# Patient Record
Sex: Female | Born: 1960
Health system: Southern US, Community
[De-identification: ages and names within clinical notes are randomized; demographics above are authoritative.]

## PROBLEM LIST (undated history)

## (undated) ENCOUNTER — Emergency Department (HOSPITAL_COMMUNITY): Discharge status: Absent without leave | Payer: Medicare Other

## (undated) DIAGNOSIS — R112 Nausea with vomiting, unspecified: Secondary | ICD-10-CM

## (undated) DIAGNOSIS — J189 Pneumonia, unspecified organism: Secondary | ICD-10-CM

## (undated) DIAGNOSIS — F32A Depression, unspecified: Secondary | ICD-10-CM

## (undated) DIAGNOSIS — E7800 Pure hypercholesterolemia, unspecified: Secondary | ICD-10-CM

## (undated) DIAGNOSIS — M1990 Unspecified osteoarthritis, unspecified site: Secondary | ICD-10-CM

## (undated) DIAGNOSIS — F419 Anxiety disorder, unspecified: Secondary | ICD-10-CM

## (undated) DIAGNOSIS — D649 Anemia, unspecified: Secondary | ICD-10-CM

## (undated) DIAGNOSIS — I10 Essential (primary) hypertension: Secondary | ICD-10-CM

## (undated) DIAGNOSIS — G8929 Other chronic pain: Secondary | ICD-10-CM

## (undated) DIAGNOSIS — K219 Gastro-esophageal reflux disease without esophagitis: Secondary | ICD-10-CM

## (undated) DIAGNOSIS — F329 Major depressive disorder, single episode, unspecified: Secondary | ICD-10-CM

## (undated) DIAGNOSIS — D860 Sarcoidosis of lung: Secondary | ICD-10-CM

## (undated) HISTORY — PX: ABDOMINAL HYSTERECTOMY: SHX81

## (undated) HISTORY — DX: Anxiety disorder, unspecified: F41.9

## (undated) HISTORY — DX: Pure hypercholesterolemia, unspecified: E78.00

## (undated) HISTORY — DX: Gastro-esophageal reflux disease without esophagitis: K21.9

## (undated) HISTORY — PX: CHOLECYSTECTOMY: SHX55

## (undated) HISTORY — PX: HERNIA REPAIR: SHX51

## (undated) HISTORY — DX: Pneumonia, unspecified organism: J18.9

## (undated) HISTORY — PX: SMALL INTESTINE SURGERY: SHX150

## (undated) HISTORY — DX: Sarcoidosis of lung: D86.0

## (undated) HISTORY — PX: APPENDECTOMY: SHX54

---
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
## 2001-01-19 ENCOUNTER — Emergency Department (HOSPITAL_COMMUNITY): Admission: EM | Admit: 2001-01-19 | Discharge: 2001-01-19 | Payer: Self-pay | Admitting: *Deleted

## 2001-04-17 ENCOUNTER — Emergency Department (HOSPITAL_COMMUNITY): Admission: EM | Admit: 2001-04-17 | Discharge: 2001-04-17 | Payer: Self-pay | Admitting: *Deleted

## 2001-04-21 ENCOUNTER — Emergency Department (HOSPITAL_COMMUNITY): Admission: EM | Admit: 2001-04-21 | Discharge: 2001-04-21 | Payer: Self-pay | Admitting: Emergency Medicine

## 2001-04-27 ENCOUNTER — Emergency Department (HOSPITAL_COMMUNITY): Admission: EM | Admit: 2001-04-27 | Discharge: 2001-04-27 | Payer: Self-pay | Admitting: *Deleted

## 2001-05-18 ENCOUNTER — Emergency Department (HOSPITAL_COMMUNITY): Admission: EM | Admit: 2001-05-18 | Discharge: 2001-05-18 | Payer: Self-pay | Admitting: Emergency Medicine

## 2001-05-28 ENCOUNTER — Encounter: Payer: Self-pay | Admitting: Internal Medicine

## 2001-05-28 ENCOUNTER — Emergency Department (HOSPITAL_COMMUNITY): Admission: EM | Admit: 2001-05-28 | Discharge: 2001-05-28 | Payer: Self-pay | Admitting: Internal Medicine

## 2001-06-08 ENCOUNTER — Emergency Department (HOSPITAL_COMMUNITY): Admission: EM | Admit: 2001-06-08 | Discharge: 2001-06-08 | Payer: Self-pay | Admitting: Emergency Medicine

## 2001-07-15 ENCOUNTER — Emergency Department (HOSPITAL_COMMUNITY): Admission: EM | Admit: 2001-07-15 | Discharge: 2001-07-15 | Payer: Self-pay | Admitting: Emergency Medicine

## 2001-08-14 ENCOUNTER — Emergency Department (HOSPITAL_COMMUNITY): Admission: EM | Admit: 2001-08-14 | Discharge: 2001-08-14 | Payer: Self-pay | Admitting: Emergency Medicine

## 2001-09-21 ENCOUNTER — Encounter: Payer: Self-pay | Admitting: Emergency Medicine

## 2001-09-21 ENCOUNTER — Emergency Department (HOSPITAL_COMMUNITY): Admission: EM | Admit: 2001-09-21 | Discharge: 2001-09-21 | Payer: Self-pay | Admitting: Emergency Medicine

## 2002-06-15 ENCOUNTER — Emergency Department (HOSPITAL_COMMUNITY): Admission: EM | Admit: 2002-06-15 | Discharge: 2002-06-15 | Payer: Self-pay | Admitting: *Deleted

## 2002-06-15 ENCOUNTER — Encounter: Payer: Self-pay | Admitting: *Deleted

## 2002-06-23 ENCOUNTER — Emergency Department (HOSPITAL_COMMUNITY): Admission: EM | Admit: 2002-06-23 | Discharge: 2002-06-23 | Payer: Self-pay | Admitting: Emergency Medicine

## 2002-06-23 ENCOUNTER — Encounter: Payer: Self-pay | Admitting: Emergency Medicine

## 2002-07-01 ENCOUNTER — Emergency Department (HOSPITAL_COMMUNITY): Admission: EM | Admit: 2002-07-01 | Discharge: 2002-07-01 | Payer: Self-pay | Admitting: Emergency Medicine

## 2002-07-02 ENCOUNTER — Emergency Department (HOSPITAL_COMMUNITY): Admission: EM | Admit: 2002-07-02 | Discharge: 2002-07-02 | Payer: Self-pay | Admitting: Emergency Medicine

## 2002-07-02 ENCOUNTER — Encounter: Payer: Self-pay | Admitting: Emergency Medicine

## 2002-08-16 ENCOUNTER — Encounter: Payer: Self-pay | Admitting: Emergency Medicine

## 2002-08-16 ENCOUNTER — Emergency Department (HOSPITAL_COMMUNITY): Admission: EM | Admit: 2002-08-16 | Discharge: 2002-08-16 | Payer: Self-pay | Admitting: Emergency Medicine

## 2002-09-20 ENCOUNTER — Encounter (HOSPITAL_COMMUNITY): Admission: RE | Admit: 2002-09-20 | Discharge: 2002-10-20 | Payer: Self-pay | Admitting: Pulmonary Disease

## 2002-12-24 ENCOUNTER — Emergency Department (HOSPITAL_COMMUNITY): Admission: EM | Admit: 2002-12-24 | Discharge: 2002-12-24 | Payer: Self-pay | Admitting: Emergency Medicine

## 2002-12-24 ENCOUNTER — Encounter: Payer: Self-pay | Admitting: Emergency Medicine

## 2003-06-03 ENCOUNTER — Emergency Department (HOSPITAL_COMMUNITY): Admission: EM | Admit: 2003-06-03 | Discharge: 2003-06-03 | Payer: Self-pay | Admitting: Emergency Medicine

## 2003-06-23 ENCOUNTER — Ambulatory Visit (HOSPITAL_COMMUNITY): Admission: RE | Admit: 2003-06-23 | Discharge: 2003-06-23 | Payer: Self-pay | Admitting: Obstetrics and Gynecology

## 2003-07-08 ENCOUNTER — Ambulatory Visit (HOSPITAL_COMMUNITY): Admission: RE | Admit: 2003-07-08 | Discharge: 2003-07-08 | Payer: Self-pay | Admitting: Obstetrics and Gynecology

## 2003-08-22 ENCOUNTER — Emergency Department (HOSPITAL_COMMUNITY): Admission: EM | Admit: 2003-08-22 | Discharge: 2003-08-23 | Payer: Self-pay | Admitting: *Deleted

## 2003-09-07 ENCOUNTER — Emergency Department (HOSPITAL_COMMUNITY): Admission: EM | Admit: 2003-09-07 | Discharge: 2003-09-07 | Payer: Self-pay | Admitting: Emergency Medicine

## 2003-10-30 ENCOUNTER — Emergency Department (HOSPITAL_COMMUNITY): Admission: EM | Admit: 2003-10-30 | Discharge: 2003-10-30 | Payer: Self-pay | Admitting: Emergency Medicine

## 2003-12-15 ENCOUNTER — Ambulatory Visit (HOSPITAL_COMMUNITY): Admission: RE | Admit: 2003-12-15 | Discharge: 2003-12-15 | Payer: Self-pay | Admitting: Pulmonary Disease

## 2003-12-26 ENCOUNTER — Ambulatory Visit (HOSPITAL_COMMUNITY): Admission: RE | Admit: 2003-12-26 | Discharge: 2003-12-26 | Payer: Self-pay | Admitting: Pulmonary Disease

## 2004-03-31 ENCOUNTER — Emergency Department (HOSPITAL_COMMUNITY): Admission: EM | Admit: 2004-03-31 | Discharge: 2004-03-31 | Payer: Self-pay | Admitting: Emergency Medicine

## 2004-06-16 ENCOUNTER — Emergency Department (HOSPITAL_COMMUNITY): Admission: EM | Admit: 2004-06-16 | Discharge: 2004-06-16 | Payer: Self-pay | Admitting: Emergency Medicine

## 2004-06-17 ENCOUNTER — Emergency Department (HOSPITAL_COMMUNITY): Admission: EM | Admit: 2004-06-17 | Discharge: 2004-06-17 | Payer: Self-pay | Admitting: Emergency Medicine

## 2004-07-09 ENCOUNTER — Emergency Department (HOSPITAL_COMMUNITY): Admission: EM | Admit: 2004-07-09 | Discharge: 2004-07-09 | Payer: Self-pay | Admitting: Emergency Medicine

## 2004-07-20 ENCOUNTER — Inpatient Hospital Stay (HOSPITAL_COMMUNITY): Admission: EM | Admit: 2004-07-20 | Discharge: 2004-07-23 | Payer: Self-pay | Admitting: Emergency Medicine

## 2004-08-13 ENCOUNTER — Emergency Department (HOSPITAL_COMMUNITY): Admission: EM | Admit: 2004-08-13 | Discharge: 2004-08-13 | Payer: Self-pay | Admitting: Emergency Medicine

## 2004-11-09 ENCOUNTER — Emergency Department (HOSPITAL_COMMUNITY): Admission: EM | Admit: 2004-11-09 | Discharge: 2004-11-09 | Payer: Self-pay | Admitting: *Deleted

## 2005-01-08 ENCOUNTER — Emergency Department (HOSPITAL_COMMUNITY): Admission: EM | Admit: 2005-01-08 | Discharge: 2005-01-09 | Payer: Self-pay | Admitting: Emergency Medicine

## 2005-01-09 ENCOUNTER — Emergency Department (HOSPITAL_COMMUNITY): Admission: EM | Admit: 2005-01-09 | Discharge: 2005-01-09 | Payer: Self-pay | Admitting: *Deleted

## 2005-10-26 ENCOUNTER — Emergency Department (HOSPITAL_COMMUNITY): Admission: EM | Admit: 2005-10-26 | Discharge: 2005-10-26 | Payer: Self-pay | Admitting: Emergency Medicine

## 2006-03-03 ENCOUNTER — Emergency Department (HOSPITAL_COMMUNITY): Admission: EM | Admit: 2006-03-03 | Discharge: 2006-03-03 | Payer: Self-pay | Admitting: Emergency Medicine

## 2006-03-04 ENCOUNTER — Inpatient Hospital Stay (HOSPITAL_COMMUNITY): Admission: EM | Admit: 2006-03-04 | Discharge: 2006-03-09 | Payer: Self-pay | Admitting: Emergency Medicine

## 2006-12-12 ENCOUNTER — Ambulatory Visit (HOSPITAL_COMMUNITY): Admission: RE | Admit: 2006-12-12 | Discharge: 2006-12-12 | Payer: Self-pay | Admitting: Pulmonary Disease

## 2006-12-25 ENCOUNTER — Emergency Department (HOSPITAL_COMMUNITY): Admission: EM | Admit: 2006-12-25 | Discharge: 2006-12-25 | Payer: Self-pay | Admitting: Emergency Medicine

## 2007-03-29 ENCOUNTER — Emergency Department (HOSPITAL_COMMUNITY): Admission: EM | Admit: 2007-03-29 | Discharge: 2007-03-29 | Payer: Self-pay | Admitting: Emergency Medicine

## 2007-04-13 ENCOUNTER — Emergency Department (HOSPITAL_COMMUNITY): Admission: EM | Admit: 2007-04-13 | Discharge: 2007-04-13 | Payer: Self-pay | Admitting: Emergency Medicine

## 2007-04-18 ENCOUNTER — Emergency Department (HOSPITAL_COMMUNITY): Admission: EM | Admit: 2007-04-18 | Discharge: 2007-04-18 | Payer: Self-pay | Admitting: Emergency Medicine

## 2007-09-02 ENCOUNTER — Emergency Department (HOSPITAL_COMMUNITY): Admission: EM | Admit: 2007-09-02 | Discharge: 2007-09-02 | Payer: Self-pay | Admitting: Emergency Medicine

## 2007-09-21 ENCOUNTER — Emergency Department (HOSPITAL_COMMUNITY): Admission: EM | Admit: 2007-09-21 | Discharge: 2007-09-21 | Payer: Self-pay | Admitting: Emergency Medicine

## 2009-04-03 ENCOUNTER — Encounter (INDEPENDENT_AMBULATORY_CARE_PROVIDER_SITE_OTHER): Payer: Self-pay | Admitting: *Deleted

## 2009-04-03 LAB — CONVERTED CEMR LAB
ALT: 13 units/L
AST: 17 units/L
Bilirubin, Direct: 0.1 mg/dL
Cholesterol: 230 mg/dL
Creatinine, Ser: 0.59 mg/dL
HDL: 35 mg/dL
LDL Cholesterol: 155 mg/dL

## 2009-06-23 DIAGNOSIS — J189 Pneumonia, unspecified organism: Secondary | ICD-10-CM | POA: Insufficient documentation

## 2009-06-23 DIAGNOSIS — R079 Chest pain, unspecified: Secondary | ICD-10-CM | POA: Insufficient documentation

## 2009-06-23 DIAGNOSIS — D869 Sarcoidosis, unspecified: Secondary | ICD-10-CM | POA: Insufficient documentation

## 2009-06-26 ENCOUNTER — Ambulatory Visit: Payer: Self-pay | Admitting: Cardiology

## 2009-06-26 ENCOUNTER — Encounter (INDEPENDENT_AMBULATORY_CARE_PROVIDER_SITE_OTHER): Payer: Self-pay | Admitting: *Deleted

## 2009-06-26 DIAGNOSIS — K219 Gastro-esophageal reflux disease without esophagitis: Secondary | ICD-10-CM | POA: Insufficient documentation

## 2009-07-05 ENCOUNTER — Ambulatory Visit: Payer: Self-pay | Admitting: Cardiology

## 2009-07-07 ENCOUNTER — Encounter: Payer: Self-pay | Admitting: Cardiology

## 2009-07-07 ENCOUNTER — Ambulatory Visit: Payer: Self-pay | Admitting: Cardiology

## 2009-07-07 ENCOUNTER — Ambulatory Visit (HOSPITAL_COMMUNITY): Admission: RE | Admit: 2009-07-07 | Discharge: 2009-07-07 | Payer: Self-pay | Admitting: Cardiology

## 2009-07-11 ENCOUNTER — Encounter (INDEPENDENT_AMBULATORY_CARE_PROVIDER_SITE_OTHER): Payer: Self-pay | Admitting: *Deleted

## 2009-08-14 ENCOUNTER — Emergency Department (HOSPITAL_COMMUNITY): Admission: EM | Admit: 2009-08-14 | Discharge: 2009-08-14 | Payer: Self-pay | Admitting: Emergency Medicine

## 2009-11-09 ENCOUNTER — Encounter (HOSPITAL_COMMUNITY)
Admission: RE | Admit: 2009-11-09 | Discharge: 2009-12-09 | Payer: Self-pay | Source: Home / Self Care | Admitting: Pulmonary Disease

## 2010-01-16 ENCOUNTER — Emergency Department (HOSPITAL_COMMUNITY): Admission: EM | Admit: 2010-01-16 | Discharge: 2010-01-17 | Payer: Self-pay | Admitting: Emergency Medicine

## 2010-04-07 ENCOUNTER — Emergency Department (HOSPITAL_COMMUNITY)
Admission: EM | Admit: 2010-04-07 | Discharge: 2010-04-07 | Payer: Self-pay | Source: Home / Self Care | Admitting: Emergency Medicine

## 2010-05-13 ENCOUNTER — Observation Stay (HOSPITAL_COMMUNITY): Admission: EM | Admit: 2010-05-13 | Discharge: 2010-05-15 | Payer: Self-pay | Admitting: Emergency Medicine

## 2010-09-02 ENCOUNTER — Encounter: Payer: Self-pay | Admitting: Pulmonary Disease

## 2010-10-24 LAB — BASIC METABOLIC PANEL
BUN: 4 mg/dL — ABNORMAL LOW (ref 6–23)
Chloride: 102 mEq/L (ref 96–112)
Potassium: 3.5 mEq/L (ref 3.5–5.1)
Sodium: 134 mEq/L — ABNORMAL LOW (ref 135–145)

## 2010-10-24 LAB — URINE CULTURE
Colony Count: 75000
Culture  Setup Time: 201110030322
Special Requests: NEGATIVE

## 2010-10-24 LAB — URINALYSIS, ROUTINE W REFLEX MICROSCOPIC
Bilirubin Urine: NEGATIVE
Glucose, UA: NEGATIVE mg/dL
Ketones, ur: NEGATIVE mg/dL
pH: 6.5 (ref 5.0–8.0)

## 2010-10-24 LAB — DIFFERENTIAL
Basophils Absolute: 0.5 10*3/uL — ABNORMAL HIGH (ref 0.0–0.1)
Basophils Relative: 0 % (ref 0–1)
Eosinophils Absolute: 3.4 10*3/uL — ABNORMAL HIGH (ref 0.0–0.7)
Lymphocytes Relative: 22 % (ref 12–46)
Monocytes Absolute: 11.3 10*3/uL — ABNORMAL HIGH (ref 0.1–1.0)
Neutrophils Relative %: 63 % (ref 43–77)

## 2010-10-24 LAB — CULTURE, BLOOD (ROUTINE X 2)
Culture: NO GROWTH
Culture: NO GROWTH
Report Status: 10082011
Report Status: 10082011

## 2010-10-24 LAB — ANA: Anti Nuclear Antibody(ANA): NEGATIVE

## 2010-10-24 LAB — CBC
HCT: 34.7 % — ABNORMAL LOW (ref 36.0–46.0)
Hemoglobin: 11.8 g/dL — ABNORMAL LOW (ref 12.0–15.0)
MCV: 84.1 fL (ref 78.0–100.0)
RDW: 15.2 % (ref 11.5–15.5)
WBC: 3.9 10*3/uL — ABNORMAL LOW (ref 4.0–10.5)

## 2010-10-25 LAB — BASIC METABOLIC PANEL
BUN: 7 mg/dL (ref 6–23)
Creatinine, Ser: 0.68 mg/dL (ref 0.4–1.2)
GFR calc non Af Amer: >60 mL/min (ref >60)
Glucose, Bld: 109 mg/dL — ABNORMAL HIGH (ref 70–99)

## 2010-10-25 LAB — DIFFERENTIAL
Basophils Absolute: 0 10*3/uL (ref 0.0–0.1)
Basophils Relative: 0 % (ref 0–1)
Eosinophils Relative: 5 % (ref 0–5)
Lymphocytes Relative: 20 % (ref 12–46)
Monocytes Absolute: 0.4 10*3/uL (ref 0.1–1.0)
Neutro Abs: 3.2 10*3/uL (ref 1.7–7.7)

## 2010-10-25 LAB — CBC
HCT: 37.2 % (ref 36.0–46.0)
MCHC: 33 g/dL (ref 30.0–36.0)
MCV: 83.9 fL (ref 78.0–100.0)
Platelets: 238 10*3/uL (ref 150–400)
RDW: 14.4 % (ref 11.5–15.5)
WBC: 4.8 10*3/uL (ref 4.0–10.5)

## 2010-10-25 LAB — POCT CARDIAC MARKERS: Troponin i, poc: <0.05 ng/mL (ref 0.00–0.09)

## 2010-10-27 LAB — DIFFERENTIAL
Eosinophils Absolute: 0.1 10*3/uL (ref 0.0–0.7)
Eosinophils Relative: 1 % (ref 0–5)
Lymphs Abs: 0.4 10*3/uL — ABNORMAL LOW (ref 0.7–4.0)
Monocytes Relative: 4 % (ref 3–12)
Neutrophils Relative %: 90 % — ABNORMAL HIGH (ref 43–77)

## 2010-10-27 LAB — COMPREHENSIVE METABOLIC PANEL
ALT: 43 U/L — ABNORMAL HIGH (ref 0–35)
AST: 44 U/L — ABNORMAL HIGH (ref 0–37)
Albumin: 4.2 g/dL (ref 3.5–5.2)
Calcium: 10.2 mg/dL (ref 8.4–10.5)
GFR calc Af Amer: >60 mL/min (ref >60)
Sodium: 140 mEq/L (ref 135–145)
Total Protein: 9.8 g/dL — ABNORMAL HIGH (ref 6.0–8.3)

## 2010-10-27 LAB — PREGNANCY, URINE: Preg Test, Ur: NEGATIVE

## 2010-10-27 LAB — URINALYSIS, ROUTINE W REFLEX MICROSCOPIC
Bilirubin Urine: NEGATIVE
Nitrite: NEGATIVE
Specific Gravity, Urine: 1.025 (ref 1.005–1.030)
pH: 5 (ref 5.0–8.0)

## 2010-10-27 LAB — CBC
MCHC: 33.3 g/dL (ref 30.0–36.0)
Platelets: 286 10*3/uL (ref 150–400)
RDW: 15.5 % (ref 11.5–15.5)

## 2010-10-29 LAB — BASIC METABOLIC PANEL
CO2: 26 mEq/L (ref 19–32)
Calcium: 9.2 mg/dL (ref 8.4–10.5)
GFR calc Af Amer: >60 mL/min (ref >60)
GFR calc non Af Amer: >60 mL/min (ref >60)
Sodium: 140 mEq/L (ref 135–145)

## 2010-10-29 LAB — URINALYSIS, ROUTINE W REFLEX MICROSCOPIC
Hgb urine dipstick: NEGATIVE
Nitrite: NEGATIVE
Specific Gravity, Urine: 1.02 (ref 1.005–1.030)
Urobilinogen, UA: 1 mg/dL (ref 0.0–1.0)

## 2010-10-29 LAB — CBC
Hemoglobin: 11.9 g/dL — ABNORMAL LOW (ref 12.0–15.0)
RBC: 4.18 MIL/uL (ref 3.87–5.11)
WBC: 4.2 10*3/uL (ref 4.0–10.5)

## 2010-10-29 LAB — DIFFERENTIAL
Lymphocytes Relative: 23 % (ref 12–46)
Lymphs Abs: 1 10*3/uL (ref 0.7–4.0)
Monocytes Absolute: 0.4 10*3/uL (ref 0.1–1.0)
Monocytes Relative: 10 % (ref 3–12)
Neutro Abs: 2.6 10*3/uL (ref 1.7–7.7)

## 2010-12-28 NOTE — Group Therapy Note (Signed)
Allison, Chase             ACCOUNT NO.:  [id number]   MEDICAL RECORD NO.:  [id number]          PATIENT TYPE:  INP   LOCATION:  A206                          FACILITY:  [facility]   PHYSICIAN:  Edward L. Juanetta Gosling, M.D.DATE OF BIRTH:  10-11-1960   DATE OF PROCEDURE:  DATE OF DISCHARGE:                                   PROGRESS NOTE   PROBLEM:  Pneumonia, sarcoidosis.   SUBJECTIVE:  [name] says she feels a little better.  She is still  congested, still has significant chest pain but the chest pain has been in  part of her syndrome ever since she started.  This has been since her  diagnosis of sarcoidosis.   PHYSICAL EXAMINATION:  GENERAL:  Her physical exam today shows she looks  more comfortable.  VITAL SIGNS:  Temperature is 98.2, pulse 101, respirations 20, blood  pressure 158/99, O2 sat is 99% on 1 liter.  CHEST:  Much clearer but she has still got complaints of pain.  HEART:  Regular.  ABDOMEN:  Soft.   ASSESSMENT:  She is better.   PLAN:  I am going to discontinue her IV fluids, get her up and moving more.  She should be ready for discharge home tomorrow.      Edward L. Juanetta Gosling, M.D.  Electronically Signed     ELH/MEDQ  D:  03/08/2006  T:  03/08/2006  Job:  [id number]

## 2010-12-28 NOTE — Group Therapy Note (Signed)
[name], [name]             ACCOUNT NO.:  [id number]   MEDICAL RECORD NO.:  [id number]          PATIENT TYPE:  INP   LOCATION:  A206                          FACILITY:  [facility]   PHYSICIAN:  Edward L. Juanetta Gosling, M.D.DATE OF BIRTH:  08-Apr-1961   DATE OF PROCEDURE:  DATE OF DISCHARGE:                                   PROGRESS NOTE   PROBLEM:  Pneumonia, sarcoidosis.   SUBJECTIVE:  [name] was admitted last night with pneumonia which is  bilateral, and sarcoidosis.  She had had a previous ER visit about 24 hours  prior.   Her exam this morning shows that she is awake and alert.  She does not  appear to be in acute distress.  Temperature is 96.9, pulse 80, respirations  22, blood pressure 143/73, O2 saturation listed at 96% on room air but 97%  on 2 L; I am not sure what which one is accurate.  White blood count was  4700, hemoglobin 11.3.  D-dimer was 1.48.  Electrolytes:  Sodium was 133,  BUN 5, creatinine 0.8.  cardiac enzymes were negative.  Her chest is  relatively clear.  Her heart is regular without gallop.  Her abdomen soft.   ASSESSMENT:  She has sarcoidosis and pneumonia.   PLAN:  Continue with treatments.  I want to check and make sure that she did  have a CT chest done to check out the D-dimer elevation.   ADDENDUM:  She did have CT angiogram done on July 23 which showed no  pulmonary emboli, stable enlarged mediastinal lymph nodes, scattered  atelectasis.      Edward L. Juanetta Gosling, M.D.  Electronically Signed     ELH/MEDQ  D:  03/05/2006  T:  03/05/2006  Job:  [id number]

## 2010-12-28 NOTE — Op Note (Signed)
NAMELACHRISHA, [name]             ACCOUNT NO.:  [id number]   MEDICAL RECORD NO.:  [id number]          PATIENT TYPE:  INP   LOCATION:  A337                          FACILITY:  [facility]   PHYSICIAN:  Dirk Dress. Katrinka Blazing, M.D.   DATE OF BIRTH:  10-Jul-1961   DATE OF PROCEDURE:  07/20/2004  DATE OF DISCHARGE:                                 OPERATIVE REPORT   PREOPERATIVE DIAGNOSIS:  Cholelithiasis, cholecystitis.   POSTOPERATIVE DIAGNOSIS:  Cholelithiasis, cholecystitis.   PROCEDURE:  Laparoscopic cholecystectomy.   SURGEON:  Dr. Katrinka Blazing.   DESCRIPTION:  Under general endotracheal anesthesia, the patient's abdomen  was prepped and draped in sterile field. Supraumbilical incision was made.  Veress needle was inserted uneventfully. Abdomen was insufflated with 2.5  liters of CO2. Using a Visiport guide, a 10-mm port was placed. Laparoscope  was placed, and the gallbladder was visualized. The patient was placed in  reversed Trendelenburg position. Under videoscopic guidance, a 10-mm port  and two 5-mm ports were placed in the right subcostal region. The  gallbladder was grasped and positioned. Cystic duct was dissected along with  the cystic artery branches. There was an anterior and posterior cystic  artery branch. The anterior cystic artery branch was dissected back to the  gallbladder, clipped with 3 clips and divided. Cystic duct was then  dissected back to the gallbladder, clipped with 5 clips and divided. The  posterior cystic artery branch was dissected close to the gallbladder,  clipped with 3 clips and divided. The gallbladder was then placed on  _______________. Using electrocautery, gallbladder was separated from the  intrahepatic space without difficulty. The gallbladder was placed in an  EndoCatch device and retrieved intact. There were multiple gallstones.  Irrigation was carried out. There was essentially no bleeding. Hemostasis in  the bed was totally normal. There was no evidence  of bile leak. Half liter  of irrigation was carried, and the fluid was totally clear. CO2 was allowed  to escape from the abdomen, and the ports were removed. The incisions were  closed using 0 Vicryl on the supraumbilical incision and staples on the  skin. Dressings were placed. She was awakened from anesthesia uneventfully,  transferred to a bed, and taken to the post anesthetic care unit for further  monitoring.     Lero  LCS/MEDQ  D:  07/20/2004  T:  07/20/2004  Job:  [id number]   cc:   Ramon Dredge L. Juanetta Gosling, M.D.  [address]  [place]  [place] [address]  Fax: [phone]

## 2010-12-28 NOTE — Discharge Summary (Signed)
[name], [name]             ACCOUNT NO.:  [id number]   MEDICAL RECORD NO.:  [id number]          PATIENT TYPE:  INP   LOCATION:  A206                          FACILITY:  [facility]   PHYSICIAN:  Mila Homer. Sudie Bailey, M.D.DATE OF BIRTH:  12/02/1960   DATE OF ADMISSION:  03/04/2006  DATE OF DISCHARGE:  07/29/2007LH                                 DISCHARGE SUMMARY   HISTORY OF PRESENT ILLNESS:  This 50 year old was admitted to the hospital  with bilateral pneumonia and sarcoidosis.   HOSPITAL COURSE:  She had a fairly benign 6-day hospital course extending  from March 04, 2006 through March 09, 2006.  Vital signs remained stable.   Her admission white cell count was 6000.  There were 68% neutrophils, 19  lymphs, 9 monos.  Hemoglobin and hematocrit of 11.8 and 34.3.  Rechecked the  following day, the white count was 4700 with 59% neutrophils, 28 lymphs, 10  monos.  Hemoglobin and hematocrit was still low at 11.3 and 32.3.  Her  admission CMP showed a glucose of 108, total protein 8.4, and otherwise was  normal.  D-dimer was 1.48.  (Normal values 0-0.48).  Cardiac markers were  normal, and rechecked BMP showed a sodium of 133, BUN 5.   Her admission portable chest showed cardiomegaly without congestive heart  failure and bibasilar atelectasis.  She had a CT angiogram of the chest,  given her elevated D-dimer, and this showed no evidence of pulmonary  embolism but scattered atelectasis and scarring in both lower lobes, and  stable mildly enlarged mediastinal lymph nodes.  Recheck chest x-ray showed  worsened lower infiltrates consistent with pneumonia by the following day.   She was admitted to the hospital.  She was put on an IV at half normal  saline KVO.  Vital signs q.i.d.  Rocephin 1 gm IV daily, Zithromax 5 mg IV  daily, and Dilaudid 2 mg IV q.3-4h. for pain, Elavil 50 mg q.h.s. p.r.n.,  prednisone 60 mg daily.  She was given Benadryl 25 mg q.4h. p.r.n. itching.  Rocephin was  discontinued, and she was put on Levaquin 5 mg IV daily,  Humibid LA two b.i.d., Tylox for pain.  She received ibuprofen 800 mg q.8h.,  Lovenox 40 mg b.i.d. prophylactically, prednisone 40 mg daily by her third  day.  By her fifth day, her IV was discontinued and she was put on Heplock.  Ibuprofen had already been discontinued.   The patient did well with this regimen.  She was much improved by the time  of discharge.  She still had some pains in the anterior chest extending  around to the left upper quadrant, but these are the ones she has had ever  since she has had sarcoidosis.   FINAL DISCHARGE DIAGNOSES:  1. Pneumonia.  2. Sarcoidosis.  3. Chronic chest pain.   DISCHARGE MEDICATIONS:  1. She was discharged and to presume her prior home medicines.  2. She is to take Levaquin 5 mg daily for 7 days.   FOLLOWUP:  She is to followup with Dr. Juanetta Gosling.      Mila Homer. Sudie Bailey, M.D.  Electronically Signed     SDK/MEDQ  D:  03/09/2006  T:  03/09/2006  Job:  [id number]   cc:   Ramon Dredge L. Juanetta Gosling, M.D.  Fax: [phone]

## 2010-12-28 NOTE — Discharge Summary (Signed)
[name], [name]             ACCOUNT NO.:  [id number]   MEDICAL RECORD NO.:  [id number]          PATIENT TYPE:  INP   LOCATION:  A337                          FACILITY:  [facility]   PHYSICIAN:  Dirk Dress. Katrinka Blazing, M.D.   DATE OF BIRTH:  18-Nov-1960   DATE OF ADMISSION:  07/20/2004  DATE OF DISCHARGE:  12/12/2005LH                                 DISCHARGE SUMMARY   DISCHARGE DIAGNOSES:  1.  Cholelithiasis/cholecystitis.  2.  Sarcoidosis.  3.  Gastroesophageal reflux disease.  4.  Hypokalemia.   SPECIAL PROCEDURE:  Laparoscopic cholecystectomy 9 December.   DISPOSITION:  The patient discharged home in a stable, satisfactory  condition.  She will have follow up in the office in 2 weeks.   DISCHARGE MEDICATIONS:  1.  Vicodin 10/500 q.i.d. p.r.n.  2.  Phenergan 25 mg q.4h. p.r.n.   SUMMARY:  A 50 year old female with history of acute onset of severe  abdominal pain early on the morning of admission.  She had associated  nausea, vomiting.  She had constant pain while in the emergency room despite  IV Demerol and Phenergan.  Ultrasound was done, and this showed multiple  gallstones.  The patient was admitted to have cholecystectomy.  Past history  is positive for sarcoidosis, gastroesophageal reflux disease, and  hypokalemia.   PHYSICAL EXAMINATION:  VITAL SIGNS:  She was afebrile.  Blood pressure  126/78, pulse 74, respirations 24.  LUNGS/HEART:  Normal.  ABDOMEN:  Moderate tenderness in the epigastric and right upper quadrant.  No masses.   White count 5700, hemoglobin 11.1, hematocrit 32.7, potassium 2.6.  liver  function studies were normal including SGOT, SGPT, alkaline phosphatase,  bilirubin, and albumin.  The patient was started on IV potassium, and plans  were made for cholecystectomy later during the day.  Immediate preoperative  potassium was 4.1.  Potassium prior to discharge was 4.6.  The patient was  scheduled for laparoscopic cholecystectomy, and this was done on the  day of  admission in the early afternoon.  She tolerated this well.  In the  postoperative period, she had mild ileus which was treated with milk-of-  magnesia.  She had some vomiting on the 2nd postoperative day.  It was felt  that this was  related to Tylox.  Tylox was discontinued, and the nausea and vomiting  resolved.  By the 12th, she was stable.  She had only mild incisional  discomfort.  She was tolerating a diet.  She was afebrile, and her incisions  looked good.  She was therefore discharged home with plans for follow up in  the office 2 weeks postdischarge.     Lero   LCS/MEDQ  D:  08/27/2004  T:  08/27/2004  Job:  [id number]

## 2010-12-28 NOTE — Group Therapy Note (Signed)
NAMEYARITZY, HUSER             ACCOUNT NO.:  [id number]   MEDICAL RECORD NO.:  [id number]          PATIENT TYPE:  INP   LOCATION:  A206                          FACILITY:  [facility]   PHYSICIAN:  Edward L. Juanetta Gosling, M.D.DATE OF BIRTH:  Mar 22, 1961   DATE OF PROCEDURE:  DATE OF DISCHARGE:                                   PROGRESS NOTE   Ms. Boline is, I think, a little better.  She says she has a headache and  she has some chest pain still.  She says she does not feel any better.  She  looks a little better today.   PHYSICAL EXAMINATION:  VITAL SIGNS:  Her exam shows her temperature is 97.4,  pulse is 90, respirations 20, blood pressure 105/58, O2 sats 100% on 2  liters.  CHEST:  Her chest, clearer than it was.  She still has slight somewhat  decreased breath sounds.   ASSESSMENT:  She is still having significant symptoms, so I am going to go  ahead and put her on intravenous steroids times 24 hours.  I am going to  give her some ibuprofen for her, mostly, chest wall pain and go ahead and  give her Protonix for prophylaxis; and she is not on prophylaxis for deep  venous thrombosis either, I am going to start that.      Edward L. Juanetta Gosling, M.D.  Electronically Signed     ELH/MEDQ  D:  03/07/2006  T:  03/07/2006  Job:  [id number]

## 2010-12-28 NOTE — Group Therapy Note (Signed)
[name], [name]             ACCOUNT NO.:  [id number]   MEDICAL RECORD NO.:  [id number]          PATIENT TYPE:  INP   LOCATION:  A206                          FACILITY:  [facility]   PHYSICIAN:  Edward L. Juanetta Gosling, M.D.DATE OF BIRTH:  [birth year]   DATE OF PROCEDURE:  03/06/2006  DATE OF DISCHARGE:                                   PROGRESS NOTE   PROBLEM:  Pneumonia, sarcoidosis.   SUBJECTIVE:  Ms. Hunton says she is feeling a little better.  Yesterday,  her family told me that she was not taking her medication, but she says she  is.  The medicine particularly being discussed was her prednisone.  She is  otherwise doing well.  She has no other new complaints.  She is still  coughing but not bringing anything up.   OBJECTIVE:  VITAL SIGNS:  Her exam shows temperature is 99.4, pulse 90,  respirations 20, blood pressure 133/75, O2 saturation 98% on 2 liters.  CHEST:  Her chest is clearer.  HEART:  Her heart is regular.  She looks a little better.   ASSESSMENT:  She is slowly improving.   PLAN:  Try to get her up and moving around a little bit today.  Continue  with all her other medications and follow.      Edward L. Juanetta Gosling, M.D.  Electronically Signed     ELH/MEDQ  D:  03/06/2006  T:  03/06/2006  Job:  [id number]

## 2010-12-28 NOTE — H&P (Signed)
[name], [name]             ACCOUNT NO.:  [id number]   MEDICAL RECORD NO.:  [id number]          PATIENT TYPE:  INP   LOCATION:  A206                          FACILITY:  [facility]   PHYSICIAN:  Angus G. Renard Matter, MD   DATE OF BIRTH:  1960/12/21   DATE OF ADMISSION:  03/04/2006  DATE OF DISCHARGE:  LH                                HISTORY & PHYSICAL   HISTORY OF PRESENT ILLNESS:  This 50 year old African-American female  presented to the emergency room with left-sided chest pain which has been  present over a period of several hours.  The patient apparently had been  seen in the ED yesterday and had x-rays done at that time which apparently  failed to show any significant pneumonia. She was evaluated by the ED  physician on this occasion and x-ray today shows bilateral infiltrate  compatible with pneumonia. The patient does have a history of sarcoidosis as  well.  Her O2 saturation was 97%.   PERTINENT LABORATORY DATA:  CBC: WBC 4700 with hemoglobin 11.3, hematocrit  32.3%, neutrophils 59%, lymphocytes 28%.  Chemistries:  Sodium 133,  potassium 3.6, chloride 103, CO2 24, glucose 87, BUN 5, creatinine 0.8,  calcium 8.7.   An IV was started and patient subsequently admitted.   SOCIAL HISTORY:  The patient does not smoke or drink alcohol.   FAMILY HISTORY:  Positive for coronary artery disease and hypertension.   PAST MEDICAL HISTORY:  The patient has a history of sarcoidosis.   MEDICATION LIST:  1.  Elavil 50 mg h.s.  2.  Prednisone 60 mg daily.  3.  Vicodin p.r.n.  4.  Ultram p.r.n.  5.  OxyContin p.r.n.   ALLERGIES:  ASPIRIN.   REVIEW OF SYSTEMS:  HEENT - negative. CARDIOPULMONARY - patient has left-  sided chest pain with no cough, no hemoptysis. GI -  no nausea, vomiting,  diarrhea and no bleeding.  GU - no dysuria or hematuria.   PHYSICAL EXAMINATION:  GENERAL:  Alert African-American female.  VITAL SIGNS:  Blood pressure 155/93, respiration 20, pulse 90,  temperature  98.9.  HEENT:  Eyes - PERRLA.  Tympanic membranes negative. Oropharynx benign.  NECK:  Supple, no JVD or thyroid abnormalities.  HEART:  Regular rhythm, no murmurs.  LUNGS:  Clear to P&A.  CHEST:  Tenderness over the left side of the chest.  ABDOMEN:  No palpable organs or masses, no organomegaly.  EXTREMITIES:  Free of edema.  SKIN:  Warm and dry.   DIAGNOSES:  1.  Bilateral bronchopneumonia.  2.  Sarcoidosis.      Angus G. Renard Matter, MD  Electronically Signed     AGM/MEDQ  D:  03/04/2006  T:  03/04/2006  Job:  [id number]

## 2011-05-03 LAB — CBC
HCT: 41.3
Hemoglobin: 14.3
MCHC: 34.5
RDW: 15

## 2011-05-03 LAB — COMPREHENSIVE METABOLIC PANEL
Alkaline Phosphatase: 87
BUN: 10
CO2: 27
Calcium: 10
GFR calc non Af Amer: >60
Glucose, Bld: 117 — ABNORMAL HIGH
Potassium: 3.5
Total Protein: 9.7 — ABNORMAL HIGH

## 2011-05-03 LAB — URINALYSIS, ROUTINE W REFLEX MICROSCOPIC
Hgb urine dipstick: NEGATIVE
Nitrite: NEGATIVE
Protein, ur: NEGATIVE
Specific Gravity, Urine: 1.025
Urobilinogen, UA: 0.2

## 2011-05-03 LAB — DIFFERENTIAL
Basophils Relative: 0
Monocytes Relative: 8
Neutro Abs: 5.2
Neutrophils Relative %: 67

## 2011-05-24 LAB — BASIC METABOLIC PANEL
CO2: 26
Calcium: 8.9
Creatinine, Ser: 0.69
GFR calc Af Amer: >60
GFR calc non Af Amer: >60
Sodium: 139

## 2011-05-24 LAB — DIFFERENTIAL
Basophils Relative: 1
Lymphocytes Relative: 23
Lymphs Abs: 1.2
Monocytes Absolute: 0.4
Monocytes Relative: 9
Neutro Abs: 3.4
Neutrophils Relative %: 66

## 2011-05-24 LAB — CBC
Hemoglobin: 11.9 — ABNORMAL LOW
RBC: 3.77 — ABNORMAL LOW
WBC: 5.1

## 2011-07-31 ENCOUNTER — Encounter: Payer: Self-pay | Admitting: Cardiology

## 2011-09-25 DIAGNOSIS — D869 Sarcoidosis, unspecified: Secondary | ICD-10-CM | POA: Diagnosis not present

## 2011-09-25 DIAGNOSIS — M1990 Unspecified osteoarthritis, unspecified site: Secondary | ICD-10-CM | POA: Diagnosis not present

## 2011-09-25 DIAGNOSIS — E669 Obesity, unspecified: Secondary | ICD-10-CM | POA: Diagnosis not present

## 2012-04-16 ENCOUNTER — Other Ambulatory Visit (HOSPITAL_COMMUNITY): Payer: Self-pay | Admitting: Pulmonary Disease

## 2012-04-16 DIAGNOSIS — Z139 Encounter for screening, unspecified: Secondary | ICD-10-CM

## 2012-04-16 DIAGNOSIS — D869 Sarcoidosis, unspecified: Secondary | ICD-10-CM | POA: Diagnosis not present

## 2012-04-16 DIAGNOSIS — K589 Irritable bowel syndrome without diarrhea: Secondary | ICD-10-CM | POA: Diagnosis not present

## 2012-04-16 DIAGNOSIS — G8929 Other chronic pain: Secondary | ICD-10-CM | POA: Diagnosis not present

## 2012-04-16 DIAGNOSIS — IMO0001 Reserved for inherently not codable concepts without codable children: Secondary | ICD-10-CM | POA: Diagnosis not present

## 2012-04-20 ENCOUNTER — Ambulatory Visit (HOSPITAL_COMMUNITY)
Admission: RE | Admit: 2012-04-20 | Discharge: 2012-04-20 | Disposition: A | Discharge status: Home / Self Care | Payer: Medicare Other | Source: Ambulatory Visit | Attending: Pulmonary Disease | Admitting: Pulmonary Disease

## 2012-04-20 DIAGNOSIS — Z1231 Encounter for screening mammogram for malignant neoplasm of breast: Secondary | ICD-10-CM | POA: Diagnosis not present

## 2012-04-20 DIAGNOSIS — Z139 Encounter for screening, unspecified: Secondary | ICD-10-CM

## 2012-04-23 ENCOUNTER — Telehealth: Payer: Self-pay | Admitting: Gastroenterology

## 2012-04-23 NOTE — Telephone Encounter (Signed)
Pt called this afternoon to be triaged. I told her that I would have the nurse call her back at [phone]

## 2012-04-27 ENCOUNTER — Encounter: Payer: Self-pay | Admitting: Gastroenterology

## 2012-04-27 ENCOUNTER — Ambulatory Visit (INDEPENDENT_AMBULATORY_CARE_PROVIDER_SITE_OTHER): Payer: Medicare Other | Admitting: Gastroenterology

## 2012-04-27 ENCOUNTER — Encounter (HOSPITAL_COMMUNITY): Payer: Self-pay | Admitting: Pharmacy Technician

## 2012-04-27 VITALS — BP 113/74 | HR 73 | Temp 98.4°F | Ht 62.5 in | Wt 187.3 lb

## 2012-04-27 DIAGNOSIS — K5900 Constipation, unspecified: Secondary | ICD-10-CM

## 2012-04-27 MED ORDER — PEG 3350-KCL-NA BICARB-NACL 420 G PO SOLR: 4000.0000 mL | ORAL | Status: DC

## 2012-04-27 MED ORDER — LUBIPROSTONE 8 MCG PO CAPS: 8.0000 ug | ORAL_CAPSULE | Freq: Two times a day (BID) | ORAL | Status: DC

## 2012-04-27 NOTE — Patient Instructions (Addendum)
Start taking Amitiza 8 mcg daily WITH FOOD for 3 days. After three days, you may increase to twice a day with food.   Review the high fiber diet.   We have set you up for a colonoscopy with Dr. Jena Gauss in the near future.    High Fiber Diet A high fiber diet changes your normal diet to include more whole grains, legumes, fruits, and vegetables. Changes in the diet involve replacing refined carbohydrates with unrefined foods. The calorie level of the diet is essentially unchanged. The Dietary Reference Intake (recommended amount) for adult males is 38 g per day. For adult females, it is 25 g per day. Pregnant and lactating women should consume 28 g of fiber per day. Fiber is the intact part of a plant that is not broken down during digestion. Functional fiber is fiber that has been isolated from the plant to provide a beneficial effect in the body. PURPOSE  Increase stool bulk.   Ease and regulate bowel movements.   Lower cholesterol.  INDICATIONS THAT YOU NEED MORE FIBER  Constipation and hemorrhoids.   Uncomplicated diverticulosis (intestine condition) and irritable bowel syndrome.   Weight management.   As a protective measure against hardening of the arteries (atherosclerosis), diabetes, and cancer.  NOTE OF CAUTION If you have a digestive or bowel problem, ask your caregiver for advice before adding high fiber foods to your diet. Some of the following medical problems are such that a high fiber diet should not be used without consulting your caregiver:  Acute diverticulitis (intestine infection).   Partial small bowel obstructions.   Complicated diverticular disease involving bleeding, rupture (perforation), or abscess (boil, furuncle).   Presence of autonomic neuropathy (nerve damage) or gastric paresis (stomach cannot empty itself).  GUIDELINES FOR INCREASING FIBER  Start adding fiber to the diet slowly. A gradual increase of about 5 more grams (2 slices of whole-wheat  bread, 2 servings of most fruits or vegetables, or 1 bowl of high fiber cereal) per day is best. Too rapid an increase in fiber may result in constipation, flatulence, and bloating.   Drink enough water and fluids to keep your urine clear or pale yellow. Water, juice, or caffeine-free drinks are recommended. Not drinking enough fluid may cause constipation.   Eat a variety of high fiber foods rather than one type of fiber.   Try to increase your intake of fiber through using high fiber foods rather than fiber pills or supplements that contain small amounts of fiber.   The goal is to change the types of food eaten. Do not supplement your present diet with high fiber foods, but replace foods in your present diet.  INCLUDE A VARIETY OF FIBER SOURCES  Replace refined and processed grains with whole grains, canned fruits with fresh fruits, and incorporate other fiber sources. White rice, white breads, and most bakery goods contain little or no fiber.   Brown whole-grain rice, buckwheat oats, and many fruits and vegetables are all good sources of fiber. These include: broccoli, Brussels sprouts, cabbage, cauliflower, beets, sweet potatoes, white potatoes (skin on), carrots, tomatoes, eggplant, squash, berries, fresh fruits, and dried fruits.   Cereals appear to be the richest source of fiber. Cereal fiber is found in whole grains and bran. Bran is the fiber-rich outer coat of cereal grain, which is largely removed in refining. In whole-grain cereals, the bran remains. In breakfast cereals, the largest amount of fiber is found in those with "bran" in their names. The fiber content is  sometimes indicated on the label.   You may need to include additional fruits and vegetables each day.   In baking, for 1 cup white flour, you may use the following substitutions:   1 cup whole-wheat flour minus 2 tbs.    cup white flour plus  cup whole-wheat flour.  Document Released: 07/29/2005 Document Revised:  07/18/2011 Document Reviewed: 06/06/2009 [facility] Patient Information 2012 [place], [place].

## 2012-04-27 NOTE — Telephone Encounter (Signed)
Pt had OV on 04/27/2012.

## 2012-04-27 NOTE — Assessment & Plan Note (Signed)
51 year old with need for initial screening colonoscopy. No FH of colon cancer. Notes significant chronic constipation. No improvement with OTC agents. Will proceed with Amitiza 8 mcg po BID. High fiber diet. Screening colonoscopy in near future. Will utilize additional day of clear liquids and extra 2liters of prep in addition to standard dosing to hopefully ensure adequate visualization of the colon.   Proceed with TCS with Dr. Jena Gauss in near future: the risks, benefits, and alternatives have been discussed with the patient in detail. The patient states understanding and desires to proceed. Amitiza 8 mcg BID

## 2012-04-27 NOTE — Progress Notes (Signed)
Primary Care Physician:  Fredirick Maudlin, MD Primary Gastroenterologist:  Dr. Jena Gauss  Chief Complaint  Patient presents with  . Colonoscopy    HPI: 51 year old female who presents today for screening colonoscopy. Hx of chronic constipation. She has lost 100+ pounds intentionally with diet and exercise. BM once every 2 weeks. Drinks a V8 to help have a BM. Sometimes works, sometimes not. Miralax does not help. Denies N/V, rectal bleeding. No upper GI symptoms. No FH of colon cancer.     Past Medical History  Diagnosis Date  . Chest pain   . Pulmonary sarcoidosis     Treatment with Prednisone  . Pneumonia   . GERD (gastroesophageal reflux disease)   . Hypercholesterolemia     Past Surgical History  Procedure Date  . Cholecystectomy   . Appendectomy   . Abdominal hysterectomy     Current Outpatient Prescriptions  Medication Sig Dispense Refill  . ALPRAZolam (XANAX) 1 MG tablet Take 1 mg by mouth at bedtime as needed. For sleep      . HYDROcodone-acetaminophen (VICODIN) 5-500 MG per tablet Take 1 tablet by mouth at bedtime as needed. For pain      . ibuprofen (ADVIL,MOTRIN) 200 MG tablet Take 400 mg by mouth every 6 (six) hours as needed. For pain      . traMADol (ULTRAM) 50 MG tablet Take 50 mg by mouth daily as needed. For pain      . lubiprostone (AMITIZA) 8 MCG capsule Take 1 capsule (8 mcg total) by mouth 2 (two) times daily with a meal.  60 capsule  3    Allergies as of 04/27/2012 - Review Complete 04/27/2012  Allergen Reaction Noted  . Aspirin  06/26/2009    Family History  Problem Relation Age of Onset  . Hypertension Mother   . Lupus Father   . Colon cancer Neg Hx     History   Social History  . Marital Status: Divorced    Spouse Name: N/A    Number of Children: N/A  . Years of Education: N/A   Occupational History  . Not on file.   Social History Main Topics  . Smoking status: Current Some Day Smoker -- 0.2 packs/day for 30 years    Types:  Cigarettes  . Smokeless tobacco: Not on file  . Alcohol Use: No  . Drug Use: No  . Sexually Active: Not on file   Other Topics Concern  . Not on file   Social History Narrative   No regular exercise    Review of Systems: Gen: Denies any fever, chills, fatigue, weight loss, lack of appetite.  CV: Denies chest pain, heart palpitations, peripheral edema, syncope.  Resp: Denies shortness of breath at rest or with exertion. Denies wheezing or cough.  GI: Denies dysphagia or odynophagia. Denies jaundice, hematemesis, fecal incontinence. GU : Denies urinary burning, urinary frequency, urinary hesitancy MS: Denies joint pain, muscle weakness, cramps, or limitation of movement.  Derm: Denies rash, itching, dry skin Psych: Denies depression, anxiety, memory loss, and confusion Heme: Denies bruising, bleeding, and enlarged lymph nodes.  Physical Exam: BP 113/74  Pulse 73  Temp 98.4 F (36.9 C) (Temporal)  Ht 5' 2.5" (1.588 m)  Wt 187 lb 4.8 oz (84.959 kg)  BMI 33.71 kg/m2 General:   Alert and oriented. Pleasant and cooperative. Well-nourished and well-developed.  Head:  Normocephalic and atraumatic. Eyes:  Without icterus, sclera clear and conjunctiva pink.  Ears:  Normal auditory acuity. Nose:  No deformity, discharge,  or lesions. Mouth:  No deformity or lesions, oral mucosa pink.  Neck:  Supple, without mass or thyromegaly. Lungs:  Clear to auscultation bilaterally. No wheezes, rales, or rhonchi. No distress.  Heart:  S1, S2 present without murmurs appreciated.  Abdomen:  +BS, soft, non-tender and non-distended. No HSM noted. No guarding or rebound. No masses appreciated.  Rectal:  Deferred  Msk:  Symmetrical without gross deformities. Normal posture. Extremities:  Without clubbing or edema. Neurologic:  Alert and  oriented x4;  grossly normal neurologically. Skin:  Intact without significant lesions or rashes. Cervical Nodes:  No significant cervical adenopathy. Psych:  Alert  and cooperative. Normal mood and affect.

## 2012-04-28 NOTE — Progress Notes (Signed)
Faxed to PCP

## 2012-05-07 ENCOUNTER — Telehealth: Payer: Self-pay | Admitting: Internal Medicine

## 2012-05-07 NOTE — Telephone Encounter (Signed)
Patient called to [facility] her procedure that she has scheduled for 05/12/12. Please return her call at [phone]

## 2012-05-07 NOTE — Telephone Encounter (Signed)
RSC

## 2012-05-20 MED ORDER — SODIUM CHLORIDE 0.45 % IV SOLN: INTRAVENOUS | Status: DC

## 2012-05-21 ENCOUNTER — Ambulatory Visit (HOSPITAL_COMMUNITY): Admission: RE | Admit: 2012-05-21 | Payer: Medicare Other | Source: Ambulatory Visit | Admitting: Internal Medicine

## 2012-05-21 ENCOUNTER — Telehealth: Payer: Self-pay | Admitting: Internal Medicine

## 2012-05-21 ENCOUNTER — Encounter (HOSPITAL_COMMUNITY): Admission: RE | Payer: Self-pay | Source: Ambulatory Visit

## 2012-05-21 SURGERY — COLONOSCOPY
Anesthesia: Moderate Sedation

## 2012-05-21 NOTE — Telephone Encounter (Signed)
Melanie from Short Stay called to let us know that RMR's patient had called to cancel her procedure today due to sickness

## 2013-06-10 DIAGNOSIS — Z23 Encounter for immunization: Secondary | ICD-10-CM | POA: Diagnosis not present

## 2013-07-01 DIAGNOSIS — H31099 Other chorioretinal scars, unspecified eye: Secondary | ICD-10-CM | POA: Diagnosis not present

## 2013-07-12 ENCOUNTER — Ambulatory Visit (HOSPITAL_COMMUNITY)
Admission: RE | Admit: 2013-07-12 | Discharge: 2013-07-12 | Disposition: A | Discharge status: Home / Self Care | Payer: Medicare Other | Source: Ambulatory Visit | Attending: Interventional Cardiology | Admitting: Interventional Cardiology

## 2013-07-12 ENCOUNTER — Other Ambulatory Visit: Payer: Self-pay | Admitting: Interventional Cardiology

## 2013-07-12 DIAGNOSIS — R079 Chest pain, unspecified: Secondary | ICD-10-CM

## 2013-07-12 MED ORDER — IOHEXOL 350 MG/ML SOLN: 80.0000 mL | Freq: Once | INTRAVENOUS | Status: AC | PRN

## 2013-07-16 DIAGNOSIS — F17200 Nicotine dependence, unspecified, uncomplicated: Secondary | ICD-10-CM | POA: Diagnosis not present

## 2013-07-16 DIAGNOSIS — Z0000 Encounter for general adult medical examination without abnormal findings: Secondary | ICD-10-CM | POA: Diagnosis not present

## 2013-07-22 DIAGNOSIS — Z1212 Encounter for screening for malignant neoplasm of rectum: Secondary | ICD-10-CM | POA: Diagnosis not present

## 2013-07-22 LAB — FECAL OCCULT BLOOD, GUAIAC: Fecal Occult Blood: NEGATIVE

## 2013-12-15 DIAGNOSIS — D869 Sarcoidosis, unspecified: Secondary | ICD-10-CM | POA: Diagnosis not present

## 2013-12-15 DIAGNOSIS — Z5181 Encounter for therapeutic drug level monitoring: Secondary | ICD-10-CM | POA: Diagnosis not present

## 2013-12-15 DIAGNOSIS — Z79899 Other long term (current) drug therapy: Secondary | ICD-10-CM | POA: Diagnosis not present

## 2013-12-18 ENCOUNTER — Encounter: Payer: Self-pay | Admitting: Pulmonary Disease

## 2013-12-23 DIAGNOSIS — D869 Sarcoidosis, unspecified: Secondary | ICD-10-CM | POA: Diagnosis not present

## 2013-12-23 DIAGNOSIS — F411 Generalized anxiety disorder: Secondary | ICD-10-CM | POA: Diagnosis not present

## 2013-12-23 DIAGNOSIS — K5900 Constipation, unspecified: Secondary | ICD-10-CM | POA: Diagnosis not present

## 2013-12-23 DIAGNOSIS — M1990 Unspecified osteoarthritis, unspecified site: Secondary | ICD-10-CM | POA: Diagnosis not present

## 2013-12-24 ENCOUNTER — Other Ambulatory Visit (HOSPITAL_COMMUNITY): Payer: Self-pay

## 2013-12-24 DIAGNOSIS — D869 Sarcoidosis, unspecified: Secondary | ICD-10-CM

## 2013-12-29 ENCOUNTER — Other Ambulatory Visit (HOSPITAL_COMMUNITY): Payer: Self-pay | Admitting: Pulmonary Disease

## 2013-12-29 ENCOUNTER — Ambulatory Visit (HOSPITAL_COMMUNITY)
Admission: RE | Admit: 2013-12-29 | Discharge: 2013-12-29 | Disposition: A | Discharge status: Home / Self Care | Payer: Medicare Other | Source: Ambulatory Visit | Attending: Pulmonary Disease | Admitting: Pulmonary Disease

## 2013-12-29 DIAGNOSIS — Z1231 Encounter for screening mammogram for malignant neoplasm of breast: Secondary | ICD-10-CM

## 2013-12-29 DIAGNOSIS — D869 Sarcoidosis, unspecified: Secondary | ICD-10-CM | POA: Insufficient documentation

## 2013-12-29 DIAGNOSIS — I517 Cardiomegaly: Secondary | ICD-10-CM | POA: Insufficient documentation

## 2013-12-29 LAB — BLOOD GAS, ARTERIAL
ACID-BASE EXCESS: 1.3 mmol/L (ref 0.0–2.0)
Bicarbonate: 25.4 mEq/L — ABNORMAL HIGH (ref 20.0–24.0)
FIO2: 0.21 %
O2 SAT: 96 %
PO2 ART: 78.8 mmHg — AB (ref 80.0–100.0)
Patient temperature: 37
TCO2: 22.7 mmol/L (ref 0–100)
pCO2 arterial: 40.6 mmHg (ref 35.0–45.0)
pH, Arterial: 7.413 (ref 7.350–7.450)

## 2013-12-29 MED ORDER — ALBUTEROL SULFATE (2.5 MG/3ML) 0.083% IN NEBU
2.5000 mg | INHALATION_SOLUTION | Freq: Once | RESPIRATORY_TRACT | Status: AC
  Administered 2013-12-29: 2.5 mg via RESPIRATORY_TRACT

## 2014-01-05 LAB — PULMONARY FUNCTION TEST
DL/VA % PRED: 90 %
DL/VA: 4.15 ml/min/mmHg/L
DLCO COR: 12 ml/min/mmHg
DLCO UNC % PRED: 52 %
DLCO cor % pred: 53 %
DLCO unc: 11.78 ml/min/mmHg
FEF 25-75 POST: 2.04 L/s
FEF 25-75 PRE: 1.57 L/s
FEF2575-%Change-Post: 29 %
FEF2575-%PRED-POST: 90 %
FEF2575-%Pred-Pre: 69 %
FEV1-%Change-Post: 4 %
FEV1-%Pred-Post: 86 %
FEV1-%Pred-Pre: 82 %
FEV1-Post: 1.85 L
FEV1-Pre: 1.76 L
FEV1FVC-%Change-Post: 11 %
FEV1FVC-%Pred-Pre: 98 %
FEV6-%CHANGE-POST: -5 %
FEV6-%PRED-POST: 80 %
FEV6-%PRED-PRE: 84 %
FEV6-POST: 2.08 L
FEV6-Pre: 2.21 L
FEV6FVC-%PRED-POST: 103 %
FEV6FVC-%Pred-Pre: 103 %
FVC-%CHANGE-POST: -5 %
FVC-%PRED-POST: 77 %
FVC-%PRED-PRE: 82 %
FVC-PRE: 2.21 L
FVC-Post: 2.08 L
PRE FEV1/FVC RATIO: 80 %
Post FEV1/FVC ratio: 89 %
Post FEV6/FVC ratio: 100 %
Pre FEV6/FVC Ratio: 100 %
RV % pred: 78 %
RV: 1.38 L
TLC % pred: 70 %
TLC: 3.38 L

## 2014-01-06 ENCOUNTER — Ambulatory Visit (HOSPITAL_COMMUNITY): Payer: Medicare Other

## 2014-02-10 DIAGNOSIS — D869 Sarcoidosis, unspecified: Secondary | ICD-10-CM | POA: Diagnosis not present

## 2014-02-10 DIAGNOSIS — K5900 Constipation, unspecified: Secondary | ICD-10-CM | POA: Diagnosis not present

## 2014-02-15 DIAGNOSIS — H524 Presbyopia: Secondary | ICD-10-CM | POA: Diagnosis not present

## 2014-02-15 DIAGNOSIS — H31099 Other chorioretinal scars, unspecified eye: Secondary | ICD-10-CM | POA: Diagnosis not present

## 2014-05-11 DIAGNOSIS — H2000 Unspecified acute and subacute iridocyclitis: Secondary | ICD-10-CM | POA: Diagnosis not present

## 2014-05-12 DIAGNOSIS — S0501XD Injury of conjunctiva and corneal abrasion without foreign body, right eye, subsequent encounter: Secondary | ICD-10-CM | POA: Diagnosis not present

## 2014-05-12 DIAGNOSIS — H20011 Primary iridocyclitis, right eye: Secondary | ICD-10-CM | POA: Diagnosis not present

## 2014-05-13 DIAGNOSIS — S0501XD Injury of conjunctiva and corneal abrasion without foreign body, right eye, subsequent encounter: Secondary | ICD-10-CM | POA: Diagnosis not present

## 2014-05-13 DIAGNOSIS — H20011 Primary iridocyclitis, right eye: Secondary | ICD-10-CM | POA: Diagnosis not present

## 2014-05-16 DIAGNOSIS — D869 Sarcoidosis, unspecified: Secondary | ICD-10-CM | POA: Diagnosis not present

## 2014-05-16 DIAGNOSIS — K589 Irritable bowel syndrome without diarrhea: Secondary | ICD-10-CM | POA: Diagnosis not present

## 2014-05-16 DIAGNOSIS — G894 Chronic pain syndrome: Secondary | ICD-10-CM | POA: Diagnosis not present

## 2014-05-16 DIAGNOSIS — Z23 Encounter for immunization: Secondary | ICD-10-CM | POA: Diagnosis not present

## 2014-06-16 DIAGNOSIS — E669 Obesity, unspecified: Secondary | ICD-10-CM | POA: Diagnosis not present

## 2014-06-16 DIAGNOSIS — G894 Chronic pain syndrome: Secondary | ICD-10-CM | POA: Diagnosis not present

## 2014-06-16 DIAGNOSIS — J209 Acute bronchitis, unspecified: Secondary | ICD-10-CM | POA: Diagnosis not present

## 2014-06-16 DIAGNOSIS — D869 Sarcoidosis, unspecified: Secondary | ICD-10-CM | POA: Diagnosis not present

## 2014-07-12 ENCOUNTER — Other Ambulatory Visit (HOSPITAL_COMMUNITY): Payer: Self-pay | Admitting: Pulmonary Disease

## 2014-07-12 DIAGNOSIS — Z1231 Encounter for screening mammogram for malignant neoplasm of breast: Secondary | ICD-10-CM

## 2014-07-20 ENCOUNTER — Ambulatory Visit (HOSPITAL_COMMUNITY): Payer: Medicare Other

## 2014-07-21 ENCOUNTER — Ambulatory Visit: Payer: Medicare Other | Admitting: Nutrition

## 2014-07-28 ENCOUNTER — Ambulatory Visit (HOSPITAL_COMMUNITY)
Admission: RE | Admit: 2014-07-28 | Discharge: 2014-07-28 | Disposition: A | Discharge status: Home / Self Care | Payer: Medicare Other | Source: Ambulatory Visit | Attending: Pulmonary Disease | Admitting: Pulmonary Disease

## 2014-07-28 ENCOUNTER — Ambulatory Visit (HOSPITAL_COMMUNITY): Payer: Medicare Other

## 2014-07-28 DIAGNOSIS — Z1231 Encounter for screening mammogram for malignant neoplasm of breast: Secondary | ICD-10-CM | POA: Diagnosis not present

## 2014-09-09 ENCOUNTER — Encounter: Payer: Self-pay | Admitting: Nutrition

## 2014-09-09 ENCOUNTER — Encounter: Payer: Medicare Other | Attending: Pulmonary Disease | Admitting: Nutrition

## 2014-09-09 VITALS — Ht 62.0 in | Wt 221.0 lb

## 2014-09-09 DIAGNOSIS — E119 Type 2 diabetes mellitus without complications: Secondary | ICD-10-CM | POA: Diagnosis not present

## 2014-09-09 DIAGNOSIS — Z713 Dietary counseling and surveillance: Secondary | ICD-10-CM | POA: Insufficient documentation

## 2014-09-09 DIAGNOSIS — K5900 Constipation, unspecified: Secondary | ICD-10-CM | POA: Diagnosis not present

## 2014-09-09 DIAGNOSIS — G8929 Other chronic pain: Secondary | ICD-10-CM | POA: Insufficient documentation

## 2014-09-09 DIAGNOSIS — D869 Sarcoidosis, unspecified: Secondary | ICD-10-CM | POA: Diagnosis not present

## 2014-09-09 DIAGNOSIS — Z6841 Body Mass Index (BMI) 40.0 and over, adult: Secondary | ICD-10-CM | POA: Insufficient documentation

## 2014-09-09 NOTE — Progress Notes (Signed)
  Medical Nutrition Therapy:  Appt start time: 1000 end time:  1100.  Assessment:  Primary concerns today: Obesity. Currently had sarcodosis..  Use to weight 352 about 1 year ago. Lost about 100 lbs int eh last year. Has been working out at [employer]. She does her own shopping and cooking. Most foods are baked and broiled. She has 9 foster children for for the last 25 years. PMH: sarcoidosis and Chronic Pain and chronic constipation   Preferred Learning Style:     No preference indicated   Learning Readiness:   Ready  Change in progress   MEDICATIONS: See list   DIETARY INTAKE:   24-hr recall:  B ( AM): Fried egg with pam oil spray and 1 slice ww toast Snk ( AM):   L ( PM): Honey Nuts and oats 1-2 c, 2% milk Snk ( PM): Corn chips,  D ( PM): Chicken pot pie, water Snk ( PM): none Beverages: water, crystal light  Usual physical activity: working out at  [employer] three-4 days a week.  Estimated energy needs: 1500 calories 170 g carbohydrates 112 g protein 42 g fat  Progress Towards Goal(s):  In progress.   Nutritional Diagnosis:  NB-1.1 Food and nutrition-related knowledge deficit As related to Obesity.  As evidenced by BMI of 40.    Intervention:  Nutrition counseling on weight loss tips, meal planning, portion sizes, and a high fiber, low fat, low sodium diet.  Goals:  Follow  The Plate Method as discussed.  Measure foods out.  Avoid skipping meals.  Eat three balanced meals at about the same time every day.  Try eating prunes or prune juice for bowel movements every day or every other day.   Eat 2-3 carb choices per meal   Avoid snacks between meals.  Increase fiber from fresh fruits and vegetables.  Cut out junk food, cakes, cookies and sweets  Add lean protein foods to meals  Aim for 60 mins of physical activity daily  Bring food record to next nutrition visit  Goal: Lose 1 lb per week-4 lbs by next visit.. 2. Cut out sweets, cakes, candy and  sweets. 3. Increase fiber intake 4. Drink 64 oz or more of water.  Teaching Method Utilized:  Visual Auditory Hands on  Handouts given during visit include: The Plate Method Meal Plan Card  Barriers to learning/adherence to lifestyle change: none  Demonstrated degree of understanding via:  Teach Back   Monitoring/Evaluation:  Dietary intake, exercise, meal planning, and body weight in 1 month(s).

## 2014-09-09 NOTE — Patient Instructions (Signed)
Goals:  Follow  The Plate Method as discussed.  Measure foods out.  Avoid skipping meals.  Eat three balanced meals at about the same time every day.  Try eating prunes or prune juice for bowel movements every day or every other day.   Eat 2-3 carb choices per meal   Avoid snacks between meals.  Increase fiber from fresh fruits and vegetables.  Cut out junk food, cakes, cookies and sweets  Add lean protein foods to meals  Aim for 60 mins of physical activity daily  Bring food record to next nutrition visit  Goal: Lose 1 lb per week-4 lbs by next visit.. 2. Cut out sweets, cakes, candy and sweets. 3. Increase fiber intake 4. Drink 64 oz or more of water.

## 2014-09-19 DIAGNOSIS — D869 Sarcoidosis, unspecified: Secondary | ICD-10-CM | POA: Diagnosis not present

## 2014-09-19 DIAGNOSIS — G894 Chronic pain syndrome: Secondary | ICD-10-CM | POA: Diagnosis not present

## 2014-09-19 DIAGNOSIS — E669 Obesity, unspecified: Secondary | ICD-10-CM | POA: Diagnosis not present

## 2014-09-19 DIAGNOSIS — K589 Irritable bowel syndrome without diarrhea: Secondary | ICD-10-CM | POA: Diagnosis not present

## 2014-10-28 ENCOUNTER — Encounter: Payer: Medicare Other | Attending: Pulmonary Disease | Admitting: Nutrition

## 2014-10-28 DIAGNOSIS — Z6841 Body Mass Index (BMI) 40.0 and over, adult: Secondary | ICD-10-CM | POA: Insufficient documentation

## 2014-10-28 DIAGNOSIS — K5900 Constipation, unspecified: Secondary | ICD-10-CM | POA: Insufficient documentation

## 2014-10-28 DIAGNOSIS — G8929 Other chronic pain: Secondary | ICD-10-CM | POA: Insufficient documentation

## 2014-10-28 DIAGNOSIS — Z713 Dietary counseling and surveillance: Secondary | ICD-10-CM | POA: Insufficient documentation

## 2014-10-28 DIAGNOSIS — D869 Sarcoidosis, unspecified: Secondary | ICD-10-CM | POA: Insufficient documentation

## 2014-10-28 NOTE — Patient Instructions (Signed)
Goals: Keep up the good work!!  Increase fresh fruits and vegetables daily.  Avoid snacks   Avoid sugared cereals  Increase fiber in diet       Cut  down on fat in diet Increase  Physical activity to 60 minutes 4 days a week. Lose 5 lbs within the next month.

## 2014-10-28 NOTE — Progress Notes (Signed)
  Medical Nutrition Therapy:  Appt start time: 0800 end time:  0830  Assessment:  Primary concerns today: Obesity. Lost 5 lbs. Now in a 14 size. Quit eating a lot of sweets. Increased physical activity by walking some.   Diet is insuffient in fresh fruits and vegetables and whole grains. She has been using more fat and didn't realize how many calories it has been contributing to her diet.   Preferred Learning Style:     No preference indicated   Learning Readiness:   Ready  Change in progress   MEDICATIONS: See list   DIETARY INTAKE:   24-hr recall:  B ( AM): Fried egg with pam oil spray and 1 slice ww toast Snk ( AM):   L ( PM): ham with mayonnaise, water Snk ( PM):   D ( PM): Chicken pot pie, water Snk ( PM): none Beverages: water, crystal light  Usual physical activity: working out at  [employer] three-4 days a week.  Estimated energy needs: 1500 calories 170 g carbohydrates 112 g protein 42 g fat  Progress Towards Goal(s):  In progress.   Nutritional Diagnosis:  NB-1.1 Food and nutrition-related knowledge deficit As related to Obesity.  As evidenced by BMI of 40.    Intervention:  Nutrition counseling on weight loss tips, meal planning, portion sizes, and a high fiber, low fat, low sodium diet.  Goals: Keep up the good work!!  Increase fresh fruits and vegetables daily.  Avoid snacks   Avoid sugared cereals  Increase fiber in diet       Cut  down on fat in diet Increase  Physical activity to 60 minutes 4 days a week. Lose 5 lbs within the next month.  Teaching Method Utilized:  Visual Auditory Hands on  Handouts given during visit include: The Plate Method Meal Plan Card  Barriers to learning/adherence to lifestyle change: none  Demonstrated degree of understanding via:  Teach Back   Monitoring/Evaluation:  Dietary intake, exercise, meal planning, and body weight in 1 month(s).

## 2014-12-19 ENCOUNTER — Encounter: Payer: Medicare Other | Attending: Pulmonary Disease | Admitting: Nutrition

## 2014-12-19 DIAGNOSIS — D869 Sarcoidosis, unspecified: Secondary | ICD-10-CM | POA: Insufficient documentation

## 2014-12-19 DIAGNOSIS — K5900 Constipation, unspecified: Secondary | ICD-10-CM | POA: Insufficient documentation

## 2014-12-19 DIAGNOSIS — L509 Urticaria, unspecified: Secondary | ICD-10-CM | POA: Diagnosis not present

## 2014-12-19 DIAGNOSIS — Z6841 Body Mass Index (BMI) 40.0 and over, adult: Secondary | ICD-10-CM | POA: Insufficient documentation

## 2014-12-19 DIAGNOSIS — Z713 Dietary counseling and surveillance: Secondary | ICD-10-CM | POA: Insufficient documentation

## 2014-12-19 DIAGNOSIS — G8929 Other chronic pain: Secondary | ICD-10-CM | POA: Insufficient documentation

## 2015-01-18 DIAGNOSIS — D869 Sarcoidosis, unspecified: Secondary | ICD-10-CM | POA: Diagnosis not present

## 2015-01-18 DIAGNOSIS — K5901 Slow transit constipation: Secondary | ICD-10-CM | POA: Diagnosis not present

## 2015-01-18 DIAGNOSIS — E669 Obesity, unspecified: Secondary | ICD-10-CM | POA: Diagnosis not present

## 2015-01-18 DIAGNOSIS — G894 Chronic pain syndrome: Secondary | ICD-10-CM | POA: Diagnosis not present

## 2015-02-14 DIAGNOSIS — I10 Essential (primary) hypertension: Secondary | ICD-10-CM | POA: Diagnosis not present

## 2015-02-22 DIAGNOSIS — G894 Chronic pain syndrome: Secondary | ICD-10-CM | POA: Diagnosis not present

## 2015-02-22 DIAGNOSIS — D869 Sarcoidosis, unspecified: Secondary | ICD-10-CM | POA: Diagnosis not present

## 2015-02-22 DIAGNOSIS — K5901 Slow transit constipation: Secondary | ICD-10-CM | POA: Diagnosis not present

## 2015-02-24 ENCOUNTER — Other Ambulatory Visit (HOSPITAL_COMMUNITY): Payer: Self-pay | Admitting: Respiratory Therapy

## 2015-02-24 DIAGNOSIS — D869 Sarcoidosis, unspecified: Secondary | ICD-10-CM

## 2015-03-24 ENCOUNTER — Ambulatory Visit (HOSPITAL_COMMUNITY)
Admission: RE | Admit: 2015-03-24 | Discharge: 2015-03-24 | Disposition: A | Discharge status: Home / Self Care | Payer: Medicare Other | Source: Ambulatory Visit | Attending: Pulmonary Disease | Admitting: Pulmonary Disease

## 2015-03-24 DIAGNOSIS — D8689 Sarcoidosis of other sites: Secondary | ICD-10-CM | POA: Diagnosis not present

## 2015-03-24 DIAGNOSIS — Z720 Tobacco use: Secondary | ICD-10-CM | POA: Insufficient documentation

## 2015-03-24 MED ORDER — ALBUTEROL SULFATE (2.5 MG/3ML) 0.083% IN NEBU
2.5000 mg | INHALATION_SOLUTION | Freq: Once | RESPIRATORY_TRACT | Status: AC
  Administered 2015-03-24: 2.5 mg via RESPIRATORY_TRACT

## 2015-03-29 LAB — PULMONARY FUNCTION TEST
DL/VA % pred: 86 %
DL/VA: 3.96 ml/min/mmHg/L
DLCO UNC % PRED: 53 %
DLCO unc: 11.8 ml/min/mmHg
FEF 25-75 POST: 1.94 L/s
FEF 25-75 Pre: 1.58 L/sec
FEF2575-%CHANGE-POST: 22 %
FEF2575-%PRED-POST: 87 %
FEF2575-%Pred-Pre: 71 %
FEV1-%CHANGE-POST: 4 %
FEV1-%PRED-PRE: 78 %
FEV1-%Pred-Post: 82 %
FEV1-PRE: 1.65 L
FEV1-Post: 1.73 L
FEV1FVC-%CHANGE-POST: 5 %
FEV1FVC-%PRED-PRE: 100 %
FEV6-%CHANGE-POST: 0 %
FEV6-%Pred-Post: 78 %
FEV6-%Pred-Pre: 78 %
FEV6-POST: 2.01 L
FEV6-Pre: 2.02 L
FEV6FVC-%PRED-POST: 103 %
FEV6FVC-%Pred-Pre: 103 %
FVC-%CHANGE-POST: 0 %
FVC-%PRED-POST: 76 %
FVC-%Pred-Pre: 76 %
FVC-POST: 2.01 L
FVC-Pre: 2.02 L
POST FEV1/FVC RATIO: 86 %
PRE FEV1/FVC RATIO: 82 %
PRE FEV6/FVC RATIO: 100 %
Post FEV6/FVC ratio: 100 %
RV % PRED: 80 %
RV: 1.44 L
TLC % pred: 72 %
TLC: 3.49 L

## 2015-04-11 ENCOUNTER — Other Ambulatory Visit: Payer: Self-pay | Admitting: Obstetrics & Gynecology

## 2015-04-11 ENCOUNTER — Encounter: Payer: Self-pay | Admitting: Obstetrics & Gynecology

## 2015-04-11 ENCOUNTER — Ambulatory Visit (INDEPENDENT_AMBULATORY_CARE_PROVIDER_SITE_OTHER): Payer: Medicare Other | Admitting: Obstetrics & Gynecology

## 2015-04-11 VITALS — BP 130/80 | HR 80 | Wt 224.4 lb

## 2015-04-11 DIAGNOSIS — Z90710 Acquired absence of both cervix and uterus: Secondary | ICD-10-CM

## 2015-04-11 DIAGNOSIS — Z9079 Acquired absence of other genital organ(s): Secondary | ICD-10-CM

## 2015-04-11 DIAGNOSIS — R1031 Right lower quadrant pain: Secondary | ICD-10-CM | POA: Diagnosis not present

## 2015-04-11 DIAGNOSIS — R1032 Left lower quadrant pain: Secondary | ICD-10-CM

## 2015-04-11 DIAGNOSIS — Z90722 Acquired absence of ovaries, bilateral: Secondary | ICD-10-CM

## 2015-04-11 NOTE — Progress Notes (Signed)
Patient ID: [name], female   DOB: [birth year], 54 y.o.   MRN: [id number] Chief Complaint  Patient presents with  . gyn visit    lower abdominal pain.    Blood pressure 130/80, pulse 80, weight 224 lb 6.4 oz (101.787 kg).  54 y.o. No obstetric history on file. No LMP recorded. Patient has had a hysterectomy. The current method of family planning is status post hysterectomy.  Subjective Bilateral lower abdominal and pelvic pain for the last 2 months, worsening Not associated with eating constipation diarrhea Has had TAH BSO  Objective Abdomen soft tender bilaterally no rebound Vulva:  normal appearing vulva with no masses, tenderness or lesions Vagina:  normal mucosa, no discharge Cervix:  absent Uterus:  uterus absent Adnexa: ovaries:not present,  normal adnexa in size, nontender and no masses    Pertinent ROS No burning with urination, frequency or urgency No nausea, vomiting or diarrhea Nor fever chills or other constitutional symptoms   Labs or studies None done prior    Impression Diagnoses this Encounter::   ICD-9-CM ICD-10-CM   1. Bilateral lower abdominal pain 789.03 R10.31 CBC   789.04 R10.32 COMPLETE METABOLIC PANEL WITH GFR     CT Abdomen Pelvis W Contrast     CT Abdomen Pelvis W Contrast  2. S/P total hysterectomy and bilateral salpingo-oophorectomy V88.01 Z90.710    V45.77 Z90.79     Z90.722     Established relevant diagnosis(es): sarcoidosis  Plan/Recommendations: Follow up in 2 weeks  Labs or Scans Ordered: Orders Placed This Encounter  Procedures  . CT Abdomen Pelvis W Contrast  . CT Abdomen Pelvis W Contrast  . CBC  . COMPLETE METABOLIC PANEL WITH GFR    Evaluate for diverticular disease or other intra abdominal issues  Follow up Return in about 2 weeks (around 04/25/2015) for Follow up, with Dr Despina Hidden.      All questions were answered.

## 2015-04-12 LAB — COMPREHENSIVE METABOLIC PANEL
A/G RATIO: 1 — AB (ref 1.1–2.5)
ALBUMIN: 4.1 g/dL (ref 3.5–5.5)
ALK PHOS: 81 IU/L (ref 39–117)
ALT: 12 IU/L (ref 0–32)
AST: 22 IU/L (ref 0–40)
BUN / CREAT RATIO: 14 (ref 9–23)
BUN: 8 mg/dL (ref 6–24)
Bilirubin Total: 0.4 mg/dL (ref 0.0–1.2)
CO2: 22 mmol/L (ref 18–29)
CREATININE: 0.59 mg/dL (ref 0.57–1.00)
Calcium: 9.7 mg/dL (ref 8.7–10.2)
Chloride: 101 mmol/L (ref 97–108)
GFR calc Af Amer: 121 mL/min/{1.73_m2} (ref >59)
GFR calc non Af Amer: 105 mL/min/{1.73_m2} (ref >59)
GLOBULIN, TOTAL: 4.2 g/dL (ref 1.5–4.5)
Glucose: 93 mg/dL (ref 65–99)
POTASSIUM: 4.3 mmol/L (ref 3.5–5.2)
SODIUM: 139 mmol/L (ref 134–144)
Total Protein: 8.3 g/dL (ref 6.0–8.5)

## 2015-04-25 ENCOUNTER — Encounter: Payer: Self-pay | Admitting: *Deleted

## 2015-04-25 ENCOUNTER — Ambulatory Visit: Payer: Medicare Other | Admitting: Obstetrics & Gynecology

## 2015-05-05 IMAGING — MG MM DIGITAL SCREENING
4 series · 4 of 4 positions shown · non-contrast
Comparison: Previous exam(s).

CLINICAL DATA: Screening.

EXAM:
DIGITAL SCREENING BILATERAL MAMMOGRAM WITH CAD

[L CC]
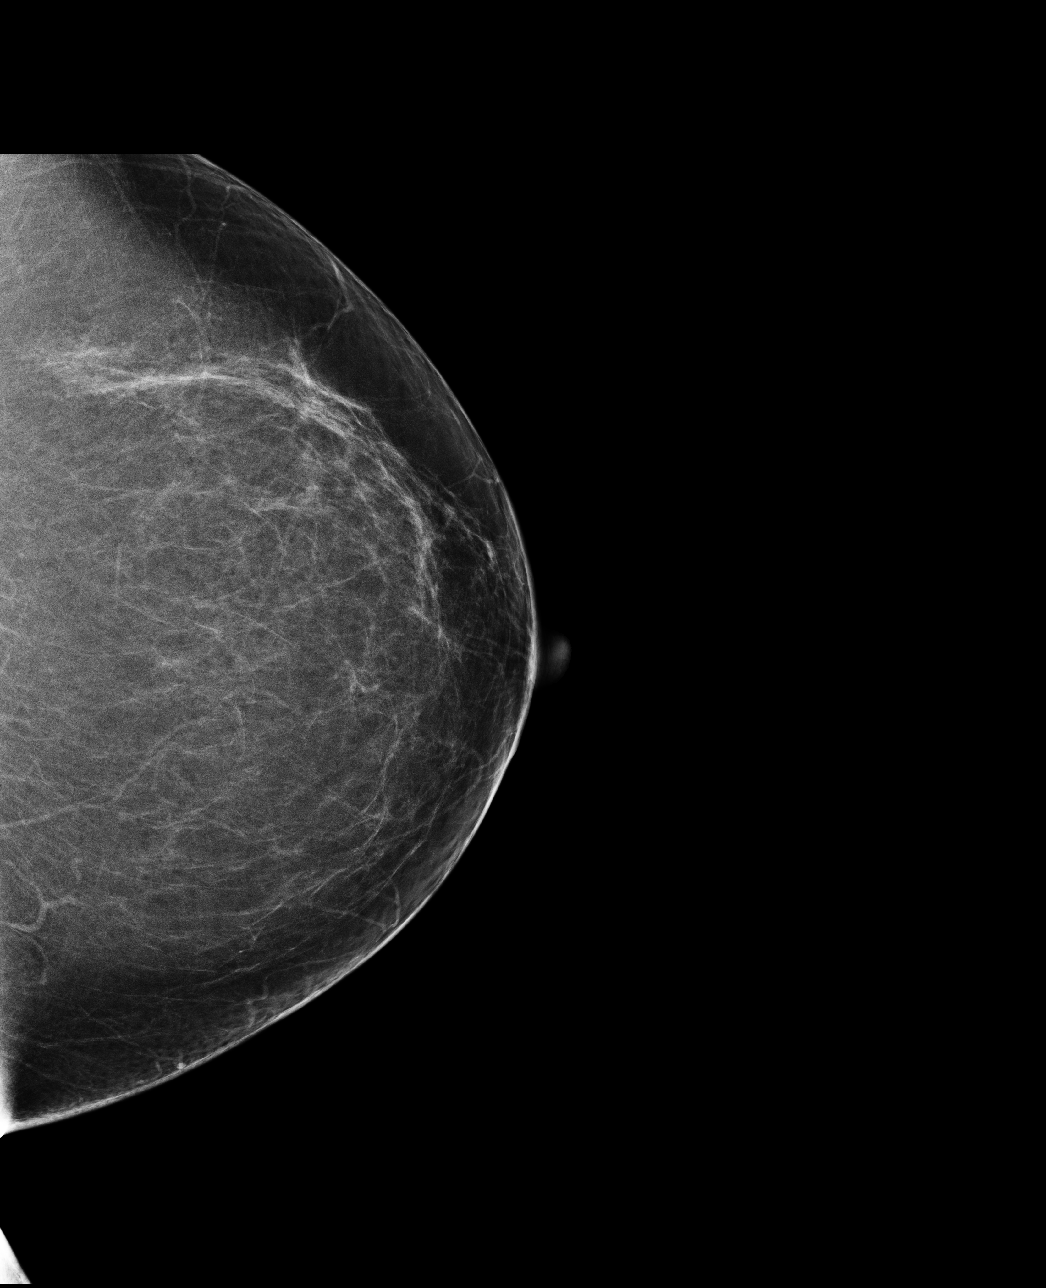

[L MLO]
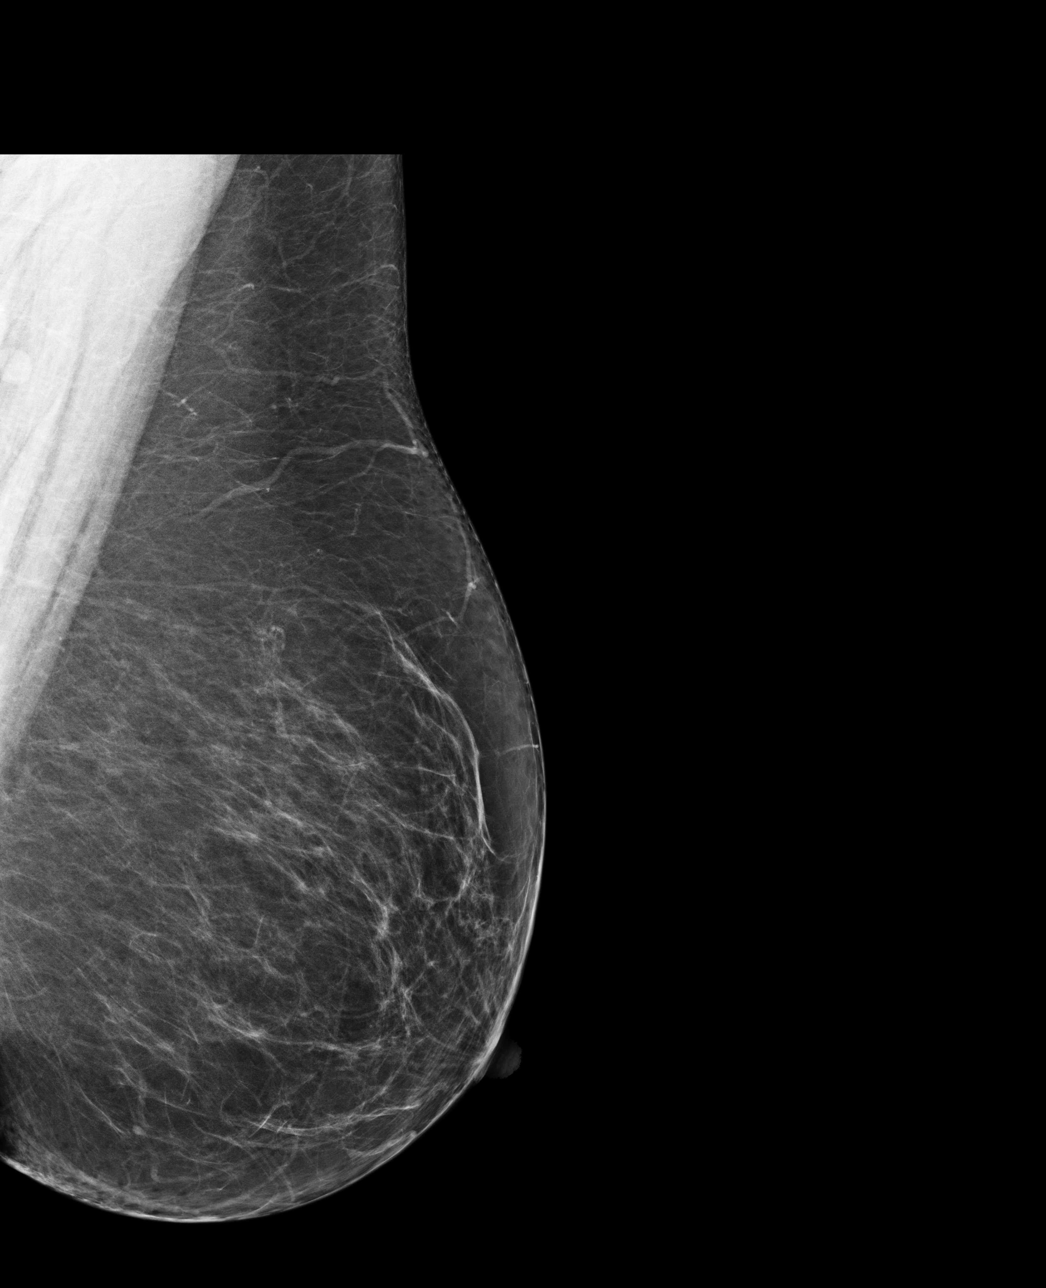

[R CC]
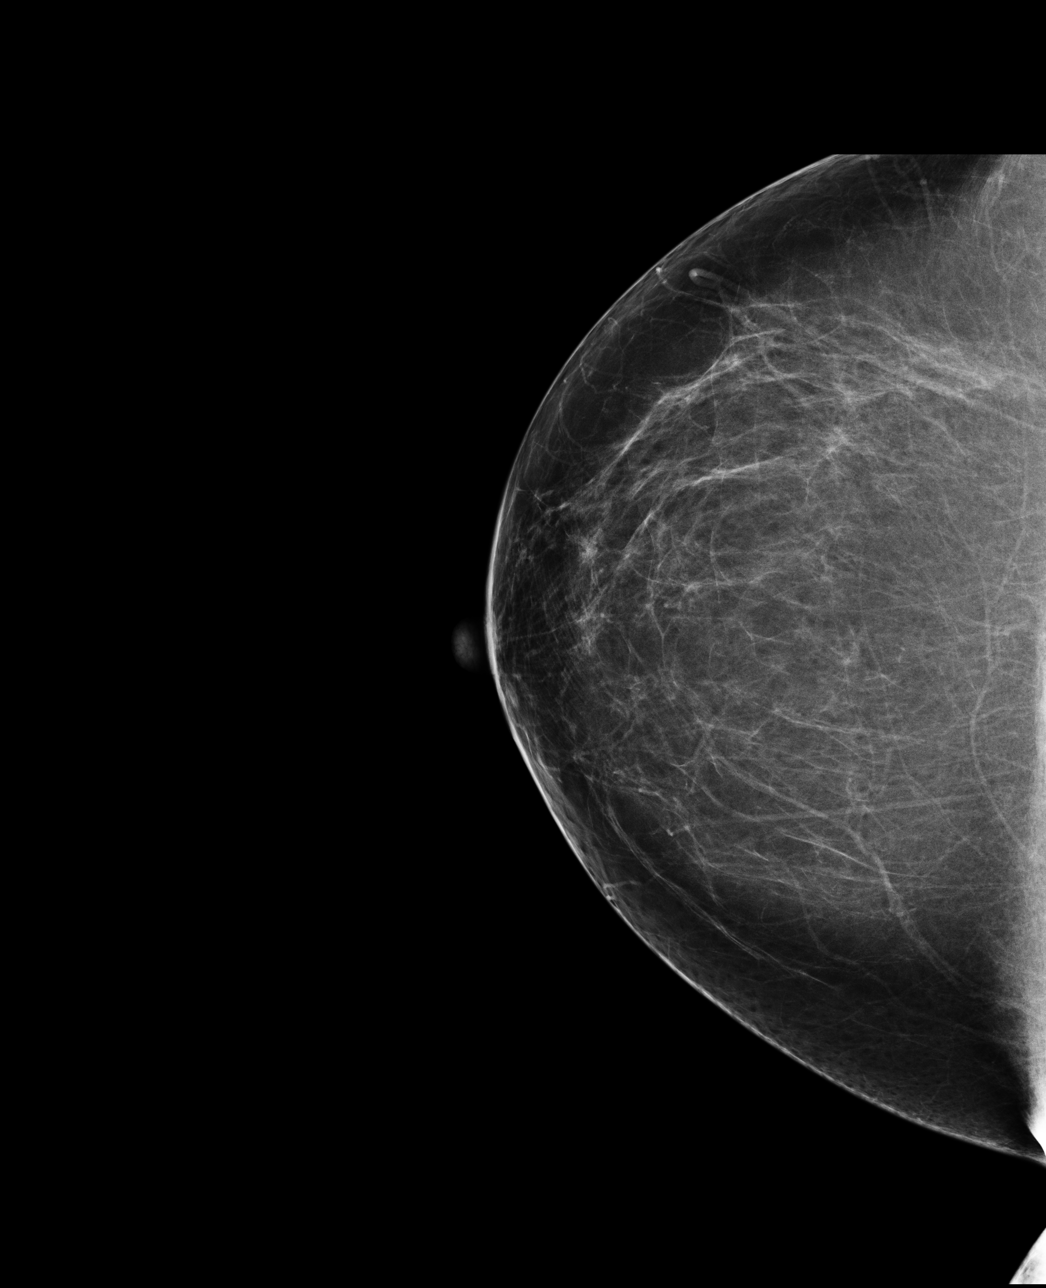

[R MLO]
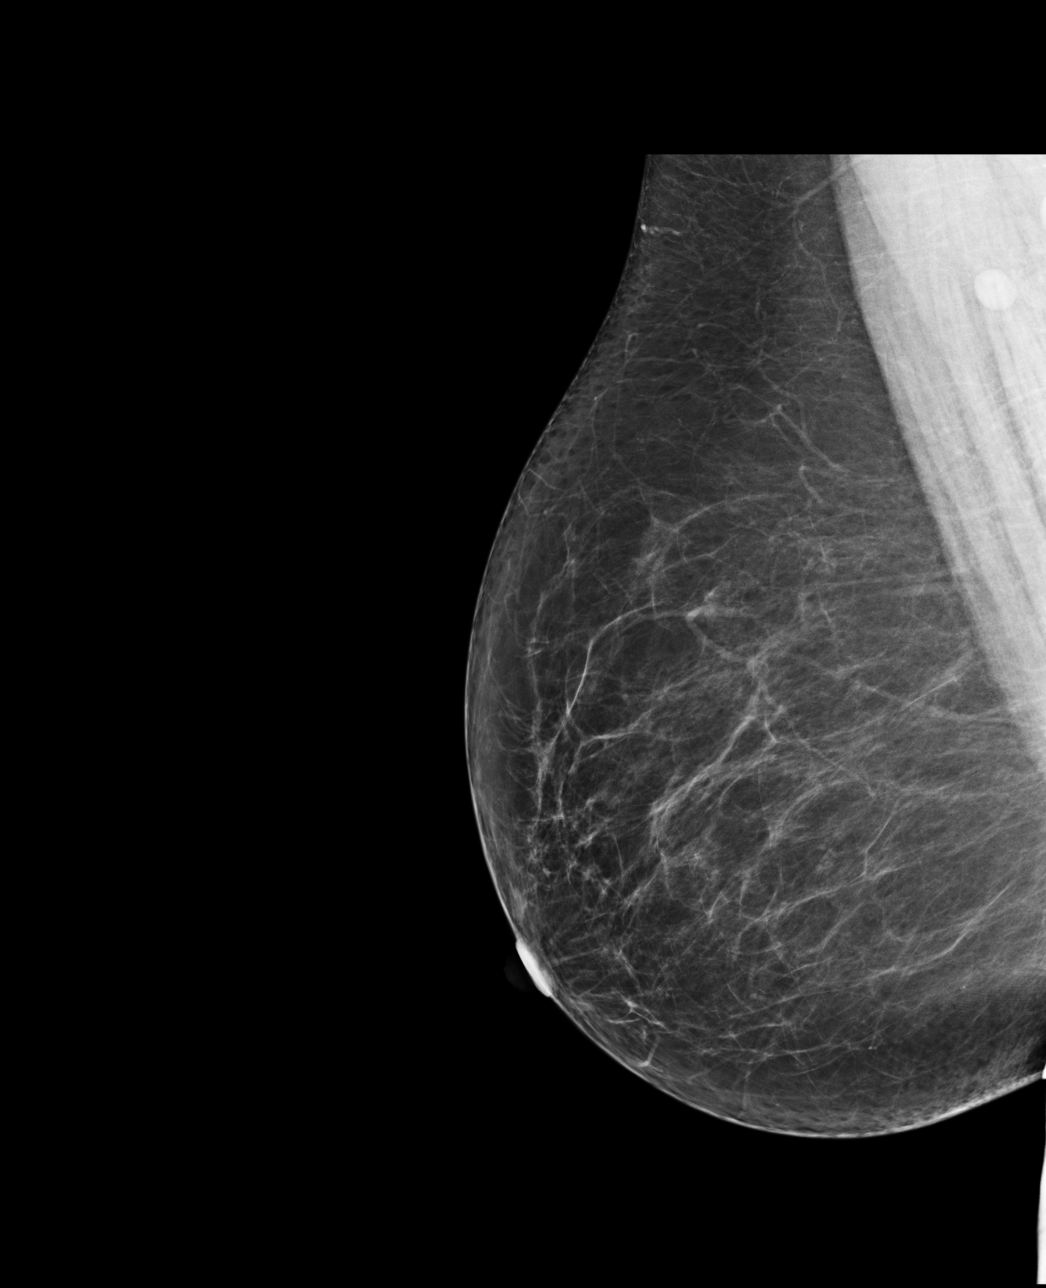

[4 of 4 positions shown; findings below may reference images not displayed]

ACR Breast Density Category b: There are scattered areas of
fibroglandular density.
FINDINGS: There are no findings suspicious for malignancy. Images were
processed with CAD.
IMPRESSION: No mammographic evidence of malignancy. A result letter of this
screening mammogram will be mailed directly to the patient.

RECOMMENDATION:
Screening mammogram in one year. (Code:AS-G-LCT)

BI-RADS CATEGORY  1: Negative.

## 2015-05-19 DIAGNOSIS — G894 Chronic pain syndrome: Secondary | ICD-10-CM | POA: Diagnosis not present

## 2015-05-19 DIAGNOSIS — D869 Sarcoidosis, unspecified: Secondary | ICD-10-CM | POA: Diagnosis not present

## 2015-05-19 DIAGNOSIS — M1990 Unspecified osteoarthritis, unspecified site: Secondary | ICD-10-CM | POA: Diagnosis not present

## 2015-05-19 DIAGNOSIS — E669 Obesity, unspecified: Secondary | ICD-10-CM | POA: Diagnosis not present

## 2015-05-19 DIAGNOSIS — Z23 Encounter for immunization: Secondary | ICD-10-CM | POA: Diagnosis not present

## 2015-07-19 DIAGNOSIS — D869 Sarcoidosis, unspecified: Secondary | ICD-10-CM | POA: Diagnosis not present

## 2015-07-19 DIAGNOSIS — E669 Obesity, unspecified: Secondary | ICD-10-CM | POA: Diagnosis not present

## 2015-07-19 DIAGNOSIS — M1990 Unspecified osteoarthritis, unspecified site: Secondary | ICD-10-CM | POA: Diagnosis not present

## 2015-07-19 DIAGNOSIS — G894 Chronic pain syndrome: Secondary | ICD-10-CM | POA: Diagnosis not present

## 2015-07-20 ENCOUNTER — Ambulatory Visit (HOSPITAL_COMMUNITY)
Admission: RE | Admit: 2015-07-20 | Discharge: 2015-07-20 | Disposition: A | Discharge status: Home / Self Care | Payer: Medicare Other | Source: Ambulatory Visit | Attending: Pulmonary Disease | Admitting: Pulmonary Disease

## 2015-07-20 ENCOUNTER — Other Ambulatory Visit (HOSPITAL_COMMUNITY): Payer: Self-pay | Admitting: Pulmonary Disease

## 2015-07-20 DIAGNOSIS — R918 Other nonspecific abnormal finding of lung field: Secondary | ICD-10-CM | POA: Diagnosis not present

## 2015-07-20 DIAGNOSIS — D869 Sarcoidosis, unspecified: Secondary | ICD-10-CM | POA: Diagnosis not present

## 2015-07-20 DIAGNOSIS — Z87891 Personal history of nicotine dependence: Secondary | ICD-10-CM | POA: Insufficient documentation

## 2015-07-20 DIAGNOSIS — R0602 Shortness of breath: Secondary | ICD-10-CM | POA: Diagnosis not present

## 2015-10-17 DIAGNOSIS — D869 Sarcoidosis, unspecified: Secondary | ICD-10-CM | POA: Diagnosis not present

## 2015-10-17 DIAGNOSIS — E669 Obesity, unspecified: Secondary | ICD-10-CM | POA: Diagnosis not present

## 2015-10-17 DIAGNOSIS — I10 Essential (primary) hypertension: Secondary | ICD-10-CM | POA: Diagnosis not present

## 2015-10-17 DIAGNOSIS — M1990 Unspecified osteoarthritis, unspecified site: Secondary | ICD-10-CM | POA: Diagnosis not present

## 2015-11-27 DIAGNOSIS — H5213 Myopia, bilateral: Secondary | ICD-10-CM | POA: Diagnosis not present

## 2015-11-27 DIAGNOSIS — H52221 Regular astigmatism, right eye: Secondary | ICD-10-CM | POA: Diagnosis not present

## 2015-11-27 DIAGNOSIS — H31002 Unspecified chorioretinal scars, left eye: Secondary | ICD-10-CM | POA: Diagnosis not present

## 2015-11-27 DIAGNOSIS — H524 Presbyopia: Secondary | ICD-10-CM | POA: Diagnosis not present

## 2016-01-18 DIAGNOSIS — D869 Sarcoidosis, unspecified: Secondary | ICD-10-CM | POA: Diagnosis not present

## 2016-01-18 DIAGNOSIS — K389 Disease of appendix, unspecified: Secondary | ICD-10-CM | POA: Diagnosis not present

## 2016-01-18 DIAGNOSIS — E669 Obesity, unspecified: Secondary | ICD-10-CM | POA: Diagnosis not present

## 2016-01-18 DIAGNOSIS — M1990 Unspecified osteoarthritis, unspecified site: Secondary | ICD-10-CM | POA: Diagnosis not present

## 2016-04-29 DIAGNOSIS — G894 Chronic pain syndrome: Secondary | ICD-10-CM | POA: Diagnosis not present

## 2016-04-29 DIAGNOSIS — E669 Obesity, unspecified: Secondary | ICD-10-CM | POA: Diagnosis not present

## 2016-04-29 DIAGNOSIS — D869 Sarcoidosis, unspecified: Secondary | ICD-10-CM | POA: Diagnosis not present

## 2016-04-29 DIAGNOSIS — I10 Essential (primary) hypertension: Secondary | ICD-10-CM | POA: Diagnosis not present

## 2016-04-29 DIAGNOSIS — Z23 Encounter for immunization: Secondary | ICD-10-CM | POA: Diagnosis not present

## 2016-05-06 DIAGNOSIS — D869 Sarcoidosis, unspecified: Secondary | ICD-10-CM | POA: Diagnosis not present

## 2016-05-06 DIAGNOSIS — I10 Essential (primary) hypertension: Secondary | ICD-10-CM | POA: Diagnosis not present

## 2016-05-06 DIAGNOSIS — E669 Obesity, unspecified: Secondary | ICD-10-CM | POA: Diagnosis not present

## 2016-05-06 DIAGNOSIS — G894 Chronic pain syndrome: Secondary | ICD-10-CM | POA: Diagnosis not present

## 2016-06-04 ENCOUNTER — Other Ambulatory Visit (HOSPITAL_COMMUNITY): Payer: Self-pay | Admitting: Pulmonary Disease

## 2016-06-04 DIAGNOSIS — E0590 Thyrotoxicosis, unspecified without thyrotoxic crisis or storm: Secondary | ICD-10-CM

## 2016-06-06 ENCOUNTER — Ambulatory Visit (HOSPITAL_COMMUNITY)
Admission: RE | Admit: 2016-06-06 | Discharge: 2016-06-06 | Disposition: A | Discharge status: Home / Self Care | Payer: Medicare Other | Source: Ambulatory Visit | Attending: Pulmonary Disease | Admitting: Pulmonary Disease

## 2016-06-06 DIAGNOSIS — E0500 Thyrotoxicosis with diffuse goiter without thyrotoxic crisis or storm: Secondary | ICD-10-CM | POA: Diagnosis not present

## 2016-06-06 DIAGNOSIS — E0590 Thyrotoxicosis, unspecified without thyrotoxic crisis or storm: Secondary | ICD-10-CM | POA: Diagnosis not present

## 2016-07-29 DIAGNOSIS — G894 Chronic pain syndrome: Secondary | ICD-10-CM | POA: Diagnosis not present

## 2016-07-29 DIAGNOSIS — F419 Anxiety disorder, unspecified: Secondary | ICD-10-CM | POA: Diagnosis not present

## 2016-07-29 DIAGNOSIS — I10 Essential (primary) hypertension: Secondary | ICD-10-CM | POA: Diagnosis not present

## 2016-07-29 DIAGNOSIS — D869 Sarcoidosis, unspecified: Secondary | ICD-10-CM | POA: Diagnosis not present

## 2016-11-19 ENCOUNTER — Encounter (HOSPITAL_COMMUNITY): Payer: Self-pay | Admitting: Emergency Medicine

## 2016-11-19 ENCOUNTER — Emergency Department (HOSPITAL_COMMUNITY): Payer: Medicare Other

## 2016-11-19 ENCOUNTER — Observation Stay (HOSPITAL_COMMUNITY): Payer: Medicare Other

## 2016-11-19 ENCOUNTER — Inpatient Hospital Stay (HOSPITAL_COMMUNITY)
Admission: EM | Admit: 2016-11-19 | Discharge: 2016-11-22 | DRG: 551 | Disposition: A | Discharge status: Home / Self Care | Payer: Medicare Other | Attending: Pulmonary Disease | Admitting: Pulmonary Disease

## 2016-11-19 DIAGNOSIS — M461 Sacroiliitis, not elsewhere classified: Secondary | ICD-10-CM | POA: Diagnosis not present

## 2016-11-19 DIAGNOSIS — Z79899 Other long term (current) drug therapy: Secondary | ICD-10-CM | POA: Diagnosis not present

## 2016-11-19 DIAGNOSIS — E7800 Pure hypercholesterolemia, unspecified: Secondary | ICD-10-CM | POA: Diagnosis present

## 2016-11-19 DIAGNOSIS — J189 Pneumonia, unspecified organism: Secondary | ICD-10-CM | POA: Diagnosis present

## 2016-11-19 DIAGNOSIS — N390 Urinary tract infection, site not specified: Secondary | ICD-10-CM | POA: Diagnosis not present

## 2016-11-19 DIAGNOSIS — M549 Dorsalgia, unspecified: Secondary | ICD-10-CM | POA: Diagnosis not present

## 2016-11-19 DIAGNOSIS — R109 Unspecified abdominal pain: Secondary | ICD-10-CM | POA: Diagnosis not present

## 2016-11-19 DIAGNOSIS — K219 Gastro-esophageal reflux disease without esophagitis: Secondary | ICD-10-CM | POA: Diagnosis present

## 2016-11-19 DIAGNOSIS — K5900 Constipation, unspecified: Secondary | ICD-10-CM | POA: Diagnosis not present

## 2016-11-19 DIAGNOSIS — Z87891 Personal history of nicotine dependence: Secondary | ICD-10-CM | POA: Diagnosis not present

## 2016-11-19 DIAGNOSIS — M5126 Other intervertebral disc displacement, lumbar region: Secondary | ICD-10-CM | POA: Diagnosis not present

## 2016-11-19 DIAGNOSIS — M5442 Lumbago with sciatica, left side: Secondary | ICD-10-CM

## 2016-11-19 DIAGNOSIS — R079 Chest pain, unspecified: Secondary | ICD-10-CM | POA: Diagnosis not present

## 2016-11-19 DIAGNOSIS — D869 Sarcoidosis, unspecified: Secondary | ICD-10-CM | POA: Diagnosis not present

## 2016-11-19 DIAGNOSIS — M25552 Pain in left hip: Secondary | ICD-10-CM | POA: Diagnosis not present

## 2016-11-19 LAB — COMPREHENSIVE METABOLIC PANEL
ALT: 16 U/L (ref 14–54)
AST: 20 U/L (ref 15–41)
Albumin: 4.2 g/dL (ref 3.5–5.0)
Alkaline Phosphatase: 68 U/L (ref 38–126)
Anion gap: 6 (ref 5–15)
BILIRUBIN TOTAL: 0.6 mg/dL (ref 0.3–1.2)
BUN: 11 mg/dL (ref 6–20)
CO2: 28 mmol/L (ref 22–32)
Calcium: 9.5 mg/dL (ref 8.9–10.3)
Chloride: 104 mmol/L (ref 101–111)
Creatinine, Ser: 0.74 mg/dL (ref 0.44–1.00)
GFR calc Af Amer: >60 mL/min (ref >60)
GFR calc non Af Amer: >60 mL/min (ref >60)
GLUCOSE: 95 mg/dL (ref 65–99)
POTASSIUM: 3.8 mmol/L (ref 3.5–5.1)
Sodium: 138 mmol/L (ref 135–145)
Total Protein: 9 g/dL — ABNORMAL HIGH (ref 6.5–8.1)

## 2016-11-19 LAB — CBC
HEMATOCRIT: 38 % (ref 36.0–46.0)
Hemoglobin: 12.6 g/dL (ref 12.0–15.0)
MCH: 27.6 pg (ref 26.0–34.0)
MCHC: 33.2 g/dL (ref 30.0–36.0)
MCV: 83.3 fL (ref 78.0–100.0)
Platelets: 239 10*3/uL (ref 150–400)
RBC: 4.56 MIL/uL (ref 3.87–5.11)
RDW: 15 % (ref 11.5–15.5)
WBC: 4.4 10*3/uL (ref 4.0–10.5)

## 2016-11-19 LAB — URINALYSIS, ROUTINE W REFLEX MICROSCOPIC
Bilirubin Urine: NEGATIVE
Glucose, UA: NEGATIVE mg/dL
Ketones, ur: NEGATIVE mg/dL
Nitrite: POSITIVE — AB
PH: 5 (ref 5.0–8.0)
Protein, ur: NEGATIVE mg/dL
Specific Gravity, Urine: 1.012 (ref 1.005–1.030)

## 2016-11-19 LAB — LIPASE, BLOOD: Lipase: 16 U/L (ref 11–51)

## 2016-11-19 MED ORDER — MAGNESIUM CITRATE PO SOLN
1.0000 | Freq: Once | ORAL | Status: AC
  Administered 2016-11-19: 1 via ORAL
  Filled 2016-11-19: qty 296

## 2016-11-19 MED ORDER — SODIUM CHLORIDE 0.9 % IV BOLUS (SEPSIS)
1000.0000 mL | Freq: Once | INTRAVENOUS | Status: AC
  Administered 2016-11-19: 1000 mL via INTRAVENOUS

## 2016-11-19 MED ORDER — ENOXAPARIN SODIUM 40 MG/0.4ML ~~LOC~~ SOLN
40.0000 mg | SUBCUTANEOUS | Status: DC
  Administered 2016-11-19 – 2016-11-21 (×3): 40 mg via SUBCUTANEOUS
  Filled 2016-11-19 (×3): qty 0.4

## 2016-11-19 MED ORDER — AMITRIPTYLINE HCL 10 MG PO TABS
10.0000 mg | ORAL_TABLET | Freq: Every day | ORAL | Status: DC
  Administered 2016-11-19 – 2016-11-21 (×3): 10 mg via ORAL
  Filled 2016-11-19 (×3): qty 1

## 2016-11-19 MED ORDER — LEVOFLOXACIN 750 MG PO TABS
750.0000 mg | ORAL_TABLET | Freq: Every day | ORAL | Status: DC
  Administered 2016-11-20 – 2016-11-22 (×3): 750 mg via ORAL
  Filled 2016-11-19 (×3): qty 1

## 2016-11-19 MED ORDER — ACETAMINOPHEN 325 MG PO TABS: 650.0000 mg | ORAL_TABLET | Freq: Four times a day (QID) | ORAL | Status: DC | PRN

## 2016-11-19 MED ORDER — DEXTROSE 5 % IV SOLN
500.0000 mg | Freq: Once | INTRAVENOUS | Status: AC
  Administered 2016-11-19: 500 mg via INTRAVENOUS
  Filled 2016-11-19: qty 500

## 2016-11-19 MED ORDER — HYDROMORPHONE HCL 1 MG/ML IJ SOLN
1.0000 mg | INTRAMUSCULAR | Status: DC | PRN
  Administered 2016-11-19 – 2016-11-22 (×13): 1 mg via INTRAVENOUS
  Filled 2016-11-19 (×14): qty 1

## 2016-11-19 MED ORDER — HYDROCODONE-ACETAMINOPHEN 5-325 MG PO TABS
1.0000 | ORAL_TABLET | ORAL | Status: DC | PRN
  Administered 2016-11-19 (×3): 1 via ORAL
  Administered 2016-11-20 – 2016-11-22 (×11): 2 via ORAL
  Filled 2016-11-19 (×2): qty 2
  Filled 2016-11-19: qty 1
  Filled 2016-11-19 (×4): qty 2
  Filled 2016-11-19: qty 1
  Filled 2016-11-19 (×4): qty 2
  Filled 2016-11-19: qty 1
  Filled 2016-11-19: qty 2

## 2016-11-19 MED ORDER — ACETAMINOPHEN 500 MG PO TABS
1000.0000 mg | ORAL_TABLET | Freq: Once | ORAL | Status: AC
  Administered 2016-11-19: 1000 mg via ORAL
  Filled 2016-11-19: qty 2

## 2016-11-19 MED ORDER — SODIUM CHLORIDE 0.9 % IV SOLN
INTRAVENOUS | Status: DC
  Administered 2016-11-19 – 2016-11-22 (×5): via INTRAVENOUS

## 2016-11-19 MED ORDER — ONDANSETRON HCL 4 MG PO TABS: 4.0000 mg | ORAL_TABLET | Freq: Four times a day (QID) | ORAL | Status: DC | PRN

## 2016-11-19 MED ORDER — HYDROMORPHONE HCL 1 MG/ML IJ SOLN
1.0000 mg | Freq: Once | INTRAMUSCULAR | Status: AC
  Administered 2016-11-19: 1 mg via INTRAVENOUS
  Filled 2016-11-19: qty 1

## 2016-11-19 MED ORDER — ACETAMINOPHEN 650 MG RE SUPP: 650.0000 mg | Freq: Four times a day (QID) | RECTAL | Status: DC | PRN

## 2016-11-19 MED ORDER — ALPRAZOLAM 1 MG PO TABS: 1.0000 mg | ORAL_TABLET | Freq: Every evening | ORAL | Status: DC | PRN

## 2016-11-19 MED ORDER — ATORVASTATIN CALCIUM 40 MG PO TABS
40.0000 mg | ORAL_TABLET | Freq: Every day | ORAL | Status: DC
  Administered 2016-11-19 – 2016-11-21 (×3): 40 mg via ORAL
  Filled 2016-11-19 (×3): qty 1

## 2016-11-19 MED ORDER — ONDANSETRON HCL 4 MG/2ML IJ SOLN: 4.0000 mg | Freq: Four times a day (QID) | INTRAMUSCULAR | Status: DC | PRN

## 2016-11-19 MED ORDER — CYCLOBENZAPRINE HCL 10 MG PO TABS
10.0000 mg | ORAL_TABLET | Freq: Two times a day (BID) | ORAL | Status: DC
  Administered 2016-11-19 – 2016-11-22 (×7): 10 mg via ORAL
  Filled 2016-11-19 (×7): qty 1

## 2016-11-19 MED ORDER — DEXTROSE 5 % IV SOLN
1.0000 g | Freq: Once | INTRAVENOUS | Status: AC
  Administered 2016-11-19: 1 g via INTRAVENOUS
  Filled 2016-11-19: qty 10

## 2016-11-19 NOTE — ED Triage Notes (Signed)
Pt c/o "ulcers in my stomach" x 3 days. Has been taking hydrocodone with no relief. Pt then states she has sarcoidosis and has sharp pains everywhere. nad. Pt moving slow due to pain

## 2016-11-19 NOTE — ED Notes (Signed)
Waiting for blood cultures to be drawn before giving antibiotics

## 2016-11-19 NOTE — ED Notes (Signed)
Pt taken to CT.

## 2016-11-19 NOTE — ED Provider Notes (Signed)
[facility]-EM[facility]Y DEPT Provider Note   CSN: [id number] Arrival date & time: 11/19/16  0707   By signing my name below, I, Clarisse Gouge, attest that this documentation has been prepared under the direction and in the presence of Pricilla Loveless, MD. Electronically signed, Clarisse Gouge, ED Scribe. 11/19/16. 8:29 AM.  History   Chief Complaint Chief Complaint  Patient presents with  . Abdominal Pain   The history is provided by the patient and medical records. No language interpreter was used.    [name] is a 56 y.o. female with h/o GERD, Cholecystectomy, Appendectomy, pulmonary sarcoidosis and hysterectomy, who presents to the Emergency Department with concern for gradually worsening, severe abdominal pain x 3 days. Pt describes 10/10, constant, sharp on L, dull on the R, L > R abdominal pain radiating around to the mid-lower back and down into the legs. She states this is improved with rest and certain positions, worsened with movement. She notes this is consistent with h/o sarcoidosis, her symptoms are more severe than normal, and she states she is followed by Dr. Juanetta Gosling for this issue. She states she has taken prescribed hydrocodone and muscle relaxants for the issue without relief to her pain. She adds she was prescribed prednisone in the past with varying levels of relief though she has since been weaned from it for over one year. Pt denies joint swelling, fever, N/V/D, cough, weakness, chest pain, numbness, recent illness, dysuria or Hx of heart attack. Pt reports upcoming appointment with Dr. Juanetta Gosling on 11/23/2016.   Past Medical History:  Diagnosis Date  . Chest pain   . GERD (gastroesophageal reflux disease)   . Hypercholesterolemia   . Pneumonia   . Pulmonary sarcoidosis (HCC)    Treatment with Prednisone    Patient Active Problem List   Diagnosis Date Noted  . Pneumonia 11/19/2016  . Back pain 11/19/2016  . Constipation 04/27/2012  . GASTROESOPHAGEAL REFLUX DISEASE  06/26/2009  . SARCOIDOSIS, PULMONARY 06/23/2009  . PNEUMONIA 06/23/2009  . CHEST PAIN 06/23/2009    Past Surgical History:  Procedure Laterality Date  . ABDOMINAL HYSTERECTOMY    . APPENDECTOMY    . CHOLECYSTECTOMY      OB History    No data available       Home Medications    Prior to Admission medications   Medication Sig Start Date End Date Taking? Authorizing Provider  ALPRAZolam Prudy Feeler) 1 MG tablet Take 1 mg by mouth at bedtime as needed. For sleep   Yes Historical Provider, MD  amitriptyline (ELAVIL) 10 MG tablet Take 10 mg by mouth.   Yes Historical Provider, MD  atorvastatin (LIPITOR) 40 MG tablet Take 40 mg by mouth daily.   Yes Historical Provider, MD  cyclobenzaprine (FLEXERIL) 10 MG tablet Take 1 tablet by mouth 2 (two) times daily. 10/24/16  Yes Historical Provider, MD  HYDROcodone-acetaminophen (VICODIN) 5-500 MG per tablet Take 1 tablet by mouth at bedtime as needed. For pain   Yes Historical Provider, MD  ibuprofen (ADVIL,MOTRIN) 200 MG tablet Take 400 mg by mouth every 6 (six) hours as needed. For pain    Historical Provider, MD  traMADol (ULTRAM) 50 MG tablet Take 50 mg by mouth daily as needed. For pain    Historical Provider, MD    Family History Family History  Problem Relation Age of Onset  . Hypertension Mother   . Lupus Father   . Colon cancer Neg Hx     Social History Social History  Substance Use Topics  .  Smoking status: Former Smoker    Packs/day: 0.20    Years: 30.00    Types: Cigarettes    Quit date: 11/19/2012  . Smokeless tobacco: Never Used  . Alcohol use No     Allergies   Aspirin   Review of Systems Review of Systems  Constitutional: Negative for fever.  Respiratory: Negative for cough.   Gastrointestinal: Positive for abdominal pain. Negative for diarrhea, nausea and vomiting.  Genitourinary: Negative for dysuria.  Musculoskeletal: Positive for arthralgias, back pain, gait problem and myalgias. Negative for joint  swelling.  Neurological: Negative for weakness and numbness.  All other systems reviewed and are negative.     Physical Exam Updated Vital Signs BP (!) 153/83 (BP Location: Right Arm)   Pulse 78   Temp 98.4 F (36.9 C) (Oral)   Resp 18   Ht 5' 2.5" (1.588 m)   Wt 200 lb (90.7 kg)   SpO2 99%   BMI 36.00 kg/m   Physical Exam  Constitutional: She is oriented to person, place, and time. She appears well-developed and well-nourished.  HENT:  Head: Normocephalic and atraumatic.  Right Ear: External ear normal.  Left Ear: External ear normal.  Nose: Nose normal.  Eyes: Right eye exhibits no discharge. Left eye exhibits no discharge.  Cardiovascular: Normal rate, regular rhythm and normal heart sounds.   Pulses:      Radial pulses are 2+ on the right side, and 2+ on the left side.       Dorsalis pedis pulses are 2+ on the right side, and 2+ on the left side.  Pulmonary/Chest: Effort normal and breath sounds normal.  Abdominal: Soft. There is tenderness in the left upper quadrant and left lower quadrant. There is CVA tenderness (L sided).  Musculoskeletal:  No joint swelling noted in all 4 extremities; diffuse pain to minimal palpation of her back and lower extremities; no skin changes  Neurological: She is alert and oriented to person, place, and time.  5/5 strength in all 4 extremities; NL gross sensation  Skin: Skin is warm and dry.  Nursing note and vitals reviewed.    ED Treatments / Results  DIAGNOSTIC STUDIES: Oxygen Saturation is 99% on RA, normal by my interpretation.    COORDINATION OF CARE: 8:24 AM Discussed treatment plan with pt at bedside and pt agreed to plan. Will order labs and reassess.  Labs (all labs ordered are listed, but only abnormal results are displayed) Labs Reviewed  COMPREHENSIVE METABOLIC PANEL - Abnormal; Notable for the following:       Result Value   Total Protein 9.0 (*)    All other components within normal limits  URINALYSIS, ROUTINE W  REFLEX MICROSCOPIC - Abnormal; Notable for the following:    APPearance HAZY (*)    Hgb urine dipstick SMALL (*)    Nitrite POSITIVE (*)    Leukocytes, UA MODERATE (*)    Bacteria, UA MANY (*)    Squamous Epithelial / LPF 0-5 (*)    All other components within normal limits  CULTURE, BLOOD (ROUTINE X 2)  CULTURE, BLOOD (ROUTINE X 2)  URINE CULTURE  LIPASE, BLOOD  CBC  HIV ANTIBODY (ROUTINE TESTING)    EKG  EKG Interpretation None       Radiology Dg Chest 2 View  Result Date: 11/19/2016 CLINICAL DATA:  Pulmonary sarcoidosis.  Chest pain. EXAM: CHEST  2 VIEW COMPARISON:  07/20/2015 chest radiograph. FINDINGS: Stable cardiomediastinal silhouette with top-normal heart size. No pneumothorax. No pleural effusion. Irregular  patchy opacities in the mid to lower lungs bilaterally, not convincingly changed in the interval since 07/20/2015. No acute consolidative airspace disease. Cholecystectomy clips are seen in the right upper quadrant of the abdomen. IMPRESSION: No acute cardiopulmonary disease. Chronic irregular patchy opacities in the mid to lower lungs bilaterally, not convincingly changed since 07/20/2015, presumably representing chronic sequela of pulmonary sarcoidosis or postinfectious scarring. Electronically Signed   By: Delbert Phenix M.D.   On: 11/19/2016 10:01   Ct Renal Stone Study  Result Date: 11/19/2016 CLINICAL DATA:  Left flank pain and hip pain for 3 days EXAM: CT ABDOMEN AND PELVIS WITHOUT CONTRAST TECHNIQUE: Multidetector CT imaging of the abdomen and pelvis was performed following the standard protocol without IV contrast. COMPARISON:  12/25/2006 FINDINGS: Lower chest: Lung bases shows bilateral streaky infiltrate lower lobe posteriorly left greater than right highly suspicious for bilateral pneumonia. Additional streaky infiltrate is noted in right middle lobe. Small patchy nodular infiltrate is noted in left base anterolaterally. Followup examination after appropriate  treatment is recommended to assure resolution. Hepatobiliary: Unenhanced liver shows no biliary ductal dilatation. The patient is status post cholecystectomy. Pancreas: Unenhanced pancreas without focal abnormality. Spleen: Unenhanced spleen with normal appearance. Adrenals/Urinary Tract: Unenhanced kidneys are symmetrical in size. No hydronephrosis or hydroureter. A tiny vascular calcification is noted in right renal hilum. No calcified ureteral calculi. No calcified calculi are noted within empty urinary bladder. Stomach/Bowel: There is no small bowel obstruction. No gastric outlet obstruction. Moderate stool noted in cecum and right colon. Moderate stool noted within transverse colon. The terminal ileum is unremarkable. The patient is status post appendectomy. Some stridor moderate stool and some colonic gas noted within descending colon. Few diverticula are noted proximal sigmoid colon. Distal sigmoid colon and rectum is empty collapsed. No distal colitis or diverticulitis. Vascular/Lymphatic: No aortic aneurysm. Atherosclerotic calcifications of abdominal aorta and iliac arteries. No retroperitoneal or mesenteric adenopathy. Reproductive: The patient is status post hysterectomy Other: No ascites or free abdominal air.  No inguinal adenopathy. Musculoskeletal: No destructive bony lesions are noted. Mild degenerative changes bilateral SI joints. Coronal image 56 there are subtle sclerotic changes noted superolateral aspect of the left femoral head. Although may be degenerative in nature avascular necrosis cannot be excluded. Further correlation with MRI could be performed as clinically warranted. IMPRESSION: 1. Bilateral lung lower lobe streaky consolidation with air bronchogram left greater than right. There is additional consolidation with air bronchogram in right middle lobe. Findings highly suspicious for multifocal pneumonia. Follow-up CT scan of the chest in 1 month after appropriate treatment is  recommended to assure resolution and exclude malignancy. 2. No nephrolithiasis. No hydronephrosis or hydroureter. No calcified ureteral calculi. 3. Status post cholecystectomy. 4. Moderate stool noted in right colon and transverse colon. No pericecal inflammation. Status post appendectomy. Few diverticula are noted proximal sigmoid colon. No distal colitis or diverticulitis. No distal colonic obstruction. 5. No small bowel obstruction. 6. Status post hysterectomy. 7. Degenerative changes bilateral SI joints. Coronal image 56 there are subtle sclerotic changes noted superolateral aspect of the left femoral head. Although may be degenerative in nature avascular necrosis cannot be excluded. Further correlation with MRI could be performed as clinically warranted. Electronically Signed   By: Natasha Mead M.D.   On: 11/19/2016 09:21    Procedures Procedures (including critical care time)  Medications Ordered in ED Medications  HYDROmorphone (DILAUDID) injection 1 mg (not administered)  atorvastatin (LIPITOR) tablet 40 mg (not administered)  cyclobenzaprine (FLEXERIL) tablet 10 mg (10 mg Oral  Given 11/19/16 1606)  amitriptyline (ELAVIL) tablet 10 mg (not administered)  ALPRAZolam (XANAX) tablet 1 mg (not administered)  enoxaparin (LOVENOX) injection 40 mg (40 mg Subcutaneous Given 11/19/16 1606)  0.9 %  sodium chloride infusion ( Intravenous New Bag/Given 11/19/16 1606)  acetaminophen (TYLENOL) tablet 650 mg (not administered)    Or  acetaminophen (TYLENOL) suppository 650 mg (not administered)  ondansetron (ZOFRAN) tablet 4 mg (not administered)    Or  ondansetron (ZOFRAN) injection 4 mg (not administered)  HYDROcodone-acetaminophen (NORCO/VICODIN) 5-325 MG per tablet 1-2 tablet (1 tablet Oral Given 11/19/16 1606)  levofloxacin (LEVAQUIN) tablet 750 mg (not administered)  HYDROmorphone (DILAUDID) injection 1 mg (1 mg Intravenous Given 11/19/16 0848)  sodium chloride 0.9 % bolus 1,000 mL (0 mLs Intravenous  Stopped 11/19/16 1000)  HYDROmorphone (DILAUDID) injection 1 mg (1 mg Intravenous Given 11/19/16 1011)  acetaminophen (TYLENOL) tablet 1,000 mg (1,000 mg Oral Given 11/19/16 1010)  cefTRIAXone (ROCEPHIN) 1 g in dextrose 5 % 50 mL IVPB (0 g Intravenous Stopped 11/19/16 1159)  azithromycin (ZITHROMAX) 500 mg in dextrose 5 % 250 mL IVPB (0 mg Intravenous Stopped 11/19/16 1305)  magnesium citrate solution 1 Bottle (1 Bottle Oral Given 11/19/16 1606)     Initial Impression / Assessment and Plan / ED Course  I have reviewed the triage vital signs and the nursing notes.  Pertinent labs & imaging results that were available during my care of the patient were reviewed by me and considered in my medical decision making (see chart for details).  Clinical Course as of Nov 20 1651  Tue Nov 19, 2016  1610 Given most of the pain is left abd/flank, will get CT to r/o stone. IV dilaudid. She is hesitant to do steroids again, will consult her PCP after workup is concluded.  [SG]  1053 Patient's pain is still poorly controlled. UA concerning for UTI. No symptoms. CT shows possible multifocal PNA, although sarcoid changes could cause this as well (no cough). Given degree of pain, minimal movement sets off and unable to get out of bed at this time due to it, will consult hospitalist for admission  [SG]  1115 Memon to admit  [SG]    Clinical Course User Index [SG] Pricilla Loveless, MD    Patient's pain remains poorly controlled. Admit to hospitalist for pain control, IV antibiotics.   Final Clinical Impressions(s) / ED Diagnoses   Final diagnoses:  Acute UTI    New Prescriptions Current Discharge Medication List     I personally performed the services described in this documentation, which was scribed in my presence. The recorded information has been reviewed and is accurate.    Pricilla Loveless, MD 11/19/16 [phone]

## 2016-11-19 NOTE — H&P (Signed)
History and Physical    [name] ZOX:[id number] DOB: 1961-04-08 DOA: 11/19/2016  PCP: Fredirick Maudlin, MD  Patient coming from: home  I have personally briefly reviewed patient's old medical records in [facility]  Chief Complaint: back pain  HPI: [name] is a 56 y.o. female with medical history significant of sarcoidosis who has been chronically treated with prednisone, but has been off steroids for approximately 1 year now. She reports that for about 3 days now, she has worsening lower back pain which is mostly on the left side, radiates around her abdomen as well as down her left leg. Describes the pain as dull and achy with periods of sharp pain with radiation that occur mostly with movement. She denies any fever. She's not had any change in cough. No shortness of breath. No dysuria. No nausea or vomiting. She reports her bowel movements have been normal. She did not describe any falls, trauma. She has not done any heavy lifting. She reports that the pain occurred while she had gotten up to use a bathroom during the night and has progressively gotten worse over the last 3 days.  ED Course: Vitals were noted to be stable. Basic labs including CBC and chemistries were unremarkable. CT scan of the abdomen indicated possible pneumonia at the bases of the lungs. It also revealed constipation. There was question of possible avascular necrosis of the left hip. Patient was unable to stand and her pain was not managed. She's been referred for observation.  Review of Systems: As per HPI otherwise 10 point review of systems negative.    Past Medical History:  Diagnosis Date  . Chest pain   . GERD (gastroesophageal reflux disease)   . Hypercholesterolemia   . Pneumonia   . Pulmonary sarcoidosis (HCC)    Treatment with Prednisone    Past Surgical History:  Procedure Laterality Date  . ABDOMINAL HYSTERECTOMY    . APPENDECTOMY    . CHOLECYSTECTOMY       reports that she  quit smoking about 4 years ago. Her smoking use included Cigarettes. She has a 6.00 pack-year smoking history. She has never used smokeless tobacco. She reports that she does not drink alcohol or use drugs.  Allergies  Allergen Reactions  . Aspirin     Family History  Problem Relation Age of Onset  . Hypertension Mother   . Lupus Father   . Colon cancer Neg Hx      Prior to Admission medications   Medication Sig Start Date End Date Taking? Authorizing Provider  ALPRAZolam Prudy Feeler) 1 MG tablet Take 1 mg by mouth at bedtime as needed. For sleep   Yes Historical Provider, MD  amitriptyline (ELAVIL) 10 MG tablet Take 10 mg by mouth.   Yes Historical Provider, MD  atorvastatin (LIPITOR) 40 MG tablet Take 40 mg by mouth daily.   Yes Historical Provider, MD  cyclobenzaprine (FLEXERIL) 10 MG tablet Take 1 tablet by mouth 2 (two) times daily. 10/24/16  Yes Historical Provider, MD  HYDROcodone-acetaminophen (VICODIN) 5-500 MG per tablet Take 1 tablet by mouth at bedtime as needed. For pain   Yes Historical Provider, MD  ibuprofen (ADVIL,MOTRIN) 200 MG tablet Take 400 mg by mouth every 6 (six) hours as needed. For pain    Historical Provider, MD  traMADol (ULTRAM) 50 MG tablet Take 50 mg by mouth daily as needed. For pain    Historical Provider, MD    Physical Exam: Vitals:   11/19/16 0728 11/19/16 0930 11/19/16  1100 11/19/16 1251  BP: (!) 153/83 (!) 139/98 (!) 149/93 (!) 111/46  Pulse: 78 71 70 74  Resp: Temp: 98.4 F (36.9 C)   97.7 F (36.5 C)  TempSrc: Oral   Oral  SpO2: 99% 92% 90% 99%  Weight:    90.7 kg (200 lb)  Height:    5' 2.5" (1.588 m)    Constitutional: NAD, calm, comfortable Vitals:   11/19/16 0728 11/19/16 0930 11/19/16 1100 11/19/16 1251  BP: (!) 153/83 (!) 139/98 (!) 149/93 (!) 111/46  Pulse: 78 71 70 74  Resp: Temp: 98.4 F (36.9 C)   97.7 F (36.5 C)  TempSrc: Oral   Oral  SpO2: 99% 92% 90% 99%  Weight:    90.7 kg (200 lb)  Height:     5' 2.5" (1.588 m)   Eyes: PERRL, lids and conjunctivae normal ENMT: Mucous membranes are moist. Posterior pharynx clear of any exudate or lesions.Normal dentition.  Neck: normal, supple, no masses, no thyromegaly Respiratory: clear to auscultation bilaterally, no wheezing, no crackles. Normal respiratory effort. No accessory muscle use.  Cardiovascular: Regular rate and rhythm, no murmurs / rubs / gallops. No extremity edema. 2+ pedal pulses. No carotid bruits.  Abdomen: no tenderness, no masses palpated. No hepatosplenomegaly. Bowel sounds positive.  Musculoskeletal: no clubbing / cyanosis. No joint deformity upper and lower extremities. Good ROM, no contractures. Normal muscle tone.  Skin: no rashes, lesions, ulcers. No induration Neurologic: CN 2-12 grossly intact. Sensation intact, DTR normal. Strength 4/5 in LLE, limited due to pain, otherwise 5/5 in remaining extremities  Psychiatric: Normal judgment and insight. Alert and oriented x 3. Normal mood.     Labs on Admission: I have personally reviewed following labs and imaging studies  CBC:  Recent Labs Lab 11/19/16 0747  WBC 4.4  HGB 12.6  HCT 38.0  MCV 83.3  PLT 239   Basic Metabolic Panel:  Recent Labs Lab 11/19/16 0747  NA 138  K 3.8  CL 104  CO2 28  GLUCOSE 95  BUN 11  CREATININE 0.74  CALCIUM 9.5   GFR: Estimated Creatinine Clearance: 84.2 mL/min (by C-G formula based on SCr of 0.74 mg/dL). Liver Function Tests:  Recent Labs Lab 11/19/16 0747  AST 20  ALT 16  ALKPHOS 68  BILITOT 0.6  PROT 9.0*  ALBUMIN 4.2    Recent Labs Lab 11/19/16 0747  LIPASE 16   No results for input(s): AMMONIA in the last 168 hours. Coagulation Profile: No results for input(s): INR, PROTIME in the last 168 hours. Cardiac Enzymes: No results for input(s): CKTOTAL, CKMB, CKMBINDEX, TROPONINI in the last 168 hours. BNP (last 3 results) No results for input(s): PROBNP in the last 8760 hours. HbA1C: No results for  input(s): HGBA1C in the last 72 hours. CBG: No results for input(s): GLUCAP in the last 168 hours. Lipid Profile: No results for input(s): CHOL, HDL, LDLCALC, TRIG, CHOLHDL, LDLDIRECT in the last 72 hours. Thyroid Function Tests: No results for input(s): TSH, T4TOTAL, FREET4, T3FREE, THYROIDAB in the last 72 hours. Anemia Panel: No results for input(s): VITAMINB12, FOLATE, FERRITIN, TIBC, IRON, RETICCTPCT in the last 72 hours. Urine analysis:    Component Value Date/Time   COLORURINE YELLOW 11/19/2016 0820   APPEARANCEUR HAZY (A) 11/19/2016 0820   LABSPEC 1.012 11/19/2016 0820   PHURINE 5.0 11/19/2016 0820   GLUCOSEU NEGATIVE 11/19/2016 0820   HGBUR SMALL (A) 11/19/2016 0820   BILIRUBINUR NEGATIVE 11/19/2016  0820   KETONESUR NEGATIVE 11/19/2016 0820   PROTEINUR NEGATIVE 11/19/2016 0820   UROBILINOGEN 0.2 05/13/2010 1636   NITRITE POSITIVE (A) 11/19/2016 0820   LEUKOCYTESUR MODERATE (A) 11/19/2016 0820    Radiological Exams on Admission: Dg Chest 2 View  Result Date: 11/19/2016 CLINICAL DATA:  Pulmonary sarcoidosis.  Chest pain. EXAM: CHEST  2 VIEW COMPARISON:  07/20/2015 chest radiograph. FINDINGS: Stable cardiomediastinal silhouette with top-normal heart size. No pneumothorax. No pleural effusion. Irregular patchy opacities in the mid to lower lungs bilaterally, not convincingly changed in the interval since 07/20/2015. No acute consolidative airspace disease. Cholecystectomy clips are seen in the right upper quadrant of the abdomen. IMPRESSION: No acute cardiopulmonary disease. Chronic irregular patchy opacities in the mid to lower lungs bilaterally, not convincingly changed since 07/20/2015, presumably representing chronic sequela of pulmonary sarcoidosis or postinfectious scarring. Electronically Signed   By: Delbert Phenix M.D.   On: 11/19/2016 10:01   Ct Renal Stone Study  Result Date: 11/19/2016 CLINICAL DATA:  Left flank pain and hip pain for 3 days EXAM: CT ABDOMEN AND PELVIS  WITHOUT CONTRAST TECHNIQUE: Multidetector CT imaging of the abdomen and pelvis was performed following the standard protocol without IV contrast. COMPARISON:  12/25/2006 FINDINGS: Lower chest: Lung bases shows bilateral streaky infiltrate lower lobe posteriorly left greater than right highly suspicious for bilateral pneumonia. Additional streaky infiltrate is noted in right middle lobe. Small patchy nodular infiltrate is noted in left base anterolaterally. Followup examination after appropriate treatment is recommended to assure resolution. Hepatobiliary: Unenhanced liver shows no biliary ductal dilatation. The patient is status post cholecystectomy. Pancreas: Unenhanced pancreas without focal abnormality. Spleen: Unenhanced spleen with normal appearance. Adrenals/Urinary Tract: Unenhanced kidneys are symmetrical in size. No hydronephrosis or hydroureter. A tiny vascular calcification is noted in right renal hilum. No calcified ureteral calculi. No calcified calculi are noted within empty urinary bladder. Stomach/Bowel: There is no small bowel obstruction. No gastric outlet obstruction. Moderate stool noted in cecum and right colon. Moderate stool noted within transverse colon. The terminal ileum is unremarkable. The patient is status post appendectomy. Some stridor moderate stool and some colonic gas noted within descending colon. Few diverticula are noted proximal sigmoid colon. Distal sigmoid colon and rectum is empty collapsed. No distal colitis or diverticulitis. Vascular/Lymphatic: No aortic aneurysm. Atherosclerotic calcifications of abdominal aorta and iliac arteries. No retroperitoneal or mesenteric adenopathy. Reproductive: The patient is status post hysterectomy Other: No ascites or free abdominal air.  No inguinal adenopathy. Musculoskeletal: No destructive bony lesions are noted. Mild degenerative changes bilateral SI joints. Coronal image 56 there are subtle sclerotic changes noted superolateral aspect  of the left femoral head. Although may be degenerative in nature avascular necrosis cannot be excluded. Further correlation with MRI could be performed as clinically warranted. IMPRESSION: 1. Bilateral lung lower lobe streaky consolidation with air bronchogram left greater than right. There is additional consolidation with air bronchogram in right middle lobe. Findings highly suspicious for multifocal pneumonia. Follow-up CT scan of the chest in 1 month after appropriate treatment is recommended to assure resolution and exclude malignancy. 2. No nephrolithiasis. No hydronephrosis or hydroureter. No calcified ureteral calculi. 3. Status post cholecystectomy. 4. Moderate stool noted in right colon and transverse colon. No pericecal inflammation. Status post appendectomy. Few diverticula are noted proximal sigmoid colon. No distal colitis or diverticulitis. No distal colonic obstruction. 5. No small bowel obstruction. 6. Status post hysterectomy. 7. Degenerative changes bilateral SI joints. Coronal image 56 there are subtle sclerotic changes noted superolateral aspect  of the left femoral head. Although may be degenerative in nature avascular necrosis cannot be excluded. Further correlation with MRI could be performed as clinically warranted. Electronically Signed   By: Natasha Mead M.D.   On: 11/19/2016 09:21    Assessment/Plan Active Problems:   SARCOIDOSIS, PULMONARY   GASTROESOPHAGEAL REFLUX DISEASE   Constipation   Pneumonia   Back pain     1. Back pain. Etiology is unclear. She will likely need MRI imaging of her lumbar spine and left hip. Continue with pain management. Physical therapy consultation  2. Possible community acquired pneumonia. Imaging indicates possible pneumonia, although I'm not sure if this is related to her sarcoid. She's been started on antibiotics. Will continue on oral Levaquin.  3. Constipation. Noted on CT scan. We'll give the patient back surgery.  4. Sarcoidosis. She's  not been on steroids in about a year. Will defer resumption of steroids if needed to Dr. Juanetta Gosling  DVT prophylaxis: lovenox Code Status: full code Family Communication: no family present Disposition Plan: discharge home once improved Consults called:  Admission status: observation, Link Snuffer MD [facility] Pager [phone]  If 7PM-7AM, please contact night-coverage [email or web address] Password [email or web address]  11/19/2016, 3:21 PM

## 2016-11-19 NOTE — Care Management Obs Status (Signed)
MEDICARE OBSERVATION STATUS NOTIFICATION   Patient Details  Name: [name] MRN: [id number] Date of Birth: 09-29-1960   Medicare Observation Status Notification Given:  Yes    Malcolm Metro, RN 11/19/2016, 3:27 PM

## 2016-11-19 NOTE — ED Notes (Signed)
Lab at bedside

## 2016-11-20 DIAGNOSIS — M461 Sacroiliitis, not elsewhere classified: Secondary | ICD-10-CM | POA: Diagnosis present

## 2016-11-20 DIAGNOSIS — Z79899 Other long term (current) drug therapy: Secondary | ICD-10-CM | POA: Diagnosis not present

## 2016-11-20 DIAGNOSIS — J189 Pneumonia, unspecified organism: Secondary | ICD-10-CM | POA: Diagnosis present

## 2016-11-20 DIAGNOSIS — N390 Urinary tract infection, site not specified: Secondary | ICD-10-CM | POA: Diagnosis present

## 2016-11-20 DIAGNOSIS — D869 Sarcoidosis, unspecified: Secondary | ICD-10-CM | POA: Diagnosis not present

## 2016-11-20 DIAGNOSIS — K5900 Constipation, unspecified: Secondary | ICD-10-CM | POA: Diagnosis present

## 2016-11-20 DIAGNOSIS — Z87891 Personal history of nicotine dependence: Secondary | ICD-10-CM | POA: Diagnosis not present

## 2016-11-20 DIAGNOSIS — E7800 Pure hypercholesterolemia, unspecified: Secondary | ICD-10-CM | POA: Diagnosis present

## 2016-11-20 DIAGNOSIS — G894 Chronic pain syndrome: Secondary | ICD-10-CM | POA: Diagnosis not present

## 2016-11-20 DIAGNOSIS — M549 Dorsalgia, unspecified: Secondary | ICD-10-CM | POA: Diagnosis not present

## 2016-11-20 LAB — CBC
HCT: 34.2 % — ABNORMAL LOW (ref 36.0–46.0)
HEMOGLOBIN: 11.2 g/dL — AB (ref 12.0–15.0)
MCH: 27.6 pg (ref 26.0–34.0)
MCHC: 32.7 g/dL (ref 30.0–36.0)
MCV: 84.2 fL (ref 78.0–100.0)
Platelets: 218 10*3/uL (ref 150–400)
RBC: 4.06 MIL/uL (ref 3.87–5.11)
RDW: 15.1 % (ref 11.5–15.5)
WBC: 4.2 10*3/uL (ref 4.0–10.5)

## 2016-11-20 LAB — BASIC METABOLIC PANEL
ANION GAP: 4 — AB (ref 5–15)
BUN: 10 mg/dL (ref 6–20)
CHLORIDE: 104 mmol/L (ref 101–111)
CO2: 30 mmol/L (ref 22–32)
CREATININE: 0.68 mg/dL (ref 0.44–1.00)
Calcium: 8.8 mg/dL — ABNORMAL LOW (ref 8.9–10.3)
GFR calc non Af Amer: >60 mL/min (ref >60)
Glucose, Bld: 97 mg/dL (ref 65–99)
Potassium: 4 mmol/L (ref 3.5–5.1)
Sodium: 138 mmol/L (ref 135–145)

## 2016-11-20 LAB — HIV ANTIBODY (ROUTINE TESTING W REFLEX): HIV Screen 4th Generation wRfx: NONREACTIVE

## 2016-11-20 MED ORDER — METHYLPREDNISOLONE SODIUM SUCC 40 MG IJ SOLR
40.0000 mg | Freq: Two times a day (BID) | INTRAMUSCULAR | Status: DC
  Administered 2016-11-20 – 2016-11-22 (×5): 40 mg via INTRAVENOUS
  Filled 2016-11-20 (×5): qty 1

## 2016-11-20 MED ORDER — GUAIFENESIN ER 600 MG PO TB12
1200.0000 mg | ORAL_TABLET | Freq: Two times a day (BID) | ORAL | Status: DC
  Administered 2016-11-20 – 2016-11-22 (×5): 1200 mg via ORAL
  Filled 2016-11-20 (×5): qty 2

## 2016-11-20 NOTE — Progress Notes (Signed)
Subjective: She was admitted with presumed pneumonia. I have reviewed her chest x-ray and although I agree there is no convincing new infiltrate she does clinically have pneumonia. She says she still coughing. Her cough is mostly nonproductive. She has no other new complaints. She has diffuse pain. This is been an ongoing problem for many years. Her CT of the cervical spine does show increased problems that could be causing radiculopathy.  Objective: Vital signs in last 24 hours: Temp:  [97.7 F (36.5 C)-98.1 F (36.7 C)] 97.8 F (36.6 C) (04/11 0428) Pulse Rate:  [70-74] 72 (04/11 0428) Resp:  [16-20] 18 (04/11 0428) BP: (91-149)/(42-98) 123/55 (04/11 0428) SpO2:  [90 %-99 %] 99 % (04/11 0428) Weight:  [90.7 kg (200 lb)] 90.7 kg (200 lb) (04/10 1251) Weight change:     Intake/Output from previous day: 04/10 0701 - 04/11 0700 In: 942.5 [I.V.:892.5; IV Piggyback:50] Out: 400 [Urine:400]  PHYSICAL EXAM General appearance: alert, cooperative and mild distress Resp: Bilateral crackles Cardio: regular rate and rhythm, S1, S2 normal, no murmur, click, rub or gallop GI: soft, non-tender; bowel sounds normal; no masses,  no organomegaly Extremities: extremities normal, atraumatic, no cyanosis or edema Skin warm and dry. Mucous membranes are moist  Lab Results:  Results for orders placed or performed during the hospital encounter of 11/19/16 (from the past 48 hour(s))  Lipase, blood     Status: None   Collection Time: 11/19/16  7:47 AM  Result Value Ref Range   Lipase 16 11 - 51 U/L  Comprehensive metabolic panel     Status: Abnormal   Collection Time: 11/19/16  7:47 AM  Result Value Ref Range   Sodium 138 135 - 145 mmol/L   Potassium 3.8 3.5 - 5.1 mmol/L   Chloride 104 101 - 111 mmol/L   CO2 28 22 - 32 mmol/L   Glucose, Bld 95 65 - 99 mg/dL   BUN 11 6 - 20 mg/dL   Creatinine, Ser 0.74 0.44 - 1.00 mg/dL   Calcium 9.5 8.9 - 10.3 mg/dL   Total Protein 9.0 (H) 6.5 - 8.1 g/dL    Albumin 4.2 3.5 - 5.0 g/dL   AST 20 15 - 41 U/L   ALT 16 14 - 54 U/L   Alkaline Phosphatase 68 38 - 126 U/L   Total Bilirubin 0.6 0.3 - 1.2 mg/dL   GFR calc non Af Amer >60 >60 mL/min   GFR calc Af Amer >60 >60 mL/min    Comment: (NOTE) The eGFR has been calculated using the CKD EPI equation. This calculation has not been validated in all clinical situations. eGFR's persistently <60 mL/min signify possible Chronic Kidney Disease.    Anion gap 6 5 - 15  CBC     Status: None   Collection Time: 11/19/16  7:47 AM  Result Value Ref Range   WBC 4.4 4.0 - 10.5 K/uL   RBC 4.56 3.87 - 5.11 MIL/uL   Hemoglobin 12.6 12.0 - 15.0 g/dL   HCT 38.0 36.0 - 46.0 %   MCV 83.3 78.0 - 100.0 fL   MCH 27.6 26.0 - 34.0 pg   MCHC 33.2 30.0 - 36.0 g/dL   RDW 15.0 11.5 - 15.5 %   Platelets 239 150 - 400 K/uL  HIV antibody (Routine Testing)     Status: None   Collection Time: 11/19/16  7:47 AM  Result Value Ref Range   HIV Screen 4th Generation wRfx Non Reactive Non Reactive    Comment: (NOTE) Performed  At: [facility] [place], [place] [id number] Lindon Romp MD UX:[id number]   Urinalysis, Routine w reflex microscopic     Status: Abnormal   Collection Time: 11/19/16  8:20 AM  Result Value Ref Range   Color, Urine YELLOW YELLOW   APPearance HAZY (A) CLEAR   Specific Gravity, Urine 1.012 1.005 - 1.030   pH 5.0 5.0 - 8.0   Glucose, UA NEGATIVE NEGATIVE mg/dL   Hgb urine dipstick SMALL (A) NEGATIVE   Bilirubin Urine NEGATIVE NEGATIVE   Ketones, ur NEGATIVE NEGATIVE mg/dL   Protein, ur NEGATIVE NEGATIVE mg/dL   Nitrite POSITIVE (A) NEGATIVE   Leukocytes, UA MODERATE (A) NEGATIVE   RBC / HPF 0-5 0 - 5 RBC/hpf   WBC, UA 6-30 0 - 5 WBC/hpf   Bacteria, UA MANY (A) NONE SEEN   Squamous Epithelial / LPF 0-5 (A) NONE SEEN  Culture, blood (routine x 2)     Status: None (Preliminary result)   Collection Time: 11/19/16 11:04 AM  Result Value Ref Range   Specimen Description  LEFT ANTECUBITAL    Special Requests      BOTTLES DRAWN AEROBIC AND ANAEROBIC Blood Culture adequate volume   Culture PENDING    Report Status PENDING   Culture, blood (routine x 2)     Status: None (Preliminary result)   Collection Time: 11/19/16 11:13 AM  Result Value Ref Range   Specimen Description BLOOD RIGHT HAND    Special Requests      BOTTLES DRAWN AEROBIC ONLY Blood Culture adequate volume   Culture PENDING    Report Status PENDING   Basic metabolic panel     Status: Abnormal   Collection Time: 11/20/16  5:14 AM  Result Value Ref Range   Sodium 138 135 - 145 mmol/L   Potassium 4.0 3.5 - 5.1 mmol/L   Chloride 104 101 - 111 mmol/L   CO2 30 22 - 32 mmol/L   Glucose, Bld 97 65 - 99 mg/dL   BUN 10 6 - 20 mg/dL   Creatinine, Ser 0.68 0.44 - 1.00 mg/dL   Calcium 8.8 (L) 8.9 - 10.3 mg/dL   GFR calc non Af Amer >60 >60 mL/min   GFR calc Af Amer >60 >60 mL/min    Comment: (NOTE) The eGFR has been calculated using the CKD EPI equation. This calculation has not been validated in all clinical situations. eGFR's persistently <60 mL/min signify possible Chronic Kidney Disease.    Anion gap 4 (L) 5 - 15  CBC     Status: Abnormal   Collection Time: 11/20/16  5:14 AM  Result Value Ref Range   WBC 4.2 4.0 - 10.5 K/uL   RBC 4.06 3.87 - 5.11 MIL/uL   Hemoglobin 11.2 (L) 12.0 - 15.0 g/dL   HCT 34.2 (L) 36.0 - 46.0 %   MCV 84.2 78.0 - 100.0 fL   MCH 27.6 26.0 - 34.0 pg   MCHC 32.7 30.0 - 36.0 g/dL   RDW 15.1 11.5 - 15.5 %   Platelets 218 150 - 400 K/uL    ABGS No results for input(s): PHART, PO2ART, TCO2, HCO3 in the last 72 hours.  Invalid input(s): PCO2 CULTURES Recent Results (from the past 240 hour(s))  Culture, blood (routine x 2)     Status: None (Preliminary result)   Collection Time: 11/19/16 11:04 AM  Result Value Ref Range Status   Specimen Description LEFT ANTECUBITAL  Final   Special Requests   Final  BOTTLES DRAWN AEROBIC AND ANAEROBIC Blood Culture  adequate volume   Culture PENDING  Incomplete   Report Status PENDING  Incomplete  Culture, blood (routine x 2)     Status: None (Preliminary result)   Collection Time: 11/19/16 11:13 AM  Result Value Ref Range Status   Specimen Description BLOOD RIGHT HAND  Final   Special Requests   Final    BOTTLES DRAWN AEROBIC ONLY Blood Culture adequate volume   Culture PENDING  Incomplete   Report Status PENDING  Incomplete   Studies/Results: Dg Chest 2 View  Result Date: 11/19/2016 CLINICAL DATA:  Pulmonary sarcoidosis.  Chest pain. EXAM: CHEST  2 VIEW COMPARISON:  07/20/2015 chest radiograph. FINDINGS: Stable cardiomediastinal silhouette with top-normal heart size. No pneumothorax. No pleural effusion. Irregular patchy opacities in the mid to lower lungs bilaterally, not convincingly changed in the interval since 07/20/2015. No acute consolidative airspace disease. Cholecystectomy clips are seen in the right upper quadrant of the abdomen. IMPRESSION: No acute cardiopulmonary disease. Chronic irregular patchy opacities in the mid to lower lungs bilaterally, not convincingly changed since 07/20/2015, presumably representing chronic sequela of pulmonary sarcoidosis or postinfectious scarring. Electronically Signed   By: Ilona Sorrel M.D.   On: 11/19/2016 10:01   Mr Lumbar Spine Wo Contrast  Result Date: 11/19/2016 CLINICAL DATA:  Low back and left hip pain for 3 days. No known injury. EXAM: MRI LUMBAR SPINE WITHOUT CONTRAST TECHNIQUE: Multiplanar, multisequence MR imaging of the lumbar spine was performed. No intravenous contrast was administered. COMPARISON:  MRI lumbar spine 05/14/2010. CT abdomen and pelvis earlier today. FINDINGS: Segmentation:  Standard. Alignment:  Maintained. Vertebrae: No fracture or worrisome lesion. Tiny hemangioma in L2 is noted. Conus medullaris: Extends to the L1 level and appears normal. Paraspinal and other soft tissues: Negative. Disc levels: T11-12 and T12-L1 are imaged in  the sagittal plane only and negative. L1-2:  Negative. L2-3:  Negative. L3-4: Facet degenerative change has progressed with new bilateral facet joint effusions seen. The disc is now desiccated with a mild bulge. The central canal is open. Mild to moderate foraminal narrowing is seen and slightly increased compared the prior examination. L4-5: The patient has a disc bulge with a new superimposed left paracentral protrusion. The protrusion results in narrowing in the left subarticular recess which could irritate the descending left L5 root. Minimal narrowing in the right subarticular recess is also seen and unchanged. Mild bilateral foraminal narrowing is unchanged. L5-S1: Mild facet degenerative disease identified. No disc bulge or protrusion. The central canal and foramina are open. IMPRESSION: Progressive degenerative disease at L3-4 where there are new bilateral facet joint effusions and a shallow disc bulge. The central canal remains open but mild to moderate bilateral foraminal narrowing has slightly worsened since the prior MRI. Progressive spondylosis at L4-5 since the prior MRI where there is a new shallow left paracentral protrusion superimposed on a small disc bulge. Left greater than right subarticular recess narrowing is present which could irritate either descending L5 root. Electronically Signed   By: Inge Rise M.D.   On: 11/19/2016 17:35   Mr Hip Left Wo Contrast  Result Date: 11/19/2016 CLINICAL DATA:  Left flank and hip pain for 3 days. No known injury. Abnormal appearance of the left hip on CT scan today. EXAM: MR OF THE LEFT HIP WITHOUT CONTRAST TECHNIQUE: Multiplanar, multisequence MR imaging was performed. No intravenous contrast was administered. COMPARISON:  CT scan of the abdomen and pelvis 11/19/2016. Whole-body bone scan 11/09/2009. FINDINGS: Bones: There  is no fracture or other acute abnormality. No abnormal signal is seen on T2 weighted or STIR imaging. Marrow signal is mildly  heterogeneous on T1 weighted imaging as can be seen in obesity or smoking. A subtle focus of linear T1 hypointensity is seen in the periphery of the left femoral head correlating with area of most notable sclerosis seen on the prior CT. Linear T1 hypointensity also is identified in the left femoral neck. Articular cartilage and labrum Articular cartilage:  Appears preserved. Labrum:  Intact. Joint or bursal effusion Joint effusion:  None. Bursae:  Negative. Muscles and tendons Muscles and tendons:  Intact. Other findings Miscellaneous: Imaged intrapelvic contents demonstrate no acute abnormality. The patient is status post hysterectomy. IMPRESSION: Small focus of linear T1 hypointensity in the left hip is could represent an atypical appearance of a small focus of avascular necrosis or a small and remote insufficiency fracture. Additional sclerosis in the femoral neck is most compatible with buttressing from stress change. No evidence of acute abnormality or arthropathy is seen about the hip. The study is otherwise negative. Electronically Signed   By: Inge Rise M.D.   On: 11/19/2016 17:49   Ct Renal Stone Study  Result Date: 11/19/2016 CLINICAL DATA:  Left flank pain and hip pain for 3 days EXAM: CT ABDOMEN AND PELVIS WITHOUT CONTRAST TECHNIQUE: Multidetector CT imaging of the abdomen and pelvis was performed following the standard protocol without IV contrast. COMPARISON:  12/25/2006 FINDINGS: Lower chest: Lung bases shows bilateral streaky infiltrate lower lobe posteriorly left greater than right highly suspicious for bilateral pneumonia. Additional streaky infiltrate is noted in right middle lobe. Small patchy nodular infiltrate is noted in left base anterolaterally. Followup examination after appropriate treatment is recommended to assure resolution. Hepatobiliary: Unenhanced liver shows no biliary ductal dilatation. The patient is status post cholecystectomy. Pancreas: Unenhanced pancreas without  focal abnormality. Spleen: Unenhanced spleen with normal appearance. Adrenals/Urinary Tract: Unenhanced kidneys are symmetrical in size. No hydronephrosis or hydroureter. A tiny vascular calcification is noted in right renal hilum. No calcified ureteral calculi. No calcified calculi are noted within empty urinary bladder. Stomach/Bowel: There is no small bowel obstruction. No gastric outlet obstruction. Moderate stool noted in cecum and right colon. Moderate stool noted within transverse colon. The terminal ileum is unremarkable. The patient is status post appendectomy. Some stridor moderate stool and some colonic gas noted within descending colon. Few diverticula are noted proximal sigmoid colon. Distal sigmoid colon and rectum is empty collapsed. No distal colitis or diverticulitis. Vascular/Lymphatic: No aortic aneurysm. Atherosclerotic calcifications of abdominal aorta and iliac arteries. No retroperitoneal or mesenteric adenopathy. Reproductive: The patient is status post hysterectomy Other: No ascites or free abdominal air.  No inguinal adenopathy. Musculoskeletal: No destructive bony lesions are noted. Mild degenerative changes bilateral SI joints. Coronal image 56 there are subtle sclerotic changes noted superolateral aspect of the left femoral head. Although may be degenerative in nature avascular necrosis cannot be excluded. Further correlation with MRI could be performed as clinically warranted. IMPRESSION: 1. Bilateral lung lower lobe streaky consolidation with air bronchogram left greater than right. There is additional consolidation with air bronchogram in right middle lobe. Findings highly suspicious for multifocal pneumonia. Follow-up CT scan of the chest in 1 month after appropriate treatment is recommended to assure resolution and exclude malignancy. 2. No nephrolithiasis. No hydronephrosis or hydroureter. No calcified ureteral calculi. 3. Status post cholecystectomy. 4. Moderate stool noted in  right colon and transverse colon. No pericecal inflammation. Status post appendectomy. Few diverticula  are noted proximal sigmoid colon. No distal colitis or diverticulitis. No distal colonic obstruction. 5. No small bowel obstruction. 6. Status post hysterectomy. 7. Degenerative changes bilateral SI joints. Coronal image 56 there are subtle sclerotic changes noted superolateral aspect of the left femoral head. Although may be degenerative in nature avascular necrosis cannot be excluded. Further correlation with MRI could be performed as clinically warranted. Electronically Signed   By: Lahoma Crocker M.D.   On: 11/19/2016 09:21    Medications:  Prior to Admission:  Prescriptions Prior to Admission  Medication Sig Dispense Refill Last Dose  . ALPRAZolam (XANAX) 1 MG tablet Take 1 mg by mouth at bedtime as needed. For sleep   11/18/2016 at Unknown time  . amitriptyline (ELAVIL) 10 MG tablet Take 10 mg by mouth.   Past Week at Unknown time  . atorvastatin (LIPITOR) 40 MG tablet Take 40 mg by mouth daily.   11/18/2016 at Unknown time  . cyclobenzaprine (FLEXERIL) 10 MG tablet Take 1 tablet by mouth 2 (two) times daily.   11/18/2016 at Unknown time  . HYDROcodone-acetaminophen (VICODIN) 5-500 MG per tablet Take 1 tablet by mouth at bedtime as needed. For pain   11/18/2016 at Unknown time  . ibuprofen (ADVIL,MOTRIN) 200 MG tablet Take 400 mg by mouth every 6 (six) hours as needed. For pain   Taking  . traMADol (ULTRAM) 50 MG tablet Take 50 mg by mouth daily as needed. For pain   Not Taking at Unknown time   Scheduled: . amitriptyline  10 mg Oral QHS  . atorvastatin  40 mg Oral q1800  . cyclobenzaprine  10 mg Oral BID  . enoxaparin (LOVENOX) injection  40 mg Subcutaneous Q24H  . guaiFENesin  1,200 mg Oral BID  . levofloxacin  750 mg Oral Daily  . methylPREDNISolone (SOLU-MEDROL) injection  40 mg Intravenous Q12H   Continuous: . sodium chloride 75 mL/hr at 11/19/16 1606   GBM:[id number] **OR**  acetaminophen, ALPRAZolam, HYDROcodone-acetaminophen, HYDROmorphone (DILAUDID) injection, ondansetron **OR** ondansetron (ZOFRAN) IV  Assesment: She has community-acquired pneumonia. This is complicated by chronic pulmonary sarcoidosis. She also has chronic back pain. She has constipation likely related to pain medications. She feels a little better. Active Problems:   SARCOIDOSIS, PULMONARY   GASTROESOPHAGEAL REFLUX DISEASE   Constipation   Pneumonia   Back pain    Plan: Continue treatments. Add steroids Mucinex and flutter valve.    LOS: 0 days   Daquisha Clermont L 11/20/2016, 8:24 AM

## 2016-11-20 NOTE — Evaluation (Signed)
Physical Therapy Evaluation Patient Details Name: [name] MRN: [id number] DOB: 02-10-1961 Today's Date: 11/20/2016   History of Present Illness  56 y.o. female with medical history significant of sarcoidosis who has been chronically treated with prednisone, but has been off steroids for approximately 1 year now. She reports that for about 3 days now, she has worsening lower back pain which is mostly on the left side, radiates around her abdomen as well as down her left leg. Describes the pain as dull and achy with periods of sharp pain with radiation that occur mostly with movement. She denies any fever. She's not had any change in cough. No shortness of breath. No dysuria. No nausea or vomiting. She reports her bowel movements have been normal. She did not describe any falls, trauma. She has not done any heavy lifting. She reports that the pain occurred while she had gotten up to use a bathroom during the night and has progressively gotten worse over the last 3 days.  MRI of the lumbar spine: Progressive degenerative disease at L3-4 where there are new bilateral facet joint effusions and a shallow disc bulge. The central canal remains open but mild to moderate bilateral foraminal narrowing has slightly worsened since the prior MRI.  Progressive spondylosis at L4-5 since the prior MRI where there is a new shallow left paracentral protrusion superimposed on a small disc bulge. Left greater than right subarticular recess narrowing is present which could irritate either descending L5 root., and MRI of the L hip: Small focus of linear T1 hypointensity in the left hip is could represent an atypical appearance of a small focus of avascular necrosis or a small and remote insufficiency fracture. Additional sclerosis in the femoral neck is most compatible with buttressing  Clinical Impression  Pt received in bed, and was agreeable to PT evaluation.  Pt expressed that she is independent with all functional  mobility and ambulation at baseline.  She enjoys going to the gym and states that it improves her pain most of the time.  She initially rates her pain at an 11/10, however her physical mobility does not demonstrate pain at this scale.  She was able to ambulate independently x 273f.  Pt has increased pain radiating down L LE during ambulation.  Upon return to the room pt was able to get into prone on the bed, and held that position x 2 minutes with pt stating improvement in pain. She was then able to roll back into supine and perform double knee to chest with no change in pain.  She returned to prone and performed prone press ups x 10 reps and expressed that her pain was greatly reduced.  Pt was then able to ambulate from the bed<>chair to sit up for lunch.  She is recommended to follow up with OPPT upon d/c for further education on reduction in back pain.    Follow Up Recommendations Outpatient PT    Equipment Recommendations  None recommended by PT    Recommendations for Other Services       Precautions / Restrictions Precautions Precautions: None Precaution Comments: none Restrictions Weight Bearing Restrictions: No      Mobility  Bed Mobility Overal bed mobility: Independent             General bed mobility comments: educated on using log roll technique.   Transfers Overall transfer level: Independent Equipment used: None                Ambulation/Gait Ambulation/Gait assistance:  Independent Ambulation Distance (Feet): 200 Feet Assistive device: None       General Gait Details: Pt reports increased pain with weight bearing and ambulation.   Stairs            Wheelchair Mobility    Modified Rankin (Stroke Patients Only)       Balance Overall balance assessment: Independent                                           Pertinent Vitals/Pain Pain Assessment: 0-10 Pain Score: 10-Worst pain ever Pain Location: Lower stomach and  back.   pt states she is an 11/10  Pt states the pain radiates down the L LE.  At end of PT evaluation.  Pt reported that her pain was greatly improved, and she always feels better after going to the gym.   Pain Descriptors / Indicators: Aching Pain Intervention(s): Limited activity within patient's tolerance;Monitored during session;Repositioned    Home Living   Living Arrangements: Children   Type of Home: House Home Access: Stairs to enter   [employer] of Steps: 2 Home Layout: One level Home Equipment: None      Prior Function Level of Independence: Independent         Comments: on disability, drive, and is independent with unlimited community ambulation.  Pt enjoys going to the gym and doing power classes, and aerobics     Hand Dominance   Dominant Hand: Right    Extremity/Trunk Assessment   Upper Extremity Assessment Upper Extremity Assessment: Overall WFL for tasks assessed    Lower Extremity Assessment Lower Extremity Assessment: Overall WFL for tasks assessed       Communication   Communication: No difficulties  Cognition Arousal/Alertness: Awake/alert Behavior During Therapy: WFL for tasks assessed/performed Overall Cognitive Status: Within Functional Limits for tasks assessed                                        General Comments      Exercises Other Exercises Other Exercises: prone hold on forearms x 2 minutes with improvement of pain symptoms.   Other Exercises: B knees to chest with no change in pain. Other Exercises: Prone press ups x 10 reps with continued improvement in pain.  Pt encouraged to perform this at home.    Assessment/Plan    PT Assessment All further PT needs can be met in the next venue of care  PT Problem List Pain;Obesity       PT Treatment Interventions      PT Goals (Current goals can be found in the Care Plan section)  Acute Rehab PT Goals Patient Stated Goal: Reduce pain PT Goal  Formulation: All assessment and education complete, DC therapy    Frequency     Barriers to discharge        Co-evaluation               End of Session Equipment Utilized During Treatment: Gait belt Activity Tolerance: Patient tolerated treatment well Patient left: in chair;with family/visitor present Nurse Communication: Mobility status (mobility sheet left hanging in the room. ) PT Visit Diagnosis: Pain Pain - Right/Left: Left (right and left) Pain - part of body: Leg (back)    Time: 4496-7591 PT Time Calculation (min) (ACUTE ONLY): 27  min   Charges:   PT Evaluation $PT Eval Low Complexity: 1 Procedure PT Treatments $Therapeutic Exercise: 8-22 mins   PT G Codes:   PT G-Codes **NOT FOR INPATIENT CLASS** Functional Assessment Tool Used: AM-PAC 6 Clicks Basic Mobility;Clinical judgement Functional Limitation: Mobility: Walking and moving around;Other PT primary Mobility: Walking and Moving Around Current Status [phone]): At least 1 percent but less than 20 percent impaired, limited or restricted Mobility: Walking and Moving Around Goal Status [phone]): At least 1 percent but less than 20 percent impaired, limited or restricted Mobility: Walking and Moving Around Discharge Status [phone]): At least 1 percent but less than 20 percent impaired, limited or restricted    Beth Shareena Nusz, PT, DPT X: [phone]

## 2016-11-20 NOTE — Progress Notes (Signed)
**Note De-Identified [name] Obfuscation** Patient instructed on the flutter valve;tolerated well

## 2016-11-21 MED ORDER — DOCUSATE SODIUM 100 MG PO CAPS
100.0000 mg | ORAL_CAPSULE | Freq: Two times a day (BID) | ORAL | Status: DC
  Administered 2016-11-21 – 2016-11-22 (×3): 100 mg via ORAL
  Filled 2016-11-21 (×3): qty 1

## 2016-11-21 NOTE — Progress Notes (Signed)
Patient requesting "something gentle to help go to the bathroom."  Dr. Juanetta Gosling notified and gave order for Colace 100 mg twice a day.

## 2016-11-21 NOTE — Progress Notes (Signed)
Subjective: She feels substantially better. She has no new complaints. She still has pain in her legs and it has been recommended that she have outpatient physical therapy. Her breathing is doing okay. She is not having productive cough. No chest pain. No other new symptoms.  Objective: Vital signs in last 24 hours: Temp:  [98.4 F (36.9 C)-98.7 F (37.1 C)] 98.7 F (37.1 C) (04/12 0500) Pulse Rate:  [75-82] 75 (04/12 0500) Resp:  [15-18] 15 (04/12 0500) BP: (163-191)/(71-92) 168/74 (04/12 0500) SpO2:  [94 %-96 %] 96 % (04/12 0500) Weight change:  Last BM Date: 11/18/16  Intake/Output from previous day: 04/11 0701 - 04/12 0700 In: 1503.8 [P.O.:720; I.V.:783.8] Out: 1800 [Urine:1800]  PHYSICAL EXAM General appearance: alert, cooperative and mild distress Resp: rhonchi bilaterally Cardio: regular rate and rhythm, S1, S2 normal, no murmur, click, rub or gallop GI: soft, non-tender; bowel sounds normal; no masses,  no organomegaly Extremities: extremities normal, atraumatic, no cyanosis or edema Skin warm and dry.  Lab Results:  Results for orders placed or performed during the hospital encounter of 11/19/16 (from the past 48 hour(s))  Culture, blood (routine x 2)     Status: None (Preliminary result)   Collection Time: 11/19/16 11:04 AM  Result Value Ref Range   Specimen Description LEFT ANTECUBITAL    Special Requests      BOTTLES DRAWN AEROBIC AND ANAEROBIC Blood Culture adequate volume   Culture NO GROWTH 1 DAY    Report Status PENDING   Culture, blood (routine x 2)     Status: None (Preliminary result)   Collection Time: 11/19/16 11:13 AM  Result Value Ref Range   Specimen Description BLOOD RIGHT HAND    Special Requests      BOTTLES DRAWN AEROBIC ONLY Blood Culture adequate volume   Culture NO GROWTH 1 DAY    Report Status PENDING   Basic metabolic panel     Status: Abnormal   Collection Time: 11/20/16  5:14 AM  Result Value Ref Range   Sodium 138 135 - 145 mmol/L    Potassium 4.0 3.5 - 5.1 mmol/L   Chloride 104 101 - 111 mmol/L   CO2 30 22 - 32 mmol/L   Glucose, Bld 97 65 - 99 mg/dL   BUN 10 6 - 20 mg/dL   Creatinine, Ser 0.68 0.44 - 1.00 mg/dL   Calcium 8.8 (L) 8.9 - 10.3 mg/dL   GFR calc non Af Amer >60 >60 mL/min   GFR calc Af Amer >60 >60 mL/min    Comment: (NOTE) The eGFR has been calculated using the CKD EPI equation. This calculation has not been validated in all clinical situations. eGFR's persistently <60 mL/min signify possible Chronic Kidney Disease.    Anion gap 4 (L) 5 - 15  CBC     Status: Abnormal   Collection Time: 11/20/16  5:14 AM  Result Value Ref Range   WBC 4.2 4.0 - 10.5 K/uL   RBC 4.06 3.87 - 5.11 MIL/uL   Hemoglobin 11.2 (L) 12.0 - 15.0 g/dL   HCT 34.2 (L) 36.0 - 46.0 %   MCV 84.2 78.0 - 100.0 fL   MCH 27.6 26.0 - 34.0 pg   MCHC 32.7 30.0 - 36.0 g/dL   RDW 15.1 11.5 - 15.5 %   Platelets 218 150 - 400 K/uL    ABGS No results for input(s): PHART, PO2ART, TCO2, HCO3 in the last 72 hours.  Invalid input(s): PCO2 CULTURES Recent Results (from the past 240 hour(s))  Urine culture     Status: Abnormal (Preliminary result)   Collection Time: 11/19/16  8:48 AM  Result Value Ref Range Status   Specimen Description URINE, CLEAN CATCH  Final   Special Requests NONE  Final   Culture (A)  Final    >=100,000 COLONIES/mL ESCHERICHIA COLI repeating susceptibilities Performed at [facility], [address] [place], [place] [address]    Report Status PENDING  Incomplete  Culture, blood (routine x 2)     Status: None (Preliminary result)   Collection Time: 11/19/16 11:04 AM  Result Value Ref Range Status   Specimen Description LEFT ANTECUBITAL  Final   Special Requests   Final    BOTTLES DRAWN AEROBIC AND ANAEROBIC Blood Culture adequate volume   Culture NO GROWTH 1 DAY  Final   Report Status PENDING  Incomplete  Culture, blood (routine x 2)     Status: None (Preliminary result)   Collection Time: 11/19/16  11:13 AM  Result Value Ref Range Status   Specimen Description BLOOD RIGHT HAND  Final   Special Requests   Final    BOTTLES DRAWN AEROBIC ONLY Blood Culture adequate volume   Culture NO GROWTH 1 DAY  Final   Report Status PENDING  Incomplete   Studies/Results: Dg Chest 2 View  Result Date: 11/19/2016 CLINICAL DATA:  Pulmonary sarcoidosis.  Chest pain. EXAM: CHEST  2 VIEW COMPARISON:  07/20/2015 chest radiograph. FINDINGS: Stable cardiomediastinal silhouette with top-normal heart size. No pneumothorax. No pleural effusion. Irregular patchy opacities in the mid to lower lungs bilaterally, not convincingly changed in the interval since 07/20/2015. No acute consolidative airspace disease. Cholecystectomy clips are seen in the right upper quadrant of the abdomen. IMPRESSION: No acute cardiopulmonary disease. Chronic irregular patchy opacities in the mid to lower lungs bilaterally, not convincingly changed since 07/20/2015, presumably representing chronic sequela of pulmonary sarcoidosis or postinfectious scarring. Electronically Signed   By: Delbert Phenix M.D.   On: 11/19/2016 10:01   Mr Lumbar Spine Wo Contrast  Result Date: 11/19/2016 CLINICAL DATA:  Low back and left hip pain for 3 days. No known injury. EXAM: MRI LUMBAR SPINE WITHOUT CONTRAST TECHNIQUE: Multiplanar, multisequence MR imaging of the lumbar spine was performed. No intravenous contrast was administered. COMPARISON:  MRI lumbar spine 05/14/2010. CT abdomen and pelvis earlier today. FINDINGS: Segmentation:  Standard. Alignment:  Maintained. Vertebrae: No fracture or worrisome lesion. Tiny hemangioma in L2 is noted. Conus medullaris: Extends to the L1 level and appears normal. Paraspinal and other soft tissues: Negative. Disc levels: T11-12 and T12-L1 are imaged in the sagittal plane only and negative. L1-2:  Negative. L2-3:  Negative. L3-4: Facet degenerative change has progressed with new bilateral facet joint effusions seen. The disc is  now desiccated with a mild bulge. The central canal is open. Mild to moderate foraminal narrowing is seen and slightly increased compared the prior examination. L4-5: The patient has a disc bulge with a new superimposed left paracentral protrusion. The protrusion results in narrowing in the left subarticular recess which could irritate the descending left L5 root. Minimal narrowing in the right subarticular recess is also seen and unchanged. Mild bilateral foraminal narrowing is unchanged. L5-S1: Mild facet degenerative disease identified. No disc bulge or protrusion. The central canal and foramina are open. IMPRESSION: Progressive degenerative disease at L3-4 where there are new bilateral facet joint effusions and a shallow disc bulge. The central canal remains open but mild to moderate bilateral foraminal narrowing has slightly worsened since the  prior MRI. Progressive spondylosis at L4-5 since the prior MRI where there is a new shallow left paracentral protrusion superimposed on a small disc bulge. Left greater than right subarticular recess narrowing is present which could irritate either descending L5 root. Electronically Signed   By: Inge Rise M.D.   On: 11/19/2016 17:35   Mr Hip Left Wo Contrast  Result Date: 11/19/2016 CLINICAL DATA:  Left flank and hip pain for 3 days. No known injury. Abnormal appearance of the left hip on CT scan today. EXAM: MR OF THE LEFT HIP WITHOUT CONTRAST TECHNIQUE: Multiplanar, multisequence MR imaging was performed. No intravenous contrast was administered. COMPARISON:  CT scan of the abdomen and pelvis 11/19/2016. Whole-body bone scan 11/09/2009. FINDINGS: Bones: There is no fracture or other acute abnormality. No abnormal signal is seen on T2 weighted or STIR imaging. Marrow signal is mildly heterogeneous on T1 weighted imaging as can be seen in obesity or smoking. A subtle focus of linear T1 hypointensity is seen in the periphery of the left femoral head correlating  with area of most notable sclerosis seen on the prior CT. Linear T1 hypointensity also is identified in the left femoral neck. Articular cartilage and labrum Articular cartilage:  Appears preserved. Labrum:  Intact. Joint or bursal effusion Joint effusion:  None. Bursae:  Negative. Muscles and tendons Muscles and tendons:  Intact. Other findings Miscellaneous: Imaged intrapelvic contents demonstrate no acute abnormality. The patient is status post hysterectomy. IMPRESSION: Small focus of linear T1 hypointensity in the left hip is could represent an atypical appearance of a small focus of avascular necrosis or a small and remote insufficiency fracture. Additional sclerosis in the femoral neck is most compatible with buttressing from stress change. No evidence of acute abnormality or arthropathy is seen about the hip. The study is otherwise negative. Electronically Signed   By: Inge Rise M.D.   On: 11/19/2016 17:49   Ct Renal Stone Study  Result Date: 11/19/2016 CLINICAL DATA:  Left flank pain and hip pain for 3 days EXAM: CT ABDOMEN AND PELVIS WITHOUT CONTRAST TECHNIQUE: Multidetector CT imaging of the abdomen and pelvis was performed following the standard protocol without IV contrast. COMPARISON:  12/25/2006 FINDINGS: Lower chest: Lung bases shows bilateral streaky infiltrate lower lobe posteriorly left greater than right highly suspicious for bilateral pneumonia. Additional streaky infiltrate is noted in right middle lobe. Small patchy nodular infiltrate is noted in left base anterolaterally. Followup examination after appropriate treatment is recommended to assure resolution. Hepatobiliary: Unenhanced liver shows no biliary ductal dilatation. The patient is status post cholecystectomy. Pancreas: Unenhanced pancreas without focal abnormality. Spleen: Unenhanced spleen with normal appearance. Adrenals/Urinary Tract: Unenhanced kidneys are symmetrical in size. No hydronephrosis or hydroureter. A tiny  vascular calcification is noted in right renal hilum. No calcified ureteral calculi. No calcified calculi are noted within empty urinary bladder. Stomach/Bowel: There is no small bowel obstruction. No gastric outlet obstruction. Moderate stool noted in cecum and right colon. Moderate stool noted within transverse colon. The terminal ileum is unremarkable. The patient is status post appendectomy. Some stridor moderate stool and some colonic gas noted within descending colon. Few diverticula are noted proximal sigmoid colon. Distal sigmoid colon and rectum is empty collapsed. No distal colitis or diverticulitis. Vascular/Lymphatic: No aortic aneurysm. Atherosclerotic calcifications of abdominal aorta and iliac arteries. No retroperitoneal or mesenteric adenopathy. Reproductive: The patient is status post hysterectomy Other: No ascites or free abdominal air.  No inguinal adenopathy. Musculoskeletal: No destructive bony lesions are noted. Mild degenerative  changes bilateral SI joints. Coronal image 56 there are subtle sclerotic changes noted superolateral aspect of the left femoral head. Although may be degenerative in nature avascular necrosis cannot be excluded. Further correlation with MRI could be performed as clinically warranted. IMPRESSION: 1. Bilateral lung lower lobe streaky consolidation with air bronchogram left greater than right. There is additional consolidation with air bronchogram in right middle lobe. Findings highly suspicious for multifocal pneumonia. Follow-up CT scan of the chest in 1 month after appropriate treatment is recommended to assure resolution and exclude malignancy. 2. No nephrolithiasis. No hydronephrosis or hydroureter. No calcified ureteral calculi. 3. Status post cholecystectomy. 4. Moderate stool noted in right colon and transverse colon. No pericecal inflammation. Status post appendectomy. Few diverticula are noted proximal sigmoid colon. No distal colitis or diverticulitis. No  distal colonic obstruction. 5. No small bowel obstruction. 6. Status post hysterectomy. 7. Degenerative changes bilateral SI joints. Coronal image 56 there are subtle sclerotic changes noted superolateral aspect of the left femoral head. Although may be degenerative in nature avascular necrosis cannot be excluded. Further correlation with MRI could be performed as clinically warranted. Electronically Signed   By: Lahoma Crocker M.D.   On: 11/19/2016 09:21    Medications:  Prior to Admission:  Prescriptions Prior to Admission  Medication Sig Dispense Refill Last Dose  . ALPRAZolam (XANAX) 1 MG tablet Take 1 mg by mouth at bedtime as needed. For sleep   11/18/2016 at Unknown time  . amitriptyline (ELAVIL) 10 MG tablet Take 10 mg by mouth.   Past Week at Unknown time  . atorvastatin (LIPITOR) 40 MG tablet Take 40 mg by mouth daily.   11/18/2016 at Unknown time  . cyclobenzaprine (FLEXERIL) 10 MG tablet Take 1 tablet by mouth 2 (two) times daily.   11/18/2016 at Unknown time  . HYDROcodone-acetaminophen (VICODIN) 5-500 MG per tablet Take 1 tablet by mouth at bedtime as needed. For pain   11/18/2016 at Unknown time  . ibuprofen (ADVIL,MOTRIN) 200 MG tablet Take 400 mg by mouth every 6 (six) hours as needed. For pain   Taking  . traMADol (ULTRAM) 50 MG tablet Take 50 mg by mouth daily as needed. For pain   Not Taking at Unknown time   Scheduled: . amitriptyline  10 mg Oral QHS  . atorvastatin  40 mg Oral q1800  . cyclobenzaprine  10 mg Oral BID  . enoxaparin (LOVENOX) injection  40 mg Subcutaneous Q24H  . guaiFENesin  1,200 mg Oral BID  . levofloxacin  750 mg Oral Daily  . methylPREDNISolone (SOLU-MEDROL) injection  40 mg Intravenous Q12H   Continuous: . sodium chloride 75 mL/hr at 11/21/16 0023   [id number] **OR** acetaminophen, ALPRAZolam, HYDROcodone-acetaminophen, HYDROmorphone (DILAUDID) injection, ondansetron **OR** ondansetron (ZOFRAN) IV  Assesment: She was admitted with what appears to be  pneumonia. She is improving. She has chronic back pain. She has pulmonary sarcoidosis at baseline. She is improving. Active Problems:   SARCOIDOSIS, PULMONARY   GASTROESOPHAGEAL REFLUX DISEASE   Constipation   Pneumonia   Back pain    Plan: Probable discharge tomorrow    LOS: 1 day   Darryl Blumenstein L 11/21/2016, 8:55 AM

## 2016-11-21 NOTE — Care Management Note (Signed)
Case Management Note  Patient Details  Name: [name] MRN: [id number] Date of Birth: 05/21/1961  Subjective/Objective:                  Pt admitted with pneumonia. She is from home, ind with ADL's. She has PCP, transportation and insurance with drug coverage. PT has recommended OP PT and pt agreeable. Referral sent to [facility], per pt choice.   Action/Plan: Anticipate DC home tomorrow.   Expected Discharge Date:     11/22/2016             Expected Discharge Plan:  Home/Self Care  In-House Referral:  NA  Discharge planning Services  CM Consult  Post Acute Care Choice:  NA Choice offered to:  NA  Status of Service:  Completed, signed off  Malcolm Metro, RN 11/21/2016, 11:31 AM

## 2016-11-22 MED ORDER — BISACODYL 10 MG RE SUPP: 10.0000 mg | Freq: Every day | RECTAL | Status: DC | PRN

## 2016-11-22 MED ORDER — PREDNISONE 10 MG (21) PO TBPK: ORAL_TABLET | ORAL | 0 refills | Status: DC

## 2016-11-22 MED ORDER — LEVOFLOXACIN 750 MG PO TABS: 750.0000 mg | ORAL_TABLET | Freq: Every day | ORAL | 0 refills | Status: DC

## 2016-11-22 MED ORDER — BISACODYL 5 MG PO TBEC
5.0000 mg | DELAYED_RELEASE_TABLET | Freq: Once | ORAL | Status: AC
  Administered 2016-11-22: 5 mg via ORAL

## 2016-11-22 NOTE — Care Management (Signed)
Pt on ACO registry. Referral made for enrollment in [facility] calls.

## 2016-11-22 NOTE — Care Management Important Message (Signed)
Important Message  Patient Details  Name: Tavi Gaughran MRN: [id number] Date of Birth: April 09, 1961   Medicare Important Message Given:  Yes    Malcolm Metro, RN 11/22/2016, 1:28 PM

## 2016-11-22 NOTE — Progress Notes (Signed)
Subjective: She says she feels better. She's having some abdominal discomfort and that seems to be related to not having a bowel movement. Her breathing is much better.  Objective: Vital signs in last 24 hours: Temp:  [98.3 F (36.8 C)-98.5 F (36.9 C)] 98.4 F (36.9 C) (04/13 0703) Pulse Rate:  [74-76] 76 (04/13 0703) Resp:  [16] 16 (04/13 0703) BP: (134-187)/(68-83) 134/68 (04/13 0703) SpO2:  [92 %-98 %] 96 % (04/13 0703) Weight change:  Last BM Date: 11/18/16  Intake/Output from previous day: 04/12 0701 - 04/13 0700 In: 1980 [P.O.:1080; I.V.:900] Out: 2400 [Urine:2400]  PHYSICAL EXAM General appearance: alert, cooperative and no distress Resp: clear to auscultation bilaterally Cardio: regular rate and rhythm, S1, S2 normal, no murmur, click, rub or gallop GI: Abdomen soft but minimally tender Extremities: extremities normal, atraumatic, no cyanosis or edema Skin warm and dry  Lab Results:  No results found for this or any previous visit (from the past 48 hour(s)).  ABGS No results for input(s): PHART, PO2ART, TCO2, HCO3 in the last 72 hours.  Invalid input(s): PCO2 CULTURES Recent Results (from the past 240 hour(s))  Urine culture     Status: Abnormal (Preliminary result)   Collection Time: 11/19/16  8:48 AM  Result Value Ref Range Status   Specimen Description URINE, CLEAN CATCH  Final   Special Requests NONE  Final   Culture (A)  Final    >=100,000 COLONIES/mL ESCHERICHIA COLI repeating susceptibilities Performed at [facility], [address], [place], [place] [address]    Report Status PENDING  Incomplete  Culture, blood (routine x 2)     Status: None (Preliminary result)   Collection Time: 11/19/16 11:04 AM  Result Value Ref Range Status   Specimen Description LEFT ANTECUBITAL  Final   Special Requests   Final    BOTTLES DRAWN AEROBIC AND ANAEROBIC Blood Culture adequate volume   Culture NO GROWTH 2 DAYS  Final   Report Status PENDING   Incomplete  Culture, blood (routine x 2)     Status: None (Preliminary result)   Collection Time: 11/19/16 11:13 AM  Result Value Ref Range Status   Specimen Description BLOOD RIGHT HAND  Final   Special Requests   Final    BOTTLES DRAWN AEROBIC ONLY Blood Culture adequate volume   Culture NO GROWTH 2 DAYS  Final   Report Status PENDING  Incomplete   Studies/Results: No results found.  Medications:  Prior to Admission:  Prescriptions Prior to Admission  Medication Sig Dispense Refill Last Dose  . ALPRAZolam (XANAX) 1 MG tablet Take 1 mg by mouth at bedtime as needed. For sleep   11/18/2016 at Unknown time  . amitriptyline (ELAVIL) 10 MG tablet Take 10 mg by mouth.   Past Week at Unknown time  . atorvastatin (LIPITOR) 40 MG tablet Take 40 mg by mouth daily.   11/18/2016 at Unknown time  . cyclobenzaprine (FLEXERIL) 10 MG tablet Take 1 tablet by mouth 2 (two) times daily.   11/18/2016 at Unknown time  . HYDROcodone-acetaminophen (VICODIN) 5-500 MG per tablet Take 1 tablet by mouth at bedtime as needed. For pain   11/18/2016 at Unknown time  . ibuprofen (ADVIL,MOTRIN) 200 MG tablet Take 400 mg by mouth every 6 (six) hours as needed. For pain   Taking  . traMADol (ULTRAM) 50 MG tablet Take 50 mg by mouth daily as needed. For pain   Not Taking at Unknown time   Scheduled: . amitriptyline  10 mg  Oral QHS  . atorvastatin  40 mg Oral q1800  . bisacodyl  5 mg Oral Once  . cyclobenzaprine  10 mg Oral BID  . docusate sodium  100 mg Oral BID  . enoxaparin (LOVENOX) injection  40 mg Subcutaneous Q24H  . guaiFENesin  1,200 mg Oral BID  . levofloxacin  750 mg Oral Daily  . methylPREDNISolone (SOLU-MEDROL) injection  40 mg Intravenous Q12H   Continuous: . sodium chloride 75 mL/hr at 11/22/16 0210   [id number] **OR** acetaminophen, ALPRAZolam, bisacodyl, HYDROcodone-acetaminophen, HYDROmorphone (DILAUDID) injection, ondansetron **OR** ondansetron (ZOFRAN) IV  Assesment: She was admitted with  pneumonia. She is much improved. At baseline she has sarcoidosis and chronic pain. She has constipation that I think is likely related to chronic pain and pain medications. I have ordered laxatives. Active Problems:   SARCOIDOSIS, PULMONARY   GASTROESOPHAGEAL REFLUX DISEASE   Constipation   Pneumonia   Back pain    Plan: As above. If after a bowel movement she feels better she could be discharged home    LOS: 2 days   Alix Stowers L 11/22/2016, 8:50 AM

## 2016-11-23 LAB — URINE CULTURE: Culture: >=100000 — AB

## 2016-11-24 LAB — CULTURE, BLOOD (ROUTINE X 2)
CULTURE: NO GROWTH
Culture: NO GROWTH
SPECIAL REQUESTS: ADEQUATE
SPECIAL REQUESTS: ADEQUATE

## 2016-11-24 NOTE — Discharge Summary (Signed)
Physician Discharge Summary  Patient ID: [name] MRN: [id number] DOB/AGE: 02/28/1961 56 y.o. Primary Care Physician:Daren Yeagle L, MD Admit date: 11/19/2016 Discharge date: 11/24/2016    Discharge Diagnoses:   Active Problems:   SARCOIDOSIS, PULMONARY   GASTROESOPHAGEAL REFLUX DISEASE   Constipation   Pneumonia   Back pain   Allergies as of 11/22/2016      Reactions   Aspirin       Medication List    TAKE these medications   ALPRAZolam 1 MG tablet Commonly known as:  XANAX Take 1 mg by mouth at bedtime as needed. For sleep   amitriptyline 10 MG tablet Commonly known as:  ELAVIL Take 10 mg by mouth.   atorvastatin 40 MG tablet Commonly known as:  LIPITOR Take 40 mg by mouth daily.   cyclobenzaprine 10 MG tablet Commonly known as:  FLEXERIL Take 1 tablet by mouth 2 (two) times daily.   HYDROcodone-acetaminophen 5-500 MG tablet Commonly known as:  VICODIN Take 1 tablet by mouth at bedtime as needed. For pain   ibuprofen 200 MG tablet Commonly known as:  ADVIL,MOTRIN Take 400 mg by mouth every 6 (six) hours as needed. For pain   levofloxacin 750 MG tablet Commonly known as:  LEVAQUIN Take 1 tablet (750 mg total) by mouth daily.   predniSONE 10 MG (21) Tbpk tablet Commonly known as:  STERAPRED UNI-PAK 21 TAB Take by package instructions   traMADol 50 MG tablet Commonly known as:  ULTRAM Take 50 mg by mouth daily as needed. For pain       Discharged Condition:Improved    Consults: None  Significant Diagnostic Studies: Dg Chest 2 View  Result Date: 11/19/2016 CLINICAL DATA:  Pulmonary sarcoidosis.  Chest pain. EXAM: CHEST  2 VIEW COMPARISON:  07/20/2015 chest radiograph. FINDINGS: Stable cardiomediastinal silhouette with top-normal heart size. No pneumothorax. No pleural effusion. Irregular patchy opacities in the mid to lower lungs bilaterally, not convincingly changed in the interval since 07/20/2015. No acute consolidative airspace  disease. Cholecystectomy clips are seen in the right upper quadrant of the abdomen. IMPRESSION: No acute cardiopulmonary disease. Chronic irregular patchy opacities in the mid to lower lungs bilaterally, not convincingly changed since 07/20/2015, presumably representing chronic sequela of pulmonary sarcoidosis or postinfectious scarring. Electronically Signed   By: Delbert Phenix M.D.   On: 11/19/2016 10:01   Mr Lumbar Spine Wo Contrast  Result Date: 11/19/2016 CLINICAL DATA:  Low back and left hip pain for 3 days. No known injury. EXAM: MRI LUMBAR SPINE WITHOUT CONTRAST TECHNIQUE: Multiplanar, multisequence MR imaging of the lumbar spine was performed. No intravenous contrast was administered. COMPARISON:  MRI lumbar spine 05/14/2010. CT abdomen and pelvis earlier today. FINDINGS: Segmentation:  Standard. Alignment:  Maintained. Vertebrae: No fracture or worrisome lesion. Tiny hemangioma in L2 is noted. Conus medullaris: Extends to the L1 level and appears normal. Paraspinal and other soft tissues: Negative. Disc levels: T11-12 and T12-L1 are imaged in the sagittal plane only and negative. L1-2:  Negative. L2-3:  Negative. L3-4: Facet degenerative change has progressed with new bilateral facet joint effusions seen. The disc is now desiccated with a mild bulge. The central canal is open. Mild to moderate foraminal narrowing is seen and slightly increased compared the prior examination. L4-5: The patient has a disc bulge with a new superimposed left paracentral protrusion. The protrusion results in narrowing in the left subarticular recess which could irritate the descending left L5 root. Minimal narrowing in the right subarticular recess is also seen and  unchanged. Mild bilateral foraminal narrowing is unchanged. L5-S1: Mild facet degenerative disease identified. No disc bulge or protrusion. The central canal and foramina are open. IMPRESSION: Progressive degenerative disease at L3-4 where there are new bilateral  facet joint effusions and a shallow disc bulge. The central canal remains open but mild to moderate bilateral foraminal narrowing has slightly worsened since the prior MRI. Progressive spondylosis at L4-5 since the prior MRI where there is a new shallow left paracentral protrusion superimposed on a small disc bulge. Left greater than right subarticular recess narrowing is present which could irritate either descending L5 root. Electronically Signed   By: Drusilla Kanner M.D.   On: 11/19/2016 17:35   Mr Hip Left Wo Contrast  Result Date: 11/19/2016 CLINICAL DATA:  Left flank and hip pain for 3 days. No known injury. Abnormal appearance of the left hip on CT scan today. EXAM: MR OF THE LEFT HIP WITHOUT CONTRAST TECHNIQUE: Multiplanar, multisequence MR imaging was performed. No intravenous contrast was administered. COMPARISON:  CT scan of the abdomen and pelvis 11/19/2016. Whole-body bone scan 11/09/2009. FINDINGS: Bones: There is no fracture or other acute abnormality. No abnormal signal is seen on T2 weighted or STIR imaging. Marrow signal is mildly heterogeneous on T1 weighted imaging as can be seen in obesity or smoking. A subtle focus of linear T1 hypointensity is seen in the periphery of the left femoral head correlating with area of most notable sclerosis seen on the prior CT. Linear T1 hypointensity also is identified in the left femoral neck. Articular cartilage and labrum Articular cartilage:  Appears preserved. Labrum:  Intact. Joint or bursal effusion Joint effusion:  None. Bursae:  Negative. Muscles and tendons Muscles and tendons:  Intact. Other findings Miscellaneous: Imaged intrapelvic contents demonstrate no acute abnormality. The patient is status post hysterectomy. IMPRESSION: Small focus of linear T1 hypointensity in the left hip is could represent an atypical appearance of a small focus of avascular necrosis or a small and remote insufficiency fracture. Additional sclerosis in the femoral  neck is most compatible with buttressing from stress change. No evidence of acute abnormality or arthropathy is seen about the hip. The study is otherwise negative. Electronically Signed   By: Drusilla Kanner M.D.   On: 11/19/2016 17:49   Ct Renal Stone Study  Result Date: 11/19/2016 CLINICAL DATA:  Left flank pain and hip pain for 3 days EXAM: CT ABDOMEN AND PELVIS WITHOUT CONTRAST TECHNIQUE: Multidetector CT imaging of the abdomen and pelvis was performed following the standard protocol without IV contrast. COMPARISON:  12/25/2006 FINDINGS: Lower chest: Lung bases shows bilateral streaky infiltrate lower lobe posteriorly left greater than right highly suspicious for bilateral pneumonia. Additional streaky infiltrate is noted in right middle lobe. Small patchy nodular infiltrate is noted in left base anterolaterally. Followup examination after appropriate treatment is recommended to assure resolution. Hepatobiliary: Unenhanced liver shows no biliary ductal dilatation. The patient is status post cholecystectomy. Pancreas: Unenhanced pancreas without focal abnormality. Spleen: Unenhanced spleen with normal appearance. Adrenals/Urinary Tract: Unenhanced kidneys are symmetrical in size. No hydronephrosis or hydroureter. A tiny vascular calcification is noted in right renal hilum. No calcified ureteral calculi. No calcified calculi are noted within empty urinary bladder. Stomach/Bowel: There is no small bowel obstruction. No gastric outlet obstruction. Moderate stool noted in cecum and right colon. Moderate stool noted within transverse colon. The terminal ileum is unremarkable. The patient is status post appendectomy. Some stridor moderate stool and some colonic gas noted within descending colon. Few diverticula are  noted proximal sigmoid colon. Distal sigmoid colon and rectum is empty collapsed. No distal colitis or diverticulitis. Vascular/Lymphatic: No aortic aneurysm. Atherosclerotic calcifications of  abdominal aorta and iliac arteries. No retroperitoneal or mesenteric adenopathy. Reproductive: The patient is status post hysterectomy Other: No ascites or free abdominal air.  No inguinal adenopathy. Musculoskeletal: No destructive bony lesions are noted. Mild degenerative changes bilateral SI joints. Coronal image 56 there are subtle sclerotic changes noted superolateral aspect of the left femoral head. Although may be degenerative in nature avascular necrosis cannot be excluded. Further correlation with MRI could be performed as clinically warranted. IMPRESSION: 1. Bilateral lung lower lobe streaky consolidation with air bronchogram left greater than right. There is additional consolidation with air bronchogram in right middle lobe. Findings highly suspicious for multifocal pneumonia. Follow-up CT scan of the chest in 1 month after appropriate treatment is recommended to assure resolution and exclude malignancy. 2. No nephrolithiasis. No hydronephrosis or hydroureter. No calcified ureteral calculi. 3. Status post cholecystectomy. 4. Moderate stool noted in right colon and transverse colon. No pericecal inflammation. Status post appendectomy. Few diverticula are noted proximal sigmoid colon. No distal colitis or diverticulitis. No distal colonic obstruction. 5. No small bowel obstruction. 6. Status post hysterectomy. 7. Degenerative changes bilateral SI joints. Coronal image 56 there are subtle sclerotic changes noted superolateral aspect of the left femoral head. Although may be degenerative in nature avascular necrosis cannot be excluded. Further correlation with MRI could be performed as clinically warranted. Electronically Signed   By: Natasha Mead M.D.   On: 11/19/2016 09:21    Lab Results: Basic Metabolic Panel: No results for input(s): NA, K, CL, CO2, GLUCOSE, BUN, CREATININE, CALCIUM, MG, PHOS in the last 72 hours. Liver Function Tests: No results for input(s): AST, ALT, ALKPHOS, BILITOT, PROT,  ALBUMIN in the last 72 hours.   CBC: No results for input(s): WBC, NEUTROABS, HGB, HCT, MCV, PLT in the last 72 hours.  Recent Results (from the past 240 hour(s))  Urine culture     Status: Abnormal   Collection Time: 11/19/16  8:48 AM  Result Value Ref Range Status   Specimen Description URINE, CLEAN CATCH  Final   Special Requests NONE  Final   Culture >=100,000 COLONIES/mL ESCHERICHIA COLI (A)  Final   Report Status 11/23/2016 FINAL  Final   Organism ID, Bacteria ESCHERICHIA COLI (A)  Final      Susceptibility   Escherichia coli - MIC*    AMPICILLIN 8 SENSITIVE Sensitive     CEFAZOLIN <=4 SENSITIVE Sensitive     CEFTRIAXONE <=1 SENSITIVE Sensitive     CIPROFLOXACIN <=0.25 SENSITIVE Sensitive     GENTAMICIN <=1 SENSITIVE Sensitive     IMIPENEM <=0.25 SENSITIVE Sensitive     NITROFURANTOIN <=16 SENSITIVE Sensitive     TRIMETH/SULFA <=20 SENSITIVE Sensitive     AMPICILLIN/SULBACTAM 4 SENSITIVE Sensitive     PIP/TAZO <=4 SENSITIVE Sensitive     Extended ESBL NEGATIVE Sensitive     * >=100,000 COLONIES/mL ESCHERICHIA COLI  Culture, blood (routine x 2)     Status: None (Preliminary result)   Collection Time: 11/19/16 11:04 AM  Result Value Ref Range Status   Specimen Description LEFT ANTECUBITAL  Final   Special Requests   Final    BOTTLES DRAWN AEROBIC AND ANAEROBIC Blood Culture adequate volume   Culture NO GROWTH 4 DAYS  Final   Report Status PENDING  Incomplete  Culture, blood (routine x 2)     Status: None (Preliminary result)  Collection Time: 11/19/16 11:13 AM  Result Value Ref Range Status   Specimen Description BLOOD RIGHT HAND  Final   Special Requests   Final    BOTTLES DRAWN AEROBIC ONLY Blood Culture adequate volume   Culture NO GROWTH 4 DAYS  Final   Report Status PENDING  Incomplete     Hospital Course: She came to the hospital because of shortness of breath. She was coughing and congested. She was coughing up sputum. She had x-rays which showed what is  probably chronic changes from sarcoidosis but clinically she was felt to have pneumonia. She also had CT of the spine that showed that she has significant abnormalities in her spine and probably produced problems with back pain. She was treated with IV antibiotics and IV steroids and was given pain medications. She improved over the next several days. She had constipation which was treated with laxatives. She had some abdominal pain after a bowel movement this resolved. She was much improved at the time of discharge and ready to go home.  Discharge Exam: Blood pressure 134/68, pulse 76, temperature 98.4 F (36.9 C), temperature source Oral, resp. rate 16, height 5' 2.5" (1.588 m), weight 90.7 kg (200 lb), SpO2 96 %. She is awake and alert. Her chest is clear. Abdomen is soft with no masses.  Disposition: Home she will start physical therapy as an outpatient  Discharge Instructions    Ambulatory referral to Physical Therapy    Complete by:  As directed       Follow-up Information    Kelten Enochs L, MD Follow up in 2 week(s).   Specialty:  Pulmonary Disease Contact information: [address] [place] [address] [phone]           Signed: Ryka Beighley L   11/24/2016, 10:49 AM

## 2016-11-27 DIAGNOSIS — I10 Essential (primary) hypertension: Secondary | ICD-10-CM | POA: Diagnosis not present

## 2016-11-27 DIAGNOSIS — J189 Pneumonia, unspecified organism: Secondary | ICD-10-CM | POA: Diagnosis not present

## 2016-11-27 DIAGNOSIS — M1990 Unspecified osteoarthritis, unspecified site: Secondary | ICD-10-CM | POA: Diagnosis not present

## 2016-11-27 DIAGNOSIS — D869 Sarcoidosis, unspecified: Secondary | ICD-10-CM | POA: Diagnosis not present

## 2016-11-29 NOTE — Discharge Summary (Signed)
NAME:  [name], [name]                  ACCOUNT NO.:  MEDICAL RECORD NO.:  [id number]  LOCATION:                                 FACILITY:  PHYSICIAN:  Sania Noy L. Juanetta Gosling, M.D.DATE OF BIRTH:  [birth year]  DATE OF ADMISSION: DATE OF DISCHARGE:  LH                              DISCHARGE SUMMARY   ADDENDUM: Discharged on the 13th.  DISCHARGE DIAGNOSES: 1. Sacroiliitis. 2. Back pain due to sacroiliitis. 3. Urinary tract infections.     Darrold Bezek L. Juanetta Gosling, M.D.     ELH/MEDQ  D:  11/28/2016  T:  11/29/2016  Job:  [id number]

## 2016-12-02 DIAGNOSIS — N390 Urinary tract infection, site not specified: Secondary | ICD-10-CM | POA: Diagnosis not present

## 2016-12-04 ENCOUNTER — Ambulatory Visit (HOSPITAL_COMMUNITY): Payer: Medicare Other | Attending: Pulmonary Disease

## 2016-12-04 ENCOUNTER — Encounter (HOSPITAL_COMMUNITY): Payer: Self-pay

## 2016-12-04 DIAGNOSIS — R262 Difficulty in walking, not elsewhere classified: Secondary | ICD-10-CM | POA: Diagnosis not present

## 2016-12-04 DIAGNOSIS — M5442 Lumbago with sciatica, left side: Secondary | ICD-10-CM | POA: Insufficient documentation

## 2016-12-04 DIAGNOSIS — M5441 Lumbago with sciatica, right side: Secondary | ICD-10-CM | POA: Insufficient documentation

## 2016-12-04 DIAGNOSIS — G8929 Other chronic pain: Secondary | ICD-10-CM | POA: Diagnosis not present

## 2016-12-04 DIAGNOSIS — M6281 Muscle weakness (generalized): Secondary | ICD-10-CM | POA: Insufficient documentation

## 2016-12-04 NOTE — Therapy (Signed)
[facility] [address] [place], [place], [address] Phone: [phone]   Fax:  [phone]  Physical Therapy Evaluation  Patient Details  Name: [name] MRN: [id number] Date of Birth: 10/09/1960 Referring Provider: Kari Baars, MD  Encounter Date: 12/04/2016      PT End of Session - 12/04/16 1031    Visit Number 1   Number of Visits 12   Date for PT Re-Evaluation 12/25/16   Authorization Type Medicare Part A and B   Authorization Time Period 12/04/16 to 01/15/17   Authorization - Visit Number 1   Authorization - Number of Visits 10   PT Start Time 0940   PT Stop Time 1030   PT Time Calculation (min) 50 min   Activity Tolerance Patient tolerated treatment well   Behavior During Therapy [facility] for tasks assessed/performed      Past Medical History:  Diagnosis Date  . Chest pain   . GERD (gastroesophageal reflux disease)   . Hypercholesterolemia   . Pneumonia   . Pulmonary sarcoidosis (HCC)    Treatment with Prednisone    Past Surgical History:  Procedure Laterality Date  . ABDOMINAL HYSTERECTOMY    . APPENDECTOMY    . CHOLECYSTECTOMY      There were no vitals filed for this visit.       Subjective Assessment - 12/04/16 0951    Subjective Pt states that she has been having some LBP with radicular symptoms. She states her pain is primarily located at her central low back, and her radicular symptoms will start down both legs with moving too much. This pain started about a year ago but it has gotten worse over the last 6 months. She denies n/t, b/b changes, and BLE weakness. She denies any falls or close calls over the last 6 months. She states that if she doesn't have on a back brace or something tight around her back, then she can't move well at all. She states that rest and her back brace are the only relieving factors. She states that cooking is the most difficult thing for her to do because of the pain. 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   Pertinent History pulmonary sarcoidosis, GERD   Limitations Standing;Walking;House hold activities   How long can you sit comfortably? 20-30 mins   How long can you stand comfortably? can do it for an hour but is in pain   How long can you walk comfortably? 1-2 hours but is in pain, can't do concrete or grass walking (probably about 10-20 mins on concrete or grass)   Patient Stated Goals stop pain, ease up the pain   Currently in Pain? Yes   Pain Score --  11/10   Pain Location Back   Pain Orientation Lower;Mid;Left   Pain Descriptors / Indicators Aching   Pain Type Chronic pain   Pain Radiating Towards can radiate into BLE, and radiates between R and L side of lower back   Pain Onset More than a month ago   Pain Frequency Constant   Aggravating Factors  moving around   Pain Relieving Factors back brace and rest   Effect of Pain on Daily Activities slows her down            [facility] PT Assessment - 12/04/16 0001      Assessment   Medical Diagnosis back pain, hip pain, leg pain, sarcoidosis   Referring  Provider Kari Baars, MD   Onset Date/Surgical Date 12/05/15   Next MD Visit 01/27/17   Prior Therapy not for present issue     Precautions   Precautions None     Balance Screen   Has the patient fallen in the past 6 months No   Has the patient had a decrease in activity level because of a fear of falling?  No   Is the patient reluctant to leave their home because of a fear of falling?  No     Prior Function   Level of Independence Independent   Vocation On disability  Foster parent   [employer] very active with her 10,12, and 56 YO foster children   Leisure teaching dance, volleyball, Counsellor   Overall Cognitive Status Within Functional Limits for tasks assessed     Sensation   Light Touch Appears Intact     Posture/Postural Control   Posture/Postural Control No  significant limitations     ROM / Strength   AROM / PROM / Strength AROM;Strength     AROM   Overall AROM Comments min limitations in R hip IR PROM, L hip PROM WNL   Lumbar Flexion fingertips to floor, pain coming back up   Lumbar Extension min limitations   Lumbar - Right Side Bend fingertips to knee joint line   Lumbar - Left Side Bend fingertips to knee joint line   Lumbar - Right Rotation WNL   Lumbar - Left Rotation WNL     Strength   Right Hip Flexion 5/5   Right Hip Extension 3+/5   Right Hip ABduction 4-/5   Left Hip Flexion 5/5   Left Hip Extension 2+/5   Left Hip ABduction 3+/5   Right Knee Flexion 4+/5   Right Knee Extension 5/5   Left Knee Flexion 4+/5   Left Knee Extension 4/5  recreated LBP   Right Ankle Dorsiflexion 5/5   Left Ankle Dorsiflexion 5/5     Flexibility   Soft Tissue Assessment /Muscle Length yes   Hamstrings L tighter than R   Quadriceps +ely's L, recreated back pain, R - ely's decreased LBP   Piriformis tight BLE     Palpation   Palpation comment increased soft tissue restrictions of bil lumbar paraspinals, gluteals, piriformis, quadratus lumborum, with increased tenderness to palpation of L side     Ambulation/Gait   Ambulation Distance (Feet) 560 Feet    Gait Pattern Step-through pattern   Gait Comments decreased hip ext bil, decreased pelvic control, bil trendelenberg (L>R) which worsened as time increased     Balance   Balance Assessed Yes     Static Standing Balance   Static Standing - Balance Support No upper extremity supported   Static Standing Balance -  Activities  Single Leg Stance - Right Leg;Single Leg Stance - Left Leg   Static Standing - Comment/# of Minutes R SLS: 25 sec L SLS: 22 sec     Standardized Balance Assessment   Standardized Balance Assessment Five Times Sit to Stand   Five times sit to stand comments  17.6 seconds from chair with no hands             PT Education - 12/04/16 1131    Education  provided Yes   Education Details exam findings, POC, HEP   Person(s) Educated Patient   Methods Explanation;Demonstration;Handout   Comprehension Verbalized understanding;Returned demonstration  PT Short Term Goals - 12/04/16 1142      PT SHORT TERM GOAL #1   Title Pt will be independent with HEP and perform consistently to promote return to PLOF and decrease pain.   Time 3   Period Weeks   Status New     PT SHORT TERM GOAL #2   Title Pt will have improved 5xSTS to <10 seconds to demonstrate improved overall functional strength to maximize function at home.    Time 3   Period Weeks   Status New     PT SHORT TERM GOAL #3   Title Pt will have improved SLS on BLE to 30 seconds or greater with no UE support to maximize gait and demo improved overall function.   Time 3   Period Weeks   Status New           PT Long Term Goals - 12/04/16 1144      PT LONG TERM GOAL #1   Title Pt will report being able to tolerate cooking for 1 hour or > with no more than 4/10 pain in order to maximize participation at home.   Time 6   Period Weeks   Status New     PT LONG TERM GOAL #2   Title Pt will have improved by 178ft or > with proper gait mechanics and without an increase in pain to demonstrate improved overall function and maximize participation in the community.   Time 6   Period Weeks   Status New     PT LONG TERM GOAL #3   Title Pt will have improved overall BLE strength to 5/5 to maximize gait and functional movements in order to decrease pain and promote return to PLOF.   Time 6   Period Weeks   Status New     PT LONG TERM GOAL #4   Title Pt will report that she has to wear her back brace for 2 hours or < throughout the day to demonstrate improved overall core and functional strength as well as decreased pain in order to maximize overall function at home and in the community.   Time 6   Period Weeks   Status New               Plan - 12/04/16 1132     Clinical Impression Statement Pt is pleasant 56 YO F who presents to therapy with c/o chronic LBP with radicular symptoms down BLE. Presently her radicular symptoms are down her LLE only and she has deficits in BLE (L>R), functional, and core strength strength, as well as deficits in gait, balance, and increased soft tissue restrictions. Pt had increasing pain with and as time increased, her pelvic stability and trendelenberg worsened indicating decreased core strength. Pt also verbalized that she has to wear a back brace to help her back pain, indicating decreased core strength as well. Per PT note from when pt was admitted in the hospital, pt had extension preference so following her , had pt perform prone press-ups and she reported a decrease in her symptoms. Assessed SKTC and pt reported an increase in her symptoms, further supporting her extension-preference. Pt needs skilled PT intervention to maximize BLE, core, and functional strength in order to decrease pain and improve overall QOL.   Rehab Potential Fair   PT Frequency 2x / week   PT Duration 6 weeks   PT Treatment/Interventions ADLs/Self Care Home Management;Cryotherapy;Traction;Gait training;Stair training;Functional mobility training;Therapeutic activities;Therapeutic exercise;Balance training;Neuromuscular re-education;Patient/family  education;Manual techniques;Passive range of motion;Dry needling;Taping   PT Next Visit Plan review goals, HEP, HS stretch, prone quad stretch, review ab set, supine bent knee raise, bridging, sidelying clams with RTB/GTB; core and functional strengthening, SLS    PT Home Exercise Plan eval: prone press ups, LTR, ab set   Consulted and Agree with Plan of Care Patient      Patient will benefit from skilled therapeutic intervention in order to improve the following deficits and impairments:  Abnormal gait, Decreased activity tolerance, Decreased balance, Decreased endurance, Decreased mobility,  Decreased range of motion, Decreased strength, Difficulty walking, Hypomobility, Increased muscle spasms, Impaired flexibility, Improper body mechanics, Pain  Visit Diagnosis: Chronic bilateral low back pain with bilateral sciatica - Plan: PT plan of care cert/re-cert  Difficulty in walking, not elsewhere classified - Plan: PT plan of care cert/re-cert  Muscle weakness (generalized) - Plan: PT plan of care cert/re-cert      G-Codes - Jan 02, 2017 1151    Functional Assessment Tool Used (Outpatient Only) clinical judgement, 5xSTS, , MMT   Functional Limitation Mobility: Walking and moving around   Mobility: Walking and Moving Around Current Status (Z6109) At least 40 percent but less than 60 percent impaired, limited or restricted   Mobility: Walking and Moving Around Goal Status (U0454) At least 1 percent but less than 20 percent impaired, limited or restricted       Problem List Patient Active Problem List   Diagnosis Date Noted  . Pneumonia 11/19/2016  . Back pain 11/19/2016  . Constipation 04/27/2012  . GASTROESOPHAGEAL REFLUX DISEASE 06/26/2009  . SARCOIDOSIS, PULMONARY 06/23/2009  . PNEUMONIA 06/23/2009  . CHEST PAIN 06/23/2009     Jac Canavan PT, DPT   [facility] [address] [place], [place], [address] Phone: [phone]   Fax:  [phone]  Name: Zilla Shartzer MRN: [id number] Date of Birth: Apr 01, 1961

## 2016-12-04 NOTE — Patient Instructions (Signed)
  Prone press ups   Start with your hands in push up position. Press your chest up as high as you can using only your arms. Try to keep your hips on the mat and relax your stomach.  Stop the exercise and return to prone on elbow position if the pain or numbness moves further away from the lower back.   Perform 1x/day, 2-3 sets of 10 -- can do this after walking a lot to help decrease pain.   LOWER TRUNK ROTATIONS - LTR  Lying on your back with your knees bent, gently move your knees side-to-side.  Perform 2-3 sets of 10 reps to each side. Can hold for 5-10 seconds on each side   Pelvic tilt / transverse Abdominal  In supine with knees bent and feet flat on floor you want to activate your TRANSVERSE AB muscle- visualize tilting your pelvis back just slightly and drawing in your belly button toward your back bone. (basically like trying to zip up a tight pair of pants) You should feel the transverse Ab muscle pull in and away from your fingers. HOLD and repeat  Perform 2-3 sets of 10 reps holding for 3-5 seconds each, making to sure to maintain your breathing.

## 2016-12-26 DIAGNOSIS — I10 Essential (primary) hypertension: Secondary | ICD-10-CM | POA: Diagnosis not present

## 2016-12-26 DIAGNOSIS — M1990 Unspecified osteoarthritis, unspecified site: Secondary | ICD-10-CM | POA: Diagnosis not present

## 2016-12-26 DIAGNOSIS — G894 Chronic pain syndrome: Secondary | ICD-10-CM | POA: Diagnosis not present

## 2016-12-26 DIAGNOSIS — E669 Obesity, unspecified: Secondary | ICD-10-CM | POA: Diagnosis not present

## 2016-12-31 ENCOUNTER — Ambulatory Visit (HOSPITAL_COMMUNITY): Payer: Medicare Other | Attending: Pulmonary Disease | Admitting: Physical Therapy

## 2016-12-31 DIAGNOSIS — M6281 Muscle weakness (generalized): Secondary | ICD-10-CM | POA: Diagnosis not present

## 2016-12-31 DIAGNOSIS — R262 Difficulty in walking, not elsewhere classified: Secondary | ICD-10-CM | POA: Diagnosis not present

## 2016-12-31 DIAGNOSIS — M5441 Lumbago with sciatica, right side: Secondary | ICD-10-CM | POA: Diagnosis not present

## 2016-12-31 DIAGNOSIS — G8929 Other chronic pain: Secondary | ICD-10-CM | POA: Diagnosis not present

## 2016-12-31 DIAGNOSIS — M5442 Lumbago with sciatica, left side: Secondary | ICD-10-CM | POA: Insufficient documentation

## 2016-12-31 NOTE — Patient Instructions (Signed)
   Posture Correction with Lumbar Roll  Sit in firm chair with hips as far back as possible and feet flat on floor. Place roll or rolled towel behind lower back and sit back so back is supported by roll.  Use throughout your day to support your posture.    LUMBAR EXTENSIONS OVER TOWEL ROLL  With a towel roll supporting your low back and while sitting in a standard chair, extend back over the towel.  Repeat 10-15 times, twice a day.

## 2016-12-31 NOTE — Therapy (Signed)
[place], [place], [address] Phone: [phone]   Fax:  [phone]  Physical Therapy Treatment  Patient Details  Name: [name] MRN: [id number] Date of Birth: [birth year] Referring Provider: Sinda Du, MD  Encounter Date: 12/31/2016      PT End of Session - 12/31/16 0926    Visit Number 2   Number of Visits 12   Date for PT Re-Evaluation 12/25/16   Authorization Type Medicare Part A and B   Authorization Time Period 12/04/16 to 01/15/17   Authorization - Visit Number 2   Authorization - Number of Visits 10   PT Start Time 0840   PT Stop Time 0920   PT Time Calculation (min) 40 min   Activity Tolerance Patient tolerated treatment well   Behavior During Therapy [facility] for tasks assessed/performed      Past Medical History:  Diagnosis Date  . Chest pain   . GERD (gastroesophageal reflux disease)   . Hypercholesterolemia   . Pneumonia   . Pulmonary sarcoidosis ([place])    Treatment with Prednisone    Past Surgical History:  Procedure Laterality Date  . ABDOMINAL HYSTERECTOMY    . APPENDECTOMY    . CHOLECYSTECTOMY      There were no vitals filed for this visit.      Subjective Assessment - 12/31/16 0842    Subjective Patient reports she has not been here for a few weeks due to mis-understanding with scheduling appointments. Her pain has not changed with HEP, which has included a lot of lumbar mobilty exercise. Sometimes her pain is so bad that her knees will give. She has not noticed anything that makes her pain worse as she has dealt with chronic pain for quite some time.    Pertinent History pulmonary sarcoidosis, GERD   Patient Stated Goals stop pain, ease up the pain   Currently in Pain? Yes   Pain Score 10-Worst pain ever  15/10- "this is normal". No signs of severe distress noted    Pain Location Other (Comment)  lumbar spine and can shoot down either leg    Pain Orientation  Lower;Right;Left   Pain Descriptors / Indicators Shooting;Stabbing;Aching;Throbbing   Pain Type Chronic pain   Pain Radiating Towards can radiate into either    Pain Onset More than a month ago   Pain Frequency Constant   Aggravating Factors  moving around    Pain Relieving Factors rest    Effect of Pain on Daily Activities slows her down             [facility] PT Assessment - 12/31/16 0001      Observation/Other Assessments   Observations LLD with R LE being longer than L, able to correct with MET                      [facility] Adult PT Treatment/Exercise - 12/31/16 0001      Exercises   Exercises Lumbar     Lumbar Exercises: Stretches   Active Hamstring Stretch Limitations attempted but increaed lumbar pain    Piriformis Stretch 2 reps;30 seconds     Lumbar Exercises: Seated   Other Seated Lumbar Exercises lumbar extension over chair with towel roll 3x15      Lumbar Exercises: Supine   Ab Set 15 reps;3 seconds   Bent Knee Raise 10 reps;1 second   Bent Knee Raise Limitations with core set    Bridge 10 reps  Other Supine Lumbar Exercises oppostie/alternating UE/LE raises  (2 sets)                 PT Education - 12/31/16 0923    Education provided Yes   Education Details review of initial eval/goals; education regarding chronic pain; HEP updates. Discussed general timeline for recovery with PT for LBP    Person(s) Educated Patient   Methods Explanation;Demonstration;Handout   Comprehension Verbalized understanding;Returned demonstration;Need further instruction          PT Short Term Goals - 12/04/16 1142      PT SHORT TERM GOAL #1   Title Pt will be independent with HEP and perform consistently to promote return to PLOF and decrease pain.   Time 3   Period Weeks   Status New     PT SHORT TERM GOAL #2   Title Pt will have improved 5xSTS to <10 seconds to demonstrate improved overall functional strength to maximize function at home.    Time 3    Period Weeks   Status New     PT SHORT TERM GOAL #3   Title Pt will have improved SLS on BLE to 30 seconds or greater with no UE support to maximize gait and demo improved overall function.   Time 3   Period Weeks   Status New           PT Long Term Goals - 12/04/16 1144      PT LONG TERM GOAL #1   Title Pt will report being able to tolerate cooking for 1 hour or > with no more than 4/10 pain in order to maximize participation at home.   Time 6   Period Weeks   Status New     PT LONG TERM GOAL #2   Title Pt will have improved 3MWT by 121f or > with proper gait mechanics and without an increase in pain to demonstrate improved overall function and maximize participation in the community.   Time 6   Period Weeks   Status New     PT LONG TERM GOAL #3   Title Pt will have improved overall BLE strength to 5/5 to maximize gait and functional movements in order to decrease pain and promote return to PLOF.   Time 6   Period Weeks   Status New     PT LONG TERM GOAL #4   Title Pt will report that she has to wear her back brace for 2 hours or < throughout the day to demonstrate improved overall core and functional strength as well as decreased pain in order to maximize overall function at home and in the community.   Time 6   Period Weeks   Status New               Plan - 12/31/16 00160   Clinical Impression Statement Patient arrives approximately weeks after evaluation, she did not schedule sessions due to confusion on when she was to schedule appointments per patient. Educated regarding initial eval/goals and chronic pain as well as typical course of symptom improvement with LBP. Noted LLD and was able to correct with MET with no apparent significant effect on pain today, otherwise performed core and proximal strengthening per PT POC. Also performed lumbar extensions over towel roll and provided HEP updates as well.    Rehab Potential Fair   PT Frequency 2x / week   PT  Duration 6 weeks   PT Treatment/Interventions ADLs/Self Care Home Management;Cryotherapy;Traction;Gait training;Stair  training;Functional mobility training;Therapeutic activities;Therapeutic exercise;Balance training;Neuromuscular re-education;Patient/family education;Manual techniques;Passive range of motion;Dry needling;Taping   PT Next Visit Plan continue with functional stretching and core activation, intorduce quad stretch prone and sidelying clams. Continue extension program due to symptom improvement.    PT Home Exercise Plan eval: prone press ups, LTR, ab set; 5/22: use of towel roll and lumbar extensions over towel roll    Consulted and Agree with Plan of Care Patient      Patient will benefit from skilled therapeutic intervention in order to improve the following deficits and impairments:  Abnormal gait, Decreased activity tolerance, Decreased balance, Decreased endurance, Decreased mobility, Decreased range of motion, Decreased strength, Difficulty walking, Hypomobility, Increased muscle spasms, Impaired flexibility, Improper body mechanics, Pain  Visit Diagnosis: Chronic bilateral low back pain with bilateral sciatica  Difficulty in walking, not elsewhere classified  Muscle weakness (generalized)     Problem List Patient Active Problem List   Diagnosis Date Noted  . Pneumonia 11/19/2016  . Back pain 11/19/2016  . Constipation 04/27/2012  . GASTROESOPHAGEAL REFLUX DISEASE 06/26/2009  . SARCOIDOSIS, PULMONARY 06/23/2009  . PNEUMONIA 06/23/2009  . CHEST PAIN 06/23/2009    Deniece Ree PT, DPT [place] [address] [place], [place], [address] Phone: [phone]   Fax:  [phone]  Name: Kaliyan Osbourn MRN: [id number] Date of Birth: 1960-11-10

## 2017-01-02 ENCOUNTER — Ambulatory Visit (HOSPITAL_COMMUNITY): Payer: Medicare Other | Admitting: Physical Therapy

## 2017-01-02 DIAGNOSIS — M6281 Muscle weakness (generalized): Secondary | ICD-10-CM | POA: Diagnosis not present

## 2017-01-02 DIAGNOSIS — M5442 Lumbago with sciatica, left side: Secondary | ICD-10-CM | POA: Diagnosis not present

## 2017-01-02 DIAGNOSIS — M5441 Lumbago with sciatica, right side: Principal | ICD-10-CM

## 2017-01-02 DIAGNOSIS — R262 Difficulty in walking, not elsewhere classified: Secondary | ICD-10-CM | POA: Diagnosis not present

## 2017-01-02 DIAGNOSIS — G8929 Other chronic pain: Secondary | ICD-10-CM

## 2017-01-02 NOTE — Therapy (Signed)
[facility] [address] [place], [place], [address] Phone: [phone]   Fax:  [phone]  Physical Therapy Treatment  Patient Details  Name: Allison RaringBarbara Chase MRN: [id number] Date of Birth: 18-Jul-1961 Referring Provider: Kari BaarsEdward Hawkins, MD  Encounter Date: 01/02/2017      PT End of Session - 01/02/17 0904    Visit Number 3   Number of Visits 12   Date for PT Re-Evaluation 12/25/16   Authorization Type Medicare Part A and B   Authorization Time Period 12/04/16 to 01/15/17   Authorization - Visit Number 2   Authorization - Number of Visits 10   PT Start Time 0825  Pt arrived late    PT Stop Time 0900   PT Time Calculation (min) 35 min   Activity Tolerance Patient tolerated treatment well   Behavior During Therapy [facility] for tasks assessed/performed      Past Medical History:  Diagnosis Date  . Chest pain   . GERD (gastroesophageal reflux disease)   . Hypercholesterolemia   . Pneumonia   . Pulmonary sarcoidosis (HCC)    Treatment with Prednisone    Past Surgical History:  Procedure Laterality Date  . ABDOMINAL HYSTERECTOMY    . APPENDECTOMY    . CHOLECYSTECTOMY      There were no vitals filed for this visit.      Subjective Assessment - 01/02/17 0828    Subjective Pt reports that things are going well. She says she is doing her HEP but continues to have low back pain.    Pertinent History pulmonary sarcoidosis, GERD   Patient Stated Goals stop pain, ease up the pain   Currently in Pain? Other (Comment)  no change from last session    Pain Onset More than a month ago                         [facility]/Exercise - 01/02/17 0001      Lumbar Exercises: Supine   Other Supine Lumbar Exercises low trunk rotation Lt and Rt x20 reps each side    Other Supine Lumbar Exercises UE pressdown with single knee to chest x10 reps each      Lumbar Exercises: Prone   Other Prone Lumbar Exercises  repeated extension x15 reps with centralization of symptoms, followed by another set of 15 reps, addition of over pressure x10 reps with pt unable to identify symptoms     Manual Therapy   Manual Therapy Soft tissue mobilization   Manual therapy comments separate rest of session   Soft tissue mobilization STM along Lt and Rt Ql/iliocostalis                 PT Education - 01/02/17 1029    Education provided Yes   Education Details importance of continuing with HEP to improve pain and strength; encouraged pt to pace herself with daily activity to improve her tolerance over time; imlpications for manual techniques and heat application at home 15-20 min    Person(s) Educated Patient   Methods Explanation;Verbal cues   Comprehension Returned demonstration;Verbalized understanding          PT Short Term Goals - 12/04/16 1142      PT SHORT TERM GOAL #1   Title Pt will be independent with HEP and perform consistently to promote return to PLOF and decrease pain.   Time 3   Period Weeks   Status New  PT SHORT TERM GOAL #2   Title Pt will have improved 5xSTS to <10 seconds to demonstrate improved overall functional strength to maximize function at home.    Time 3   Period Weeks   Status New     PT SHORT TERM GOAL #3   Title Pt will have improved SLS on BLE to 30 seconds or greater with no UE support to maximize gait and demo improved overall function.   Time 3   Period Weeks   Status New           PT Long Term Goals - 12/04/16 1144      PT LONG TERM GOAL #1   Title Pt will report being able to tolerate cooking for 1 hour or > with no more than 4/10 pain in order to maximize participation at home.   Time 6   Period Weeks   Status New     PT LONG TERM GOAL #2   Title Pt will have improved by 164ft or > with proper gait mechanics and without an increase in pain to demonstrate improved overall function and maximize participation in the community.   Time 6    Period Weeks   Status New     PT LONG TERM GOAL #3   Title Pt will have improved overall BLE strength to 5/5 to maximize gait and functional movements in order to decrease pain and promote return to PLOF.   Time 6   Period Weeks   Status New     PT LONG TERM GOAL #4   Title Pt will report that she has to wear her back brace for 2 hours or < throughout the day to demonstrate improved overall core and functional strength as well as decreased pain in order to maximize overall function at home and in the community.   Time 6   Period Weeks   Status New               Plan - 01/02/17 1610    Clinical Impression Statement Pt reports continued symptoms in her low back. Session began with trial of repeated extension with pt reporting centralization of her symptoms in prone. Therapist attempted to progress the treatment with gentle over pressure, however pt was unable to describe the nature/location of her symptoms stating "it just hurts", which greatly limited the remainder of this treatment. Followed with activity to improve lumbar mobility and core strength and ended with STM along the lumbar paraspinals. Pt reporting improvements in her pain following manual techniques, however when asked if her pain was better/worse/same, she reported no change from the beginning of her session. Will continue with current POC   Rehab Potential Fair   PT Frequency 2x / week   PT Duration 6 weeks   PT Treatment/Interventions ADLs/Self Care Home Management;Cryotherapy;Traction;Gait training;Stair training;Functional mobility training;Therapeutic activities;Therapeutic exercise;Balance training;Neuromuscular re-education;Patient/family education;Manual techniques;Passive range of motion;Dry needling;Taping   PT Next Visit Plan continue with functional stretching and core activation, intorduce quad stretch prone and sidelying clams. Continue extension program due to symptom improvement.    PT Home Exercise Plan  eval: prone press ups, LTR, ab set; 5/22: use of towel roll and lumbar extensions over towel roll    Consulted and Agree with Plan of Care Patient      Patient will benefit from skilled therapeutic intervention in order to improve the following deficits and impairments:  Abnormal gait, Decreased activity tolerance, Decreased balance, Decreased endurance, Decreased mobility, Decreased range of motion,  Decreased strength, Difficulty walking, Hypomobility, Increased muscle spasms, Impaired flexibility, Improper body mechanics, Pain  Visit Diagnosis: Chronic bilateral low back pain with bilateral sciatica  Difficulty in walking, not elsewhere classified  Muscle weakness (generalized)     Problem List Patient Active Problem List   Diagnosis Date Noted  . Pneumonia 11/19/2016  . Back pain 11/19/2016  . Constipation 04/27/2012  . GASTROESOPHAGEAL REFLUX DISEASE 06/26/2009  . SARCOIDOSIS, PULMONARY 06/23/2009  . PNEUMONIA 06/23/2009  . CHEST PAIN 06/23/2009   10:32 AM,01/02/17 Marylyn Ishihara PT, DPT [facility] [phone]  [facility] [address] [place], [place], [address] Phone: [phone]   Fax:  [phone]  Name: Beatris Belen MRN: [id number] Date of Birth: 1960-12-28

## 2017-01-08 ENCOUNTER — Telehealth (HOSPITAL_COMMUNITY): Payer: Self-pay

## 2017-01-08 ENCOUNTER — Ambulatory Visit (HOSPITAL_COMMUNITY): Payer: Medicare Other

## 2017-01-08 NOTE — Telephone Encounter (Signed)
No show, called and spoke to pt. who thought apt was scheduled for Thursday.  Pt added to wait list and reminded next scheduled apt date and time, pt  plans to make it.  Reminded contact info as well.    [address]Casey [place]ockerham, LPTA; CBIS [phone]

## 2017-01-09 ENCOUNTER — Ambulatory Visit (HOSPITAL_COMMUNITY): Payer: Medicare Other | Admitting: Physical Therapy

## 2017-01-09 DIAGNOSIS — M6281 Muscle weakness (generalized): Secondary | ICD-10-CM

## 2017-01-09 DIAGNOSIS — G8929 Other chronic pain: Secondary | ICD-10-CM | POA: Diagnosis not present

## 2017-01-09 DIAGNOSIS — R262 Difficulty in walking, not elsewhere classified: Secondary | ICD-10-CM

## 2017-01-09 DIAGNOSIS — M5442 Lumbago with sciatica, left side: Principal | ICD-10-CM

## 2017-01-09 DIAGNOSIS — M5441 Lumbago with sciatica, right side: Secondary | ICD-10-CM | POA: Diagnosis not present

## 2017-01-09 NOTE — Patient Instructions (Signed)
Backward Bend (Standing)    Arch backward to make hollow of back deeper. Hold ___2_ seconds. Repeat __5__ times per set. Do __1__ sets per session. Do 2 ____ sessions per day.  http://orth.exer.us/178   Copyright  VHI. All rights reserved.  Hamstring Stretch: Active    Support behind right knee. Starting with knee bent, attempt to straighten knee until a comfortable stretch is felt in back of thigh. Hold __30__ seconds. Repeat _3___ times per set. Do ___1_ sets per session. Do __2__ sessions per day.  http://orth.exer.us/158   Copyright  VHI. All rights reserved.  On Elbows (Prone)    Rise up on elbows as high as possible, keeping hips on floor. Hold __60__ seconds. Repeat __2__ times per set. Do ____ sets per session. Do _2___ sessions per day. 1 http://orth.exer.us/92   Copyright  VHI. All rights reserved.  Press-Up    Press upper body upward, keeping hips in contact with floor. Keep lower back and buttocks relaxed. Hold ___3_ seconds. Repeat _5-10___ times per set. Do 1____ sets per session. Do __2__ sessions per day.  http://orth.exer.us/94   Copyright  VHI. All rights reserved.  Isometric Abdominal    Lying on back with knees bent, tighten stomach by pressing elbows down. Hold __3__ seconds. Repeat _10___ times per set. Do _1___ sets per session. Do _3___ sessions per day.  http://orth.exer.us/1086   Copyright  VHI. All rights reserved.  Bridging    Slowly raise buttocks from floor, keeping stomach tight. Repeat ___10_ times per set. Do __1__ sets per session. Do _3___ sessions per day.  http://orth.exer.us/1096   CCopyright  VHI. All rights reserved.  Bent Leg Lift (Hook-Lying)    Tighten stomach and slowly raise right leg ____2 inches from floor. Keep trunk rigid. Hold _5___ seconds. Repeat __3__ times per set. Do _1___ sets per session. Do __2__ sessions per day.  http://orth.exer.us/1090   Copyright  VHI. All rights reserved.

## 2017-01-09 NOTE — Therapy (Signed)
[facility] [address] [place], [place], [address] Phone: [phone]   Fax:  [phone]  Physical Therapy Treatment  Patient Details  Name: Allison RaringBarbara Jovel MRN: [id number] Date of Birth: 10/21/1960 Referring Provider: Kari BaarsEdward Hawkins, MD  Encounter Date: 01/09/2017      PT End of Session - 01/09/17 1321    Visit Number 4   Number of Visits 12   Date for PT Re-Evaluation 12/25/16   Authorization Type Medicare Part A and B   Authorization Time Period 12/04/16 to 01/15/17   Authorization - Visit Number 4   Authorization - Number of Visits 10   PT Start Time 1305   PT Stop Time 1345   PT Time Calculation (min) 40 min   Activity Tolerance Patient limited by pain   Behavior During Therapy [facility] for tasks assessed/performed      Past Medical History:  Diagnosis Date  . Chest pain   . GERD (gastroesophageal reflux disease)   . Hypercholesterolemia   . Pneumonia   . Pulmonary sarcoidosis (HCC)    Treatment with Prednisone    Past Surgical History:  Procedure Laterality Date  . ABDOMINAL HYSTERECTOMY    . APPENDECTOMY    . CHOLECYSTECTOMY      There were no vitals filed for this visit.      Subjective Assessment - 01/09/17 1305    Subjective Pt states that her back is bothering her more than usual.  Today is one of her worst days    Pertinent History pulmonary sarcoidosis, GERD   Patient Stated Goals stop pain, ease up the pain   Currently in Pain? Yes   Pain Score --  20/10: today is equal pain right and left.    Pain Radiating Towards buttock    Pain Onset More than a month ago   Aggravating Factors  motion    Pain Relieving Factors nothing                          [facility] Adult PT Treatment/Exercise - 01/09/17 0001      Exercises   Exercises Lumbar     Lumbar Exercises: Stretches   Active Hamstring Stretch 2 reps;30 seconds   Standing Extension 5 reps   Prone on Elbows Stretch 5 reps;30 seconds    Press Ups 5 reps     Lumbar Exercises: Supine   Bent Knee Raise 10 reps; bridge, ab set all x 10     Lumbar Exercises: Prone   Straight Leg Raise 5 reps   Other Prone Lumbar Exercises glut set x 10    Other Prone Lumbar Exercises heel squeeze x 10                 PT Education - 01/09/17 1320    Education provided Yes   Education Details reviewed anatomy of the back; why extension is good for bulging discs while flexion is detrimental.    Person(s) Educated Patient   Methods Explanation   Comprehension Verbalized understanding          PT Short Term Goals - 12/04/16 1142      PT SHORT TERM GOAL #1   Title Pt will be independent with HEP and perform consistently to promote return to PLOF and decrease pain.   Time 3   Period Weeks   Status New     PT SHORT TERM GOAL #2   Title Pt will have improved 5xSTS to <  10 seconds to demonstrate improved overall functional strength to maximize function at home.    Time 3   Period Weeks   Status New     PT SHORT TERM GOAL #3   Title Pt will have improved SLS on BLE to 30 seconds or greater with no UE support to maximize gait and demo improved overall function.   Time 3   Period Weeks   Status New           PT Long Term Goals - 12/04/16 1144      PT LONG TERM GOAL #1   Title Pt will report being able to tolerate cooking for 1 hour or > with no more than 4/10 pain in order to maximize participation at home.   Time 6   Period Weeks   Status New     PT LONG TERM GOAL #2   Title Pt will have improved by 154ft or > with proper gait mechanics and without an increase in pain to demonstrate improved overall function and maximize participation in the community.   Time 6   Period Weeks   Status New     PT LONG TERM GOAL #3   Title Pt will have improved overall BLE strength to 5/5 to maximize gait and functional movements in order to decrease pain and promote return to PLOF.   Time 6   Period Weeks   Status New      PT LONG TERM GOAL #4   Title Pt will report that she has to wear her back brace for 2 hours or < throughout the day to demonstrate improved overall core and functional strength as well as decreased pain in order to maximize overall function at home and in the community.   Time 6   Period Weeks   Status New               Plan - 01/09/17 1322    Clinical Impression Statement Added prone exercises to patient HEP, reviewed the anatomy of the back and how body mechanics and extension can play an important role in healing.   Rehab Potential Fair   PT Frequency 2x / week   PT Duration 6 weeks   PT Treatment/Interventions ADLs/Self Care Home Management;Cryotherapy;Traction;Gait training;Stair training;Functional mobility training;Therapeutic activities;Therapeutic exercise;Balance training;Neuromuscular re-education;Patient/family education;Manual techniques;Passive range of motion;Dry needling;Taping   PT Next Visit Plan continue with  core activation, intorduce quad stretch prone and sidelying clams. Continue extension program due to symptom improvement.    PT Home Exercise Plan eval: prone press ups, LTR, ab set; 5/22: use of towel roll and lumbar extensions over towel roll    Consulted and Agree with Plan of Care Patient      Patient will benefit from skilled therapeutic intervention in order to improve the following deficits and impairments:  Abnormal gait, Decreased activity tolerance, Decreased balance, Decreased endurance, Decreased mobility, Decreased range of motion, Decreased strength, Difficulty walking, Hypomobility, Increased muscle spasms, Impaired flexibility, Improper body mechanics, Pain  Visit Diagnosis: Chronic bilateral low back pain with bilateral sciatica  Difficulty in walking, not elsewhere classified  Muscle weakness (generalized)     Problem List Patient Active Problem List   Diagnosis Date Noted  . Pneumonia 11/19/2016  . Back pain 11/19/2016  .  Constipation 04/27/2012  . GASTROESOPHAGEAL REFLUX DISEASE 06/26/2009  . SARCOIDOSIS, PULMONARY 06/23/2009  . PNEUMONIA 06/23/2009  . CHEST PAIN 06/23/2009   Virgina Organ, PT CLT [phone] 01/09/2017, 1:44 PM  [place] [facility]  [facility] [address] [place], [place], [address] Phone: [phone]   Fax:  [phone]  Name: Olamae Ferrara MRN: [id number] Date of Birth: Feb 08, 1961

## 2017-01-13 ENCOUNTER — Ambulatory Visit (HOSPITAL_COMMUNITY): Payer: Medicare Other | Admitting: Physical Therapy

## 2017-01-15 ENCOUNTER — Ambulatory Visit (HOSPITAL_COMMUNITY): Payer: Medicare Other | Attending: Pulmonary Disease

## 2017-01-15 ENCOUNTER — Encounter (HOSPITAL_COMMUNITY): Payer: Self-pay

## 2017-01-15 DIAGNOSIS — G8929 Other chronic pain: Secondary | ICD-10-CM | POA: Diagnosis not present

## 2017-01-15 DIAGNOSIS — M5441 Lumbago with sciatica, right side: Secondary | ICD-10-CM | POA: Insufficient documentation

## 2017-01-15 DIAGNOSIS — R262 Difficulty in walking, not elsewhere classified: Secondary | ICD-10-CM | POA: Insufficient documentation

## 2017-01-15 DIAGNOSIS — M5442 Lumbago with sciatica, left side: Secondary | ICD-10-CM | POA: Insufficient documentation

## 2017-01-15 DIAGNOSIS — M6281 Muscle weakness (generalized): Secondary | ICD-10-CM | POA: Diagnosis not present

## 2017-01-15 NOTE — Therapy (Signed)
[place], [place], [address] Phone: [phone]   Fax:  [phone]  Physical Therapy Treatment/Reassessment  Patient Details  Name: [name] MRN: [id number] Date of Birth: 1961/01/04 Referring Provider: Sinda Du, MD  Encounter Date: 01/15/2017      PT End of Session - 01/15/17 0813    Visit Number 5   Number of Visits 20   Date for PT Re-Evaluation 12/25/16   Authorization Type Medicare Part A and B   Authorization Time Period 01/15/17 to 02/12/17   Authorization - Visit Number 5   Authorization - Number of Visits 20   PT Start Time 0816   PT Stop Time 0857   PT Time Calculation (min) 41 min   Activity Tolerance Patient limited by pain   Behavior During Therapy [facility] for tasks assessed/performed      Past Medical History:  Diagnosis Date  . Chest pain   . GERD (gastroesophageal reflux disease)   . Hypercholesterolemia   . Pneumonia   . Pulmonary sarcoidosis ([facility])    Treatment with Prednisone    Past Surgical History:  Procedure Laterality Date  . ABDOMINAL HYSTERECTOMY    . APPENDECTOMY    . CHOLECYSTECTOMY      There were no vitals filed for this visit.      Subjective Assessment - 01/15/17 0818    Subjective Pt states that she feels like she could barf she's in so much pain today and has been for the last few days. She said she's not sure if it's the sarcoidosis acting up as well or what.    Pertinent History pulmonary sarcoidosis, GERD   Patient Stated Goals stop pain, ease up the pain   Currently in Pain? Yes   Pain Score --  15/10, "that means I'm ready to cry"   Pain Location Back   Pain Orientation Lower;Right;Left   Pain Descriptors / Indicators Aching;Throbbing;Stabbing;Shooting   Pain Type Chronic pain   Pain Onset More than a month ago   Pain Frequency Constant   Aggravating Factors  motion   Pain Relieving Factors nothing   Effect of Pain on Daily Activities slows  her down            [facility] PT Assessment - 01/15/17 0001      Strength   Right Hip Extension 4/5  was 3+   Right Hip ABduction 4+/5  was 4-   Left Hip Extension 4/5  was 2+   Left Hip ABduction 4+/5  was 3+   Right Knee Flexion 5/5  was 4+   Right Knee Extension 5/5   Left Knee Flexion 5/5  was 4+   Left Knee Extension 5/5     Ambulation/Gait   Ambulation Distance (Feet) 452 Feet  3MWT   Gait Pattern Step-through pattern   Gait Comments decr pelvic control, bil trendelenberg L>R; pt reported increasing pain and was reaching for the wall during test     Balance   Balance Assessed Yes     Static Standing Balance   Static Standing - Balance Support No upper extremity supported   Static Standing Balance -  Activities  Single Leg Stance - Right Leg;Single Leg Stance - Left Leg   Static Standing - Comment/# of Minutes R: 14.79 sec L: 3.32 sec     Standardized Balance Assessment   Five times sit to stand comments  13.87 sec from chair with no hands  [facility] Adult PT Treatment/Exercise - 01/15/17 0001      Lumbar Exercises: Stretches   Active Hamstring Stretch 2 reps;30 seconds   Active Hamstring Stretch Limitations BLE, supine with sheet   Single Knee to Chest Stretch 2 reps;30 seconds   Single Knee to Chest Stretch Limitations BLE with sheet   Quad Stretch 2 reps;30 seconds   Quad Stretch Limitations prone with sheet                   PT Education - 01/15/17 0856    Education provided Yes   Education Details reassessment findings, POC, HEP   Person(s) Educated Patient   Methods Explanation;Demonstration;Handout   Comprehension Verbalized understanding;Returned demonstration          PT Short Term Goals - 01/15/17 0822      PT SHORT TERM GOAL #1   Title Pt will be independent with HEP and perform consistently to promote return to PLOF and decrease pain.   Time 3   Period Weeks   Status Achieved     PT SHORT TERM  GOAL #2   Title Pt will have improved 5xSTS to <10 seconds to demonstrate improved overall functional strength to maximize function at home.    Baseline 6/6: 13.87 sec   Time 3   Period Weeks   Status On-going     PT SHORT TERM GOAL #3   Title Pt will have improved SLS on BLE to 30 seconds or greater with no UE support to maximize gait and demo improved overall function.   Baseline 6/6: R: 14 sec, L: 3 sec   Time 3   Period Weeks   Status On-going           PT Long Term Goals - 01/15/17 9381      PT LONG TERM GOAL #1   Title Pt will report being able to tolerate cooking for 1 hour or > with no more than 4/10 pain in order to maximize participation at home.   Baseline 6/6: pt still has to sit down during cooking and her pain gets up to 9/10 "on a good day"   Time 6   Period Weeks   Status On-going     PT LONG TERM GOAL #2   Title Pt will have improved 3MWT by 15f or > with proper gait mechanics and without an increase in pain to demonstrate improved overall function and maximize participation in the community.   Baseline 6/6: [id number] continued poor gait mechanics   Time 6   Period Weeks   Status On-going     PT LONG TERM GOAL #3   Title Pt will have improved overall BLE strength to 5/5 to maximize gait and functional movements in order to decrease pain and promote return to PLOF.   Baseline 6/6: pt improved in all MMT, but is not 5/5 throughout   Time 6   Period Weeks   Status On-going     PT LONG TERM GOAL #4   Title Pt will report that she has to wear her back brace for 2 hours or < throughout the day to demonstrate improved overall core and functional strength as well as decreased pain in order to maximize overall function at home and in the community.   Baseline 6/6: pt stopped wearing her brace after the initial evaluation   Time 6   Period Weeks   Status Achieved  Plan - 02-03-2017 0903    Clinical Impression Statement PT reassessed pt's  goals and outcome measures this date. Pt has met 1 STG and 1 LTG, while all others are still on-going. Pt has made improvements in her 5xSTS, MMT, and she states that her tolerance to standing has improved since beginning therapy. She reports feeling 10% better since starting therapy stating that her movement has improved and she can now get in her pool, but that the remaining 90% is that she is still in pain. Due to pt's progress with limited number of visits, recommend continuing PT services for another 4 weeks to continue to promote improved function and overall QOL. Pt's pain rating was 15/10 at beginning of session and was 11/10 at EOS following stretches.    Rehab Potential Fair   PT Frequency 2x / week   PT Duration 4 weeks   PT Treatment/Interventions ADLs/Self Care Home Management;Cryotherapy;Traction;Gait training;Stair training;Functional mobility training;Therapeutic activities;Therapeutic exercise;Balance training;Neuromuscular re-education;Patient/family education;Manual techniques;Passive range of motion;Dry needling;Taping   PT Next Visit Plan continue quad stretch in prone and HS stretch; continue with functional stretching and core activation; initiate sidelying clams. Continue extension program due to symptom improvement. trial standing bil pulldowns with front foot elevated on step to engage core; SLS, side stepping with band   PT Home Exercise Plan eval: prone press ups, LTR, ab set; 5/22: use of towel roll and lumbar extensions over towel roll; 6/6: prone quad stretch with sheet   Consulted and Agree with Plan of Care Patient      Patient will benefit from skilled therapeutic intervention in order to improve the following deficits and impairments:  Abnormal gait, Decreased activity tolerance, Decreased balance, Decreased endurance, Decreased mobility, Decreased range of motion, Decreased strength, Difficulty walking, Hypomobility, Increased muscle spasms, Impaired flexibility,  Improper body mechanics, Pain  Visit Diagnosis: Chronic bilateral low back pain with bilateral sciatica - Plan: PT plan of care cert/re-cert  Difficulty in walking, not elsewhere classified - Plan: PT plan of care cert/re-cert  Muscle weakness (generalized) - Plan: PT plan of care cert/re-cert       G-Codes - 02/03/17 0915    Functional Assessment Tool Used (Outpatient Only) clinical judgement, 5xSTS, 3MWT, MMT   Functional Limitation Mobility: Walking and moving around   Mobility: Walking and Moving Around Current Status (X8338) At least 40 percent but less than 60 percent impaired, limited or restricted   Mobility: Walking and Moving Around Goal Status (S5053) At least 1 percent but less than 20 percent impaired, limited or restricted      Problem List Patient Active Problem List   Diagnosis Date Noted  . Pneumonia 11/19/2016  . Back pain 11/19/2016  . Constipation 04/27/2012  . GASTROESOPHAGEAL REFLUX DISEASE 06/26/2009  . SARCOIDOSIS, PULMONARY 06/23/2009  . PNEUMONIA 06/23/2009  . CHEST PAIN 06/23/2009     Geraldine Solar PT, DPT   [facility] [address] [place], [place], [address] Phone: [phone]   Fax:  [phone]  Name: [name] MRN: [id number] Date of Birth: 18-Oct-1960

## 2017-01-15 NOTE — Patient Instructions (Signed)
  Prone Quad Stretch  Lie down flat on your stomach. Wrap a strap (belt, towel, dog leash) around the top of one of your feet and pull the strap across your opposite shoulder so that your knee starts to curl up to your body. Pull until a stretch is felt across the front of your thigh.   Perform 1x/day, 2-3 stretches holding for 30 seconds on each leg

## 2017-01-17 ENCOUNTER — Ambulatory Visit (HOSPITAL_COMMUNITY): Payer: Medicare Other | Admitting: Physical Therapy

## 2017-01-20 ENCOUNTER — Encounter (HOSPITAL_COMMUNITY): Payer: Medicare Other

## 2017-01-21 ENCOUNTER — Telehealth (HOSPITAL_COMMUNITY): Payer: Self-pay | Admitting: Physical Therapy

## 2017-01-21 ENCOUNTER — Ambulatory Visit (HOSPITAL_COMMUNITY): Payer: Medicare Other | Admitting: Physical Therapy

## 2017-01-21 NOTE — Telephone Encounter (Signed)
Patient a no-show for this session. Attempted to call both numbers listed in chart, however 1st number was busy and someone hung up the phone when I called the 2nd number. Unable to reach patient.  Nedra HaiKristen Aviyon Hocevar PT, DPT [phone]

## 2017-01-22 ENCOUNTER — Telehealth (HOSPITAL_COMMUNITY): Payer: Self-pay

## 2017-01-22 ENCOUNTER — Ambulatory Visit (HOSPITAL_COMMUNITY): Payer: Medicare Other

## 2017-01-22 NOTE — Telephone Encounter (Signed)
No show #2; pt stated that she was with her mother who had hip surgery. She apologized for not calling to cancel appointment. She stated she planned on being at her next appointment on 6/19.  Jac CanavanBrooke Powell PT, DPT

## 2017-01-27 ENCOUNTER — Encounter (HOSPITAL_COMMUNITY): Payer: Medicare Other

## 2017-01-28 ENCOUNTER — Ambulatory Visit (HOSPITAL_COMMUNITY): Payer: Medicare Other | Admitting: Physical Therapy

## 2017-01-28 DIAGNOSIS — M5441 Lumbago with sciatica, right side: Principal | ICD-10-CM

## 2017-01-28 DIAGNOSIS — G8929 Other chronic pain: Secondary | ICD-10-CM | POA: Diagnosis not present

## 2017-01-28 DIAGNOSIS — M5442 Lumbago with sciatica, left side: Principal | ICD-10-CM

## 2017-01-28 DIAGNOSIS — R262 Difficulty in walking, not elsewhere classified: Secondary | ICD-10-CM | POA: Diagnosis not present

## 2017-01-28 DIAGNOSIS — M6281 Muscle weakness (generalized): Secondary | ICD-10-CM

## 2017-01-28 NOTE — Therapy (Signed)
[facility] [address] [place], [place], [address] Phone: [phone]   Fax:  [phone]  Physical Therapy Treatment  Patient Details  Name: [name] MRN: [id number] Date of Birth: 1961/07/10 Referring Provider: Kari Baars, MD  Encounter Date: 01/28/2017      PT End of Session - 01/28/17 0933    Visit Number 6   Number of Visits 20   Date for PT Re-Evaluation 12/25/16   Authorization Type Medicare Part A and B   Authorization Time Period 01/15/17 to 02/12/17   Authorization - Visit Number 6   Authorization - Number of Visits 20   PT Start Time 0854   PT Stop Time 0935   PT Time Calculation (min) 41 min   Activity Tolerance Patient tolerated treatment well   Behavior During Therapy [facility] for tasks assessed/performed      Past Medical History:  Diagnosis Date  . Chest pain   . GERD (gastroesophageal reflux disease)   . Hypercholesterolemia   . Pneumonia   . Pulmonary sarcoidosis ([facility])    Treatment with Prednisone    Past Surgical History:  Procedure Laterality Date  . ABDOMINAL HYSTERECTOMY    . APPENDECTOMY    . CHOLECYSTECTOMY      There were no vitals filed for this visit.      Subjective Assessment - 01/28/17 0850    Subjective Pt states that her back still hurts; she is doing her exercises .     Pertinent History pulmonary sarcoidosis, GERD   How long can you sit comfortably? 5-10 minutes    How long can you stand comfortably? rocks as long as she is rocking she is ok   How long can you walk comfortably? walking is better.    Patient Stated Goals stop pain, ease up the pain   Currently in Pain? Yes   Pain Score 9    Pain Location Back   Pain Orientation Right   Pain Descriptors / Indicators Aching   Pain Type Chronic pain   Pain Radiating Towards below buttock    Pain Onset More than a month ago   Aggravating Factors  bend forward   Pain Relieving Factors cold water.    Effect of Pain on Daily  Activities increases                         [facility] Adult PT Treatment/Exercise - 01/28/17 0001      Exercises   Exercises Lumbar     Lumbar Exercises: Stretches   Active Hamstring Stretch 3 reps;30 seconds   Single Knee to Chest Stretch 2 reps;30 seconds   Standing Side Bend 5 reps   Standing Extension 5 reps     Lumbar Exercises: Standing   Wall Slides 10 reps   Wall Slides Limitations ball    Other Standing Lumbar Exercises B pull down with green t band foot on 12" step 10RT ; 10 LT      Lumbar Exercises: Seated   Long Arc Quad on Curtice 10 reps   Hip Flexion on [place] 10 reps     Lumbar Exercises: Sidelying   Clam 10 reps     Lumbar Exercises: Prone   Straight Leg Raise 5 reps                  PT Short Term Goals - 01/15/17 6962      PT SHORT TERM GOAL #1   Title Pt  will be independent with HEP and perform consistently to promote return to PLOF and decrease pain.   Time 3   Period Weeks   Status Achieved     PT SHORT TERM GOAL #2   Title Pt will have improved 5xSTS to <10 seconds to demonstrate improved overall functional strength to maximize function at home.    Baseline 6/6: 13.87 sec   Time 3   Period Weeks   Status On-going     PT SHORT TERM GOAL #3   Title Pt will have improved SLS on BLE to 30 seconds or greater with no UE support to maximize gait and demo improved overall function.   Baseline 6/6: R: 14 sec, L: 3 sec   Time 3   Period Weeks   Status On-going           PT Long Term Goals - 01/15/17 [id number]      PT LONG TERM GOAL #1   Title Pt will report being able to tolerate cooking for 1 hour or > with no more than 4/10 pain in order to maximize participation at home.   Baseline 6/6: pt still has to sit down during cooking and her pain gets up to 9/10 "on a good day"   Time 6   Period Weeks   Status On-going     PT LONG TERM GOAL #2   Title Pt will have improved 3MWT by 13500ft or > with proper gait mechanics and  without an increase in pain to demonstrate improved overall function and maximize participation in the community.   Baseline 6/6: 47052ft with continued poor gait mechanics   Time 6   Period Weeks   Status On-going     PT LONG TERM GOAL #3   Title Pt will have improved overall BLE strength to 5/5 to maximize gait and functional movements in order to decrease pain and promote return to PLOF.   Baseline 6/6: pt improved in all MMT, but is not 5/5 throughout   Time 6   Period Weeks   Status On-going     PT LONG TERM GOAL #4   Title Pt will report that she has to wear her back brace for 2 hours or < throughout the day to demonstrate improved overall core and functional strength as well as decreased pain in order to maximize overall function at home and in the community.   Baseline 6/6: pt stopped wearing her brace after the initial evaluation   Time 6   Period Weeks   Status Achieved               Plan - 01/28/17 0934    Clinical Impression Statement Pt did well with new exercises.  Reviewed HEP for form with verbal cues given for better form with good follow through.     Rehab Potential Fair   PT Frequency 2x / week   PT Duration 4 weeks   PT Treatment/Interventions ADLs/Self Care Home Management;Cryotherapy;Traction;Gait training;Stair training;Functional mobility training;Therapeutic activities;Therapeutic exercise;Balance training;Neuromuscular re-education;Patient/family education;Manual techniques;Passive range of motion;Dry needling;Taping   PT Next Visit Plan continue quad stretch in prone and HS stretch; continue with functional stretching and core activation; initiate sidelying clams. Continue extension program due to symptom improvement. trial standing bil pulldowns with front foot elevated on step to engage core; SLS, side stepping with band   PT Home Exercise Plan eval: prone press ups, LTR, ab set; 5/22: use of towel roll and lumbar extensions over towel roll; 6/6: prone  quad stretch with sheet   Consulted and Agree with Plan of Care Patient      Patient will benefit from skilled therapeutic intervention in order to improve the following deficits and impairments:  Abnormal gait, Decreased activity tolerance, Decreased balance, Decreased endurance, Decreased mobility, Decreased range of motion, Decreased strength, Difficulty walking, Hypomobility, Increased muscle spasms, Impaired flexibility, Improper body mechanics, Pain  Visit Diagnosis: Chronic bilateral low back pain with bilateral sciatica  Muscle weakness (generalized)  Difficulty in walking, not elsewhere classified     Problem List Patient Active Problem List   Diagnosis Date Noted  . Pneumonia 11/19/2016  . Back pain 11/19/2016  . Constipation 04/27/2012  . GASTROESOPHAGEAL REFLUX DISEASE 06/26/2009  . SARCOIDOSIS, PULMONARY 06/23/2009  . PNEUMONIA 06/23/2009  . CHEST PAIN 06/23/2009   Virgina Organ, PT CLT [phone] 01/28/2017, 9:38 AM  [facility] [address] [place], [place], [address] Phone: [phone]   Fax:  [phone]  Name: [name] MRN: [id number] Date of Birth: [birth year]

## 2017-01-28 NOTE — Patient Instructions (Signed)
Piriformis Stretch (All-Fours)    With right leg crossed in front, slide other leg back, lowering hips until stretch is felt. Repeat _3___ times per set. Do _1___ sets per session. Do __1__ sessions per day.  http://orth.exer.us/292   Copyright  VHI. All rights reserved.  Wall Slide    Keep head, shoulders, and back against wall, with feet out in front and slightly wider than shoulder width. Slowly lower buttocks by sliding down wall until thighs are parallel to floor. Keep back flat. Repeat __10__ times per set. Do _1___ sets per session. Do ____2 sessions per day.  http://orth.exer.us/152   Copyright  VHI. All rights reserved.  Upper / Lower Extremity Extension (All-Fours)    Tighten stomach and raise right leg and opposite arm. Keep trunk rigid. Repeat __10__ times per set. Do __1__ sets per session. Do _2___ sessions per day.  http://orth.exer.us/1118   Copyright  VHI. All rights reserved.

## 2017-01-29 ENCOUNTER — Ambulatory Visit (HOSPITAL_COMMUNITY): Payer: Medicare Other | Admitting: Physical Therapy

## 2017-01-29 ENCOUNTER — Telehealth (HOSPITAL_COMMUNITY): Payer: Self-pay | Admitting: Physical Therapy

## 2017-01-29 NOTE — Telephone Encounter (Signed)
PT did not show for appt.  Called contact number, however no answer and voice mail is not set up.  Lurena NidaAmy B Bhavya Eschete, PTA/CLT [phone]

## 2017-02-03 ENCOUNTER — Ambulatory Visit (HOSPITAL_COMMUNITY): Payer: Medicare Other

## 2017-02-03 ENCOUNTER — Telehealth (HOSPITAL_COMMUNITY): Payer: Self-pay

## 2017-02-03 NOTE — Telephone Encounter (Signed)
No show; spoke with pt regarding missed appointment. She stated that she had a bad morning. PT reminded pt of next appointment time and encouraged her to call and cancel or reschedule if she didn't think she could make it; she verbalized understanding.  Jac CanavanBrooke Grey Schlauch PT, DPT

## 2017-02-05 ENCOUNTER — Encounter (HOSPITAL_COMMUNITY): Payer: Self-pay | Admitting: Physical Therapy

## 2017-02-05 ENCOUNTER — Ambulatory Visit (HOSPITAL_COMMUNITY): Payer: Medicare Other | Admitting: Physical Therapy

## 2017-02-05 ENCOUNTER — Telehealth (HOSPITAL_COMMUNITY): Payer: Self-pay | Admitting: Physical Therapy

## 2017-02-05 NOTE — Therapy (Signed)
[place], [place], [address] Phone: [phone]   Fax:  [phone]  Patient Details  Name: [name] MRN: [id number] Date of Birth: [birth year] Referring Provider:  No ref. provider found  Encounter Date: 02/05/2017   PHYSICAL THERAPY DISCHARGE SUMMARY  Visits from Start of Care: 6 Current functional level related to goals / functional outcomes: Patient has had 3 consecutive no-shows. DC today per clinic policy.   Remaining deficits: Unable to assess    Education / Equipment: None- unable to reach patient via phone  Plan: Patient agrees to discharge.  Patient goals were not met. Patient is being discharged due to not returning since the last visit.  ?????        Deniece Ree PT, DPT [facility] [address] [place], [place], [address] Phone: [phone]   Fax:  [phone]

## 2017-02-05 NOTE — Telephone Encounter (Signed)
Today was patient's 3rd consecutive no-show. Attempted to call both numbers listed in EPIC however one was busy/no answer on the other and voicemail not set up so unable to reach patient.  DC today per policy.  Aleane Wesenberg Nedra HaiPT, DPT [phone]

## 2017-02-21 DIAGNOSIS — G894 Chronic pain syndrome: Secondary | ICD-10-CM | POA: Diagnosis not present

## 2017-02-21 DIAGNOSIS — M1990 Unspecified osteoarthritis, unspecified site: Secondary | ICD-10-CM | POA: Diagnosis not present

## 2017-02-21 DIAGNOSIS — E669 Obesity, unspecified: Secondary | ICD-10-CM | POA: Diagnosis not present

## 2017-02-21 DIAGNOSIS — I10 Essential (primary) hypertension: Secondary | ICD-10-CM | POA: Diagnosis not present

## 2017-02-22 LAB — LIPID PANEL
Cholesterol: 252 — AB (ref 0–200)
HDL: 31 — AB (ref 35–70)
Triglycerides: 407 — AB (ref 40–160)

## 2017-02-22 LAB — TSH: TSH: 5.27 (ref <=5.90)

## 2017-03-31 ENCOUNTER — Other Ambulatory Visit (HOSPITAL_COMMUNITY): Payer: Self-pay | Admitting: Respiratory Therapy

## 2017-03-31 DIAGNOSIS — E785 Hyperlipidemia, unspecified: Secondary | ICD-10-CM | POA: Diagnosis not present

## 2017-03-31 DIAGNOSIS — R0602 Shortness of breath: Secondary | ICD-10-CM

## 2017-03-31 DIAGNOSIS — E039 Hypothyroidism, unspecified: Secondary | ICD-10-CM | POA: Diagnosis not present

## 2017-03-31 DIAGNOSIS — D869 Sarcoidosis, unspecified: Secondary | ICD-10-CM | POA: Diagnosis not present

## 2017-03-31 DIAGNOSIS — I10 Essential (primary) hypertension: Secondary | ICD-10-CM | POA: Diagnosis not present

## 2017-04-04 ENCOUNTER — Ambulatory Visit (HOSPITAL_COMMUNITY): Payer: Medicare Other | Attending: Pulmonary Disease

## 2017-05-21 DIAGNOSIS — E785 Hyperlipidemia, unspecified: Secondary | ICD-10-CM | POA: Diagnosis not present

## 2017-05-21 DIAGNOSIS — I10 Essential (primary) hypertension: Secondary | ICD-10-CM | POA: Diagnosis not present

## 2017-05-21 DIAGNOSIS — F419 Anxiety disorder, unspecified: Secondary | ICD-10-CM | POA: Diagnosis not present

## 2017-05-21 DIAGNOSIS — Z23 Encounter for immunization: Secondary | ICD-10-CM | POA: Diagnosis not present

## 2017-05-21 DIAGNOSIS — E669 Obesity, unspecified: Secondary | ICD-10-CM | POA: Diagnosis not present

## 2017-06-05 DIAGNOSIS — H31002 Unspecified chorioretinal scars, left eye: Secondary | ICD-10-CM | POA: Diagnosis not present

## 2017-06-05 DIAGNOSIS — H21541 Posterior synechiae (iris), right eye: Secondary | ICD-10-CM | POA: Diagnosis not present

## 2017-07-01 DIAGNOSIS — I10 Essential (primary) hypertension: Secondary | ICD-10-CM | POA: Diagnosis not present

## 2017-07-01 DIAGNOSIS — E669 Obesity, unspecified: Secondary | ICD-10-CM | POA: Diagnosis not present

## 2017-07-01 DIAGNOSIS — G894 Chronic pain syndrome: Secondary | ICD-10-CM | POA: Diagnosis not present

## 2017-07-01 DIAGNOSIS — D869 Sarcoidosis, unspecified: Secondary | ICD-10-CM | POA: Diagnosis not present

## 2017-07-28 DIAGNOSIS — F419 Anxiety disorder, unspecified: Secondary | ICD-10-CM | POA: Diagnosis not present

## 2017-07-28 DIAGNOSIS — I10 Essential (primary) hypertension: Secondary | ICD-10-CM | POA: Diagnosis not present

## 2017-07-28 DIAGNOSIS — G894 Chronic pain syndrome: Secondary | ICD-10-CM | POA: Diagnosis not present

## 2017-07-28 DIAGNOSIS — E669 Obesity, unspecified: Secondary | ICD-10-CM | POA: Diagnosis not present

## 2017-08-26 DIAGNOSIS — Z23 Encounter for immunization: Secondary | ICD-10-CM | POA: Diagnosis not present

## 2017-09-08 ENCOUNTER — Other Ambulatory Visit (HOSPITAL_COMMUNITY)
Admission: RE | Admit: 2017-09-08 | Discharge: 2017-09-08 | Disposition: A | Discharge status: Home / Self Care | Payer: Medicare Other | Source: Ambulatory Visit | Attending: Pulmonary Disease | Admitting: Pulmonary Disease

## 2017-09-08 DIAGNOSIS — Z79891 Long term (current) use of opiate analgesic: Secondary | ICD-10-CM | POA: Insufficient documentation

## 2017-09-08 LAB — RAPID URINE DRUG SCREEN, HOSP PERFORMED
AMPHETAMINES: NOT DETECTED
BENZODIAZEPINES: POSITIVE — AB
Barbiturates: NOT DETECTED
COCAINE: NOT DETECTED
OPIATES: POSITIVE — AB
Tetrahydrocannabinol: NOT DETECTED

## 2017-10-02 DIAGNOSIS — F419 Anxiety disorder, unspecified: Secondary | ICD-10-CM | POA: Diagnosis not present

## 2017-10-02 DIAGNOSIS — G894 Chronic pain syndrome: Secondary | ICD-10-CM | POA: Diagnosis not present

## 2017-10-02 DIAGNOSIS — M5431 Sciatica, right side: Secondary | ICD-10-CM | POA: Diagnosis not present

## 2017-10-02 DIAGNOSIS — I10 Essential (primary) hypertension: Secondary | ICD-10-CM | POA: Diagnosis not present

## 2017-10-09 ENCOUNTER — Other Ambulatory Visit (HOSPITAL_COMMUNITY): Payer: Self-pay | Admitting: Pulmonary Disease

## 2017-10-09 ENCOUNTER — Ambulatory Visit (HOSPITAL_COMMUNITY)
Admission: RE | Admit: 2017-10-09 | Discharge: 2017-10-09 | Disposition: A | Discharge status: Home / Self Care | Payer: Medicare Other | Source: Ambulatory Visit | Attending: Pulmonary Disease | Admitting: Pulmonary Disease

## 2017-10-09 DIAGNOSIS — M5431 Sciatica, right side: Secondary | ICD-10-CM

## 2017-10-09 DIAGNOSIS — M5136 Other intervertebral disc degeneration, lumbar region: Secondary | ICD-10-CM | POA: Insufficient documentation

## 2017-10-09 DIAGNOSIS — M545 Low back pain: Secondary | ICD-10-CM | POA: Diagnosis not present

## 2017-10-30 DIAGNOSIS — E669 Obesity, unspecified: Secondary | ICD-10-CM | POA: Diagnosis not present

## 2017-10-30 DIAGNOSIS — I10 Essential (primary) hypertension: Secondary | ICD-10-CM | POA: Diagnosis not present

## 2017-10-30 DIAGNOSIS — M1990 Unspecified osteoarthritis, unspecified site: Secondary | ICD-10-CM | POA: Diagnosis not present

## 2017-10-30 DIAGNOSIS — G894 Chronic pain syndrome: Secondary | ICD-10-CM | POA: Diagnosis not present

## 2017-11-03 ENCOUNTER — Other Ambulatory Visit (HOSPITAL_COMMUNITY): Payer: Self-pay | Admitting: Pulmonary Disease

## 2017-11-03 DIAGNOSIS — Z1231 Encounter for screening mammogram for malignant neoplasm of breast: Secondary | ICD-10-CM

## 2017-11-06 ENCOUNTER — Ambulatory Visit (HOSPITAL_COMMUNITY): Payer: Medicare Other

## 2017-11-10 ENCOUNTER — Encounter (HOSPITAL_COMMUNITY): Payer: Self-pay

## 2017-11-10 ENCOUNTER — Ambulatory Visit (HOSPITAL_COMMUNITY)
Admission: RE | Admit: 2017-11-10 | Discharge: 2017-11-10 | Disposition: A | Discharge status: Home / Self Care | Payer: Medicare Other | Source: Ambulatory Visit | Attending: Pulmonary Disease | Admitting: Pulmonary Disease

## 2017-11-10 DIAGNOSIS — Z1231 Encounter for screening mammogram for malignant neoplasm of breast: Secondary | ICD-10-CM | POA: Diagnosis not present

## 2017-12-02 DIAGNOSIS — G894 Chronic pain syndrome: Secondary | ICD-10-CM | POA: Diagnosis not present

## 2017-12-02 DIAGNOSIS — E669 Obesity, unspecified: Secondary | ICD-10-CM | POA: Diagnosis not present

## 2017-12-02 DIAGNOSIS — I10 Essential (primary) hypertension: Secondary | ICD-10-CM | POA: Diagnosis not present

## 2017-12-02 DIAGNOSIS — D869 Sarcoidosis, unspecified: Secondary | ICD-10-CM | POA: Diagnosis not present

## 2018-01-12 ENCOUNTER — Other Ambulatory Visit (HOSPITAL_COMMUNITY): Payer: Self-pay | Admitting: General Surgery

## 2018-01-22 ENCOUNTER — Ambulatory Visit (HOSPITAL_COMMUNITY): Payer: Medicare Other

## 2018-01-26 ENCOUNTER — Ambulatory Visit (HOSPITAL_COMMUNITY)
Admission: RE | Admit: 2018-01-26 | Discharge: 2018-01-26 | Disposition: A | Discharge status: Home / Self Care | Payer: Medicare Other | Source: Ambulatory Visit | Attending: General Surgery | Admitting: General Surgery

## 2018-01-26 ENCOUNTER — Other Ambulatory Visit: Payer: Self-pay

## 2018-01-26 DIAGNOSIS — J984 Other disorders of lung: Secondary | ICD-10-CM | POA: Insufficient documentation

## 2018-01-26 DIAGNOSIS — K449 Diaphragmatic hernia without obstruction or gangrene: Secondary | ICD-10-CM | POA: Diagnosis not present

## 2018-01-26 DIAGNOSIS — Z01818 Encounter for other preprocedural examination: Secondary | ICD-10-CM | POA: Diagnosis not present

## 2018-01-26 DIAGNOSIS — Z01810 Encounter for preprocedural cardiovascular examination: Secondary | ICD-10-CM | POA: Diagnosis not present

## 2018-01-26 DIAGNOSIS — E669 Obesity, unspecified: Secondary | ICD-10-CM | POA: Diagnosis not present

## 2018-01-29 ENCOUNTER — Encounter: Payer: Self-pay | Admitting: Skilled Nursing Facility1

## 2018-01-29 ENCOUNTER — Encounter: Payer: Medicare Other | Attending: General Surgery | Admitting: Skilled Nursing Facility1

## 2018-01-29 ENCOUNTER — Ambulatory Visit: Payer: Medicare Other | Admitting: Registered"

## 2018-01-29 DIAGNOSIS — Z713 Dietary counseling and surveillance: Secondary | ICD-10-CM | POA: Diagnosis not present

## 2018-01-29 DIAGNOSIS — E669 Obesity, unspecified: Secondary | ICD-10-CM

## 2018-01-29 DIAGNOSIS — Z6839 Body mass index (BMI) 39.0-39.9, adult: Secondary | ICD-10-CM | POA: Insufficient documentation

## 2018-01-29 NOTE — Progress Notes (Signed)
Pre-Op Assessment Visit:  Pre-Operative Sleeve Gastrectomy Surgery  Medical Nutrition Therapy:  Appt start time: 9:12  End time:  10:15  Patient was seen on 01/29/2018 for Pre-Operative Nutrition Assessment. Assessment [facility] coordinator on 01/29/2018.   Pt reports she has used laxitives to lose weight for about 1 year. Pt states she uses her beer to put her to sleep. Pt does report waking up in the middle of the night to eat. Pt may be better served with some SWL visits prior to surgery to ascertain her status as it pertains to her physical and mental health.   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  Pt reports attending support groups (unspecified what type) and dietitian encouraged pt to speak with a mental health professional.  Pt expectation of surgery: to be healthy   Pt expectation of Dietitian: none  Start weight at [facility]: 227.2 BMI: 39.37  24 hr Dietary Recall: First Meal: coffee Snack:  Second Meal: 3-4pm chips and coffee Snack:  Third Meal: pinto bean with neck bones and rice and chicken and corn or green beans  Snack:  Beverages: beer, water  Encouraged to engage in 150 minutes of moderate physical activity including cardiovascular and weight baring weekly  Handouts given during visit include:  . Pre-Op Goals . Bariatric Surgery Protein Shakes During the appointment today the following Pre-Op Goals were reviewed with the patient: . Maintain or lose weight as instructed by your surgeon . Make healthy food choices . Begin to limit portion sizes . Limited concentrated sugars and fried foods . Keep fat/sugar in the single digits per serving on             food labels . Practice CHEWING your food  (aim for 30 chews per bite or until applesauce consistency) . Practice not drinking 15 minutes before, during, and 30 minutes after each meal/snack . Avoid all carbonated beverages   . Avoid/limit caffeinated beverages  . Avoid all sugar-sweetened beverages . Consume 3 meals per day; eat every 3-5 hours . Make a list of non-food related activities . Aim for 64-100 ounces of FLUID daily  . Aim for at least 60-80 grams of PROTEIN daily . Look for a liquid protein source that contain ?15 g protein and ?5 g carbohydrate  (ex: shakes, drinks, shots)  -Follow diet recommendations listed below   Energy and Macronutrient Recomendations: Calories: 1400 Carbohydrate: 158 Protein: 105 Fat: 39  Demonstrated degree of understanding via:  Teach Back  Teaching Method Utilized:  Visual Auditory Hands on  Barriers to learning/adherence to lifestyle change:   Patient to call the Nutrition and Diabetes Education Services to enroll in Pre-Op and Post-Op Nutrition Education when surgery date is scheduled.

## 2018-02-21 ENCOUNTER — Encounter: Payer: Self-pay | Admitting: Pulmonary Disease

## 2018-03-03 DIAGNOSIS — J40 Bronchitis, not specified as acute or chronic: Secondary | ICD-10-CM | POA: Diagnosis not present

## 2018-03-03 DIAGNOSIS — F419 Anxiety disorder, unspecified: Secondary | ICD-10-CM | POA: Diagnosis not present

## 2018-03-03 DIAGNOSIS — D869 Sarcoidosis, unspecified: Secondary | ICD-10-CM | POA: Diagnosis not present

## 2018-03-03 DIAGNOSIS — Z79891 Long term (current) use of opiate analgesic: Secondary | ICD-10-CM | POA: Diagnosis not present

## 2018-03-03 DIAGNOSIS — I10 Essential (primary) hypertension: Secondary | ICD-10-CM | POA: Diagnosis not present

## 2018-05-15 DIAGNOSIS — I10 Essential (primary) hypertension: Secondary | ICD-10-CM | POA: Diagnosis not present

## 2018-05-18 DIAGNOSIS — I10 Essential (primary) hypertension: Secondary | ICD-10-CM | POA: Diagnosis not present

## 2018-05-18 DIAGNOSIS — E669 Obesity, unspecified: Secondary | ICD-10-CM | POA: Diagnosis not present

## 2018-07-02 ENCOUNTER — Ambulatory Visit: Payer: Medicare Other | Admitting: Psychiatry

## 2018-07-07 ENCOUNTER — Ambulatory Visit: Payer: Medicare Other | Admitting: Psychiatry

## 2018-07-17 IMAGING — DX DG LUMBAR SPINE COMPLETE 4+V
5 series · 5 of 5 positions shown · non-contrast
Comparison: Lumbar spine MRI of November 19, 2016 and coronal and
sagittal reconstructed images through the lumbar spine from an
abdominal and pelvic CT scan November 19, 2016.

CLINICAL DATA: Right-sided low back pain radiating down the right
leg for the past 6 weeks. History of sickle cell disease and
sarcoidosis.

EXAM:
LUMBAR SPINE - COMPLETE 4+ VIEW

[l-spine ap]
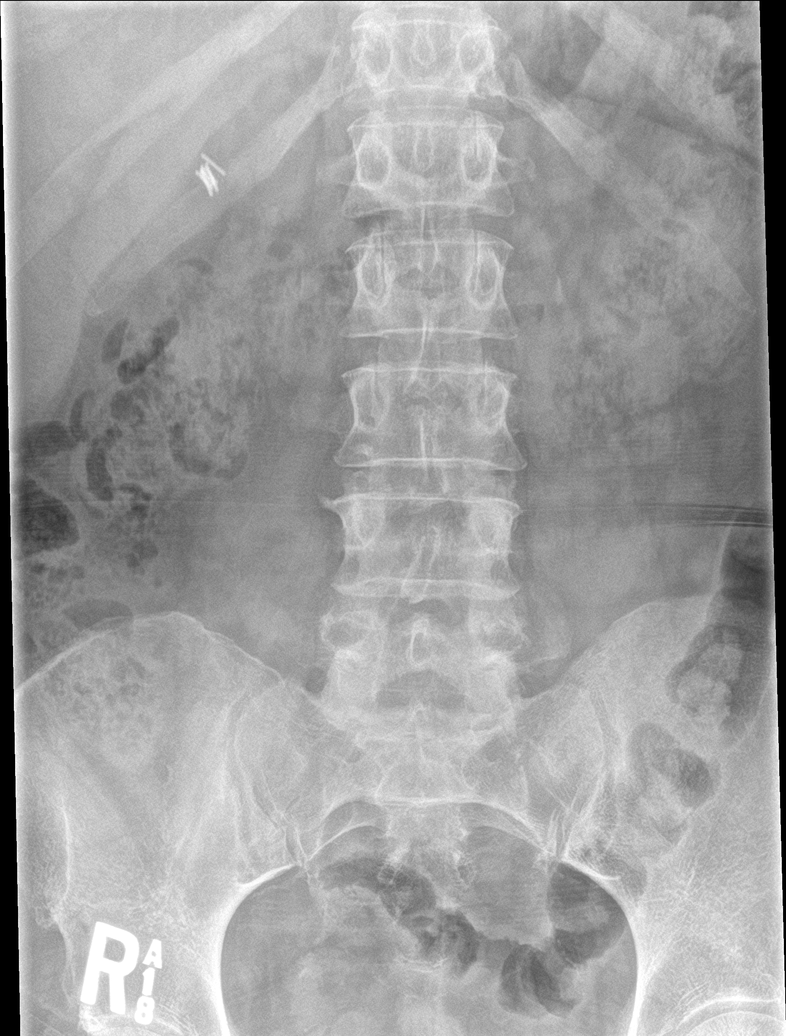

[l-spine obl (1 of 2)]
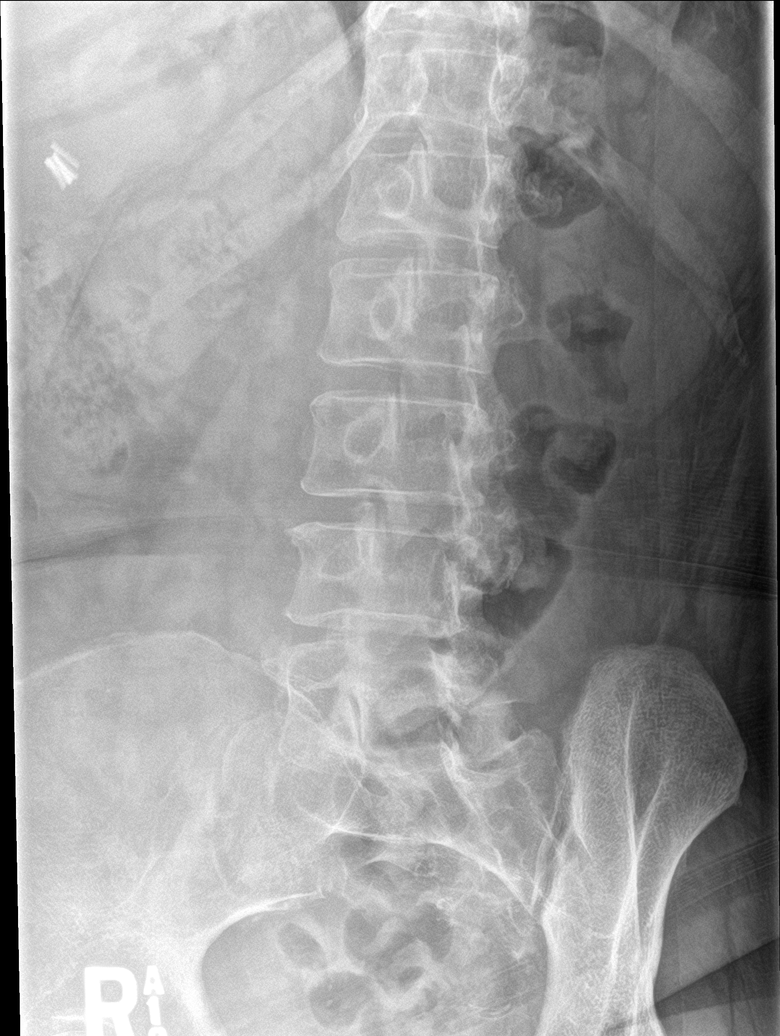

[l-spine obl (2 of 2)]
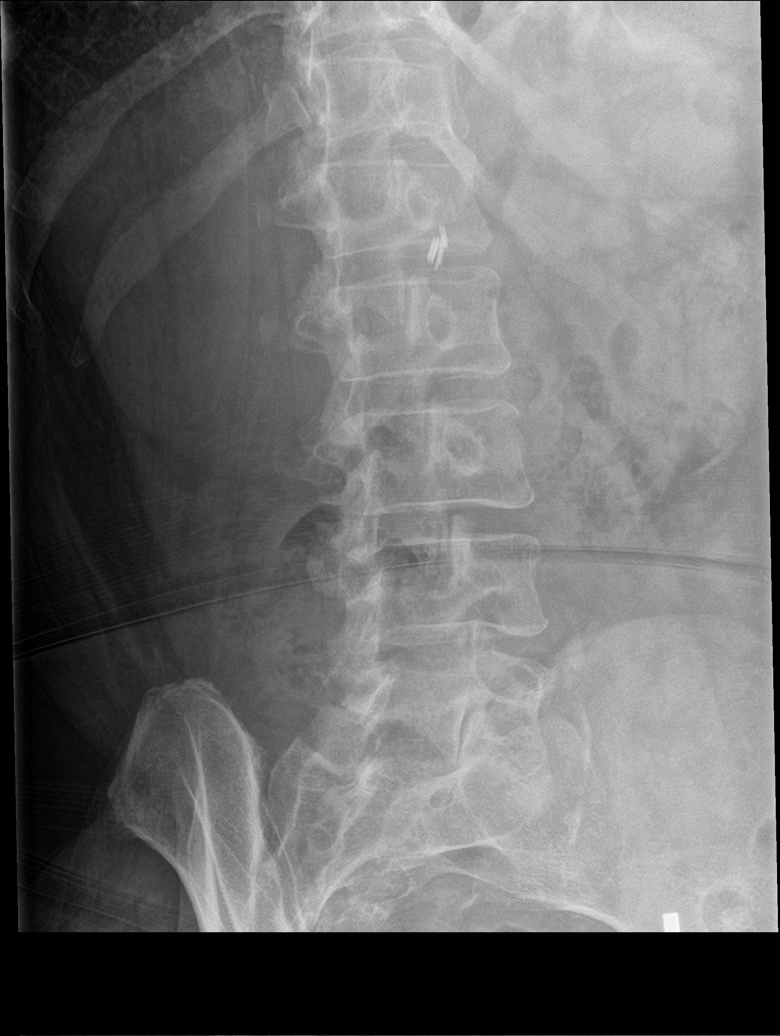

[l-spine lat]
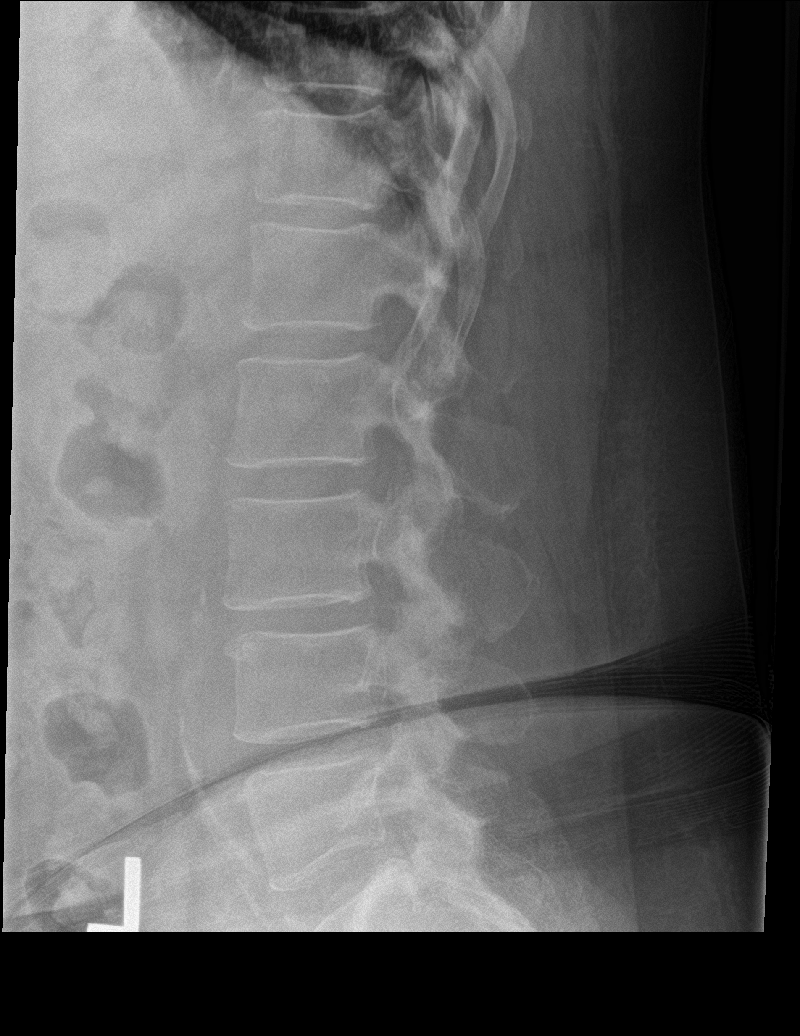

[l-spine spot]
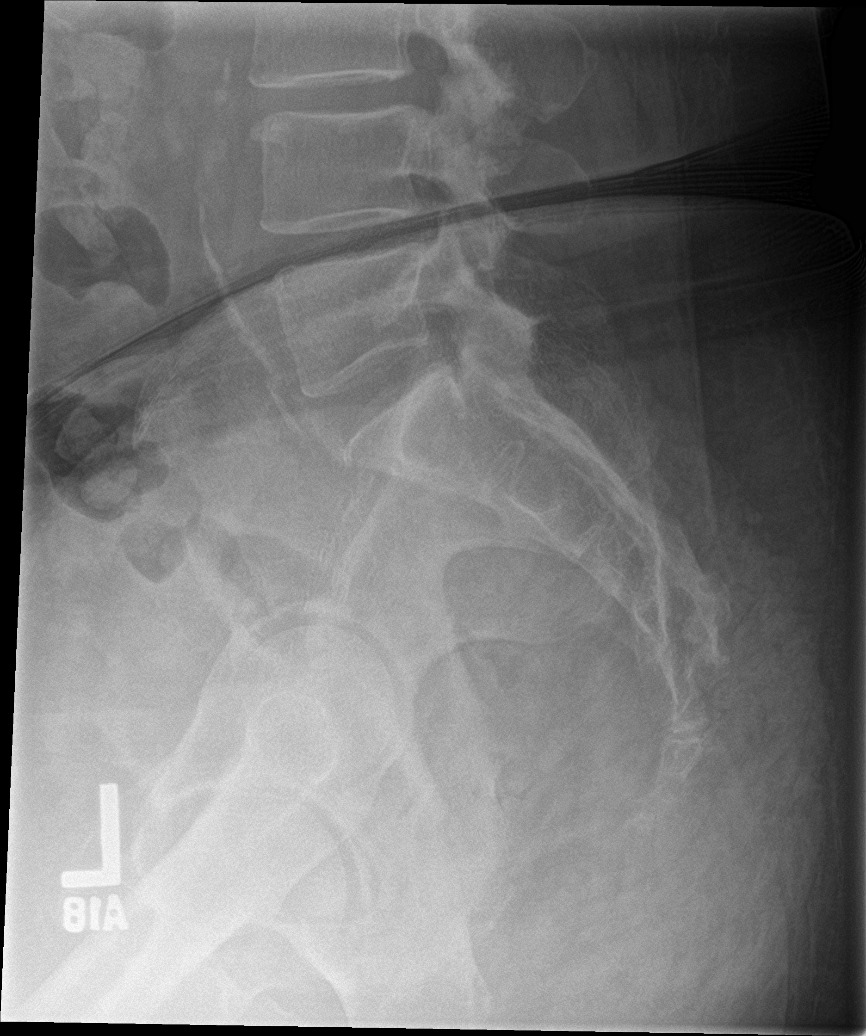

[5 of 5 positions shown; findings below may reference images not displayed]

FINDINGS: The lumbar vertebral bodies are preserved in height. The pedicles
and transverse processes are intact. The disc space heights are
reasonably well-maintained. There is no spondylolisthesis. There is
mild facet joint hypertrophy from L3-4 through L5-S1. There is
calcification in the wall of the abdominal aorta. The observed
portions of the sacrum are normal.
IMPRESSION: There is no acute bony abnormality of the lumbar spine. There is
mild degenerative facet joint hypertrophy from L3 inferiorly. There
is no high-grade disc space narrowing.

Please see the dictation of the November 2016 MRI of the lumbar spine
which revealed significant findings.

## 2018-07-23 DIAGNOSIS — R0602 Shortness of breath: Secondary | ICD-10-CM | POA: Diagnosis not present

## 2018-07-23 DIAGNOSIS — D869 Sarcoidosis, unspecified: Secondary | ICD-10-CM | POA: Diagnosis not present

## 2018-07-23 DIAGNOSIS — E669 Obesity, unspecified: Secondary | ICD-10-CM | POA: Diagnosis not present

## 2018-07-23 DIAGNOSIS — I10 Essential (primary) hypertension: Secondary | ICD-10-CM | POA: Diagnosis not present

## 2018-07-23 DIAGNOSIS — Z23 Encounter for immunization: Secondary | ICD-10-CM | POA: Diagnosis not present

## 2018-08-13 ENCOUNTER — Ambulatory Visit (INDEPENDENT_AMBULATORY_CARE_PROVIDER_SITE_OTHER): Payer: Medicare Other | Admitting: Psychiatry

## 2018-08-13 DIAGNOSIS — F509 Eating disorder, unspecified: Secondary | ICD-10-CM

## 2018-09-01 ENCOUNTER — Ambulatory Visit (INDEPENDENT_AMBULATORY_CARE_PROVIDER_SITE_OTHER): Payer: Medicare Other | Admitting: Psychiatry

## 2018-09-01 DIAGNOSIS — F509 Eating disorder, unspecified: Secondary | ICD-10-CM

## 2018-09-04 ENCOUNTER — Ambulatory Visit (HOSPITAL_COMMUNITY)
Admission: RE | Admit: 2018-09-04 | Discharge: 2018-09-04 | Disposition: A | Discharge status: Home / Self Care | Payer: Medicare Other | Source: Ambulatory Visit | Attending: Pulmonary Disease | Admitting: Pulmonary Disease

## 2018-09-04 ENCOUNTER — Other Ambulatory Visit (HOSPITAL_COMMUNITY): Payer: Self-pay | Admitting: Pulmonary Disease

## 2018-09-04 DIAGNOSIS — R0602 Shortness of breath: Secondary | ICD-10-CM | POA: Diagnosis not present

## 2018-09-07 DIAGNOSIS — E669 Obesity, unspecified: Secondary | ICD-10-CM | POA: Diagnosis not present

## 2018-09-07 DIAGNOSIS — I10 Essential (primary) hypertension: Secondary | ICD-10-CM | POA: Diagnosis not present

## 2018-09-07 DIAGNOSIS — M1990 Unspecified osteoarthritis, unspecified site: Secondary | ICD-10-CM | POA: Diagnosis not present

## 2018-09-07 DIAGNOSIS — D869 Sarcoidosis, unspecified: Secondary | ICD-10-CM | POA: Diagnosis not present

## 2018-12-07 DIAGNOSIS — I10 Essential (primary) hypertension: Secondary | ICD-10-CM | POA: Diagnosis not present

## 2018-12-07 DIAGNOSIS — G894 Chronic pain syndrome: Secondary | ICD-10-CM | POA: Diagnosis not present

## 2018-12-07 DIAGNOSIS — E669 Obesity, unspecified: Secondary | ICD-10-CM | POA: Diagnosis not present

## 2018-12-07 DIAGNOSIS — F419 Anxiety disorder, unspecified: Secondary | ICD-10-CM | POA: Diagnosis not present

## 2018-12-16 DIAGNOSIS — Z720 Tobacco use: Secondary | ICD-10-CM | POA: Diagnosis not present

## 2018-12-16 DIAGNOSIS — E669 Obesity, unspecified: Secondary | ICD-10-CM | POA: Diagnosis not present

## 2018-12-16 DIAGNOSIS — D869 Sarcoidosis, unspecified: Secondary | ICD-10-CM | POA: Diagnosis not present

## 2018-12-16 DIAGNOSIS — E785 Hyperlipidemia, unspecified: Secondary | ICD-10-CM | POA: Diagnosis not present

## 2018-12-25 ENCOUNTER — Ambulatory Visit: Payer: Self-pay | Admitting: General Surgery

## 2019-01-05 ENCOUNTER — Other Ambulatory Visit (HOSPITAL_COMMUNITY): Payer: Self-pay | Admitting: Pulmonary Disease

## 2019-01-05 DIAGNOSIS — Z1231 Encounter for screening mammogram for malignant neoplasm of breast: Secondary | ICD-10-CM

## 2019-01-20 DIAGNOSIS — E7800 Pure hypercholesterolemia, unspecified: Secondary | ICD-10-CM | POA: Diagnosis not present

## 2019-01-20 DIAGNOSIS — D869 Sarcoidosis, unspecified: Secondary | ICD-10-CM | POA: Diagnosis not present

## 2019-01-20 DIAGNOSIS — E669 Obesity, unspecified: Secondary | ICD-10-CM | POA: Diagnosis not present

## 2019-01-20 DIAGNOSIS — K219 Gastro-esophageal reflux disease without esophagitis: Secondary | ICD-10-CM | POA: Diagnosis not present

## 2019-01-20 DIAGNOSIS — Z720 Tobacco use: Secondary | ICD-10-CM | POA: Diagnosis not present

## 2019-01-29 DIAGNOSIS — F419 Anxiety disorder, unspecified: Secondary | ICD-10-CM | POA: Diagnosis not present

## 2019-01-29 DIAGNOSIS — E785 Hyperlipidemia, unspecified: Secondary | ICD-10-CM | POA: Diagnosis not present

## 2019-01-29 DIAGNOSIS — M1990 Unspecified osteoarthritis, unspecified site: Secondary | ICD-10-CM | POA: Diagnosis not present

## 2019-01-29 DIAGNOSIS — I10 Essential (primary) hypertension: Secondary | ICD-10-CM | POA: Diagnosis not present

## 2019-02-02 ENCOUNTER — Encounter: Payer: Medicare Other | Attending: General Surgery | Admitting: Skilled Nursing Facility1

## 2019-02-02 ENCOUNTER — Other Ambulatory Visit: Payer: Self-pay

## 2019-02-02 DIAGNOSIS — Z6841 Body Mass Index (BMI) 40.0 and over, adult: Secondary | ICD-10-CM | POA: Insufficient documentation

## 2019-02-02 DIAGNOSIS — E669 Obesity, unspecified: Secondary | ICD-10-CM

## 2019-02-03 NOTE — Progress Notes (Signed)
Pre-Operative Nutrition Class:  Appt start time: 8676   End time:  1830.  Patient was seen on 02/02/2019 for Pre-Operative Bariatric Surgery Education at [facility].   Surgery date: 02/15/2019 Surgery type: sleeve Start weight at [facility]: 227.2 Weight today: 240.6  Samples given per MNT protocol. Patient educated on appropriate usage: Bariatric Advantage Multivitamin Lot # [id number] Exp: 04/21  Bariatric Advantage Calcium  Lot # [id number] Exp: 11/21/19   Protein20 Lot # [id number] Exp: 12/29/19 The following the learning objectives were met by the patient during this course:  Identify Pre-Op Dietary Goals and will begin 2 weeks pre-operatively  Identify appropriate sources of fluids and proteins   State protein recommendations and appropriate sources pre and post-operatively  Identify Post-Operative Dietary Goals and will follow for 2 weeks post-operatively  Identify appropriate multivitamin and calcium sources  Describe the need for physical activity post-operatively and will follow MD recommendations  State when to call healthcare provider regarding medication questions or post-operative complications  Handouts given during class include:  Pre-Op Bariatric Surgery Diet Handout  Protein Shake Handout  Post-Op Bariatric Surgery Nutrition Handout  BELT Program Information Flyer  Support Group Information Flyer  [facility] Bariatric Supplements Price List  Follow-Up Plan: Patient will follow-up at [facility] 2 weeks post operatively for diet advancement per MD.

## 2019-02-08 NOTE — Patient Instructions (Addendum)
Allison RaringBarbara Chase    Your procedure is scheduled on: 02-15-2019  [facility]  Report to admitting at 1020 AM   YOU NEED TO HAVE A COVID 19 TEST ON 02-11-19 @ 10:15 AM, THIS TEST MUST BE DONE BEFORE SURGERY, COME TO [facility]TR[facility]. ONCE YOUR COVID TEST IS COMPLETED, PLEASE BEGIN THE QUARANTINE INSTRUCTIONS AS OUTLINED IN YOUR HANDOUT.   Call this number if you have problems the morning of surgery [phone]    Remember: BRUSH YOUR TEETH MORNING OF SURGERY AND RINSE YOUR MOUTH OUT, NO CHEWING GUM CANDY OR MINTS.   MORNING OF SURGERY DRINK:   DRINK 1 G2 drink BEFORE YOU LEAVE HOME, DRINK ALL OF THE  G2 DRINK AT ONE TIME.  NO SOLID FOOD AFTER 600 PM THE NIGHT BEFORE YOUR SURGERY. YOU MAY DRINK CLEAR FLUIDS UNTIL 920 AM.  THE G2 DRINK YOU DRINK BEFORE YOU LEAVE HOME WILL BE THE LAST FLUIDS AT 920 AM. .  PAIN IS EXPECTED AFTER SURGERY AND WILL NOT BE COMPLETELY ELIMINATED. AMBULATION AND TYLENOL WILL HELP REDUCE INCISIONAL AND GAS PAIN. MOVEMENT IS KEY!  YOU ARE EXPECTED TO BE OUT OF BED WITHIN 4 HOURS OF ADMISSION TO YOUR PATIENT ROOM.  SITTING IN THE RECLINER THROUGHOUT THE DAY IS IMPORTANT FOR DRINKING FLUIDS AND MOVING GAS THROUGHOUT THE GI TRACT.  COMPRESSION STOCKINGS SHOULD BE WORN [facility] YOUR [facility]OSPITAL STAY UNLESS YOU ARE WALKING.   INCENTIVE SPIROMETER SHOULD BE USED EVERY HOUR WHILE AWAKE TO DECREASE POST-OPERATIVE COMPLICATIONS SUCH AS PNEUMONIA.  WHEN DISCHARGED HOME, IT IS IMPORTANT TO CONTINUE TO WALK EVERY HOUR AND USE THE INCENTIVE SPIROMETER EVERY HOUR.          CLEAR LIQUID DIET   Foods Allowed                                                                     Foods Excluded  Coffee and tea, regular and decaf                             liquids that you cannot  Plain Jell-O in any flavor                                             see through such as: Fruit ices (not with fruit pulp)                                      milk, soups, orange juice  Iced Popsicles                                    All solid food Carbonated beverages, regular and diet  Cranberry, grape and apple juices Sports drinks like Gatorade Lightly seasoned clear broth or consume(fat free) Sugar, honey syrup  Sample Menu Breakfast                                Lunch                                     Supper Cranberry juice                    Beef broth                            Chicken broth Jell-O                                     Grape juice                           Apple juice Coffee or tea                        Jell-O                                      Popsicle                                                Coffee or tea                        Coffee or tea  _____________________________________________________________________   Take these medicines the morning of surgery with A SIP OF WATER: Amlodipine (Norvasc)                                You may not have any metal on your body including hair pins and              piercings    Do not wear jewelry, make-up, lotions, powders or perfumes, deodorant               Do not wear nail polish.  Donot shave 48 hours prior to surgery     Do not bring valuables to the hospital. [facility] IS NOT             RESPONSIBLE   FOR VALUABLES.  Contacts, dentures or bridgework may not be worn into surgery.       _____________________________________________________________________             [facility] - Preparing for Surgery Before surgery, you can play an important role.  Because skin is not sterile, your skin needs to be as free of germs as possible.  You can reduce the number of germs on your skin by washing with CHG (chlorahexidine gluconate) soap before surgery.  CHG is an antiseptic cleaner which kills germs and bonds with the skin to continue killing germs even  after washing. Please DO NOT use  if you have an allergy to CHG or antibacterial soaps.  If your skin becomes reddened/irritated stop using the CHG and inform your nurse when you arrive at Short Stay. Do not shave (including legs and underarms) for at least 48 hours prior to the first CHG shower.  You may shave your face/neck. Please follow these instructions carefully:  1.  Shower with CHG Soap the night before surgery and the  morning of Surgery.  2.  If you choose to wash your hair, wash your hair first as usual with your  normal  shampoo.  3.  After you shampoo, rinse your hair and body thoroughly to remove the  shampoo.                           4.  Use CHG as you would any other liquid soap.  You can apply chg directly  to the skin and wash                       Gently with a scrungie or clean washcloth.  5.  Apply the CHG Soap to your body ONLY FROM THE NECK DOWN.   Do not use on face/ open                           Wound or open sores. Avoid contact with eyes, ears mouth and genitals (private parts).                       Wash face,  Genitals (private parts) with your normal soap.             6.  Wash thoroughly, paying special attention to the area where your surgery  will be performed.  7.  Thoroughly rinse your body with warm water from the neck down.  8.  DO NOT shower/wash with your normal soap after using and rinsing off  the CHG Soap.                9.  Pat yourself dry with a clean towel.            10.  Wear clean pajamas.            11.  Place clean sheets on your bed the night of your first shower and do not  sleep with pets. Day of Surgery : Do not apply any lotions/deodorants the morning of surgery.  Please wear clean clothes to the hospital/surgery center.  FAILURE TO FOLLOW THESE INSTRUCTIONS MAY RESULT IN THE CANCELLATION OF YOUR SURGERY PATIENT SIGNATURE_________________________________  NURSE  SIGNATURE__________________________________  ________________________________________________________________________   [name]  An incentive spirometer is a tool that can help keep your lungs clear and active. This tool measures how well you are filling your lungs with each breath. Taking long deep breaths may help reverse or decrease the chance of developing breathing (pulmonary) problems (especially infection) following:  A long period of time when you are unable to move or be active. BEFORE THE PROCEDURE   If the spirometer includes an indicator to show your best effort, your nurse or respiratory therapist will set it to a desired goal.  If possible, sit up straight or lean slightly forward. Try not to slouch.  Hold the incentive spirometer in an upright position. INSTRUCTIONS FOR USE  1. Sit on the edge  of your bed if possible, or sit up as far as you can in bed or on a chair. 2. Hold the incentive spirometer in an upright position. 3. Breathe out normally. 4. Place the mouthpiece in your mouth and seal your lips tightly around it. 5. Breathe in slowly and as deeply as possible, raising the piston or the ball toward the top of the column. 6. Hold your breath for 3-5 seconds or for as long as possible. Allow the piston or ball to fall to the bottom of the column. 7. Remove the mouthpiece from your mouth and breathe out normally. 8. Rest for a few seconds and repeat Steps 1 through 7 at least 10 times every 1-2 hours when you are awake. Take your time and take a few normal breaths between deep breaths. 9. The spirometer may include an indicator to show your best effort. Use the indicator as a goal to work toward during each repetition. 10. After each set of 10 deep breaths, practice coughing to be sure your lungs are clear. If you have an incision (the cut made at the time of surgery), support your incision when coughing by placing a pillow or rolled up towels firmly  against it. Once you are able to get out of bed, walk around indoors and cough well. You may stop using the incentive spirometer when instructed by your caregiver.  RISKS AND COMPLICATIONS  Take your time so you do not get dizzy or light-headed.  If you are in pain, you may need to take or ask for pain medication before doing incentive spirometry. It is harder to take a deep breath if you are having pain. AFTER USE  Rest and breathe slowly and easily.  It can be helpful to keep track of a log of your progress. Your caregiver can provide you with a simple table to help with this. If you are using the spirometer at home, follow these instructions: SEEK MEDICAL CARE IF:   You are having difficultly using the spirometer.  You have trouble using the spirometer as often as instructed.  Your pain medication is not giving enough relief while using the spirometer.  You develop fever of 100.5 F (38.1 C) or higher. SEEK IMMEDIATE MEDICAL CARE IF:   You cough up bloody sputum that had not been present before.  You develop fever of 102 F (38.9 C) or greater.  You develop worsening pain at or near the incision site. MAKE SURE YOU:   Understand these instructions.  Will watch your condition.  Will get help right away if you are not doing well or get worse. Document Released: 12/09/2006 Document Revised: 10/21/2011 Document Reviewed: 02/09/2007 ExitCare Patient Information 2014 [employer], [place].   ________________________________________________________________________  WHAT IS A BLOOD TRANSFUSION? Blood Transfusion Information  A transfusion is the replacement of blood or some of its parts. Blood is made up of multiple cells which provide different functions.  Red blood cells carry oxygen and are used for blood loss replacement.  White blood cells fight against infection.  Platelets control bleeding.  Plasma helps clot blood.  Other blood products are available for  specialized needs, such as hemophilia or other clotting disorders. BEFORE THE TRANSFUSION  Who gives blood for transfusions?   Healthy volunteers who are fully evaluated to make sure their blood is safe. This is blood bank blood. Transfusion therapy is the safest it has ever been in the practice of medicine. Before blood is taken from a donor, a complete history is  taken to make sure that person has no history of diseases nor engages in risky social behavior (examples are intravenous drug use or sexual activity with multiple partners). The donor's travel history is screened to minimize risk of transmitting infections, such as malaria. The donated blood is tested for signs of infectious diseases, such as HIV and hepatitis. The blood is then tested to be sure it is compatible with you in order to minimize the chance of a transfusion reaction. If you or a relative donates blood, this is often done in anticipation of surgery and is not appropriate for emergency situations. It takes many days to process the donated blood. RISKS AND COMPLICATIONS Although transfusion therapy is very safe and saves many lives, the main dangers of transfusion include:   Getting an infectious disease.  Developing a transfusion reaction. This is an allergic reaction to something in the blood you were given. Every precaution is taken to prevent this. The decision to have a blood transfusion has been considered carefully by your caregiver before blood is given. Blood is not given unless the benefits outweigh the risks. AFTER THE TRANSFUSION  Right after receiving a blood transfusion, you will usually feel much better and more energetic. This is especially true if your red blood cells have gotten low (anemic). The transfusion raises the level of the red blood cells which carry oxygen, and this usually causes an energy increase.  The nurse administering the transfusion will monitor you carefully for complications. HOME CARE  INSTRUCTIONS  No special instructions are needed after a transfusion. You may find your energy is better. Speak with your caregiver about any limitations on activity for underlying diseases you may have. SEEK MEDICAL CARE IF:   Your condition is not improving after your transfusion.  You develop redness or irritation at the intravenous (IV) site. SEEK IMMEDIATE MEDICAL CARE IF:  Any of the following symptoms occur over the next 12 hours:  Shaking chills.  You have a temperature by mouth above 102 F (38.9 C), not controlled by medicine.  Chest, back, or muscle pain.  People around you feel you are not acting correctly or are confused.  Shortness of breath or difficulty breathing.  Dizziness and fainting.  You get a rash or develop hives.  You have a decrease in urine output.  Your urine turns a dark color or changes to pink, red, or brown. Any of the following symptoms occur over the next 10 days:  You have a temperature by mouth above 102 F (38.9 C), not controlled by medicine.  Shortness of breath.  Weakness after normal activity.  The white part of the eye turns yellow (jaundice).  You have a decrease in the amount of urine or are urinating less often.  Your urine turns a dark color or changes to pink, red, or brown. Document Released: 07/26/2000 Document Revised: 10/21/2011 Document Reviewed: 03/14/2008 [facility] Patient Information 2014 [place], [place].  _______________________________________________________________________

## 2019-02-09 ENCOUNTER — Encounter (HOSPITAL_COMMUNITY)
Admission: RE | Admit: 2019-02-09 | Discharge: 2019-02-09 | Disposition: A | Discharge status: Home / Self Care | Payer: Medicare Other | Source: Ambulatory Visit | Attending: General Surgery | Admitting: General Surgery

## 2019-02-09 ENCOUNTER — Other Ambulatory Visit: Payer: Self-pay

## 2019-02-09 ENCOUNTER — Encounter (HOSPITAL_COMMUNITY): Payer: Self-pay

## 2019-02-09 DIAGNOSIS — E669 Obesity, unspecified: Secondary | ICD-10-CM | POA: Diagnosis not present

## 2019-02-09 DIAGNOSIS — Z01818 Encounter for other preprocedural examination: Secondary | ICD-10-CM | POA: Insufficient documentation

## 2019-02-09 DIAGNOSIS — Z1159 Encounter for screening for other viral diseases: Secondary | ICD-10-CM | POA: Diagnosis not present

## 2019-02-09 HISTORY — DX: Essential (primary) hypertension: I10

## 2019-02-09 LAB — CBC WITH DIFFERENTIAL/PLATELET
Abs Immature Granulocytes: 0.03 10*3/uL (ref 0.00–0.07)
Basophils Absolute: 0 10*3/uL (ref 0.0–0.1)
Basophils Relative: 0 %
Eosinophils Absolute: 0.1 10*3/uL (ref 0.0–0.5)
Eosinophils Relative: 2 %
HCT: 37.6 % (ref 36.0–46.0)
Hemoglobin: 11.8 g/dL — ABNORMAL LOW (ref 12.0–15.0)
Immature Granulocytes: 1 %
Lymphocytes Relative: 16 %
Lymphs Abs: 0.8 10*3/uL (ref 0.7–4.0)
MCH: 28 pg (ref 26.0–34.0)
MCHC: 31.4 g/dL (ref 30.0–36.0)
MCV: 89.3 fL (ref 80.0–100.0)
Monocytes Absolute: 0.5 10*3/uL (ref 0.1–1.0)
Monocytes Relative: 9 %
Neutro Abs: 3.7 10*3/uL (ref 1.7–7.7)
Neutrophils Relative %: 72 %
Platelets: 315 10*3/uL (ref 150–400)
RBC: 4.21 MIL/uL (ref 3.87–5.11)
RDW: 13.7 % (ref 11.5–15.5)
WBC: 5.2 10*3/uL (ref 4.0–10.5)
nRBC: 0 % (ref 0.0–0.2)

## 2019-02-09 LAB — COMPREHENSIVE METABOLIC PANEL
ALT: 15 U/L (ref 0–44)
AST: 18 U/L (ref 15–41)
Albumin: 4.1 g/dL (ref 3.5–5.0)
Alkaline Phosphatase: 87 U/L (ref 38–126)
Anion gap: 11 (ref 5–15)
BUN: 8 mg/dL (ref 6–20)
CO2: 25 mmol/L (ref 22–32)
Calcium: 9.4 mg/dL (ref 8.9–10.3)
Chloride: 106 mmol/L (ref 98–111)
Creatinine, Ser: 0.68 mg/dL (ref 0.44–1.00)
GFR calc Af Amer: >60 mL/min (ref >60)
GFR calc non Af Amer: >60 mL/min (ref >60)
Glucose, Bld: 96 mg/dL (ref 70–99)
Potassium: 4.1 mmol/L (ref 3.5–5.1)
Sodium: 142 mmol/L (ref 135–145)
Total Bilirubin: 1 mg/dL (ref 0.3–1.2)
Total Protein: 8.9 g/dL — ABNORMAL HIGH (ref 6.5–8.1)

## 2019-02-09 LAB — ABO/RH: ABO/RH(D): O POS

## 2019-02-09 NOTE — Progress Notes (Signed)
01-29-19  LOV from Pulmonologist, Dr. Sinda Du on chart

## 2019-02-11 ENCOUNTER — Other Ambulatory Visit (HOSPITAL_COMMUNITY)
Admission: RE | Admit: 2019-02-11 | Discharge: 2019-02-11 | Disposition: A | Discharge status: Home / Self Care | Payer: Medicare Other | Source: Ambulatory Visit | Attending: General Surgery | Admitting: General Surgery

## 2019-02-11 DIAGNOSIS — E669 Obesity, unspecified: Secondary | ICD-10-CM | POA: Diagnosis not present

## 2019-02-11 DIAGNOSIS — Z01818 Encounter for other preprocedural examination: Secondary | ICD-10-CM | POA: Diagnosis not present

## 2019-02-11 DIAGNOSIS — Z1159 Encounter for screening for other viral diseases: Secondary | ICD-10-CM | POA: Diagnosis not present

## 2019-02-11 LAB — SARS CORONAVIRUS 2 (TAT 6-24 HRS): SARS Coronavirus 2: NEGATIVE

## 2019-02-14 MED ORDER — BUPIVACAINE LIPOSOME 1.3 % IJ SUSP
20.0000 mL | Freq: Once | INTRAMUSCULAR | Status: DC
  Filled 2019-02-14: qty 20

## 2019-02-15 ENCOUNTER — Inpatient Hospital Stay (HOSPITAL_COMMUNITY): Payer: Medicare Other | Admitting: Physician Assistant

## 2019-02-15 ENCOUNTER — Encounter (HOSPITAL_COMMUNITY)
Admission: RE | Disposition: A | Discharge status: Home / Self Care | Payer: Self-pay | Source: Ambulatory Visit | Attending: General Surgery

## 2019-02-15 ENCOUNTER — Other Ambulatory Visit: Payer: Self-pay

## 2019-02-15 ENCOUNTER — Inpatient Hospital Stay (HOSPITAL_COMMUNITY): Payer: Medicare Other | Admitting: Anesthesiology

## 2019-02-15 ENCOUNTER — Encounter (HOSPITAL_COMMUNITY): Payer: Self-pay | Admitting: *Deleted

## 2019-02-15 ENCOUNTER — Inpatient Hospital Stay (HOSPITAL_COMMUNITY)
Admission: RE | Admit: 2019-02-15 | Discharge: 2019-02-23 | DRG: 620 | Disposition: A | Discharge status: Home / Self Care | Payer: Medicare Other | Source: Ambulatory Visit | Attending: General Surgery | Admitting: General Surgery

## 2019-02-15 DIAGNOSIS — Z79899 Other long term (current) drug therapy: Secondary | ICD-10-CM | POA: Diagnosis not present

## 2019-02-15 DIAGNOSIS — K91870 Postprocedural hematoma of a digestive system organ or structure following a digestive system procedure: Secondary | ICD-10-CM | POA: Diagnosis not present

## 2019-02-15 DIAGNOSIS — Z48815 Encounter for surgical aftercare following surgery on the digestive system: Secondary | ICD-10-CM | POA: Diagnosis not present

## 2019-02-15 DIAGNOSIS — Z888 Allergy status to other drugs, medicaments and biological substances status: Secondary | ICD-10-CM

## 2019-02-15 DIAGNOSIS — Z5331 Laparoscopic surgical procedure converted to open procedure: Secondary | ICD-10-CM | POA: Diagnosis not present

## 2019-02-15 DIAGNOSIS — E669 Obesity, unspecified: Secondary | ICD-10-CM | POA: Diagnosis present

## 2019-02-15 DIAGNOSIS — Z8249 Family history of ischemic heart disease and other diseases of the circulatory system: Secondary | ICD-10-CM

## 2019-02-15 DIAGNOSIS — E7800 Pure hypercholesterolemia, unspecified: Secondary | ICD-10-CM | POA: Diagnosis not present

## 2019-02-15 DIAGNOSIS — K91840 Postprocedural hemorrhage and hematoma of a digestive system organ or structure following a digestive system procedure: Secondary | ICD-10-CM | POA: Diagnosis not present

## 2019-02-15 DIAGNOSIS — K219 Gastro-esophageal reflux disease without esophagitis: Secondary | ICD-10-CM | POA: Diagnosis not present

## 2019-02-15 DIAGNOSIS — Z90710 Acquired absence of both cervix and uterus: Secondary | ICD-10-CM | POA: Diagnosis not present

## 2019-02-15 DIAGNOSIS — K429 Umbilical hernia without obstruction or gangrene: Secondary | ICD-10-CM | POA: Diagnosis not present

## 2019-02-15 DIAGNOSIS — K449 Diaphragmatic hernia without obstruction or gangrene: Secondary | ICD-10-CM | POA: Diagnosis not present

## 2019-02-15 DIAGNOSIS — R0902 Hypoxemia: Secondary | ICD-10-CM | POA: Diagnosis not present

## 2019-02-15 DIAGNOSIS — Z6841 Body Mass Index (BMI) 40.0 and over, adult: Secondary | ICD-10-CM

## 2019-02-15 DIAGNOSIS — R0602 Shortness of breath: Secondary | ICD-10-CM | POA: Diagnosis not present

## 2019-02-15 DIAGNOSIS — Z8701 Personal history of pneumonia (recurrent): Secondary | ICD-10-CM | POA: Diagnosis not present

## 2019-02-15 DIAGNOSIS — Z903 Acquired absence of stomach [part of]: Secondary | ICD-10-CM | POA: Diagnosis not present

## 2019-02-15 DIAGNOSIS — L7632 Postprocedural hematoma of skin and subcutaneous tissue following other procedure: Secondary | ICD-10-CM | POA: Diagnosis not present

## 2019-02-15 DIAGNOSIS — Z87891 Personal history of nicotine dependence: Secondary | ICD-10-CM | POA: Diagnosis not present

## 2019-02-15 DIAGNOSIS — D62 Acute posthemorrhagic anemia: Secondary | ICD-10-CM | POA: Diagnosis not present

## 2019-02-15 DIAGNOSIS — Y838 Other surgical procedures as the cause of abnormal reaction of the patient, or of later complication, without mention of misadventure at the time of the procedure: Secondary | ICD-10-CM | POA: Diagnosis present

## 2019-02-15 DIAGNOSIS — I10 Essential (primary) hypertension: Secondary | ICD-10-CM | POA: Diagnosis not present

## 2019-02-15 DIAGNOSIS — Z9884 Bariatric surgery status: Secondary | ICD-10-CM | POA: Diagnosis not present

## 2019-02-15 DIAGNOSIS — D860 Sarcoidosis of lung: Secondary | ICD-10-CM | POA: Diagnosis present

## 2019-02-15 DIAGNOSIS — Z9049 Acquired absence of other specified parts of digestive tract: Secondary | ICD-10-CM | POA: Diagnosis not present

## 2019-02-15 DIAGNOSIS — R7981 Abnormal blood-gas level: Secondary | ICD-10-CM

## 2019-02-15 DIAGNOSIS — J9690 Respiratory failure, unspecified, unspecified whether with hypoxia or hypercapnia: Secondary | ICD-10-CM | POA: Diagnosis not present

## 2019-02-15 DIAGNOSIS — K922 Gastrointestinal hemorrhage, unspecified: Secondary | ICD-10-CM | POA: Diagnosis not present

## 2019-02-15 DIAGNOSIS — J189 Pneumonia, unspecified organism: Secondary | ICD-10-CM | POA: Diagnosis not present

## 2019-02-15 HISTORY — PX: LAPAROSCOPIC GASTRIC SLEEVE RESECTION: SHX5895

## 2019-02-15 LAB — HEMOGLOBIN AND HEMATOCRIT, BLOOD
HCT: 36.5 % (ref 36.0–46.0)
Hemoglobin: 11.9 g/dL — ABNORMAL LOW (ref 12.0–15.0)

## 2019-02-15 SURGERY — GASTRECTOMY, SLEEVE, LAPAROSCOPIC
Anesthesia: General

## 2019-02-15 MED ORDER — SCOPOLAMINE 1 MG/3DAYS TD PT72
1.0000 | MEDICATED_PATCH | TRANSDERMAL | Status: DC
  Administered 2019-02-15: 1.5 mg via TRANSDERMAL
  Filled 2019-02-15: qty 1

## 2019-02-15 MED ORDER — AMLODIPINE BESYLATE 5 MG PO TABS
5.0000 mg | ORAL_TABLET | Freq: Every day | ORAL | Status: DC
  Administered 2019-02-16: 10:00:00 5 mg via ORAL
  Filled 2019-02-15 (×2): qty 1

## 2019-02-15 MED ORDER — ALPRAZOLAM 0.5 MG PO TABS
0.5000 mg | ORAL_TABLET | Freq: Two times a day (BID) | ORAL | Status: DC | PRN
  Administered 2019-02-16 – 2019-02-21 (×3): 0.5 mg via ORAL
  Filled 2019-02-15 (×3): qty 1

## 2019-02-15 MED ORDER — LACTATED RINGERS IR SOLN
Status: DC | PRN
  Administered 2019-02-15: 1000 mL

## 2019-02-15 MED ORDER — SUGAMMADEX SODIUM 500 MG/5ML IV SOLN
INTRAVENOUS | Status: DC | PRN
  Administered 2019-02-15: 250 mg via INTRAVENOUS

## 2019-02-15 MED ORDER — OXYCODONE HCL 5 MG PO TABS: 5.0000 mg | ORAL_TABLET | Freq: Once | ORAL | Status: DC | PRN

## 2019-02-15 MED ORDER — FENTANYL CITRATE (PF) 100 MCG/2ML IJ SOLN
INTRAMUSCULAR | Status: DC | PRN
  Administered 2019-02-15: 100 ug via INTRAVENOUS

## 2019-02-15 MED ORDER — GABAPENTIN 300 MG PO CAPS
300.0000 mg | ORAL_CAPSULE | ORAL | Status: AC
  Administered 2019-02-15: 11:00:00 300 mg via ORAL
  Filled 2019-02-15: qty 1

## 2019-02-15 MED ORDER — DEXTROSE-NACL 5-0.45 % IV SOLN
INTRAVENOUS | Status: DC
  Administered 2019-02-15 – 2019-02-21 (×12): via INTRAVENOUS

## 2019-02-15 MED ORDER — GABAPENTIN 300 MG PO CAPS: 300.0000 mg | ORAL_CAPSULE | ORAL | Status: DC

## 2019-02-15 MED ORDER — BUPIVACAINE HCL (PF) 0.25 % IJ SOLN
INTRAMUSCULAR | Status: AC
  Filled 2019-02-15: qty 30

## 2019-02-15 MED ORDER — ENSURE MAX PROTEIN PO LIQD
2.0000 [oz_av] | ORAL | Status: DC
  Administered 2019-02-16 – 2019-02-22 (×17): 2 [oz_av] via ORAL

## 2019-02-15 MED ORDER — FAMOTIDINE IN NACL 20-0.9 MG/50ML-% IV SOLN
20.0000 mg | Freq: Two times a day (BID) | INTRAVENOUS | Status: DC
  Administered 2019-02-15 – 2019-02-23 (×15): 20 mg via INTRAVENOUS
  Filled 2019-02-15 (×19): qty 50

## 2019-02-15 MED ORDER — FENTANYL CITRATE (PF) 100 MCG/2ML IJ SOLN
INTRAMUSCULAR | Status: AC
  Filled 2019-02-15: qty 2

## 2019-02-15 MED ORDER — PROMETHAZINE HCL 25 MG/ML IJ SOLN
INTRAMUSCULAR | Status: AC
  Filled 2019-02-15: qty 1

## 2019-02-15 MED ORDER — GABAPENTIN 100 MG PO CAPS
200.0000 mg | ORAL_CAPSULE | Freq: Two times a day (BID) | ORAL | Status: DC
  Administered 2019-02-15 – 2019-02-23 (×12): 200 mg via ORAL
  Filled 2019-02-15 (×14): qty 2

## 2019-02-15 MED ORDER — DEXAMETHASONE SODIUM PHOSPHATE 10 MG/ML IJ SOLN
INTRAMUSCULAR | Status: DC | PRN
  Administered 2019-02-15: 10 mg via INTRAVENOUS

## 2019-02-15 MED ORDER — PROMETHAZINE HCL 25 MG/ML IJ SOLN
6.2500 mg | INTRAMUSCULAR | Status: DC | PRN
  Administered 2019-02-15: 12.5 mg via INTRAVENOUS

## 2019-02-15 MED ORDER — MIDAZOLAM HCL 2 MG/2ML IJ SOLN
INTRAMUSCULAR | Status: AC
  Filled 2019-02-15: qty 2

## 2019-02-15 MED ORDER — SIMETHICONE 80 MG PO CHEW
80.0000 mg | CHEWABLE_TABLET | Freq: Four times a day (QID) | ORAL | Status: DC | PRN
  Administered 2019-02-21: 22:00:00 80 mg via ORAL
  Filled 2019-02-15: qty 1

## 2019-02-15 MED ORDER — MORPHINE SULFATE (PF) 2 MG/ML IV SOLN
1.0000 mg | INTRAVENOUS | Status: DC | PRN
  Administered 2019-02-18: 07:00:00 2 mg via INTRAVENOUS
  Filled 2019-02-15 (×2): qty 1

## 2019-02-15 MED ORDER — KETAMINE HCL 10 MG/ML IJ SOLN
INTRAMUSCULAR | Status: DC | PRN
  Administered 2019-02-15: 30 mg via INTRAVENOUS

## 2019-02-15 MED ORDER — ENSURE PRE-SURGERY PO LIQD
296.0000 mL | Freq: Once | ORAL | Status: DC
  Filled 2019-02-15: qty 296

## 2019-02-15 MED ORDER — BUPIVACAINE HCL 0.25 % IJ SOLN
INTRAMUSCULAR | Status: DC | PRN
  Administered 2019-02-15: 30 mL

## 2019-02-15 MED ORDER — ONDANSETRON HCL 4 MG/2ML IJ SOLN
INTRAMUSCULAR | Status: DC | PRN
  Administered 2019-02-15: 4 mg via INTRAVENOUS

## 2019-02-15 MED ORDER — ENOXAPARIN SODIUM 30 MG/0.3ML ~~LOC~~ SOLN
30.0000 mg | Freq: Two times a day (BID) | SUBCUTANEOUS | Status: DC
  Administered 2019-02-15 – 2019-02-17 (×4): 30 mg via SUBCUTANEOUS
  Filled 2019-02-15 (×4): qty 0.3

## 2019-02-15 MED ORDER — ONDANSETRON HCL 4 MG/2ML IJ SOLN: 4.0000 mg | INTRAMUSCULAR | Status: DC | PRN

## 2019-02-15 MED ORDER — PROPOFOL 10 MG/ML IV BOLUS
INTRAVENOUS | Status: DC | PRN
  Administered 2019-02-15: 200 mg via INTRAVENOUS

## 2019-02-15 MED ORDER — FENTANYL CITRATE (PF) 250 MCG/5ML IJ SOLN
INTRAMUSCULAR | Status: AC
  Filled 2019-02-15: qty 5

## 2019-02-15 MED ORDER — OXYCODONE HCL 5 MG/5ML PO SOLN: 5.0000 mg | Freq: Once | ORAL | Status: DC | PRN

## 2019-02-15 MED ORDER — ACETAMINOPHEN 160 MG/5ML PO SOLN
1000.0000 mg | Freq: Three times a day (TID) | ORAL | Status: DC
  Administered 2019-02-15 – 2019-02-23 (×21): 1000 mg via ORAL
  Filled 2019-02-15 (×23): qty 40.6

## 2019-02-15 MED ORDER — SODIUM CHLORIDE 0.9 % IV SOLN
2.0000 g | INTRAVENOUS | Status: AC
  Administered 2019-02-15: 12:00:00 2 g via INTRAVENOUS
  Filled 2019-02-15: qty 2

## 2019-02-15 MED ORDER — ROCURONIUM BROMIDE 10 MG/ML (PF) SYRINGE
PREFILLED_SYRINGE | INTRAVENOUS | Status: AC
  Filled 2019-02-15: qty 10

## 2019-02-15 MED ORDER — ACETAMINOPHEN 500 MG PO TABS
1000.0000 mg | ORAL_TABLET | ORAL | Status: AC
  Administered 2019-02-15: 11:00:00 1000 mg via ORAL
  Filled 2019-02-15: qty 2

## 2019-02-15 MED ORDER — BUPIVACAINE LIPOSOME 1.3 % IJ SUSP
INTRAMUSCULAR | Status: DC | PRN
  Administered 2019-02-15: 20 mL

## 2019-02-15 MED ORDER — SUGAMMADEX SODIUM 500 MG/5ML IV SOLN
INTRAVENOUS | Status: AC
  Filled 2019-02-15: qty 5

## 2019-02-15 MED ORDER — LIDOCAINE HCL 2 % IJ SOLN
INTRAMUSCULAR | Status: AC
  Filled 2019-02-15: qty 20

## 2019-02-15 MED ORDER — OXYCODONE HCL 5 MG/5ML PO SOLN
5.0000 mg | ORAL | Status: DC | PRN
  Administered 2019-02-15: 10 mg via ORAL
  Administered 2019-02-16 (×2): 5 mg via ORAL
  Administered 2019-02-16 (×2): 10 mg via ORAL
  Administered 2019-02-18 – 2019-02-20 (×6): 5 mg via ORAL
  Administered 2019-02-21 (×2): 10 mg via ORAL
  Administered 2019-02-21: 5 mg via ORAL
  Administered 2019-02-22 – 2019-02-23 (×4): 10 mg via ORAL
  Filled 2019-02-15 (×3): qty 5
  Filled 2019-02-15 (×2): qty 10
  Filled 2019-02-15: qty 5
  Filled 2019-02-15 (×4): qty 10
  Filled 2019-02-15: qty 5
  Filled 2019-02-15 (×2): qty 10
  Filled 2019-02-15: qty 5
  Filled 2019-02-15 (×2): qty 10
  Filled 2019-02-15 (×4): qty 5

## 2019-02-15 MED ORDER — CHLORHEXIDINE GLUCONATE 4 % EX LIQD: 60.0000 mL | Freq: Once | CUTANEOUS | Status: DC

## 2019-02-15 MED ORDER — ROCURONIUM BROMIDE 10 MG/ML (PF) SYRINGE
PREFILLED_SYRINGE | INTRAVENOUS | Status: DC | PRN
  Administered 2019-02-15: 60 mg via INTRAVENOUS

## 2019-02-15 MED ORDER — PROPOFOL 10 MG/ML IV BOLUS
INTRAVENOUS | Status: AC
  Filled 2019-02-15: qty 20

## 2019-02-15 MED ORDER — ONDANSETRON HCL 4 MG/2ML IJ SOLN
INTRAMUSCULAR | Status: AC
  Filled 2019-02-15: qty 2

## 2019-02-15 MED ORDER — 0.9 % SODIUM CHLORIDE (POUR BTL) OPTIME
TOPICAL | Status: DC | PRN
  Administered 2019-02-15: 1000 mL

## 2019-02-15 MED ORDER — FENTANYL CITRATE (PF) 100 MCG/2ML IJ SOLN
25.0000 ug | INTRAMUSCULAR | Status: DC | PRN
  Administered 2019-02-15 (×3): 50 ug via INTRAVENOUS

## 2019-02-15 MED ORDER — HEPARIN SODIUM (PORCINE) 5000 UNIT/ML IJ SOLN
5000.0000 [IU] | INTRAMUSCULAR | Status: AC
  Administered 2019-02-15: 11:00:00 5000 [IU] via SUBCUTANEOUS
  Filled 2019-02-15: qty 1

## 2019-02-15 MED ORDER — ACETAMINOPHEN 500 MG PO TABS
1000.0000 mg | ORAL_TABLET | Freq: Three times a day (TID) | ORAL | Status: DC
  Filled 2019-02-15 (×4): qty 2

## 2019-02-15 MED ORDER — DEXAMETHASONE SODIUM PHOSPHATE 4 MG/ML IJ SOLN: 4.0000 mg | INTRAMUSCULAR | Status: DC

## 2019-02-15 MED ORDER — KETAMINE HCL 10 MG/ML IJ SOLN
INTRAMUSCULAR | Status: AC
  Filled 2019-02-15: qty 1

## 2019-02-15 MED ORDER — HYDRALAZINE HCL 20 MG/ML IJ SOLN: 10.0000 mg | INTRAMUSCULAR | Status: DC | PRN

## 2019-02-15 MED ORDER — APREPITANT 40 MG PO CAPS
40.0000 mg | ORAL_CAPSULE | ORAL | Status: AC
  Administered 2019-02-15: 40 mg via ORAL
  Filled 2019-02-15: qty 1

## 2019-02-15 MED ORDER — DEXAMETHASONE SODIUM PHOSPHATE 10 MG/ML IJ SOLN
INTRAMUSCULAR | Status: AC
  Filled 2019-02-15: qty 1

## 2019-02-15 MED ORDER — MIDAZOLAM HCL 5 MG/5ML IJ SOLN
INTRAMUSCULAR | Status: DC | PRN
  Administered 2019-02-15: 2 mg via INTRAVENOUS

## 2019-02-15 MED ORDER — LACTATED RINGERS IV SOLN
INTRAVENOUS | Status: DC
  Administered 2019-02-15 (×2): via INTRAVENOUS

## 2019-02-15 MED ORDER — ACETAMINOPHEN 500 MG PO TABS: 1000.0000 mg | ORAL_TABLET | ORAL | Status: DC

## 2019-02-15 MED ORDER — LIDOCAINE 20MG/ML (2%) 15 ML SYRINGE OPTIME
INTRAMUSCULAR | Status: DC | PRN
  Administered 2019-02-15: 1.5 mg/kg/h via INTRAVENOUS

## 2019-02-15 SURGICAL SUPPLY — 61 items
APL PRP STRL LF DISP 70% ISPRP (MISCELLANEOUS) ×1
APL SKNCLS STERI-STRIP NONHPOA (GAUZE/BANDAGES/DRESSINGS) ×1
APPLIER CLIP ROT 13.4 12 LRG (CLIP) ×3
APR CLP LRG 13.4X12 ROT 20 MLT (CLIP) ×1
BAG LAPAROSCOPIC 12 15 PORT 16 (BASKET) ×1 IMPLANT
BAG RETRIEVAL 12/15 (BASKET) ×2
BAG RETRIEVAL 12/15MM (BASKET) ×1
BANDAGE ADH SHEER 1  50/CT (GAUZE/BANDAGES/DRESSINGS) ×8 IMPLANT
BENZOIN TINCTURE PRP APPL 2/3 (GAUZE/BANDAGES/DRESSINGS) ×3 IMPLANT
BLADE SURG SZ11 CARB STEEL (BLADE) ×3 IMPLANT
CABLE HIGH FREQUENCY MONO STRZ (ELECTRODE) IMPLANT
CHLORAPREP W/TINT 26 (MISCELLANEOUS) ×3 IMPLANT
CLIP APPLIE ROT 13.4 12 LRG (CLIP) IMPLANT
CLOSURE WOUND 1/2 X4 (GAUZE/BANDAGES/DRESSINGS) ×1
COVER SURGICAL LIGHT HANDLE (MISCELLANEOUS) ×3 IMPLANT
COVER WAND RF STERILE (DRAPES) IMPLANT
DRAPE UTILITY XL STRL (DRAPES) ×6 IMPLANT
ELECT REM PT RETURN 15FT ADLT (MISCELLANEOUS) ×3 IMPLANT
GLOVE BIOGEL PI IND STRL 7.0 (GLOVE) ×1 IMPLANT
GLOVE BIOGEL PI INDICATOR 7.0 (GLOVE) ×2
GLOVE SURG SS PI 7.0 STRL IVOR (GLOVE) ×3 IMPLANT
GOWN STRL REUS W/TWL LRG LVL3 (GOWN DISPOSABLE) ×3 IMPLANT
GOWN STRL REUS W/TWL XL LVL3 (GOWN DISPOSABLE) ×9 IMPLANT
GRASPER SUT TROCAR 14GX15 (MISCELLANEOUS) ×3 IMPLANT
HOVERMATT SINGLE USE (MISCELLANEOUS) ×2 IMPLANT
KIT BASIN OR (CUSTOM PROCEDURE TRAY) ×3 IMPLANT
KIT TURNOVER KIT A (KITS) IMPLANT
MARKER SKIN DUAL TIP RULER LAB (MISCELLANEOUS) ×3 IMPLANT
NDL SPNL 22GX3.5 QUINCKE BK (NEEDLE) ×1 IMPLANT
NEEDLE SPNL 22GX3.5 QUINCKE BK (NEEDLE) ×3 IMPLANT
PACK UNIVERSAL I (CUSTOM PROCEDURE TRAY) ×3 IMPLANT
RELOAD STAPLE 60 3.6 BLU REG (STAPLE) IMPLANT
RELOAD STAPLE 60 3.8 GOLD REG (STAPLE) IMPLANT
RELOAD STAPLE 60 4.1 GRN THCK (STAPLE) IMPLANT
RELOAD STAPLER BLUE 60MM (STAPLE) ×3 IMPLANT
RELOAD STAPLER GOLD 60MM (STAPLE) ×1 IMPLANT
RELOAD STAPLER GREEN 60MM (STAPLE) ×1 IMPLANT
SCISSORS LAP 5X45 EPIX DISP (ENDOMECHANICALS) IMPLANT
SET IRRIG TUBING LAPAROSCOPIC (IRRIGATION / IRRIGATOR) ×3 IMPLANT
SET TUBE SMOKE EVAC HIGH FLOW (TUBING) ×3 IMPLANT
SHEARS HARMONIC ACE PLUS 45CM (MISCELLANEOUS) ×3 IMPLANT
SLEEVE GASTRECTOMY 40FR VISIGI (MISCELLANEOUS) ×3 IMPLANT
SLEEVE XCEL OPT CAN 5 100 (ENDOMECHANICALS) ×6 IMPLANT
SOLUTION ANTI FOG 6CC (MISCELLANEOUS) ×3 IMPLANT
SPONGE LAP 18X18 RF (DISPOSABLE) ×3 IMPLANT
STAPLER ECHELON LONG 60 440 (INSTRUMENTS) ×3 IMPLANT
STAPLER RELOAD BLUE 60MM (STAPLE) ×9
STAPLER RELOAD GOLD 60MM (STAPLE) ×3
STAPLER RELOAD GREEN 60MM (STAPLE) ×3
STRIP CLOSURE SKIN 1/2X4 (GAUZE/BANDAGES/DRESSINGS) ×2 IMPLANT
SUT ETHIBOND 0 36 GRN (SUTURE) IMPLANT
SUT MNCRL AB 4-0 PS2 18 (SUTURE) ×3 IMPLANT
SUT VICRYL 0 TIES 12 18 (SUTURE) ×3 IMPLANT
SYR 20CC LL (SYRINGE) ×3 IMPLANT
SYR 50ML LL SCALE MARK (SYRINGE) ×3 IMPLANT
TOWEL OR 17X26 10 PK STRL BLUE (TOWEL DISPOSABLE) ×3 IMPLANT
TOWEL OR NON WOVEN STRL DISP B (DISPOSABLE) ×3 IMPLANT
TROCAR BLADELESS 15MM (ENDOMECHANICALS) ×3 IMPLANT
TROCAR BLADELESS OPT 5 100 (ENDOMECHANICALS) ×3 IMPLANT
TUBING CONNECTING 10 (TUBING) ×2 IMPLANT
TUBING CONNECTING 10' (TUBING) ×1

## 2019-02-15 NOTE — Anesthesia Postprocedure Evaluation (Signed)
Anesthesia Post Note  Patient: [name]  Procedure(s) Performed: LAPAROSCOPIC GASTRIC SLEEVE RESECTION, UPPER ENDO, ERAS Pathway, Hiatal Hernia Repair (N/A )     Patient location during evaluation: PACU Anesthesia Type: General Level of consciousness: awake and alert Pain management: pain level controlled Vital Signs Assessment: post-procedure vital signs reviewed and stable Respiratory status: spontaneous breathing, nonlabored ventilation and respiratory function stable Cardiovascular status: blood pressure returned to baseline and stable Postop Assessment: no apparent nausea or vomiting Anesthetic complications: no    Last Vitals:  Vitals:   02/15/19 1515 02/15/19 1536  BP: 124/72 134/77  Pulse: 73 72  Resp: 12 14  Temp:  36.8 C  SpO2: 92% 94%    Last Pain:  Vitals:   02/15/19 1515  TempSrc:   PainSc: [name]

## 2019-02-15 NOTE — Transfer of Care (Signed)
Immediate Anesthesia Transfer of Care Note  Patient: [name]  Procedure(s) Performed: LAPAROSCOPIC GASTRIC SLEEVE RESECTION, UPPER ENDO, ERAS Pathway, Hiatal Hernia Repair (N/A )  Patient Location: PACU  Anesthesia Type:General  Level of Consciousness: drowsy, patient cooperative and responds to stimulation  Airway & Oxygen Therapy: Patient Spontanous Breathing and Patient connected to face mask oxygen  Post-op Assessment: Report given to RN and Post -op Vital signs reviewed and stable  Post vital signs: Reviewed and stable  Last Vitals:  Vitals Value Taken Time  BP    Temp    Pulse    Resp    SpO2      Last Pain:  Vitals:   02/15/19 1115  TempSrc: Oral         Complications: No apparent anesthesia complications

## 2019-02-15 NOTE — Op Note (Signed)
Preop Diagnosis: Obesity Class III  Postop Diagnosis: same  Procedure performed: laparoscopic Sleeve Gastrectomy  Assitant: Gaynelle AduEric Wilson  Indications:  The patient is a 58 y.o. year-old morbidly obese female who has been followed in the Bariatric Clinic as an outpatient. This patient was diagnosed with morbid obesity with a BMI of Body mass index is 42.27 kg/m. and significant co-morbidities including hypertension.  The patient was counseled extensively in the Bariatric Outpatient Clinic and after a thorough explanation of the risks and benefits of surgery (including death from complications, bowel leak, infection such as peritonitis and/or sepsis, internal hernia, bleeding, need for blood transfusion, bowel obstruction, organ failure, pulmonary embolus, deep venous thrombosis, wound infection, incisional hernia, skin breakdown, and others entailed on the consent form) and after a compliant diet and exercise program, the patient was scheduled for an elective laparoscopic sleeve gastrectomy.  Description of Operation:  Following informed consent, the patient was taken to the operating room and placed on the operating table in the supine position.  She had previously received prophylactic antibiotics and subcutaneous heparin for DVT prophylaxis in the pre-op holding area.  After induction of general endotracheal anesthesia by the anesthesiologist, the patient underwent placement of sequential compression devices and an oro-gastric tube.  A timeout was confirmed by the surgery and anesthesia teams.  The patient was adequately padded at all pressure points and placed on a footboard to prevent slippage from the OR table during extremes of position during surgery.  She underwent a routine sterile prep and drape of her entire abdomen.    Next, A transverse incision was made under the left subcostal area and a 5mm optical viewing trocar was introduced into the peritoneal cavity. Pneumoperitoneum was applied  with a high flow and low pressure. A laparoscope was inserted to confirm placement. A extraperitoneal block was then placed at the lateral abdominal wall using exparel diluted with marcaine. 5 additional incisions were placed: 1 5mm trocar to the left of the midline. 1 additional 5mm trocar in the left lateral area, 1 12mm trocar in the right mid abdomen, 1 5mm trocar in the right subcostal area, and a Nathanson retractor was placed through a subxiphoid incision.  The UGI showed a small hiatal hernia. Therefore, the pars flaccida was incised with harmonic scalpel. The stomach was reduced but on dissection of the posterior crus there was a visible hernia with small sac. The sac was dissected free and 1 0 ethibond sutures placed in interrupted fashion. A calibration tube was passed to ensure appropriate size of the hiatus.  The fat pad at the GE junction was incised and the gastrodiaphragmatic ligament was divided using the Harmonic scalpel. Next, a hole was created through the lesser omentum along the greater curve of the stomach to enter the lesser sac. The vessels along the greater omentum were  Then ligated and divided using the Harmonic scalpel moving towards the spleen and then short gastric vessels were ligated and divided in the same fashion to fully mobilize the fundus. The left crus was identified to ensure completion of the dissection. Next the antrum was measured and dissection continued inferiorly along the greater curve towards the pylorus and stopped 6cm from the pylorus.   A 40Fr ViSiGi dilator was placed into the esophgaus and along the lesser curve of the stomach and placed on suction. 1 non-reinforced 60mm Green load echelon stapler(s) followed by 1 60mm Gold load echelon stapler(s) followed by 3 60mm blue load echelon stapler(s) were used to make the resection  along the antrum being sure to stay well away from the angularis by angling the jaws of the stapler towards the greater curve and  later completing the resection staying along the [place] and ensuring the fundus was not retained by appropriately retracting it lateral. Air was inserted through the [place] to perform a leak test showing no bubbles and a neutral lie of the stomach.  The assistant then went and performed an upper endoscopy and leak test. No bubbles were seen and the sleeve and antrum distended appropriately. The specimen was then placed in an endocatch bag and removed by the 51mm port. There was bleeding over the staple line and 5 clips were placed for hemostasis. The fascia of the 35mm port was closed with a 0 vicryl by suture passer. Hemostasis was ensured. Pneumoperitoneum was evacuated, all ports were removed and all incisions closed with 4-0 monocryl suture in subcuticular fashion. Steristrips and bandaids were put in place for dressing. The patient awoke from anesthesia and was brought to pacu in stable condition. All counts were correct.  Estimated blood loss: <20ml  Specimens:  Sleeve gastrectomy  Local Anesthesia: 50 ml Exparel:0.5% Marcaine mix  Post-Op Plan:       Pain Management: PO, prn      Antibiotics: Prophylactic      Anticoagulation: Prophylactic, Starting now      Post Op Studies/Consults: Not applicable      Intended Discharge: within 48h      Intended Outpatient Follow-Up: Two Week      Intended Outpatient Studies: Not Applicable      Other: Not Applicable  Images:   Arta Bruce

## 2019-02-15 NOTE — Anesthesia Procedure Notes (Signed)
Procedure Name: Intubation Date/Time: 02/15/2019 12:26 PM Performed by: Glory Buff, CRNA Pre-anesthesia Checklist: Patient identified, Emergency Drugs available, Suction available and Patient being monitored Patient Re-evaluated:Patient Re-evaluated prior to induction Oxygen Delivery Method: Circle system utilized Preoxygenation: Pre-oxygenation with 100% oxygen Induction Type: IV induction Ventilation: Mask ventilation without difficulty Laryngoscope Size: Miller and 3 Grade View: Grade I Tube type: Oral Tube size: 7.0 mm Number of attempts: 1 Airway Equipment and Method: Stylet and Oral airway Placement Confirmation: ETT inserted through vocal cords under direct vision,  positive ETCO2 and breath sounds checked- equal and bilateral Secured at: 21 cm Tube secured with: Tape Dental Injury: Teeth and Oropharynx as per pre-operative assessment

## 2019-02-15 NOTE — Progress Notes (Signed)
Discussed post op day goals with patient including ambulation, IS, diet progression, pain, and nausea control.  BSTOP education provided including BSTOP flyer.  Questions answered. 

## 2019-02-15 NOTE — H&P (Signed)
[name] is an 58 y.o. female.   Chief Complaint: obesity HPI: 58 yo female with long history of obesity as well as multiple comorbidities. She is now ready to proceed with bariatric surgery.  Past Medical History:  Diagnosis Date  . Chest pain   . GERD (gastroesophageal reflux disease)   . Hypercholesterolemia   . Hypertension   . Pneumonia   . Pulmonary sarcoidosis ([place])    Treatment with Prednisone    Past Surgical History:  Procedure Laterality Date  . ABDOMINAL HYSTERECTOMY    . APPENDECTOMY    . CHOLECYSTECTOMY      Family History  Problem Relation Age of Onset  . Hypertension Mother   . Lupus Father   . Colon cancer Neg Hx    Social History:  reports that she quit smoking about 6 years ago. Her smoking use included cigarettes. She has a 6.00 pack-year smoking history. She has never used smokeless tobacco. She reports that she does not drink alcohol or use drugs.  Allergies:  Allergies  Allergen Reactions  . Aspirin Hives    Medications Prior to Admission  Medication Sig Dispense Refill  . ALPRAZolam (XANAX) 0.5 MG tablet Take 0.5 mg by mouth 2 (two) times daily as needed for anxiety or sleep.     Marland Kitchen amLODipine (NORVASC) 5 MG tablet Take 5 mg by mouth daily.    . calcium carbonate (TUMS - DOSED IN MG ELEMENTAL CALCIUM) 500 MG chewable tablet Chew 1 tablet by mouth daily as needed for indigestion or heartburn.    . cyclobenzaprine (FLEXERIL) 10 MG tablet Take 10 mg by mouth 2 (two) times daily as needed for muscle spasms.     . Multiple Vitamins-Minerals (WOMENS 50+ MULTI VITAMIN/MIN PO) Take 1 tablet by mouth daily.    Marland Kitchen HYDROcodone-acetaminophen (NORCO) 10-325 MG tablet Take 1 tablet by mouth 2 (two) times daily as needed for moderate pain.    Marland Kitchen ibuprofen (ADVIL) 800 MG tablet Take 800 mg by mouth 2 (two) times daily as needed for headache or moderate pain.       No results found for this or any previous visit (from the past 48 hour(s)). No results  found.  Review of Systems  Constitutional: Negative for chills and fever.  HENT: Negative for hearing loss.   Eyes: Negative for blurred vision and double vision.  Respiratory: Negative for cough and hemoptysis.   Cardiovascular: Negative for chest pain and palpitations.  Gastrointestinal: Negative for abdominal pain, nausea and vomiting.  Genitourinary: Negative for dysuria and urgency.  Musculoskeletal: Negative for myalgias and neck pain.  Skin: Negative for itching and rash.  Neurological: Negative for dizziness, tingling and headaches.  Endo/Heme/Allergies: Does not bruise/bleed easily.  Psychiatric/Behavioral: Negative for depression and suicidal ideas.    Blood pressure (!) 155/87, pulse 76, temperature 97.9 F (36.6 C), temperature source Oral, resp. rate 18, weight 108.2 kg, SpO2 100 %. Physical Exam  Vitals reviewed. Constitutional: She is oriented to person, place, and time. She appears well-developed and well-nourished.  HENT:  Head: Normocephalic and atraumatic.  Eyes: Pupils are equal, round, and reactive to light. Conjunctivae and EOM are normal.  Neck: Normal range of motion. Neck supple.  Cardiovascular: Normal rate and regular rhythm.  Respiratory: Effort normal and breath sounds normal.  GI: Soft. Bowel sounds are normal. She exhibits no distension. There is no abdominal tenderness.  Musculoskeletal: Normal range of motion.  Neurological: She is alert and oriented to person, place, and time.  Skin: Skin  is warm and dry.  Psychiatric: She has a normal mood and affect. Her behavior is normal.     Assessment/Plan 58 yo female with class III obesity -sleeve gastrectomy -bariatric protocol  Rodman PickleLuke Aaron Bristyn Kulesza, MD 02/15/2019, 11:40 AM

## 2019-02-15 NOTE — Discharge Instructions (Signed)
° ° ° °GASTRIC BYPASS/SLEEVE ° Home Care Instructions ° ° These instructions are to help you care for yourself when you go home. ° °Call: If you have any problems. °• Call [phone] and ask for the surgeon on call °• If you need immediate help, come to the ER at [facility].  °• Tell the ER staff that you are a new post-op gastric bypass or gastric sleeve patient °  °Signs and symptoms to report: • Severe vomiting or nausea °o If you cannot keep down clear liquids for longer than 1 day, call your surgeon  °• Abdominal pain that does not get better after taking your pain medication °• Fever over 100.4° F with chills °• Heart beating over 100 beats a minute °• Shortness of breath at rest °• Chest pain °•  Redness, swelling, drainage, or foul odor at incision (surgical) sites °•  If your incisions open or pull apart °• Swelling or pain in calf (lower leg) °• Diarrhea (Loose bowel movements that happen often), frequent watery, uncontrolled bowel movements °• Constipation, (no bowel movements for 3 days) if this happens: Pick one °o Milk of Magnesia, 2 tablespoons by mouth, 3 times a day for 2 days if needed °o Stop taking Milk of Magnesia once you have a bowel movement °o Call your doctor if constipation continues °Or °o Miralax  (instead of Milk of Magnesia) following the label instructions °o Stop taking Miralax once you have a bowel movement °o Call your doctor if constipation continues °• Anything you think is not normal °  °Normal side effects after surgery: • Unable to sleep at night or unable to focus °• Irritability or moody °• Being tearful (crying) or depressed °These are common complaints, possibly related to your anesthesia medications that put you to sleep, stress of surgery, and change in lifestyle.  This usually goes away a few weeks after surgery.  If these feelings continue, call your primary care doctor. °  °Wound Care: You may have surgical glue, steri-strips, or staples over your incisions after  surgery °• Surgical glue:  Looks like a clear film over your incisions and will wear off a little at a time °• Steri-strips: Strips of tape over your incisions. You may notice a yellowish color on the skin under the steri-strips. This is used to make the   steri-strips stick better. Do not pull the steri-strips off - let them fall off °• Staples: Staples may be removed before you leave the hospital °o If you go home with staples, call [facility], [phone] at for an appointment with your surgeon’s nurse to have staples removed 10 days after surgery. °• Showering: You may shower two (2) days after your surgery unless your surgeon tells you differently °o Wash gently around incisions with warm soapy water, rinse well, and gently pat dry  °o No tub baths until staples are removed, steri-strips fall off or glue is gone.  °  °Medications: • Medications should be liquid or crushed if larger than the size of a dime °• Extended release pills (medication that release a little bit at a time through the day) should NOT be crushed or cut. (examples include XL, ER, DR, SR) °• Depending on the size and number of medications you take, you may need to space (take a few throughout the day)/change the time you take your medications so that you do not over-fill your pouch (smaller stomach) °• Make sure you follow-up with your primary care doctor to   make medication changes needed during rapid weight loss and life-style changes °• If you have diabetes, follow up with the doctor that orders your diabetes medication(s) within one week after surgery and check your blood sugar regularly. °• Do not drive while taking prescription pain medication  °• It is ok to take Tylenol by the bottle instructions with your pain medicine or instead of your pain medicine as needed.  DO NOT TAKE NSAIDS (EXAMPLES OF NSAIDS:  IBUPROFREN/ NAPROXEN)  °Diet:                    First 2 Weeks ° You will see the dietician t about two (2) weeks  after your surgery. The dietician will increase the types of foods you can eat if you are handling liquids well: °• If you have severe vomiting or nausea and cannot keep down clear liquids lasting longer than 1 day, call your surgeon @ ([phone]) °Protein Shake °• Drink at least 2 ounces of shake 5-6 times per day °• Each serving of protein shakes (usually 8 - 12 ounces) should have: °o 15 grams of protein  °o And no more than 5 grams of carbohydrate  °• Goal for protein each day: °o Men = 80 grams per day °o Women = 60 grams per day °• Protein powder may be added to fluids such as non-fat milk or Lactaid milk or unsweetened Soy/Almond milk (limit to 35 grams added protein powder per serving) ° °Hydration °• Slowly increase the amount of water and other clear liquids as tolerated (See Acceptable Fluids) °• Slowly increase the amount of protein shake as tolerated  °•  Sip fluids slowly and throughout the day.  Do not use straws. °• May use sugar substitutes in small amounts (no more than 6 - 8 packets per day; i.e. Splenda) ° °Fluid Goal °• The first goal is to drink at least 8 ounces of protein shake/drink per day (or as directed by the nutritionist); some examples of protein shakes are Syntrax Nectar, Adkins Advantage, EAS Edge HP, and Unjury. See handout from pre-op Bariatric Education Class: °o Slowly increase the amount of protein shake you drink as tolerated °o You may find it easier to slowly sip shakes throughout the day °o It is important to get your proteins in first °• Your fluid goal is to drink 64 - 100 ounces of fluid daily °o It may take a few weeks to build up to this °• 32 oz (or more) should be clear liquids  °And  °• 32 oz (or more) should be full liquids (see below for examples) °• Liquids should not contain sugar, caffeine, or carbonation ° °Clear Liquids: °• Water or Sugar-free flavored water (i.e. Fruit H2O, Propel) °• Decaffeinated coffee or tea (sugar-free) °• Crystal Lite, Wyler’s Lite,  Minute Maid Lite °• Sugar-free Jell-O °• Bouillon or broth °• Sugar-free Popsicle:   *Less than 20 calories each; Limit 1 per day ° °Full Liquids: °Protein Shakes/Drinks + 2 choices per day of other full liquids °• Full liquids must be: °o No More Than 15 grams of Carbs per serving  °o No More Than 3 grams of Fat per serving °• Strained low-fat cream soup (except Cream of Potato or Tomato) °• Non-Fat milk °• Fat-free Lactaid Milk °• Unsweetened Soy Or Unsweetened Almond Milk °• Low Sugar yogurt (Dannon Lite & Fit, Greek yogurt; Oikos Triple Zero; Chobani Simply 100; Yoplait 100 calorie Greek - No Fruit on the Bottom) ° °  °Vitamins   and Minerals • Start 1 day after surgery unless otherwise directed by your surgeon °• 2 Chewable Bariatric Specific Multivitamin / Multimineral Supplement with iron (Example: Bariatric Advantage Multi EA) °• Chewable Calcium with Vitamin D-3 °(Example: 3 Chewable Calcium Plus 600 with Vitamin D-3) °o Take 500 mg three (3) times a day for a total of 1500 mg each day °o Do not take all 3 doses of calcium at one time as it may cause constipation, and you can only absorb 500 mg  at a time  °o Do not mix multivitamins containing iron with calcium supplements; take 2 hours apart °• Menstruating women and those with a history of anemia (a blood disease that causes weakness) may need extra iron °o Talk with your doctor to see if you need more iron °• Do not stop taking or change any vitamins or minerals until you talk to your dietitian or surgeon °• Your Dietitian and/or surgeon must approve all vitamin and mineral supplements °  °Activity and Exercise: Limit your physical activity as instructed by your doctor.  It is important to continue walking at home.  During this time, use these guidelines: °• Do not lift anything greater than ten (10) pounds for at least two (2) weeks °• Do not go back to work or drive until your surgeon says you can °• You may have sex when you feel comfortable  °o It is  VERY important for female patients to use a reliable birth control method; fertility often increases after surgery  °o All hormonal birth control will be ineffective for 30 days after surgery due to medications given during surgery a barrier method must be used. °o Do not get pregnant for at least 18 months °• Start exercising as soon as your doctor tells you that you can °o Make sure your doctor approves any physical activity °• Start with a simple walking program °• Walk 5-15 minutes each day, 7 days per week.  °• Slowly increase until you are walking 30-45 minutes per day °Consider joining our BELT program. [phone] or email belt@[employer] °  °Special Instructions Things to remember: °• Use your CPAP when sleeping if this applies to you ° °• [facility] has two free Bariatric Surgery Support Groups that meet monthly °o The 3rd Thursday of each month, 6 pm, [facility]  °o The 2nd Friday of each month, 11:45 am in the private dining room in the basement of [facility] °• It is very important to keep all follow up appointments with your surgeon, dietitian, primary care physician, and behavioral health practitioner °• Routine follow up schedule with your surgeon include appointments at 2-3 weeks, 6-8 weeks, 6 months, and 1 year at a minimum.  Your surgeon may request to see you more often.   °o After the first year, please follow up with your bariatric surgeon and dietitian at least once a year in order to maintain best weight loss results °[facility]: [phone] °[facility]: [phone] °Bariatric Nurse Coordinator: [phone] °  °   Reviewed and Endorsed  °by [facility] Patient Education Committee, June, 2016 °Edits Approved: Aug, 2018 ° ° ° °

## 2019-02-15 NOTE — Op Note (Signed)
Demaria Deeney [id number] 1961/03/29 02/15/2019  Preoperative diagnosis: severe obesity  Postoperative diagnosis: Same   Procedure: upper endoscopy   Surgeon: Leighton Ruff. Sharna Gabrys M.D., FACS   Anesthesia: Gen.   Indications for procedure: 58 y.o. year old female undergoing Laparoscopic Gastric Sleeve Resection and an EGD was requested to evaluate the new gastric sleeve.   Description of procedure: After we have completed the sleeve resection, I scrubbed out and obtained the Olympus endoscope. I gently placed endoscope in the patient's oropharynx and gently glided it down the esophagus without any difficulty under direct visualization. Once I was in the gastric sleeve, I insufflated the stomach with air. I was able to cannulate and advanced the scope through the gastric sleeve. I was able to cannulate the duodenum with ease. Dr. Kieth Brightly had placed saline in the upper abdomen. Upon further insufflation of the gastric sleeve there was no evidence of bubbles. GE junction located at 36 cm.  Upon further inspection of the gastric sleeve, the mucosa appeared normal. There is no evidence of any mucosal abnormality. The sleeve was widely patent at the angularis. There was no evidence of bleeding. The gastric sleeve was decompressed. The scope was withdrawn. The patient tolerated this portion of the procedure well. Please see Dr Amie Portland operative note for details regarding the laparoscopic gastric sleeve resection.   Leighton Ruff. Redmond Pulling, MD, FACS  General, Bariatric, & Minimally Invasive Surgery  [facility], [place]

## 2019-02-15 NOTE — Progress Notes (Signed)
PHARMACY CONSULT FOR:  Risk Assessment for Post-Discharge VTE Following Bariatric Surgery  Post-Discharge VTE Risk Assessment: This patient's probability of 30-day post-discharge VTE is increased due to the factors marked:   Female    Age >/=60 years    BMI >/=50 kg/m2    CHF    Dyspnea at Rest    Paraplegia    Non-gastric-band surgery    Operation Time >/=3 hr    Return to OR     Length of Stay >/= 3 d   Predicted probability of 30-day post-discharge VTE: 0.16%  Other patient-specific factors to consider:   Recommendation for Discharge: No pharmacologic prophylaxis post-discharge   [name] is a 58 y.o. female who underwent  Laparoscopic gastric sleeve resection.   Case start: 1251 Case end: 1349   Allergies  Allergen Reactions  . Aspirin Hives    Patient Measurements: Weight: 238 lb 9.6 oz (108.2 kg) Body mass index is 42.27 kg/m.  No results for input(s): WBC, HGB, HCT, PLT, APTT, CREATININE, LABCREA, CREATININE, CREAT24HRUR, MG, PHOS, ALBUMIN, PROT, ALBUMIN, AST, ALT, ALKPHOS, BILITOT, BILIDIR, IBILI in the last 72 hours. Estimated Creatinine Clearance: 91.5 mL/min (by C-G formula based on SCr of 0.68 mg/dL).  Past Medical History:  Diagnosis Date  . Chest pain   . GERD (gastroesophageal reflux disease)   . Hypercholesterolemia   . Hypertension   . Pneumonia   . Pulmonary sarcoidosis ([place])    Treatment with Prednisone   Medications Prior to Admission  Medication Sig Dispense Refill Last Dose  . ALPRAZolam (XANAX) 0.5 MG tablet Take 0.5 mg by mouth 2 (two) times daily as needed for anxiety or sleep.    Past Week at Unknown time  . amLODipine (NORVASC) 5 MG tablet Take 5 mg by mouth daily.   02/15/2019 at 0630  . calcium carbonate (TUMS - DOSED IN MG ELEMENTAL CALCIUM) 500 MG chewable tablet Chew 1 tablet by mouth daily as needed for indigestion or heartburn.   Past Week at Unknown time  . cyclobenzaprine (FLEXERIL) 10 MG tablet Take 10 mg by mouth 2  (two) times daily as needed for muscle spasms.    02/14/2019 at Unknown time  . Multiple Vitamins-Minerals (WOMENS 50+ MULTI VITAMIN/MIN PO) Take 1 tablet by mouth daily.   Past Week at Unknown time  . HYDROcodone-acetaminophen (NORCO) 10-325 MG tablet Take 1 tablet by mouth 2 (two) times daily as needed for moderate pain.   More than a month at Unknown time  . ibuprofen (ADVIL) 800 MG tablet Take 800 mg by mouth 2 (two) times daily as needed for headache or moderate pain.    More than a month at Unknown time    Minda Ditto PharmD Pager [phone] 02/15/2019, 2:33 PM

## 2019-02-15 NOTE — Anesthesia Preprocedure Evaluation (Addendum)
Anesthesia Evaluation  Patient identified by MRN, date of birth, ID band Patient awake    Reviewed: Allergy & Precautions, NPO status , Patient's Chart, lab work & pertinent test results  History of Anesthesia Complications Negative for: history of anesthetic complications  Airway Mallampati: II  TM Distance: >3 FB Neck ROM: Full    Dental  (+) Edentulous Upper, Edentulous Lower   Pulmonary former smoker,   Pulmonary sarcoidosis    breath sounds clear to auscultation       Cardiovascular hypertension, Pt. on medications  Rhythm:Regular Rate:Normal     Neuro/Psych negative neurological ROS  negative psych ROS   GI/Hepatic Neg liver ROS, GERD  Medicated and Controlled,  Endo/Other  Morbid obesity  Renal/GU negative Renal ROS     Musculoskeletal negative musculoskeletal ROS (+)   Abdominal   Peds  Hematology negative hematology ROS (+)   Anesthesia Other Findings   Reproductive/Obstetrics  S/p hysterectomy                             Anesthesia Physical Anesthesia Plan  ASA: III  Anesthesia Plan: General   Post-op Pain Management:    Induction: Intravenous  PONV Risk Score and Plan: 4 or greater and Treatment may vary due to age or medical condition, Ondansetron, Scopolamine patch - Pre-op, Midazolam and Dexamethasone  Airway Management Planned: Oral ETT  Additional Equipment: None  Intra-op Plan:   Post-operative Plan: Extubation in OR  Informed Consent: I have reviewed the patients History and Physical, chart, labs and discussed the procedure including the risks, benefits and alternatives for the proposed anesthesia with the patient or authorized representative who has indicated his/her understanding and acceptance.     Dental advisory given  Plan Discussed with: CRNA and Anesthesiologist  Anesthesia Plan Comments:        Anesthesia Quick Evaluation

## 2019-02-16 ENCOUNTER — Encounter (HOSPITAL_COMMUNITY): Payer: Self-pay | Admitting: General Surgery

## 2019-02-16 LAB — CBC WITH DIFFERENTIAL/PLATELET
Abs Immature Granulocytes: 0.04 10*3/uL (ref 0.00–0.07)
Basophils Absolute: 0 10*3/uL (ref 0.0–0.1)
Basophils Relative: 0 %
Eosinophils Absolute: 0 10*3/uL (ref 0.0–0.5)
Eosinophils Relative: 0 %
HCT: 33.8 % — ABNORMAL LOW (ref 36.0–46.0)
Hemoglobin: 10.9 g/dL — ABNORMAL LOW (ref 12.0–15.0)
Immature Granulocytes: 1 %
Lymphocytes Relative: 6 %
Lymphs Abs: 0.5 10*3/uL — ABNORMAL LOW (ref 0.7–4.0)
MCH: 28.5 pg (ref 26.0–34.0)
MCHC: 32.2 g/dL (ref 30.0–36.0)
MCV: 88.3 fL (ref 80.0–100.0)
Monocytes Absolute: 0.3 10*3/uL (ref 0.1–1.0)
Monocytes Relative: 3 %
Neutro Abs: 7.3 10*3/uL (ref 1.7–7.7)
Neutrophils Relative %: 90 %
Platelets: 246 10*3/uL (ref 150–400)
RBC: 3.83 MIL/uL — ABNORMAL LOW (ref 3.87–5.11)
RDW: 13.5 % (ref 11.5–15.5)
WBC: 8.1 10*3/uL (ref 4.0–10.5)
nRBC: 0 % (ref 0.0–0.2)

## 2019-02-16 LAB — COMPREHENSIVE METABOLIC PANEL
ALT: 23 U/L (ref 0–44)
AST: 25 U/L (ref 15–41)
Albumin: 3.8 g/dL (ref 3.5–5.0)
Alkaline Phosphatase: 84 U/L (ref 38–126)
Anion gap: 7 (ref 5–15)
BUN: 8 mg/dL (ref 6–20)
CO2: 24 mmol/L (ref 22–32)
Calcium: 9 mg/dL (ref 8.9–10.3)
Chloride: 105 mmol/L (ref 98–111)
Creatinine, Ser: 0.64 mg/dL (ref 0.44–1.00)
GFR calc Af Amer: >60 mL/min (ref >60)
GFR calc non Af Amer: >60 mL/min (ref >60)
Glucose, Bld: 137 mg/dL — ABNORMAL HIGH (ref 70–99)
Potassium: 4.4 mmol/L (ref 3.5–5.1)
Sodium: 136 mmol/L (ref 135–145)
Total Bilirubin: 0.8 mg/dL (ref 0.3–1.2)
Total Protein: 8.4 g/dL — ABNORMAL HIGH (ref 6.5–8.1)

## 2019-02-16 MED ORDER — SODIUM CHLORIDE 0.9 % IV SOLN
Freq: Once | INTRAVENOUS | Status: AC
  Administered 2019-02-16: 22:00:00 via INTRAVENOUS

## 2019-02-16 MED ORDER — KETOROLAC TROMETHAMINE 30 MG/ML IJ SOLN
30.0000 mg | Freq: Once | INTRAMUSCULAR | Status: AC
  Administered 2019-02-16: 13:00:00 30 mg via INTRAVENOUS
  Filled 2019-02-16: qty 1

## 2019-02-16 NOTE — Progress Notes (Signed)
Pt noted drowsy. Verbally responsive. Pt said "I am ok".  V/S: at 2155  B/P-86/50,HR-61,R-18,T-97.4. Notified to oncall Dr Ralene Ok. Received order to give bolus NS 597ml. And try to check B/p manually . IV bolus NS is infusing now. Will continue to monitor.

## 2019-02-16 NOTE — Progress Notes (Signed)
Pain better controlled by late afternoon, fluid intake has been slow as pain medication made patient drowsy.   Walked in hallway with assist of the staff.  Discussed plan of care with bedside RN. Will continue to monitor for improved oral intake and continued pain control.

## 2019-02-16 NOTE — Progress Notes (Signed)
Patient alert and oriented, Post op day 1.  Provided support and encouragement.  Encouraged pulmonary toilet, ambulation and small sips of liquids.  Patient completed 12 ounces of fluid and 4 ounces of protein.  All questions answered.  Will continue to monitor.

## 2019-02-16 NOTE — Progress Notes (Signed)
Completed IV  NS 544ml bolus  . Checked pt's B/P-120/66.

## 2019-02-16 NOTE — Progress Notes (Signed)
   Progress Note: Metabolic and Bariatric Surgery Service   Chief Complaint/Subjective: Abdominal pain in right side, tolerating liquids  Objective: Vital signs in last 24 hours: Temp:  [97.3 F (36.3 C)-98.4 F (36.9 C)] 98 F (36.7 C) (07/07 0447) Pulse Rate:  [71-93] 75 (07/07 0447) Resp:  [12-18] 18 (07/07 0447) BP: (110-155)/(71-91) 142/91 (07/07 0447) SpO2:  [91 %-100 %] 93 % (07/07 0447) Weight:  [108.2 kg] 108.2 kg (07/06 1115)    Intake/Output from previous day: 07/06 0701 - 07/07 0700 In: 2857.6 [P.O.:480; I.V.:2127.6; IV Piggyback:150] Out: 3125 [Urine:3100; Blood:25] Intake/Output this shift: No intake/output data recorded.  Lungs: nonlabored  Cardiovascular: RRR  Abd: soft, TTP right side, no ecchymosis or erythema  Extremities: no edema  Neuro: AOx4  Lab Results: CBC  Recent Labs    02/15/19 1433 02/16/19 0333  WBC  --  8.1  HGB 11.9* 10.9*  HCT 36.5 33.8*  PLT  --  246   BMET Recent Labs    02/16/19 0333  NA 136  K 4.4  CL 105  CO2 24  GLUCOSE 137*  BUN 8  CREATININE 0.64  CALCIUM 9.0   PT/INR No results for input(s): LABPROT, INR in the last 72 hours. ABG No results for input(s): PHART, HCO3 in the last 72 hours.  Invalid input(s): PCO2, PO2  Studies/Results:  Anti-infectives: Anti-infectives (From admission, onward)   Start     Dose/Rate Route Frequency Ordered Stop   02/15/19 1045  cefoTEtan (CEFOTAN) 2 g in sodium chloride 0.9 % 100 mL IVPB     2 g 200 mL/hr over 30 Minutes Intravenous On call to O.R. 02/15/19 1030 02/15/19 1257      Medications: Scheduled Meds: . acetaminophen  1,000 mg Oral Q8H   Or  . acetaminophen (TYLENOL) oral liquid 160 mg/5 mL  1,000 mg Oral Q8H  . amLODipine  5 mg Oral Daily  . enoxaparin (LOVENOX) injection  30 mg Subcutaneous Q12H  . gabapentin  200 mg Oral Q12H  . ketorolac  30 mg Intravenous Once  . Ensure Max Protein  2 oz Oral Q2H  . scopolamine  1 patch Transdermal On Call to OR    Continuous Infusions: . dextrose 5 % and 0.45% NaCl 100 mL/hr at 02/16/19 0109  . famotidine (PEPCID) IV 20 mg (02/15/19 2121)   PRN Meds:.ALPRAZolam, hydrALAZINE, morphine injection, ondansetron (ZOFRAN) IV, oxyCODONE, simethicone  Assessment/Plan: Patient Active Problem List   Diagnosis Date Noted  . Obesity 02/15/2019  . Pneumonia 11/19/2016  . Back pain 11/19/2016  . Constipation 04/27/2012  . GASTROESOPHAGEAL REFLUX DISEASE 06/26/2009  . SARCOIDOSIS, PULMONARY 06/23/2009  . PNEUMONIA 06/23/2009  . CHEST PAIN 06/23/2009   s/p Procedure(s): LAPAROSCOPIC GASTRIC SLEEVE RESECTION, UPPER ENDO, ERAS Pathway, Hiatal Hernia Repair 02/15/2019 -continue bariatric protocol -toradol 1 time for incisional pain  Disposition:  LOS: 1 day  The patient will be in the hospital for normal postop protocol  Mickeal Skinner, MD [phone] [facility]

## 2019-02-17 ENCOUNTER — Inpatient Hospital Stay (HOSPITAL_COMMUNITY): Payer: Medicare Other | Admitting: Certified Registered Nurse Anesthetist

## 2019-02-17 ENCOUNTER — Inpatient Hospital Stay (HOSPITAL_COMMUNITY): Payer: Medicare Other

## 2019-02-17 ENCOUNTER — Encounter (HOSPITAL_COMMUNITY)
Admission: RE | Disposition: A | Discharge status: Home / Self Care | Payer: Self-pay | Source: Ambulatory Visit | Attending: General Surgery

## 2019-02-17 ENCOUNTER — Encounter (HOSPITAL_COMMUNITY): Payer: Self-pay | Admitting: Radiology

## 2019-02-17 DIAGNOSIS — Z903 Acquired absence of stomach [part of]: Secondary | ICD-10-CM | POA: Diagnosis not present

## 2019-02-17 DIAGNOSIS — K219 Gastro-esophageal reflux disease without esophagitis: Secondary | ICD-10-CM | POA: Diagnosis not present

## 2019-02-17 DIAGNOSIS — L7632 Postprocedural hematoma of skin and subcutaneous tissue following other procedure: Secondary | ICD-10-CM | POA: Diagnosis not present

## 2019-02-17 DIAGNOSIS — J189 Pneumonia, unspecified organism: Secondary | ICD-10-CM | POA: Diagnosis not present

## 2019-02-17 HISTORY — PX: LAPAROSCOPY: SHX197

## 2019-02-17 LAB — CBC
HCT: 24.4 % — ABNORMAL LOW (ref 36.0–46.0)
Hemoglobin: 8 g/dL — ABNORMAL LOW (ref 12.0–15.0)
MCH: 29.9 pg (ref 26.0–34.0)
MCHC: 32.8 g/dL (ref 30.0–36.0)
MCV: 91 fL (ref 80.0–100.0)
Platelets: 206 10*3/uL (ref 150–400)
RBC: 2.68 MIL/uL — ABNORMAL LOW (ref 3.87–5.11)
RDW: 13.9 % (ref 11.5–15.5)
WBC: 20.5 10*3/uL — ABNORMAL HIGH (ref 4.0–10.5)
nRBC: 0.1 % (ref 0.0–0.2)

## 2019-02-17 LAB — CBC WITH DIFFERENTIAL/PLATELET
Abs Immature Granulocytes: 0.23 10*3/uL — ABNORMAL HIGH (ref 0.00–0.07)
Abs Immature Granulocytes: 0.29 10*3/uL — ABNORMAL HIGH (ref 0.00–0.07)
Basophils Absolute: 0 10*3/uL (ref 0.0–0.1)
Basophils Absolute: 0 10*3/uL (ref 0.0–0.1)
Basophils Relative: 0 %
Basophils Relative: 0 %
Eosinophils Absolute: 0 10*3/uL (ref 0.0–0.5)
Eosinophils Absolute: 0 10*3/uL (ref 0.0–0.5)
Eosinophils Relative: 0 %
Eosinophils Relative: 0 %
HCT: 24.6 % — ABNORMAL LOW (ref 36.0–46.0)
HCT: 20 % — ABNORMAL LOW (ref 36.0–46.0)
Hemoglobin: 7.9 g/dL — ABNORMAL LOW (ref 12.0–15.0)
Hemoglobin: 6.3 g/dL — CL (ref 12.0–15.0)
Immature Granulocytes: 2 %
Immature Granulocytes: 2 %
Lymphocytes Relative: 7 %
Lymphocytes Relative: 5 %
Lymphs Abs: 1.1 10*3/uL (ref 0.7–4.0)
Lymphs Abs: 0.8 10*3/uL (ref 0.7–4.0)
MCH: 29.2 pg (ref 26.0–34.0)
MCH: 29.2 pg (ref 26.0–34.0)
MCHC: 32.1 g/dL (ref 30.0–36.0)
MCHC: 31.5 g/dL (ref 30.0–36.0)
MCV: 90.8 fL (ref 80.0–100.0)
MCV: 92.6 fL (ref 80.0–100.0)
Monocytes Absolute: 1.2 10*3/uL — ABNORMAL HIGH (ref 0.1–1.0)
Monocytes Absolute: 1.2 10*3/uL — ABNORMAL HIGH (ref 0.1–1.0)
Monocytes Relative: 8 %
Monocytes Relative: 7 %
Neutro Abs: 12.5 10*3/uL — ABNORMAL HIGH (ref 1.7–7.7)
Neutro Abs: 14.3 10*3/uL — ABNORMAL HIGH (ref 1.7–7.7)
Neutrophils Relative %: 83 %
Neutrophils Relative %: 86 %
Platelets: 215 10*3/uL (ref 150–400)
Platelets: 172 10*3/uL (ref 150–400)
RBC: 2.71 MIL/uL — ABNORMAL LOW (ref 3.87–5.11)
RBC: 2.16 MIL/uL — ABNORMAL LOW (ref 3.87–5.11)
RDW: 14.2 % (ref 11.5–15.5)
RDW: 14.3 % (ref 11.5–15.5)
WBC: 15 10*3/uL — ABNORMAL HIGH (ref 4.0–10.5)
WBC: 16.7 10*3/uL — ABNORMAL HIGH (ref 4.0–10.5)
nRBC: 0.1 % (ref 0.0–0.2)
nRBC: 0.1 % (ref 0.0–0.2)

## 2019-02-17 LAB — MRSA PCR SCREENING: MRSA by PCR: NEGATIVE

## 2019-02-17 LAB — PREPARE RBC (CROSSMATCH)

## 2019-02-17 SURGERY — LAPAROSCOPY, DIAGNOSTIC
Anesthesia: General

## 2019-02-17 MED ORDER — PHENYLEPHRINE 40 MCG/ML (10ML) SYRINGE FOR IV PUSH (FOR BLOOD PRESSURE SUPPORT)
PREFILLED_SYRINGE | INTRAVENOUS | Status: DC | PRN
  Administered 2019-02-17 (×4): 120 ug via INTRAVENOUS
  Administered 2019-02-17 (×4): 80 ug via INTRAVENOUS
  Administered 2019-02-17: 120 ug via INTRAVENOUS

## 2019-02-17 MED ORDER — SODIUM CHLORIDE 0.9% IV SOLUTION
Freq: Once | INTRAVENOUS | Status: AC
  Administered 2019-02-17: 12:00:00 via INTRAVENOUS

## 2019-02-17 MED ORDER — SUCCINYLCHOLINE CHLORIDE 200 MG/10ML IV SOSY
PREFILLED_SYRINGE | INTRAVENOUS | Status: AC
  Filled 2019-02-17: qty 10

## 2019-02-17 MED ORDER — ROCURONIUM BROMIDE 10 MG/ML (PF) SYRINGE
PREFILLED_SYRINGE | INTRAVENOUS | Status: DC | PRN
  Administered 2019-02-17: 20 mg via INTRAVENOUS
  Administered 2019-02-17: 50 mg via INTRAVENOUS

## 2019-02-17 MED ORDER — ONDANSETRON HCL 4 MG/2ML IJ SOLN
INTRAMUSCULAR | Status: DC | PRN
  Administered 2019-02-17: 4 mg via INTRAVENOUS

## 2019-02-17 MED ORDER — ACETAMINOPHEN 10 MG/ML IV SOLN: 1000.0000 mg | Freq: Once | INTRAVENOUS | Status: DC | PRN

## 2019-02-17 MED ORDER — FENTANYL CITRATE (PF) 100 MCG/2ML IJ SOLN
INTRAMUSCULAR | Status: AC
  Filled 2019-02-17: qty 2

## 2019-02-17 MED ORDER — PROPOFOL 10 MG/ML IV BOLUS
INTRAVENOUS | Status: AC
  Filled 2019-02-17: qty 20

## 2019-02-17 MED ORDER — CEFAZOLIN SODIUM-DEXTROSE 2-4 GM/100ML-% IV SOLN
INTRAVENOUS | Status: AC
  Filled 2019-02-17: qty 100

## 2019-02-17 MED ORDER — IOHEXOL 300 MG/ML  SOLN
100.0000 mL | Freq: Once | INTRAMUSCULAR | Status: AC | PRN
  Administered 2019-02-17: 100 mL via INTRAVENOUS

## 2019-02-17 MED ORDER — LIDOCAINE 2% (20 MG/ML) 5 ML SYRINGE
INTRAMUSCULAR | Status: AC
  Filled 2019-02-17: qty 5

## 2019-02-17 MED ORDER — PROPOFOL 10 MG/ML IV BOLUS
INTRAVENOUS | Status: DC | PRN
  Administered 2019-02-17: 180 mg via INTRAVENOUS

## 2019-02-17 MED ORDER — LIDOCAINE 2% (20 MG/ML) 5 ML SYRINGE
INTRAMUSCULAR | Status: DC | PRN
  Administered 2019-02-17: 100 mg via INTRAVENOUS

## 2019-02-17 MED ORDER — LACTATED RINGERS IR SOLN
Status: DC | PRN
  Administered 2019-02-17: 1000 mL

## 2019-02-17 MED ORDER — DEXAMETHASONE SODIUM PHOSPHATE 10 MG/ML IJ SOLN
INTRAMUSCULAR | Status: DC | PRN
  Administered 2019-02-17: 8 mg via INTRAVENOUS

## 2019-02-17 MED ORDER — SUGAMMADEX SODIUM 500 MG/5ML IV SOLN
INTRAVENOUS | Status: DC | PRN
  Administered 2019-02-17: 400 mg via INTRAVENOUS

## 2019-02-17 MED ORDER — CEFAZOLIN SODIUM-DEXTROSE 2-3 GM-%(50ML) IV SOLR
INTRAVENOUS | Status: DC | PRN
  Administered 2019-02-17: 2 g via INTRAVENOUS

## 2019-02-17 MED ORDER — SODIUM CHLORIDE (PF) 0.9 % IJ SOLN
INTRAMUSCULAR | Status: AC
  Filled 2019-02-17: qty 50

## 2019-02-17 MED ORDER — 0.9 % SODIUM CHLORIDE (POUR BTL) OPTIME
TOPICAL | Status: DC | PRN
  Administered 2019-02-17: 3000 mL

## 2019-02-17 MED ORDER — DEXAMETHASONE SODIUM PHOSPHATE 10 MG/ML IJ SOLN
INTRAMUSCULAR | Status: AC
  Filled 2019-02-17: qty 1

## 2019-02-17 MED ORDER — PROMETHAZINE HCL 25 MG/ML IJ SOLN: 6.2500 mg | INTRAMUSCULAR | Status: DC | PRN

## 2019-02-17 MED ORDER — HYDROMORPHONE HCL 1 MG/ML IJ SOLN
INTRAMUSCULAR | Status: AC
  Filled 2019-02-17: qty 1

## 2019-02-17 MED ORDER — IOHEXOL 300 MG/ML  SOLN
15.0000 mL | Freq: Once | INTRAMUSCULAR | Status: AC | PRN
  Administered 2019-02-17: 15 mL via ORAL

## 2019-02-17 MED ORDER — BUPIVACAINE-EPINEPHRINE (PF) 0.5% -1:200000 IJ SOLN
INTRAMUSCULAR | Status: AC
  Filled 2019-02-17: qty 30

## 2019-02-17 MED ORDER — MIDAZOLAM HCL 2 MG/2ML IJ SOLN
INTRAMUSCULAR | Status: DC | PRN
  Administered 2019-02-17: 2 mg via INTRAVENOUS

## 2019-02-17 MED ORDER — ROCURONIUM BROMIDE 10 MG/ML (PF) SYRINGE
PREFILLED_SYRINGE | INTRAVENOUS | Status: AC
  Filled 2019-02-17: qty 10

## 2019-02-17 MED ORDER — BUPIVACAINE HCL (PF) 0.25 % IJ SOLN
INTRAMUSCULAR | Status: AC
  Filled 2019-02-17: qty 30

## 2019-02-17 MED ORDER — ONDANSETRON HCL 4 MG/2ML IJ SOLN
INTRAMUSCULAR | Status: AC
  Filled 2019-02-17: qty 2

## 2019-02-17 MED ORDER — HYDROMORPHONE HCL 1 MG/ML IJ SOLN
0.2500 mg | INTRAMUSCULAR | Status: DC | PRN
  Administered 2019-02-17: 0.25 mg via INTRAVENOUS

## 2019-02-17 MED ORDER — FENTANYL CITRATE (PF) 100 MCG/2ML IJ SOLN
INTRAMUSCULAR | Status: DC | PRN
  Administered 2019-02-17 (×2): 50 ug via INTRAVENOUS

## 2019-02-17 MED ORDER — SODIUM CHLORIDE 0.9 % IV SOLN
INTRAVENOUS | Status: DC | PRN
  Administered 2019-02-17: 17:00:00 via INTRAVENOUS

## 2019-02-17 MED ORDER — BUPIVACAINE LIPOSOME 1.3 % IJ SUSP
20.0000 mL | Freq: Once | INTRAMUSCULAR | Status: DC
  Filled 2019-02-17: qty 20

## 2019-02-17 MED ORDER — MIDAZOLAM HCL 2 MG/2ML IJ SOLN
INTRAMUSCULAR | Status: AC
  Filled 2019-02-17: qty 2

## 2019-02-17 MED ORDER — SUGAMMADEX SODIUM 500 MG/5ML IV SOLN
INTRAVENOUS | Status: AC
  Filled 2019-02-17: qty 5

## 2019-02-17 MED ORDER — LACTATED RINGERS IV SOLN
INTRAVENOUS | Status: DC
  Administered 2019-02-17: 17:00:00 via INTRAVENOUS

## 2019-02-17 MED ORDER — SUCCINYLCHOLINE CHLORIDE 200 MG/10ML IV SOSY
PREFILLED_SYRINGE | INTRAVENOUS | Status: DC | PRN
  Administered 2019-02-17: 180 mg via INTRAVENOUS

## 2019-02-17 MED ORDER — MEPERIDINE HCL 50 MG/ML IJ SOLN: 6.2500 mg | INTRAMUSCULAR | Status: DC | PRN

## 2019-02-17 SURGICAL SUPPLY — 69 items
APL PRP STRL LF DISP 70% ISPRP (MISCELLANEOUS) ×1
APL SKNCLS STERI-STRIP NONHPOA (GAUZE/BANDAGES/DRESSINGS) ×1
APPLIER CLIP ROT 13.4 12 LRG (CLIP)
APR CLP LRG 13.4X12 ROT 20 MLT (CLIP)
BAG LAPAROSCOPIC 12 15 PORT 16 (BASKET) ×1 IMPLANT
BAG RETRIEVAL 12/15 (BASKET) ×2
BAG RETRIEVAL 12/15MM (BASKET) ×1
BANDAGE ADH SHEER 1  50/CT (GAUZE/BANDAGES/DRESSINGS) ×18 IMPLANT
BENZOIN TINCTURE PRP APPL 2/3 (GAUZE/BANDAGES/DRESSINGS) ×3 IMPLANT
BLADE SURG SZ11 CARB STEEL (BLADE) ×3 IMPLANT
CABLE HIGH FREQUENCY MONO STRZ (ELECTRODE) IMPLANT
CHLORAPREP W/TINT 26 (MISCELLANEOUS) ×3 IMPLANT
CLIP APPLIE ROT 13.4 12 LRG (CLIP) IMPLANT
CLOSURE WOUND 1/2 X4 (GAUZE/BANDAGES/DRESSINGS) ×1
COVER SURGICAL LIGHT HANDLE (MISCELLANEOUS) ×3 IMPLANT
COVER WAND RF STERILE (DRAPES) IMPLANT
DRAPE UTILITY XL STRL (DRAPES) ×6 IMPLANT
DRSG OPSITE POSTOP 4X8 (GAUZE/BANDAGES/DRESSINGS) ×2 IMPLANT
DRSG TEGADERM 2-3/8X2-3/4 SM (GAUZE/BANDAGES/DRESSINGS) ×12 IMPLANT
ELECT REM PT RETURN 15FT ADLT (MISCELLANEOUS) ×3 IMPLANT
GAUZE SPONGE 2X2 8PLY STRL LF (GAUZE/BANDAGES/DRESSINGS) IMPLANT
GLOVE BIOGEL PI IND STRL 7.0 (GLOVE) ×1 IMPLANT
GLOVE BIOGEL PI INDICATOR 7.0 (GLOVE) ×2
GLOVE SURG SS PI 7.0 STRL IVOR (GLOVE) ×3 IMPLANT
GOWN STRL REUS W/TWL LRG LVL3 (GOWN DISPOSABLE) ×3 IMPLANT
GOWN STRL REUS W/TWL XL LVL3 (GOWN DISPOSABLE) ×9 IMPLANT
GRASPER SUT TROCAR 14GX15 (MISCELLANEOUS) ×3 IMPLANT
HOVERMATT SINGLE USE (MISCELLANEOUS) IMPLANT
KIT BASIN OR (CUSTOM PROCEDURE TRAY) ×3 IMPLANT
KIT TURNOVER KIT A (KITS) IMPLANT
MARKER SKIN DUAL TIP RULER LAB (MISCELLANEOUS) ×3 IMPLANT
NDL SPNL 22GX3.5 QUINCKE BK (NEEDLE) ×1 IMPLANT
NEEDLE SPNL 22GX3.5 QUINCKE BK (NEEDLE) ×3 IMPLANT
PACK UNIVERSAL I (CUSTOM PROCEDURE TRAY) ×3 IMPLANT
RELOAD STAPLE 60 3.6 BLU REG (STAPLE) IMPLANT
RELOAD STAPLE 60 3.8 GOLD REG (STAPLE) IMPLANT
RELOAD STAPLE 60 4.1 GRN THCK (STAPLE) IMPLANT
RELOAD STAPLER BLUE 60MM (STAPLE) IMPLANT
RELOAD STAPLER GOLD 60MM (STAPLE) IMPLANT
RELOAD STAPLER GREEN 60MM (STAPLE) IMPLANT
SCISSORS LAP 5X45 EPIX DISP (ENDOMECHANICALS) IMPLANT
SET IRRIG TUBING LAPAROSCOPIC (IRRIGATION / IRRIGATOR) ×3 IMPLANT
SET TUBE SMOKE EVAC HIGH FLOW (TUBING) ×3 IMPLANT
SHEARS HARMONIC ACE PLUS 45CM (MISCELLANEOUS) ×3 IMPLANT
SLEEVE GASTRECTOMY 40FR VISIGI (MISCELLANEOUS) ×3 IMPLANT
SLEEVE XCEL OPT CAN 5 100 (ENDOMECHANICALS) ×6 IMPLANT
SOLUTION ANTI FOG 6CC (MISCELLANEOUS) ×3 IMPLANT
SPONGE GAUZE 2X2 STER 10/PKG (GAUZE/BANDAGES/DRESSINGS) ×2
SPONGE LAP 18X18 RF (DISPOSABLE) ×3 IMPLANT
STAPLER ECHELON LONG 60 440 (INSTRUMENTS) ×3 IMPLANT
STAPLER RELOAD BLUE 60MM (STAPLE)
STAPLER RELOAD GOLD 60MM (STAPLE)
STAPLER RELOAD GREEN 60MM (STAPLE)
STAPLER VISISTAT 35W (STAPLE) ×2 IMPLANT
STRIP CLOSURE SKIN 1/2X4 (GAUZE/BANDAGES/DRESSINGS) ×2 IMPLANT
SUT ETHIBOND 0 36 GRN (SUTURE) IMPLANT
SUT MNCRL AB 4-0 PS2 18 (SUTURE) ×3 IMPLANT
SUT PDS AB 0 CTX 36 PDP370T (SUTURE) ×4 IMPLANT
SUT SILK 3 0 12 30 (SUTURE) ×2 IMPLANT
SUT SILK 3 0 SH 30 (SUTURE) ×4 IMPLANT
SUT VICRYL 0 TIES 12 18 (SUTURE) ×3 IMPLANT
SYR 20CC LL (SYRINGE) ×3 IMPLANT
SYR 50ML LL SCALE MARK (SYRINGE) ×3 IMPLANT
TOWEL OR 17X26 10 PK STRL BLUE (TOWEL DISPOSABLE) ×3 IMPLANT
TOWEL OR NON WOVEN STRL DISP B (DISPOSABLE) ×3 IMPLANT
TROCAR BLADELESS 15MM (ENDOMECHANICALS) ×3 IMPLANT
TROCAR BLADELESS OPT 5 100 (ENDOMECHANICALS) ×3 IMPLANT
TUBING CONNECTING 10 (TUBING) ×2 IMPLANT
TUBING CONNECTING 10' (TUBING) ×1

## 2019-02-17 NOTE — Anesthesia Preprocedure Evaluation (Addendum)
Anesthesia Evaluation  Patient identified by MRN, date of birth, ID band Patient awake    Reviewed: Allergy & Precautions, NPO status , Patient's Chart, lab work & pertinent test results  History of Anesthesia Complications Negative for: history of anesthetic complications  Airway Mallampati: II       Dental  (+) Edentulous Upper, Edentulous Lower   Pulmonary former smoker,   Pulmonary sarcoidosis    Pulmonary exam normal breath sounds clear to auscultation       Cardiovascular hypertension, Pt. on medications  Rhythm:Regular Rate:Tachycardia     Neuro/Psych negative neurological ROS  negative psych ROS   GI/Hepatic Neg liver ROS, GERD  Medicated and Controlled,  Endo/Other  Morbid obesity  Renal/GU negative Renal ROS     Musculoskeletal negative musculoskeletal ROS (+)   Abdominal (+) + obese,   Peds  Hematology negative hematology ROS (+)   Anesthesia Other Findings   Reproductive/Obstetrics  S/p hysterectomy                             Anesthesia Physical  Anesthesia Plan  ASA: III  Anesthesia Plan: General   Post-op Pain Management:    Induction: Intravenous  PONV Risk Score and Plan: 4 or greater and Treatment may vary due to age or medical condition, Ondansetron, Scopolamine patch - Pre-op, Midazolam and Dexamethasone  Airway Management Planned: Oral ETT  Additional Equipment: None  Intra-op Plan:   Post-operative Plan: Extubation in OR  Informed Consent: I have reviewed the patients History and Physical, chart, labs and discussed the procedure including the risks, benefits and alternatives for the proposed anesthesia with the patient or authorized representative who has indicated his/her understanding and acceptance.     Dental advisory given  Plan Discussed with: CRNA  Anesthesia Plan Comments:        Anesthesia Quick Evaluation

## 2019-02-17 NOTE — Anesthesia Procedure Notes (Signed)
Procedure Name: Intubation Date/Time: 02/17/2019 5:21 PM Performed by: Niel Hummer, CRNA Pre-anesthesia Checklist: Patient being monitored, Suction available, Emergency Drugs available and Patient identified Patient Re-evaluated:Patient Re-evaluated prior to induction Oxygen Delivery Method: Circle system utilized Preoxygenation: Pre-oxygenation with 100% oxygen Induction Type: IV induction and Rapid sequence Laryngoscope Size: Mac and 4 Grade View: Grade I Tube type: Oral Tube size: 7.0 mm Number of attempts: 1 Airway Equipment and Method: Stylet Placement Confirmation: ETT inserted through vocal cords under direct vision,  positive ETCO2 and breath sounds checked- equal and bilateral Secured at: 23 cm Tube secured with: Tape Dental Injury: Teeth and Oropharynx as per pre-operative assessment

## 2019-02-17 NOTE — Transfer of Care (Signed)
Immediate Anesthesia Transfer of Care Note  Patient: [name]  Procedure(s) Performed: LAPAROSCOPY DIAGNOSTIC CONVERTED TO OPEN, EVACUATION OF HEMATOMA AND LIGATION OF VESSEL (N/A )  Patient Location: PACU  Anesthesia Type:General  Level of Consciousness: awake, alert  and patient cooperative  Airway & Oxygen Therapy: Patient Spontanous Breathing and Patient connected to face mask oxygen  Post-op Assessment: Report given to RN and Post -op Vital signs reviewed and stable  Post vital signs: Reviewed and stable  Last Vitals:  Vitals Value Taken Time  BP 148/72 02/17/19 1922  Temp    Pulse 93 02/17/19 1924  Resp 20 02/17/19 1924  SpO2 97 % 02/17/19 1924  Vitals shown include unvalidated device data.  Last Pain:  Vitals:   02/17/19 1659  TempSrc:   PainSc: 8       Patients Stated Pain Goal: 2 (32/67/12 4580)  Complications: No apparent anesthesia complications

## 2019-02-17 NOTE — Progress Notes (Signed)
One liter of normal saline given over two hours per verbal order to [facility] from Dr Kieth Brightly. Donne Hazel, RN

## 2019-02-17 NOTE — Op Note (Signed)
Preoperative diagnosis: acute hemorrhage  Postoperative diagnosis: same   Procedure: diagnostic laparoscopy converted to exploratory laparotomy, evacuation of hematoma, ligation of vessels  Surgeon: Gurney Maxin, M.D.  Asst: Adolph Pollack  Anesthesia: general  Indications for procedure: [name] is a 58 y.o. year old female with symptoms of hypotension and anemia after sleeve gastrectomy. Work up revealed a large amount of fluid in the abdomen concerning for bleeding. The findings and plan to re-operate were discussed with the patient. She showed understanding and agreed to proceed.  Description of procedure: The patient was brought into the operative suite. Anesthesia was administered with General endotracheal anesthesia. WHO checklist was applied. The patient was then placed in supine position. The area was prepped and draped in the usual sterile fashion.  Next, the previous left subcostal trocar site was opened and 5 mm trocar used to gain access to the peritoneal cavity. Pneumoperitoneum was started and laparoscope was reinserted to confirm position. There was a large amount of blood in the upper abdomen and limited operating space. The periumbilical trocar site was reopened and a  5 mm trocar was inserted. Attempt was made to suction the blood in the upper abdomen but no domain was able to be kept. After trouble shooting the insufflation, it appears that there was no space to continue safely laparoscopically. Therefore, an upper midline incision was made. Cautery was used to dissect through the fascia. A large amount of clott and blood was seen and removed with suction. There was additional blood in the lower abdomen and pelvis that was removed leading to 2 liters of liquid in the suction canister. In looking at the sleeve staple line, previous clips were in place. In the upper 1/3 of the sleeve there was a pulsatile bleed that was ligated with a 3-0 silk in figure of 8  stitch. There was also a bleed from the short gastric vessels in the area that was ligated with harmonic scalpel. The remainder of the area was inspected and no further bleeding was seen. The abdomen was irrigated with a large amount of warm saline to help remove more blood and look for additional bleeding.   The colon was distended likely from the blood inflaming the peritoneum concerning for ileus. Therefore, a nasogastric tube was placed and I was able to palpate the positioning down the sleeve. The sleeve was inflated with air to perform a leak test which was negative. The NG tube was left in place and placed to suction. The midline fascia was closed with 0 PDS in running fashion. The skin was closed with staples. She was brought to pacu in stable condition.  Findings: large amount of hematoma in upper abdomen and 2 liters blood in abdomen  Specimen: none  Implant: none   Blood loss: 268ml, in addition to 2 liters of blood in the abdomen and large amount of hematoma  Local anesthesia: none  Complications: none  Gurney Maxin, M.D. General, Bariatric, & Minimally Invasive Surgery [facility]

## 2019-02-17 NOTE — Progress Notes (Signed)
Spoke with [name] in the [place] who relayed message to Dr Kieth Brightly regarding patient's hypotension. Dr Kieth Brightly will be here to see patient in a few minutes. Donne Hazel, RN

## 2019-02-17 NOTE — Progress Notes (Signed)
Updated patient daughter [name] at patient request regarding return to OR.  Questions answered.

## 2019-02-17 NOTE — Progress Notes (Signed)
Patient hypotensive; Dr Kieth Brightly notified via page by Kelby Fam, RN.  Donne Hazel, RN

## 2019-02-17 NOTE — Progress Notes (Signed)
Patient transported to CT via bed. Donne Hazel, RN

## 2019-02-17 NOTE — Progress Notes (Signed)
Patient alert and oriented, Post op day 2. Sitting in chair, reports dizziness when ambulating, patient instructed to call staff when mobilizing.  Call bell within reach.  Abdomen distended, tender left side.  Passing small amount flatus per patient, denies nausea. Provided support and encouragement.  Encouraged pulmonary toilet and small sips of liquids.  Discussed plan of care with bedside RN. All questions answered.  Will continue to monitor.

## 2019-02-17 NOTE — Progress Notes (Signed)
   Progress Note: Metabolic and Bariatric Surgery Service   Chief Complaint/Subjective: Continued pain, received 1L fluid overnight for hypotension  Objective: Vital signs in last 24 hours: Temp:  [97.4 F (36.3 C)-98 F (36.7 C)] 97.7 F (36.5 C) (07/08 0855) Pulse Rate:  [60-75] 75 (07/08 0855) Resp:  [16-20] 18 (07/08 0855) BP: (85-127)/(46-74) 87/60 (07/08 0855) SpO2:  [93 %-98 %] 97 % (07/08 0855)    Intake/Output from previous day: 07/07 0701 - 07/08 0700 In: 3484.8 [P.O.:720; I.V.:2664.8; IV Piggyback:100] Out: 2138 [Urine:2138] Intake/Output this shift: No intake/output data recorded.  Lungs: nonlabored  Cardiovascular: RRR  Abd: soft, distended, tender in periumbilical region  Extremities: no edema  Neuro: AOx4  Lab Results: CBC  Recent Labs    02/16/19 0333 02/17/19 0412  WBC 8.1 15.0*  HGB 10.9* 7.9*  HCT 33.8* 24.6*  PLT 246 215   BMET Recent Labs    02/16/19 0333  NA 136  K 4.4  CL 105  CO2 24  GLUCOSE 137*  BUN 8  CREATININE 0.64  CALCIUM 9.0   PT/INR No results for input(s): LABPROT, INR in the last 72 hours. ABG No results for input(s): PHART, HCO3 in the last 72 hours.  Invalid input(s): PCO2, PO2  Studies/Results:  Anti-infectives: Anti-infectives (From admission, onward)   Start     Dose/Rate Route Frequency Ordered Stop   02/15/19 1045  cefoTEtan (CEFOTAN) 2 g in sodium chloride 0.9 % 100 mL IVPB     2 g 200 mL/hr over 30 Minutes Intravenous On call to O.R. 02/15/19 1030 02/15/19 1257      Medications: Scheduled Meds: . acetaminophen  1,000 mg Oral Q8H   Or  . acetaminophen (TYLENOL) oral liquid 160 mg/5 mL  1,000 mg Oral Q8H  . amLODipine  5 mg Oral Daily  . enoxaparin (LOVENOX) injection  30 mg Subcutaneous Q12H  . gabapentin  200 mg Oral Q12H  . Ensure Max Protein  2 oz Oral Q2H  . scopolamine  1 patch Transdermal On Call to OR   Continuous Infusions: . dextrose 5 % and 0.45% NaCl 100 mL/hr at 02/17/19  0539  . famotidine (PEPCID) IV 20 mg (02/16/19 2052)   PRN Meds:.ALPRAZolam, hydrALAZINE, morphine injection, ondansetron (ZOFRAN) IV, oxyCODONE, simethicone  Assessment/Plan: Patient Active Problem List   Diagnosis Date Noted  . Obesity 02/15/2019  . Pneumonia 11/19/2016  . Back pain 11/19/2016  . Constipation 04/27/2012  . GASTROESOPHAGEAL REFLUX DISEASE 06/26/2009  . SARCOIDOSIS, PULMONARY 06/23/2009  . PNEUMONIA 06/23/2009  . CHEST PAIN 06/23/2009   s/p Procedure(s): LAPAROSCOPIC GASTRIC SLEEVE RESECTION, UPPER ENDO, ERAS Pathway, Hiatal Hernia Repair 02/15/2019 Hgb down to 7.9 this morning -additional fluid today -recheck CBC midday -CT ab for hypotension and anemia and increased pain  Disposition:  LOS: 2 days  The patient does not meet criteria for discharge because She has a hemoglobin drop and will be watched for signs of bleeding  Mickeal Skinner, MD [phone] [facility]

## 2019-02-17 NOTE — Progress Notes (Signed)
CT scan showing blood with concern for active bleeding. She has received 1 unit and getting a second unit. She still have hypotension. I discussed the findings with the patient and think she needs to go back for laparoscopic exploration for active bleeding.

## 2019-02-17 NOTE — Anesthesia Postprocedure Evaluation (Signed)
Anesthesia Post Note  Patient: [name]  Procedure(s) Performed: LAPAROSCOPY DIAGNOSTIC CONVERTED TO OPEN, EVACUATION OF HEMATOMA AND LIGATION OF VESSEL (N/A )     Patient location during evaluation: PACU Anesthesia Type: General Level of consciousness: awake and alert Pain management: pain level controlled Vital Signs Assessment: post-procedure vital signs reviewed and stable Respiratory status: spontaneous breathing, nonlabored ventilation, respiratory function stable and patient connected to nasal cannula oxygen Cardiovascular status: blood pressure returned to baseline and stable Postop Assessment: no apparent nausea or vomiting Anesthetic complications: no Comments: Slightly increased O2 requirement compared to preop. Dips when sleeping, improves with incentive spirometer. Stays >90% with [place].    Last Vitals:  Vitals:   02/17/19 2005 02/17/19 2015  BP:  111/76  Pulse: 86 87  Resp: 16 16  Temp:    SpO2: 92% 91%    Last Pain:  Vitals:   02/17/19 1945  TempSrc:   PainSc: Asleep                 Lidia Collum

## 2019-02-17 NOTE — Progress Notes (Signed)
Low urinary output for last 8 hours reported to physician, bladder scan 34 cc.  Receiving blood at this time.

## 2019-02-17 NOTE — Progress Notes (Addendum)
Call made to CT scan dept to let them know pt has finished her contrast. CT scan to be done at 1300.  Donne Hazel, RN

## 2019-02-17 NOTE — Progress Notes (Signed)
CRITICAL VALUE ALERT  Critical Value:  Hgb 6.3  Date & Time Notied:  02/17/19  1158  Provider Notified: Dr. Kieth Brightly  Orders Received/Actions taken: No orders given. Awaiting CT scan.

## 2019-02-18 ENCOUNTER — Encounter (HOSPITAL_COMMUNITY): Payer: Self-pay | Admitting: General Surgery

## 2019-02-18 LAB — CBC
HCT: 24.7 % — ABNORMAL LOW (ref 36.0–46.0)
Hemoglobin: 8 g/dL — ABNORMAL LOW (ref 12.0–15.0)
MCH: 30 pg (ref 26.0–34.0)
MCHC: 32.4 g/dL (ref 30.0–36.0)
MCV: 92.5 fL (ref 80.0–100.0)
Platelets: 166 10*3/uL (ref 150–400)
RBC: 2.67 MIL/uL — ABNORMAL LOW (ref 3.87–5.11)
RDW: 14 % (ref 11.5–15.5)
WBC: 19.4 10*3/uL — ABNORMAL HIGH (ref 4.0–10.5)
nRBC: 0 % (ref 0.0–0.2)

## 2019-02-18 LAB — BASIC METABOLIC PANEL
Anion gap: 11 (ref 5–15)
BUN: 23 mg/dL — ABNORMAL HIGH (ref 6–20)
CO2: 18 mmol/L — ABNORMAL LOW (ref 22–32)
Calcium: 7.6 mg/dL — ABNORMAL LOW (ref 8.9–10.3)
Chloride: 108 mmol/L (ref 98–111)
Creatinine, Ser: 1.47 mg/dL — ABNORMAL HIGH (ref 0.44–1.00)
GFR calc Af Amer: 45 mL/min — ABNORMAL LOW (ref >60)
GFR calc non Af Amer: 39 mL/min — ABNORMAL LOW (ref >60)
Glucose, Bld: 152 mg/dL — ABNORMAL HIGH (ref 70–99)
Potassium: 4.1 mmol/L (ref 3.5–5.1)
Sodium: 137 mmol/L (ref 135–145)

## 2019-02-18 NOTE — Progress Notes (Signed)
Patient alert and oriented. Provided support and encouragement.  Encouraged pulmonary toilet, ambulation and small sips of liquids. Discussed plan of care with bedside RN.  Incision clean dry and intact.   All questions answered.  Will continue to monitor.

## 2019-02-18 NOTE — Progress Notes (Addendum)
PHARMACY CONSULT FOR:  Risk Assessment for Post-Discharge VTE Following Bariatric Surgery  Post-Discharge VTE Risk Assessment: This patient's probability of 30-day post-discharge VTE is increased due to the factors marked:   Female    Age >/=60 years    BMI >/=50 kg/m2    CHF    Dyspnea at Rest    Paraplegia    Non-gastric-band surgery    Operation Time >/=3 hr    Return to OR     Length of Stay >/= 3 d   Original Predicted probability of 30-day post-discharge VTE: 0.16% on POD1 (7/7)  Recommendation for Discharge: No pharmacologic prophylaxis post-discharge  Other patient-specific factors to consider: Hgb decreased and hyptension POD2, required transfusion and return to OR 7/8, prophylactic Lovenox d/c 7/8  Recommendation for d/c with Risk re-calculated with > 3 day hospital stay, and return to OR = 1.27% Lovenox 40mg  SQ q12 x 4 weeks  Allison RaringBarbara Chase is a 58 y.o. female who underwent  Laparoscopic gastric sleeve resection 7/6.   Case start: 1251 Case end: 1349   Allergies  Allergen Reactions  . Aspirin Hives    Patient Measurements: Height: 5\' 3"  (160 cm) Weight: 238 lb 9.6 oz (108.2 kg) IBW/kg (Calculated) : 52.4 Body mass index is 42.27 kg/m.  Recent Labs    02/16/19 0333  02/17/19 1139 02/17/19 1905 02/18/19 0422  WBC 8.1   < > 16.7* 20.5* 19.4*  HGB 10.9*   < > 6.3* 8.0* 8.0*  HCT 33.8*   < > 20.0* 24.4* 24.7*  PLT 246   < > 172 206 166  CREATININE 0.64  --   --   --  1.47*  ALBUMIN 3.8  --   --   --   --   PROT 8.4*  --   --   --   --   AST 25  --   --   --   --   ALT 23  --   --   --   --   ALKPHOS 84  --   --   --   --   BILITOT 0.8  --   --   --   --    < > = values in this interval not displayed.   Estimated Creatinine Clearance: 49.8 mL/min (A) (by C-G formula based on SCr of 1.47 mg/dL (H)).  Past Medical History:  Diagnosis Date  . Chest pain   . GERD (gastroesophageal reflux disease)   . Hypercholesterolemia   . Hypertension   .  Pneumonia   . Pulmonary sarcoidosis (HCC)    Treatment with Prednisone   Medications Prior to Admission  Medication Sig Dispense Refill Last Dose  . ALPRAZolam (XANAX) 0.5 MG tablet Take 0.5 mg by mouth 2 (two) times daily as needed for anxiety or sleep.    Past Week at Unknown time  . amLODipine (NORVASC) 5 MG tablet Take 5 mg by mouth daily.   02/15/2019 at 0630  . calcium carbonate (TUMS - DOSED IN MG ELEMENTAL CALCIUM) 500 MG chewable tablet Chew 1 tablet by mouth daily as needed for indigestion or heartburn.   Past Week at Unknown time  . cyclobenzaprine (FLEXERIL) 10 MG tablet Take 10 mg by mouth 2 (two) times daily as needed for muscle spasms.    02/14/2019 at Unknown time  . Multiple Vitamins-Minerals (WOMENS 50+ MULTI VITAMIN/MIN PO) Take 1 tablet by mouth daily.   Past Week at Unknown time  . HYDROcodone-acetaminophen (NORCO) 10-325 MG tablet  Take 1 tablet by mouth 2 (two) times daily as needed for moderate pain.   More than a month at Unknown time  . ibuprofen (ADVIL) 800 MG tablet Take 800 mg by mouth 2 (two) times daily as needed for headache or moderate pain.    More than a month at Unknown time   Minda Ditto PharmD Pager [phone] 02/18/2019, 1:27 PM

## 2019-02-18 NOTE — Progress Notes (Signed)
   Progress Note: Metabolic and Bariatric Surgery Service   Chief Complaint/Subjective: Slept well, upper incision pain, no nausea  Objective: Vital signs in last 24 hours: Temp:  [97.5 F (36.4 C)-100.8 F (38.2 C)] 100.6 F (38.1 C) (07/09 0625) Pulse Rate:  [72-102] 100 (07/09 0625) Resp:  [14-22] 18 (07/09 0625) BP: (87-126)/(55-99) 102/68 (07/09 0625) SpO2:  [89 %-100 %] 90 % (07/09 0625) Weight:  [108.2 kg] 108.2 kg (07/08 1659)    Intake/Output from previous day: 07/08 0701 - 07/09 0700 In: 4092.4 [P.O.:120; I.V.:3472.4; Blood:400; IV Piggyback:100] Out: 1950 [Urine:1850; Blood:100] Intake/Output this shift: No intake/output data recorded.  Lungs: nonlabored  Cardiovascular: RRR  Abd: soft, ATTP, incisions c/d/i, slight distension  Extremities: no edema  Neuro: AOx4  Lab Results: CBC  Recent Labs    02/17/19 1905 02/18/19 0422  WBC 20.5* 19.4*  HGB 8.0* 8.0*  HCT 24.4* 24.7*  PLT 206 166   BMET Recent Labs    02/16/19 0333 02/18/19 0422  NA 136 137  K 4.4 4.1  CL 105 108  CO2 24 18*  GLUCOSE 137* 152*  BUN 8 23*  CREATININE 0.64 1.47*  CALCIUM 9.0 7.6*   PT/INR No results for input(s): LABPROT, INR in the last 72 hours. ABG No results for input(s): PHART, HCO3 in the last 72 hours.  Invalid input(s): PCO2, PO2  Studies/Results:  Anti-infectives: Anti-infectives (From admission, onward)   Start     Dose/Rate Route Frequency Ordered Stop   02/17/19 1702  ceFAZolin (ANCEF) 2-4 GM/100ML-% IVPB    Note to Pharmacy: Maudry Diego  : cabinet override      02/17/19 1702 02/18/19 0514   02/15/19 1045  cefoTEtan (CEFOTAN) 2 g in sodium chloride 0.9 % 100 mL IVPB     2 g 200 mL/hr over 30 Minutes Intravenous On call to O.R. 02/15/19 1030 02/15/19 1257      Medications: Scheduled Meds: . acetaminophen  1,000 mg Oral Q8H   Or  . acetaminophen (TYLENOL) oral liquid 160 mg/5 mL  1,000 mg Oral Q8H  . gabapentin  200 mg Oral Q12H  .  HYDROmorphone      . Ensure Max Protein  2 oz Oral Q2H   Continuous Infusions: . dextrose 5 % and 0.45% NaCl 100 mL/hr at 02/18/19 0232  . famotidine (PEPCID) IV Stopped (02/17/19 2254)   PRN Meds:.ALPRAZolam, hydrALAZINE, morphine injection, ondansetron (ZOFRAN) IV, oxyCODONE, simethicone  Assessment/Plan: Patient Active Problem List   Diagnosis Date Noted  . Obesity 02/15/2019  . Pneumonia 11/19/2016  . Back pain 11/19/2016  . Constipation 04/27/2012  . GASTROESOPHAGEAL REFLUX DISEASE 06/26/2009  . SARCOIDOSIS, PULMONARY 06/23/2009  . PNEUMONIA 06/23/2009  . CHEST PAIN 06/23/2009   s/p Procedure(s): LAPAROSCOPY DIAGNOSTIC CONVERTED TO OPEN, EVACUATION OF HEMATOMA AND LIGATION OF VESSEL 02/17/2019 Sleeve gastrectomy 7/6 Re-exploration for bleeding 7/8 -labs stable today -SCDs, hold chemical proph -ambulate -remove foley and NG tube -bari clears  Disposition:  LOS: 3 days  The patient does not meet criteria for discharge because She has a hemoglobin drop and will be watched for signs of bleeding  Mickeal Skinner, MD [phone] [facility]

## 2019-02-18 NOTE — Care Management Important Message (Signed)
Important Message  Patient Details IM Letter given to [name] SW to present to the Patient Name: [name] MRN: [id number] Date of Birth: 29-Jan-1961   Medicare Important Message Given:  Yes     [name] 02/18/2019, 11:11 AM

## 2019-02-18 NOTE — Progress Notes (Signed)
Mobilized to hallway x 2, in chair for hour.  IS to 750. 2 liters nasal cannula with oxygen saturation at 92%.   Encouraged patient to continue to cough, deep breath and use IS.  Discussed using splinting with pillow to decrease abdominal discomfort.  Discussed importance of mobilization.  Taking in small amounts of clear fluids.   without nausea. Questions answered.  Discussed plan of care with Terri, bedside RN.

## 2019-02-19 LAB — CBC
HCT: 18.6 % — ABNORMAL LOW (ref 36.0–46.0)
Hemoglobin: 6 g/dL — CL (ref 12.0–15.0)
MCH: 29.9 pg (ref 26.0–34.0)
MCHC: 32.3 g/dL (ref 30.0–36.0)
MCV: 92.5 fL (ref 80.0–100.0)
Platelets: 167 10*3/uL (ref 150–400)
RBC: 2.01 MIL/uL — ABNORMAL LOW (ref 3.87–5.11)
RDW: 14.4 % (ref 11.5–15.5)
WBC: 16.8 10*3/uL — ABNORMAL HIGH (ref 4.0–10.5)
nRBC: 0.2 % (ref 0.0–0.2)

## 2019-02-19 LAB — TYPE AND SCREEN
ABO/RH(D): O POS
Antibody Screen: NEGATIVE
Unit division: 0
Unit division: 0
Unit division: 0
Unit division: 0

## 2019-02-19 LAB — BPAM RBC
Blood Product Expiration Date: 202008052359
Blood Product Expiration Date: 202008092359
Blood Product Expiration Date: 202008102359
Blood Product Expiration Date: 202008102359
ISSUE DATE / TIME: 202007081219
ISSUE DATE / TIME: 202007081536
Unit Type and Rh: 5100
Unit Type and Rh: 5100
Unit Type and Rh: 5100
Unit Type and Rh: 5100

## 2019-02-19 LAB — BASIC METABOLIC PANEL
Anion gap: 7 (ref 5–15)
BUN: 14 mg/dL (ref 6–20)
CO2: 23 mmol/L (ref 22–32)
Calcium: 8 mg/dL — ABNORMAL LOW (ref 8.9–10.3)
Chloride: 106 mmol/L (ref 98–111)
Creatinine, Ser: 0.82 mg/dL (ref 0.44–1.00)
GFR calc Af Amer: >60 mL/min (ref >60)
GFR calc non Af Amer: >60 mL/min (ref >60)
Glucose, Bld: 118 mg/dL — ABNORMAL HIGH (ref 70–99)
Potassium: 3.8 mmol/L (ref 3.5–5.1)
Sodium: 136 mmol/L (ref 135–145)

## 2019-02-19 LAB — PREPARE RBC (CROSSMATCH)

## 2019-02-19 LAB — HEMOGLOBIN AND HEMATOCRIT, BLOOD
HCT: 19.9 % — ABNORMAL LOW (ref 36.0–46.0)
HCT: 26.7 % — ABNORMAL LOW (ref 36.0–46.0)
Hemoglobin: 6.6 g/dL — CL (ref 12.0–15.0)
Hemoglobin: 8.9 g/dL — ABNORMAL LOW (ref 12.0–15.0)

## 2019-02-19 MED ORDER — SODIUM CHLORIDE 0.9% IV SOLUTION
Freq: Once | INTRAVENOUS | Status: AC
  Administered 2019-02-19: 10 mL via INTRAVENOUS

## 2019-02-19 MED ORDER — SODIUM CHLORIDE 0.9% IV SOLUTION: Freq: Once | INTRAVENOUS | Status: DC

## 2019-02-19 MED ORDER — LIP MEDEX EX OINT
TOPICAL_OINTMENT | CUTANEOUS | Status: AC
  Filled 2019-02-19: qty 7

## 2019-02-19 NOTE — Progress Notes (Signed)
Patient alert and oriented, sitting in recliner. Provided support and encouragement.  Discussed events since surgery, questions answered.  Patient has been in contact with family via cellphone, did not feel a family update was needed from Bariatric Nurse Coordinator.   Encouraged pulmonary toilet (IS [phone]), ambulation and small sips of clear liquids. Will continue to monitor.

## 2019-02-19 NOTE — Progress Notes (Signed)
PHARMACY CONSULT FOR:  Risk Assessment for Post-Discharge VTE Following Bariatric Surgery  Post-Discharge VTE Risk Assessment: This patient's probability of 30-day post-discharge VTE is increased due to the factors marked:   Female    Age >/=60 years    BMI >/=50 kg/m2    CHF    Dyspnea at Rest    Paraplegia  x  Non-gastric-band surgery    Operation Time >/=3 hr   x Return to OR   x  Length of Stay >/= 3 d   Original Predicted probability of 30-day post-discharge VTE: 0.16% on POD1 (7/7)  Recommendation for Discharge: No pharmacologic prophylaxis post-discharge  Other patient-specific factors to consider: Hgb decreased and hyptension POD2, required transfusion and return to OR 7/8, prophylactic Lovenox d/c 7/8  Recommendation for d/c with Risk re-calculated with > 3 day hospital stay, and return to OR = 1.27% Lovenox 40mg  SQ q12 x 4 weeks  [name] is a 58 y.o. female who underwent  Laparoscopic gastric sleeve resection 7/6. Lovenox discontinued 7/8 with low Hgb, hypotension. Transfused 7/8, return to OR. 7/10 Hgb decreased again, plan transfuse.    Case start: 1251 Case end: 1349   Allergies  Allergen Reactions  . Aspirin Hives    Patient Measurements: Height: 5\' 3"  (160 cm) Weight: 238 lb 9.6 oz (108.2 kg) IBW/kg (Calculated) : 52.4 Body mass index is 42.27 kg/m.  Recent Labs    02/17/19 1905 02/18/19 0422 02/19/19 0319  WBC 20.5* 19.4* 16.8*  HGB 8.0* 8.0* 6.0*  HCT 24.4* 24.7* 18.6*  PLT 206 166 167  CREATININE  --  1.47* 0.82   Estimated Creatinine Clearance: 89.3 mL/min (by C-G formula based on SCr of 0.82 mg/dL).  Past Medical History:  Diagnosis Date  . Chest pain   . GERD (gastroesophageal reflux disease)   . Hypercholesterolemia   . Hypertension   . Pneumonia   . Pulmonary sarcoidosis ([place])    Treatment with Prednisone   Medications Prior to Admission  Medication Sig Dispense Refill Last Dose  . ALPRAZolam (XANAX) 0.5 MG tablet Take  0.5 mg by mouth 2 (two) times daily as needed for anxiety or sleep.    Past Week at Unknown time  . amLODipine (NORVASC) 5 MG tablet Take 5 mg by mouth daily.   02/15/2019 at 0630  . calcium carbonate (TUMS - DOSED IN MG ELEMENTAL CALCIUM) 500 MG chewable tablet Chew 1 tablet by mouth daily as needed for indigestion or heartburn.   Past Week at Unknown time  . cyclobenzaprine (FLEXERIL) 10 MG tablet Take 10 mg by mouth 2 (two) times daily as needed for muscle spasms.    02/14/2019 at Unknown time  . Multiple Vitamins-Minerals (WOMENS 50+ MULTI VITAMIN/MIN PO) Take 1 tablet by mouth daily.   Past Week at Unknown time  . HYDROcodone-acetaminophen (NORCO) 10-325 MG tablet Take 1 tablet by mouth 2 (two) times daily as needed for moderate pain.   More than a month at Unknown time  . ibuprofen (ADVIL) 800 MG tablet Take 800 mg by mouth 2 (two) times daily as needed for headache or moderate pain.    More than a month at Unknown time   Minda Ditto PharmD Pager [phone] 02/19/2019, 9:22 AM

## 2019-02-19 NOTE — Progress Notes (Signed)
At this time, the pt is refusing pain medication, even Tylenol. Will request a Kpad to help some of her discomfort. States she is afraid of becoming addicted.

## 2019-02-19 NOTE — Progress Notes (Signed)
CRITICAL VALUE ALERT  Critical Value:  Hgb 6.0  Date & Time Notied:   0710/2020 @ 0345 am  Provider Notified: Dr. Barry Dienes   Orders Received/Actions taken:   @ [phone] Verbal order was given to tranfuse 1 unit PRBC .

## 2019-02-19 NOTE — Progress Notes (Signed)
CRITICAL VALUE ALERT  Critical Value:  6.6  Date & Time Notied:  1136  Provider Notified: Kinsinger  Orders Received/Actions taken: awaiting orders

## 2019-02-19 NOTE — Progress Notes (Signed)
   Progress Note: Metabolic and Bariatric Surgery Service   Chief Complaint/Subjective: Continued pain, tolerating liquids, +flatus  Objective: Vital signs in last 24 hours: Temp:  [97.9 F (36.6 C)-101 F (38.3 C)] 99.3 F (37.4 C) (07/10 0819) Pulse Rate:  [88-100] 97 (07/10 0819) Resp:  [18-20] 20 (07/10 0621) BP: (118-147)/(57-80) 121/66 (07/10 0819) SpO2:  [91 %-96 %] 92 % (07/10 0819)    Intake/Output from previous day: 07/09 0701 - 07/10 0700 In: 2536.3 [P.O.:420; I.V.:2065.6; IV Piggyback:50.6] Out: 2450 [Urine:2450] Intake/Output this shift: Total I/O In: -  Out: 200 [Urine:200]  Lungs: nonlabored  Cardiovascular: RRR  Abd: soft, TTP, incision c/d/i  Extremities: no edema  Neuro: AOx4  Lab Results: CBC  Recent Labs    02/18/19 0422 02/19/19 0319  WBC 19.4* 16.8*  HGB 8.0* 6.0*  HCT 24.7* 18.6*  PLT 166 167   BMET Recent Labs    02/18/19 0422 02/19/19 0319  NA 137 136  K 4.1 3.8  CL 108 106  CO2 18* 23  GLUCOSE 152* 118*  BUN 23* 14  CREATININE 1.47* 0.82  CALCIUM 7.6* 8.0*   PT/INR No results for input(s): LABPROT, INR in the last 72 hours. ABG No results for input(s): PHART, HCO3 in the last 72 hours.  Invalid input(s): PCO2, PO2  Studies/Results:  Anti-infectives: Anti-infectives (From admission, onward)   Start     Dose/Rate Route Frequency Ordered Stop   02/17/19 1702  ceFAZolin (ANCEF) 2-4 GM/100ML-% IVPB    Note to Pharmacy: Maudry Diego  : cabinet override      02/17/19 1702 02/18/19 0514   02/15/19 1045  cefoTEtan (CEFOTAN) 2 g in sodium chloride 0.9 % 100 mL IVPB     2 g 200 mL/hr over 30 Minutes Intravenous On call to O.R. 02/15/19 1030 02/15/19 1257      Medications: Scheduled Meds: . acetaminophen  1,000 mg Oral Q8H   Or  . acetaminophen (TYLENOL) oral liquid 160 mg/5 mL  1,000 mg Oral Q8H  . gabapentin  200 mg Oral Q12H  . Ensure Max Protein  2 oz Oral Q2H   Continuous Infusions: . dextrose 5 % and  0.45% NaCl 100 mL/hr at 02/19/19 0627  . famotidine (PEPCID) IV 20 mg (02/18/19 2136)   PRN Meds:.ALPRAZolam, hydrALAZINE, morphine injection, ondansetron (ZOFRAN) IV, oxyCODONE, simethicone  Assessment/Plan: Patient Active Problem List   Diagnosis Date Noted  . Obesity 02/15/2019  . Pneumonia 11/19/2016  . Back pain 11/19/2016  . Constipation 04/27/2012  . GASTROESOPHAGEAL REFLUX DISEASE 06/26/2009  . SARCOIDOSIS, PULMONARY 06/23/2009  . PNEUMONIA 06/23/2009  . CHEST PAIN 06/23/2009   s/p Procedure(s): LAPAROSCOPY DIAGNOSTIC CONVERTED TO OPEN, EVACUATION OF HEMATOMA AND LIGATION OF VESSEL 02/17/2019 Cr improved, Hgb downtrending, received transfusion overnight -recheck hgb after transfusion -continue inpatient care for bleeding risk -clears now, if hgb after transfusion is good then may have shakes -continue ambulation  Disposition:  LOS: 4 days  The patient does not meet criteria for discharge because She has a hemoglobin drop and will be watched for signs of bleeding  Mickeal Skinner, MD [phone] [facility]

## 2019-02-20 LAB — CBC
HCT: 25.9 % — ABNORMAL LOW (ref 36.0–46.0)
Hemoglobin: 8.7 g/dL — ABNORMAL LOW (ref 12.0–15.0)
MCH: 29.5 pg (ref 26.0–34.0)
MCHC: 33.6 g/dL (ref 30.0–36.0)
MCV: 87.8 fL (ref 80.0–100.0)
Platelets: 210 10*3/uL (ref 150–400)
RBC: 2.95 MIL/uL — ABNORMAL LOW (ref 3.87–5.11)
RDW: 14.2 % (ref 11.5–15.5)
WBC: 12.8 10*3/uL — ABNORMAL HIGH (ref 4.0–10.5)
nRBC: 0.4 % — ABNORMAL HIGH (ref 0.0–0.2)

## 2019-02-20 NOTE — Progress Notes (Signed)
Pt having diarrhea after drinking some Ensure Max. Pt stated she is lactose intolerant.  Pt given injury, chicken soup, in place of Ensure Max.  Pt tolerating well at this time.

## 2019-02-20 NOTE — Progress Notes (Signed)
     Assessment & Plan: POD#3 - status post ex lap with control of hemorrhage  Hgb stable overnight 8.9 - 8.7 mg/dl  Tolerating liquid diet, loose BM's  Limited ambulation        [name], MD       [facility]       Office: [phone]   Chief Complaint: Post op bleeding following sleeve gastrectomy  Subjective: Patient in bed, some pain.  Loose BM's.  Tolerating liquid diet.  Objective: Vital signs in last 24 hours: Temp:  [99.3 F (37.4 C)-102.4 F (39.1 C)] 100.1 F (37.8 C) (07/11 0603) Pulse Rate:  [59-94] 86 (07/11 0603) Resp:  [18-20] 20 (07/11 0603) BP: (119-157)/(70-89) 127/70 (07/11 0603) SpO2:  [89 %-100 %] 96 % (07/11 0603) Last BM Date: 02/19/19  Intake/Output from previous day: 07/10 0701 - 07/11 0700 In: 3206.3 [P.O.:300; I.V.:2182.3; Blood:624; IV Piggyback:100] Out: 2100 [Urine:2100] Intake/Output this shift: No intake/output data recorded.  Physical Exam: HEENT - sclerae clear, mucous membranes moist Neck - soft Chest - clear bilaterally Cor - RRR Abdomen - protuberant, BS present; wounds dry and intact Ext - no edema, non-tender Neuro - alert & oriented, no focal deficits  Lab Results:  Recent Labs    02/19/19 0319  02/19/19 2034 02/20/19 0509  WBC 16.8*  --   --  12.8*  HGB 6.0*   < > 8.9* 8.7*  HCT 18.6*   < > 26.7* 25.9*  PLT 167  --   --  210   < > = values in this interval not displayed.   BMET Recent Labs    02/18/19 0422 02/19/19 0319  NA 137 136  K 4.1 3.8  CL 108 106  CO2 18* 23  GLUCOSE 152* 118*  BUN 23* 14  CREATININE 1.47* 0.82  CALCIUM 7.6* 8.0*   PT/INR No results for input(s): LABPROT, INR in the last 72 hours. Comprehensive Metabolic Panel:    Component Value Date/Time   NA 136 02/19/2019 0319   NA 137 02/18/2019 0422   NA 139 04/11/2015 1220   K 3.8 02/19/2019 0319   K 4.1 02/18/2019 0422   CL 106 02/19/2019 0319   CL 108 02/18/2019 0422   CO2 23 02/19/2019 0319   CO2 18  (L) 02/18/2019 0422   BUN 14 02/19/2019 0319   BUN 23 (H) 02/18/2019 0422   BUN 8 04/11/2015 1220   CREATININE 0.82 02/19/2019 0319   CREATININE 1.47 (H) 02/18/2019 0422   GLUCOSE 118 (H) 02/19/2019 0319   GLUCOSE 152 (H) 02/18/2019 0422   CALCIUM 8.0 (L) 02/19/2019 0319   CALCIUM 7.6 (L) 02/18/2019 0422   AST 25 02/16/2019 0333   AST 18 02/09/2019 1107   ALT 23 02/16/2019 0333   ALT 15 02/09/2019 1107   ALKPHOS 84 02/16/2019 0333   ALKPHOS 87 02/09/2019 1107   BILITOT 0.8 02/16/2019 0333   BILITOT 1.0 02/09/2019 1107   BILITOT 0.4 04/11/2015 1220   PROT 8.4 (H) 02/16/2019 0333   PROT 8.9 (H) 02/09/2019 1107   PROT 8.3 04/11/2015 1220   ALBUMIN 3.8 02/16/2019 0333   ALBUMIN 4.1 02/09/2019 1107   ALBUMIN 4.1 04/11/2015 1220    Studies/Results: No results found.    [name] 02/20/2019  Patient ID: [name], female   DOB: 1960-10-14, 58 y.o.   MRN: [id number]

## 2019-02-21 LAB — TYPE AND SCREEN
ABO/RH(D): O POS
Antibody Screen: NEGATIVE
Unit division: 0
Unit division: 0
Unit division: 0

## 2019-02-21 LAB — BPAM RBC
Blood Product Expiration Date: 202007312359
Blood Product Expiration Date: 202008102359
Blood Product Expiration Date: 202008112359
ISSUE DATE / TIME: 202007100556
ISSUE DATE / TIME: 202007101255
ISSUE DATE / TIME: 202007101554
Unit Type and Rh: 5100
Unit Type and Rh: 5100
Unit Type and Rh: 5100

## 2019-02-21 LAB — CBC WITH DIFFERENTIAL/PLATELET
Abs Immature Granulocytes: 0.2 10*3/uL — ABNORMAL HIGH (ref 0.00–0.07)
Basophils Absolute: 0 10*3/uL (ref 0.0–0.1)
Basophils Relative: 0 %
Eosinophils Absolute: 0.1 10*3/uL (ref 0.0–0.5)
Eosinophils Relative: 1 %
HCT: 26.2 % — ABNORMAL LOW (ref 36.0–46.0)
Hemoglobin: 8.6 g/dL — ABNORMAL LOW (ref 12.0–15.0)
Immature Granulocytes: 2 %
Lymphocytes Relative: 6 %
Lymphs Abs: 0.8 10*3/uL (ref 0.7–4.0)
MCH: 29.8 pg (ref 26.0–34.0)
MCHC: 32.8 g/dL (ref 30.0–36.0)
MCV: 90.7 fL (ref 80.0–100.0)
Monocytes Absolute: 1.1 10*3/uL — ABNORMAL HIGH (ref 0.1–1.0)
Monocytes Relative: 9 %
Neutro Abs: 11 10*3/uL — ABNORMAL HIGH (ref 1.7–7.7)
Neutrophils Relative %: 82 %
Platelets: 213 10*3/uL (ref 150–400)
RBC: 2.89 MIL/uL — ABNORMAL LOW (ref 3.87–5.11)
RDW: 14.6 % (ref 11.5–15.5)
WBC: 13.2 10*3/uL — ABNORMAL HIGH (ref 4.0–10.5)
nRBC: 0.5 % — ABNORMAL HIGH (ref 0.0–0.2)

## 2019-02-21 MED ORDER — ENOXAPARIN SODIUM 30 MG/0.3ML ~~LOC~~ SOLN
30.0000 mg | Freq: Two times a day (BID) | SUBCUTANEOUS | Status: DC
  Administered 2019-02-21: 30 mg via SUBCUTANEOUS
  Filled 2019-02-21: qty 0.3

## 2019-02-21 NOTE — Progress Notes (Signed)
Pt honeycomb dressing noted saturated and leaking with serosanguinous drainage. Dr. Harlow Asa paged and informed of dressing status.  Orders to remove honeycomb and replace with 4x4s and abd pads and monitor drainage carried out.

## 2019-02-21 NOTE — Progress Notes (Signed)
     Assessment & Plan: POD#4 - status post ex lap with control of hemorrhage             Hgb stable overnight again - 8.6 mg/dl  Monitor WBC - 13K this AM - repeat in AM             Tolerating liquid diet, loose BM's             Limited ambulation - encouraged OOB, ambulation, IS use  Tentatively home tomorrow per Dr. Scherrie Gerlach, MD       [facility]       Office: [phone]   Chief Complaint: Post op bleeding after sleeve gastrectomy  Subjective: Patient in bed, somewhat somnolent, arouses easily.  Taking po liquids, complains of diarrhea.  Has not ambulated much.  Objective: Vital signs in last 24 hours: Temp:  [99.3 F (37.4 C)-102.8 F (39.3 C)] 99.3 F (37.4 C) (07/12 7106) Pulse Rate:  [89-95] 91 (07/12 0147) Resp:  [16-24] 20 (07/12 2694) BP: (137-155)/(71-87) 154/87 (07/12 0147) SpO2:  [89 %-98 %] 98 % (07/12 0147) Last BM Date: 02/20/19  Intake/Output from previous day: 07/11 0701 - 07/12 0700 In: 3564.5 [P.O.:1320; I.V.:2144.5; IV Piggyback:100] Out: 1901 [Urine:1900; Stool:1] Intake/Output this shift: No intake/output data recorded.  Physical Exam: HEENT - sclerae clear, mucous membranes moist Neck - soft Chest - clear bilaterally Cor - RRR Abdomen - protuberant, softer; wounds dry and intact, dressing intact   Lab Results:  Recent Labs    02/20/19 0509 02/21/19 0348  WBC 12.8* 13.2*  HGB 8.7* 8.6*  HCT 25.9* 26.2*  PLT 210 213   BMET Recent Labs    02/19/19 0319  NA 136  K 3.8  CL 106  CO2 23  GLUCOSE 118*  BUN 14  CREATININE 0.82  CALCIUM 8.0*   PT/INR No results for input(s): LABPROT, INR in the last 72 hours. Comprehensive Metabolic Panel:    Component Value Date/Time   NA 136 02/19/2019 0319   NA 137 02/18/2019 0422   NA 139 04/11/2015 1220   K 3.8 02/19/2019 0319   K 4.1 02/18/2019 0422   CL 106 02/19/2019 0319   CL 108 02/18/2019 0422   CO2 23 02/19/2019 0319   CO2 18 (L)  02/18/2019 0422   BUN 14 02/19/2019 0319   BUN 23 (H) 02/18/2019 0422   BUN 8 04/11/2015 1220   CREATININE 0.82 02/19/2019 0319   CREATININE 1.47 (H) 02/18/2019 0422   GLUCOSE 118 (H) 02/19/2019 0319   GLUCOSE 152 (H) 02/18/2019 0422   CALCIUM 8.0 (L) 02/19/2019 0319   CALCIUM 7.6 (L) 02/18/2019 0422   AST 25 02/16/2019 0333   AST 18 02/09/2019 1107   ALT 23 02/16/2019 0333   ALT 15 02/09/2019 1107   ALKPHOS 84 02/16/2019 0333   ALKPHOS 87 02/09/2019 1107   BILITOT 0.8 02/16/2019 0333   BILITOT 1.0 02/09/2019 1107   BILITOT 0.4 04/11/2015 1220   PROT 8.4 (H) 02/16/2019 0333   PROT 8.9 (H) 02/09/2019 1107   PROT 8.3 04/11/2015 1220   ALBUMIN 3.8 02/16/2019 0333   ALBUMIN 4.1 02/09/2019 1107   ALBUMIN 4.1 04/11/2015 1220    Studies/Results: No results found.    [name] 02/21/2019  Patient ID: [name], female   DOB: 1960-12-22, 58 y.o.   MRN: [id number]

## 2019-02-22 ENCOUNTER — Inpatient Hospital Stay (HOSPITAL_COMMUNITY): Payer: Medicare Other

## 2019-02-22 LAB — CBC
HCT: 25.6 % — ABNORMAL LOW (ref 36.0–46.0)
Hemoglobin: 8.4 g/dL — ABNORMAL LOW (ref 12.0–15.0)
MCH: 29.8 pg (ref 26.0–34.0)
MCHC: 32.8 g/dL (ref 30.0–36.0)
MCV: 90.8 fL (ref 80.0–100.0)
Platelets: 247 10*3/uL (ref 150–400)
RBC: 2.82 MIL/uL — ABNORMAL LOW (ref 3.87–5.11)
RDW: 14.6 % (ref 11.5–15.5)
WBC: 12.9 10*3/uL — ABNORMAL HIGH (ref 4.0–10.5)
nRBC: 0.4 % — ABNORMAL HIGH (ref 0.0–0.2)

## 2019-02-22 MED ORDER — ACETAMINOPHEN 500 MG PO TABS: 1000.0000 mg | ORAL_TABLET | Freq: Three times a day (TID) | ORAL | 0 refills | Status: DC

## 2019-02-22 MED ORDER — FUROSEMIDE 10 MG/ML IJ SOLN
40.0000 mg | Freq: Once | INTRAMUSCULAR | Status: AC
  Administered 2019-02-22: 40 mg via INTRAVENOUS
  Filled 2019-02-22: qty 4

## 2019-02-22 MED ORDER — ONDANSETRON 4 MG PO TBDP: 4.0000 mg | ORAL_TABLET | Freq: Four times a day (QID) | ORAL | 0 refills | Status: DC | PRN

## 2019-02-22 MED ORDER — ENOXAPARIN (LOVENOX) PATIENT EDUCATION KIT
PACK | Freq: Once | Status: AC
  Administered 2019-02-22: 17:00:00
  Filled 2019-02-22: qty 1

## 2019-02-22 MED ORDER — ENOXAPARIN SODIUM 40 MG/0.4ML ~~LOC~~ SOLN: 40.0000 mg | SUBCUTANEOUS | 0 refills | Status: DC

## 2019-02-22 MED ORDER — PANTOPRAZOLE SODIUM 40 MG PO TBEC: 40.0000 mg | DELAYED_RELEASE_TABLET | Freq: Every day | ORAL | 0 refills | Status: DC

## 2019-02-22 MED ORDER — SODIUM CHLORIDE 0.9 % IV SOLN
500.0000 mg | INTRAVENOUS | Status: DC
  Administered 2019-02-22: 500 mg via INTRAVENOUS
  Filled 2019-02-22 (×2): qty 500

## 2019-02-22 MED ORDER — SODIUM CHLORIDE 0.9 % IV SOLN
2.0000 g | INTRAVENOUS | Status: DC
  Administered 2019-02-22: 2 g via INTRAVENOUS
  Filled 2019-02-22: qty 2
  Filled 2019-02-22: qty 20

## 2019-02-22 MED ORDER — GABAPENTIN 100 MG PO CAPS: 200.0000 mg | ORAL_CAPSULE | Freq: Two times a day (BID) | ORAL | 0 refills | Status: DC

## 2019-02-22 MED ORDER — OXYCODONE HCL 5 MG PO TABS: 5.0000 mg | ORAL_TABLET | Freq: Four times a day (QID) | ORAL | 0 refills | Status: DC | PRN

## 2019-02-22 MED ORDER — PRO-STAT SUGAR FREE PO LIQD
30.0000 mL | Freq: Four times a day (QID) | ORAL | Status: DC
  Administered 2019-02-22 – 2019-02-23 (×4): 30 mL via ORAL
  Filled 2019-02-22 (×3): qty 30

## 2019-02-22 NOTE — Discharge Summary (Signed)
Physician Discharge Summary  Patient ID: [name] MRN: [id number] DOB/AGE: August 12, 1961 58 y.o.  Admit date: 02/15/2019 Discharge date: 02/23/2019  Admission Diagnoses:  Discharge Diagnoses:  Active Problems:   Obesity   Discharged Condition: good  Hospital Course: 58 yo female with obesity presented for sleeve gastrectomy. She underwent procedure and did well. She had increased abdominal pain POD 1. POD 2 she had a hemoglobin drop. She underwent CT showing bleed and was transfused blood and taken to the operating room for exploration. She did well afterward. She required 3 additional units of blood. VTE chemical proph was restarted and she was discharged home with stable Hgb with 2 weeks VTE proph due to increased risk. She had low O2 sats and had lasix to diurese. She had a CT scan performed to r/o PE which was negative but did show multifocal infiltrates. She was treated with antibiotics and improved and was discharged home.  Consults: None  Significant Diagnostic Studies:  CBC    Component Value Date/Time   WBC 11.8 (H) 02/23/2019 0428   RBC 2.83 (L) 02/23/2019 0428   HGB 8.3 (L) 02/23/2019 0428   HCT 25.4 (L) 02/23/2019 0428   PLT 287 02/23/2019 0428   MCV 89.8 02/23/2019 0428   MCH 29.3 02/23/2019 0428   MCHC 32.7 02/23/2019 0428   RDW 14.5 02/23/2019 0428   LYMPHSABS 0.8 02/21/2019 0348   MONOABS 1.1 (H) 02/21/2019 0348   EOSABS 0.1 02/21/2019 0348   BASOSABS 0.0 02/21/2019 0348     Treatments: blood transfusion x 5, sleeve gastrectomy, reoperation for bleeding  Discharge Exam: Blood pressure 140/74, pulse 79, temperature 98.6 F (37 C), temperature source Oral, resp. rate 18, height 5\' 3"  (1.6 m), weight 108.2 kg, SpO2 90 %. General appearance: alert and cooperative Head: Normocephalic, without obvious abnormality, atraumatic Resp: clear to auscultation bilaterally Cardio: regular rate and rhythm, S1, S2 normal, no murmur, click, rub or gallop GI: soft,  non-tender; bowel sounds normal; no masses,  no organomegaly  Disposition: Discharge disposition: 01-Home or Self Care       Discharge Instructions    Ambulate hourly while awake   Complete by: As directed    Call MD for:  difficulty breathing, headache or visual disturbances   Complete by: As directed    Call MD for:  persistant dizziness or light-headedness   Complete by: As directed    Call MD for:  persistant nausea and vomiting   Complete by: As directed    Call MD for:  redness, tenderness, or signs of infection (pain, swelling, redness, odor or green/yellow discharge around incision site)   Complete by: As directed    Call MD for:  severe uncontrolled pain   Complete by: As directed    Call MD for:  temperature >101 F   Complete by: As directed    Diet bariatric full liquid   Complete by: As directed    Discharge wound care:   Complete by: As directed    Remove Bandaids tomorrow, ok to shower tomorrow. Steristrips may fall off in 1-3 weeks.   Incentive spirometry   Complete by: As directed    Perform hourly while awake     Allergies as of 02/23/2019      Reactions   Aspirin Hives      Medication List    STOP taking these medications   HYDROcodone-acetaminophen 10-325 MG tablet Commonly known as: NORCO   ibuprofen 800 MG tablet Commonly known as: ADVIL  TAKE these medications   acetaminophen 500 MG tablet Commonly known as: TYLENOL Take 2 tablets (1,000 mg total) by mouth every 8 (eight) hours for 5 days.   ALPRAZolam 0.5 MG tablet Commonly known as: XANAX Take 0.5 mg by mouth 2 (two) times daily as needed for anxiety or sleep.   amLODipine 5 MG tablet Commonly known as: NORVASC Take 5 mg by mouth daily. Notes to patient: Monitor Blood Pressure Daily and keep a log for primary care physician.  You may need to make changes to your medications with rapid weight loss.     azithromycin 500 MG tablet Commonly known as: Zithromax Take 1 tablet (500  mg total) by mouth daily.   calcium carbonate 500 MG chewable tablet Commonly known as: TUMS - dosed in mg elemental calcium Chew 1 tablet by mouth daily as needed for indigestion or heartburn.   cyclobenzaprine 10 MG tablet Commonly known as: FLEXERIL Take 10 mg by mouth 2 (two) times daily as needed for muscle spasms.   enoxaparin 40 MG/0.4ML injection Commonly known as: LOVENOX Inject 0.4 mLs (40 mg total) into the skin daily for 14 doses.   gabapentin 100 MG capsule Commonly known as: NEURONTIN Take 2 capsules (200 mg total) by mouth every 12 (twelve) hours.   ondansetron 4 MG disintegrating tablet Commonly known as: ZOFRAN-ODT Take 1 tablet (4 mg total) by mouth every 6 (six) hours as needed for nausea or vomiting.   oxyCODONE 5 MG immediate release tablet Commonly known as: Oxy IR/ROXICODONE Take 1 tablet (5 mg total) by mouth every 6 (six) hours as needed for severe pain.   pantoprazole 40 MG tablet Commonly known as: PROTONIX Take 1 tablet (40 mg total) by mouth daily.   WOMENS 50+ MULTI VITAMIN/MIN PO Take 1 tablet by mouth daily.            Discharge Care Instructions  (From admission, onward)         Start     Ordered   02/22/19 0000  Discharge wound care:    Comments: Remove Bandaids tomorrow, ok to shower tomorrow. Steristrips may fall off in 1-3 weeks.   02/22/19 0911         Follow-up Information    Kailynn Satterly, De BlanchLuke Aaron, MD. Go on 03/17/2019.   Specialty: General Surgery Why: at 1050 Contact information: [address] [place] [address] [phone]        Reno Clasby, De BlanchLuke Aaron, MD .   Specialty: General Surgery Contact information: [address] [place] [address] [phone]        Kari BaarsHawkins, Edward, MD. Call in 2 week(s).   Specialty: Pulmonary Disease Contact information: [address] [place] [address] [phone]           Signed: De BlanchLuke Aaron Fred Hammes 02/23/2019, 1:35 PM

## 2019-02-22 NOTE — Progress Notes (Signed)
Dr. Kieth Brightly aware of CXR results, discharge on hold per physician.   Lasix ordered by MD.

## 2019-02-22 NOTE — Care Management Important Message (Signed)
Important Message  Patient Details IM Letter given to [name]  SW to present to the Patient Name: [name] MRN: [id number] Date of Birth: Jan 29, 1961   Medicare Important Message Given:  Yes     [name] 02/22/2019, 10:52 AM

## 2019-02-22 NOTE — Progress Notes (Signed)
Progress Note: Metabolic and Bariatric Surgery Service   Chief Complaint/Subjective: Wants to go home, +BM, tolerating liquids, unable to wean off O2  Objective: Vital signs in last 24 hours: Temp:  [97.8 F (36.6 C)-102.4 F (39.1 C)] 97.8 F (36.6 C) (07/13 1309) Pulse Rate:  [79-91] 81 (07/13 1309) Resp:  [18-24] 18 (07/13 1309) BP: (115-142)/(67-80) 115/67 (07/13 1309) SpO2:  [83 %-95 %] 91 % (07/13 1550) Last BM Date: 02/21/19  Intake/Output from previous day: 07/12 0701 - 07/13 0700 In: 2518.1 [P.O.:360; I.V.:1908.1; IV Piggyback:250] Out: 1000 [Urine:1000] Intake/Output this shift: Total I/O In: 755.2 [P.O.:360; I.V.:395.2] Out: 900 [Urine:900]  Lungs: nonlabored  Cardiovascular: RRR  Abd: soft, ATTP  Extremities: trace edema  Neuro: AOx4  Lab Results: CBC  Recent Labs    02/21/19 0348 02/22/19 0344  WBC 13.2* 12.9*  HGB 8.6* 8.4*  HCT 26.2* 25.6*  PLT 213 247   BMET No results for input(s): NA, K, CL, CO2, GLUCOSE, BUN, CREATININE, CALCIUM in the last 72 hours. PT/INR No results for input(s): LABPROT, INR in the last 72 hours. ABG No results for input(s): PHART, HCO3 in the last 72 hours.  Invalid input(s): PCO2, PO2  Studies/Results:  Anti-infectives: Anti-infectives (From admission, onward)   Start     Dose/Rate Route Frequency Ordered Stop   02/22/19 1715  cefTRIAXone (ROCEPHIN) 2 g in sodium chloride 0.9 % 100 mL IVPB     2 g 200 mL/hr over 30 Minutes Intravenous Every 24 hours 02/22/19 1703 02/27/19 1714   02/22/19 1715  azithromycin (ZITHROMAX) 500 mg in sodium chloride 0.9 % 250 mL IVPB     500 mg 250 mL/hr over 60 Minutes Intravenous Every 24 hours 02/22/19 1703 02/27/19 1714   02/17/19 1702  ceFAZolin (ANCEF) 2-4 GM/100ML-% IVPB    Note to Pharmacy: Maudry Diego  : cabinet override      02/17/19 1702 02/18/19 0514   02/15/19 1045  cefoTEtan (CEFOTAN) 2 g in sodium chloride 0.9 % 100 mL IVPB     2 g 200 mL/hr over 30  Minutes Intravenous On call to O.R. 02/15/19 1030 02/15/19 1257      Medications: Scheduled Meds: . sodium chloride   Intravenous Once  . acetaminophen  1,000 mg Oral Q8H   Or  . acetaminophen (TYLENOL) oral liquid 160 mg/5 mL  1,000 mg Oral Q8H  . feeding supplement (PRO-STAT SUGAR FREE 64)  30 mL Oral QID  . furosemide  40 mg Intravenous Once  . gabapentin  200 mg Oral Q12H  . Ensure Max Protein  2 oz Oral Q2H   Continuous Infusions: . azithromycin    . cefTRIAXone (ROCEPHIN)  IV    . famotidine (PEPCID) IV 20 mg (02/22/19 1042)   PRN Meds:.ALPRAZolam, hydrALAZINE, morphine injection, ondansetron (ZOFRAN) IV, oxyCODONE, simethicone  Assessment/Plan: Patient Active Problem List   Diagnosis Date Noted  . Obesity 02/15/2019  . Pneumonia 11/19/2016  . Back pain 11/19/2016  . Constipation 04/27/2012  . GASTROESOPHAGEAL REFLUX DISEASE 06/26/2009  . SARCOIDOSIS, PULMONARY 06/23/2009  . PNEUMONIA 06/23/2009  . CHEST PAIN 06/23/2009   s/p Procedure(s): LAPAROSCOPY DIAGNOSTIC CONVERTED TO OPEN, EVACUATION OF HEMATOMA AND LIGATION OF VESSEL 02/17/2019 -lasix given earlier today with good response and some pulm improvement. Will give one more dose tonight -high fever and XR with concern for infiltrate, will start abx empirically -continue chemical VTE prophylaxis  Disposition:  LOS: 7 days  The patient does not meet criteria for discharge because She has a hemoglobin  drop and will be watched for signs of bleeding  Rodman PickleLuke Aaron Michal Strzelecki, MD [phone] [facility]

## 2019-02-22 NOTE — Progress Notes (Signed)
Nutrition Brief Note  RD spoke with bariatric nurse coordinator regarding patient's lactose intolerance. Pt has not tolerated Ensure Max or Unjury chicken soup supplements well.  Pt would benefit from plant based options. No plant based protein supplement is available on formulary that meets bariatric criteria.   Patient expected to discharge tomorrow. RD will order Prostat supplements to help meet protein needs.   Clayton Bibles, MS, RD, [place] [facility] Pager: [phone] After Hours Pager: [phone]

## 2019-02-22 NOTE — Progress Notes (Signed)
Patient has temperature of 102.4. Dr. Kieth Brightly notified and he is awaiting results of CXR. Mobilizing patient, encouraging use of Incentive Spirometer and Tylenol is scheduled. Donne Hazel, RN

## 2019-02-22 NOTE — Progress Notes (Signed)
Patient alert and oriented.  Provided support and encouragement.  Encouraged pulmonary toilet, ambulation. (IS 1000).  Dressing dry and intact. Tolerating Unjury Chicken Soup protein and clear protein. All questions answered.  Will continue to monitor.

## 2019-02-22 NOTE — Progress Notes (Signed)
SATURATION QUALIFICATIONS: (This note is used to comply with regulatory documentation for home oxygen)  Patient Saturations on Room Air at Rest = 92%  Patient Saturations on Room Air while Ambulating = 80-87%   Please briefly explain why patient needs home oxygen: Desaturation

## 2019-02-23 ENCOUNTER — Inpatient Hospital Stay (HOSPITAL_COMMUNITY): Payer: Medicare Other

## 2019-02-23 LAB — CBC
HCT: 25.4 % — ABNORMAL LOW (ref 36.0–46.0)
Hemoglobin: 8.3 g/dL — ABNORMAL LOW (ref 12.0–15.0)
MCH: 29.3 pg (ref 26.0–34.0)
MCHC: 32.7 g/dL (ref 30.0–36.0)
MCV: 89.8 fL (ref 80.0–100.0)
Platelets: 287 10*3/uL (ref 150–400)
RBC: 2.83 MIL/uL — ABNORMAL LOW (ref 3.87–5.11)
RDW: 14.5 % (ref 11.5–15.5)
WBC: 11.8 10*3/uL — ABNORMAL HIGH (ref 4.0–10.5)
nRBC: 0.2 % (ref 0.0–0.2)

## 2019-02-23 MED ORDER — AZITHROMYCIN 500 MG PO TABS: 500.0000 mg | ORAL_TABLET | Freq: Every day | ORAL | 0 refills | Status: DC

## 2019-02-23 MED ORDER — IOHEXOL 350 MG/ML SOLN
100.0000 mL | Freq: Once | INTRAVENOUS | Status: AC | PRN
  Administered 2019-02-23: 100 mL via INTRAVENOUS

## 2019-02-23 MED ORDER — SODIUM CHLORIDE (PF) 0.9 % IJ SOLN
INTRAMUSCULAR | Status: AC
  Filled 2019-02-23: qty 50

## 2019-02-23 NOTE — Progress Notes (Signed)
Discharge and medication instructions reviewed with patient. Questions answered and patient denies further questions. No prescriptions given. Family member en route to drive patient home. Carlton Buskey, RN 

## 2019-02-23 NOTE — Progress Notes (Signed)
   Progress Note: Metabolic and Bariatric Surgery Service   Chief Complaint/Subjective: No complaints, wants to go home  Objective: Vital signs in last 24 hours: Temp:  [97.8 F (36.6 C)-100.4 F (38 C)] 100.4 F (38 C) (07/14 0403) Pulse Rate:  [81-88] 88 (07/14 0403) Resp:  [18-20] 18 (07/14 0403) BP: (111-135)/(67-82) 135/71 (07/14 0403) SpO2:  [87 %-95 %] 91 % (07/14 0819) Last BM Date: 02/23/19  Intake/Output from previous day: 07/13 0701 - 07/14 0700 In: 1784.8 [P.O.:480; I.V.:822; IV Piggyback:482.8] Out: 2050 [Urine:2050] Intake/Output this shift: No intake/output data recorded.  Lungs: nonlabored  Cardiovascular: RRR  Abd: soft, ATTP, incisions c/d/i  Extremities: trace edema  Neuro: AOx4  Lab Results: CBC  Recent Labs    02/22/19 0344 02/23/19 0428  WBC 12.9* 11.8*  HGB 8.4* 8.3*  HCT 25.6* 25.4*  PLT 247 287   BMET No results for input(s): NA, K, CL, CO2, GLUCOSE, BUN, CREATININE, CALCIUM in the last 72 hours. PT/INR No results for input(s): LABPROT, INR in the last 72 hours. ABG No results for input(s): PHART, HCO3 in the last 72 hours.  Invalid input(s): PCO2, PO2  Studies/Results:  Anti-infectives: Anti-infectives (From admission, onward)   Start     Dose/Rate Route Frequency Ordered Stop   02/22/19 1800  cefTRIAXone (ROCEPHIN) 2 g in sodium chloride 0.9 % 100 mL IVPB     2 g 200 mL/hr over 30 Minutes Intravenous Every 24 hours 02/22/19 1703 02/27/19 1759   02/22/19 1800  azithromycin (ZITHROMAX) 500 mg in sodium chloride 0.9 % 250 mL IVPB     500 mg 250 mL/hr over 60 Minutes Intravenous Every 24 hours 02/22/19 1703 02/27/19 1759   02/17/19 1702  ceFAZolin (ANCEF) 2-4 GM/100ML-% IVPB    Note to Pharmacy: Maudry Diego  : cabinet override      02/17/19 1702 02/18/19 0514   02/15/19 1045  cefoTEtan (CEFOTAN) 2 g in sodium chloride 0.9 % 100 mL IVPB     2 g 200 mL/hr over 30 Minutes Intravenous On call to O.R. 02/15/19 1030  02/15/19 1257      Medications: Scheduled Meds: . sodium chloride   Intravenous Once  . acetaminophen  1,000 mg Oral Q8H   Or  . acetaminophen (TYLENOL) oral liquid 160 mg/5 mL  1,000 mg Oral Q8H  . feeding supplement (PRO-STAT SUGAR FREE 64)  30 mL Oral QID  . gabapentin  200 mg Oral Q12H  . Ensure Max Protein  2 oz Oral Q2H   Continuous Infusions: . azithromycin 500 mg (02/22/19 1808)  . cefTRIAXone (ROCEPHIN)  IV 2 g (02/22/19 1801)  . famotidine (PEPCID) IV 20 mg (02/22/19 2145)   PRN Meds:.ALPRAZolam, hydrALAZINE, morphine injection, ondansetron (ZOFRAN) IV, oxyCODONE, simethicone  Assessment/Plan: Patient Active Problem List   Diagnosis Date Noted  . Obesity 02/15/2019  . Pneumonia 11/19/2016  . Back pain 11/19/2016  . Constipation 04/27/2012  . GASTROESOPHAGEAL REFLUX DISEASE 06/26/2009  . SARCOIDOSIS, PULMONARY 06/23/2009  . PNEUMONIA 06/23/2009  . CHEST PAIN 06/23/2009   s/p Procedure(s): LAPAROSCOPY DIAGNOSTIC CONVERTED TO OPEN, EVACUATION OF HEMATOMA AND LIGATION OF VESSEL 02/17/2019 -partial response to lasix but still o2 in low 90s at rest and desaturates on ambulation -Ct PE protocol today -continue abx -continue bari liquids  Disposition:  LOS: 8 days    Mickeal Skinner, MD [phone] [facility]

## 2019-02-23 NOTE — Progress Notes (Signed)
Patient alert and oriented, pain is controlled. Patient is tolerating fluids, advanced to protein shake today, patient is tolerating well.  Reviewed Gastric sleeve discharge instructions with patient and patient is able to articulate understanding.  Provided information on BELT program, Support Group and [facility] outpatient pharmacy.  Reviewed Lovenox instructions and follow up with pulmonology.  All questions answered, will continue to monitor.

## 2019-02-23 NOTE — Progress Notes (Signed)
Patient alert and oriented.  Provided support and encouragement.  Encouraged pulmonary toilet, ambulation and small sips of liquids.  Prostat started yesterday patient stated she tolerated ok, but was "sweet".  Reviewed Lovenox education with patient utilizing Lovenox teaching kit.  All questions answered.  Will continue to monitor.

## 2019-02-24 ENCOUNTER — Inpatient Hospital Stay (HOSPITAL_COMMUNITY)
Admission: EM | Admit: 2019-02-24 | Discharge: 2019-03-02 | DRG: 392 | Disposition: A | Discharge status: Home Health | Payer: Medicare Other | Attending: Internal Medicine | Admitting: Internal Medicine

## 2019-02-24 ENCOUNTER — Emergency Department (HOSPITAL_COMMUNITY): Payer: Medicare Other

## 2019-02-24 ENCOUNTER — Encounter (HOSPITAL_COMMUNITY): Payer: Self-pay | Admitting: Emergency Medicine

## 2019-02-24 ENCOUNTER — Other Ambulatory Visit: Payer: Self-pay

## 2019-02-24 DIAGNOSIS — Z79899 Other long term (current) drug therapy: Secondary | ICD-10-CM

## 2019-02-24 DIAGNOSIS — K529 Noninfective gastroenteritis and colitis, unspecified: Secondary | ICD-10-CM | POA: Diagnosis not present

## 2019-02-24 DIAGNOSIS — F419 Anxiety disorder, unspecified: Secondary | ICD-10-CM | POA: Diagnosis not present

## 2019-02-24 DIAGNOSIS — E876 Hypokalemia: Secondary | ICD-10-CM | POA: Diagnosis present

## 2019-02-24 DIAGNOSIS — Z886 Allergy status to analgesic agent status: Secondary | ICD-10-CM | POA: Diagnosis not present

## 2019-02-24 DIAGNOSIS — Z1159 Encounter for screening for other viral diseases: Secondary | ICD-10-CM

## 2019-02-24 DIAGNOSIS — Z20828 Contact with and (suspected) exposure to other viral communicable diseases: Secondary | ICD-10-CM | POA: Diagnosis not present

## 2019-02-24 DIAGNOSIS — R112 Nausea with vomiting, unspecified: Secondary | ICD-10-CM | POA: Diagnosis present

## 2019-02-24 DIAGNOSIS — K219 Gastro-esophageal reflux disease without esophagitis: Secondary | ICD-10-CM | POA: Diagnosis not present

## 2019-02-24 DIAGNOSIS — Z6841 Body Mass Index (BMI) 40.0 and over, adult: Secondary | ICD-10-CM | POA: Diagnosis not present

## 2019-02-24 DIAGNOSIS — E7800 Pure hypercholesterolemia, unspecified: Secondary | ICD-10-CM | POA: Diagnosis present

## 2019-02-24 DIAGNOSIS — Z9889 Other specified postprocedural states: Secondary | ICD-10-CM

## 2019-02-24 DIAGNOSIS — Z87891 Personal history of nicotine dependence: Secondary | ICD-10-CM

## 2019-02-24 DIAGNOSIS — E669 Obesity, unspecified: Secondary | ICD-10-CM | POA: Diagnosis not present

## 2019-02-24 DIAGNOSIS — I10 Essential (primary) hypertension: Secondary | ICD-10-CM | POA: Diagnosis not present

## 2019-02-24 DIAGNOSIS — Z79891 Long term (current) use of opiate analgesic: Secondary | ICD-10-CM | POA: Diagnosis not present

## 2019-02-24 DIAGNOSIS — Z209 Contact with and (suspected) exposure to unspecified communicable disease: Secondary | ICD-10-CM | POA: Diagnosis not present

## 2019-02-24 DIAGNOSIS — D860 Sarcoidosis of lung: Secondary | ICD-10-CM | POA: Diagnosis not present

## 2019-02-24 DIAGNOSIS — R140 Abdominal distension (gaseous): Secondary | ICD-10-CM | POA: Diagnosis not present

## 2019-02-24 DIAGNOSIS — R58 Hemorrhage, not elsewhere classified: Secondary | ICD-10-CM | POA: Diagnosis not present

## 2019-02-24 DIAGNOSIS — R0602 Shortness of breath: Secondary | ICD-10-CM | POA: Diagnosis not present

## 2019-02-24 DIAGNOSIS — Z8249 Family history of ischemic heart disease and other diseases of the circulatory system: Secondary | ICD-10-CM | POA: Diagnosis not present

## 2019-02-24 DIAGNOSIS — R1084 Generalized abdominal pain: Secondary | ICD-10-CM

## 2019-02-24 DIAGNOSIS — R197 Diarrhea, unspecified: Secondary | ICD-10-CM | POA: Diagnosis not present

## 2019-02-24 DIAGNOSIS — Z9884 Bariatric surgery status: Secondary | ICD-10-CM

## 2019-02-24 DIAGNOSIS — R0689 Other abnormalities of breathing: Secondary | ICD-10-CM | POA: Diagnosis not present

## 2019-02-24 LAB — CBC WITH DIFFERENTIAL/PLATELET
Abs Immature Granulocytes: 0.46 10*3/uL — ABNORMAL HIGH (ref 0.00–0.07)
Basophils Absolute: 0 10*3/uL (ref 0.0–0.1)
Basophils Relative: 0 %
Eosinophils Absolute: 0 10*3/uL (ref 0.0–0.5)
Eosinophils Relative: 0 %
HCT: 27.8 % — ABNORMAL LOW (ref 36.0–46.0)
Hemoglobin: 9 g/dL — ABNORMAL LOW (ref 12.0–15.0)
Immature Granulocytes: 3 %
Lymphocytes Relative: 6 %
Lymphs Abs: 0.8 10*3/uL (ref 0.7–4.0)
MCH: 28.8 pg (ref 26.0–34.0)
MCHC: 32.4 g/dL (ref 30.0–36.0)
MCV: 89.1 fL (ref 80.0–100.0)
Monocytes Absolute: 0.9 10*3/uL (ref 0.1–1.0)
Monocytes Relative: 6 %
Neutro Abs: 12.4 10*3/uL — ABNORMAL HIGH (ref 1.7–7.7)
Neutrophils Relative %: 85 %
Platelets: 409 10*3/uL — ABNORMAL HIGH (ref 150–400)
RBC: 3.12 MIL/uL — ABNORMAL LOW (ref 3.87–5.11)
RDW: 14.8 % (ref 11.5–15.5)
WBC: 14.7 10*3/uL — ABNORMAL HIGH (ref 4.0–10.5)
nRBC: 0.1 % (ref 0.0–0.2)

## 2019-02-24 LAB — COMPREHENSIVE METABOLIC PANEL
ALT: 45 U/L — ABNORMAL HIGH (ref 0–44)
AST: 22 U/L (ref 15–41)
Albumin: 2.5 g/dL — ABNORMAL LOW (ref 3.5–5.0)
Alkaline Phosphatase: 77 U/L (ref 38–126)
Anion gap: 12 (ref 5–15)
BUN: <5 mg/dL — ABNORMAL LOW (ref 6–20)
CO2: 26 mmol/L (ref 22–32)
Calcium: 8.2 mg/dL — ABNORMAL LOW (ref 8.9–10.3)
Chloride: 104 mmol/L (ref 98–111)
Creatinine, Ser: 0.64 mg/dL (ref 0.44–1.00)
GFR calc Af Amer: >60 mL/min (ref >60)
GFR calc non Af Amer: >60 mL/min (ref >60)
Glucose, Bld: 107 mg/dL — ABNORMAL HIGH (ref 70–99)
Potassium: 2.5 mmol/L — CL (ref 3.5–5.1)
Sodium: 142 mmol/L (ref 135–145)
Total Bilirubin: 0.6 mg/dL (ref 0.3–1.2)
Total Protein: 7.2 g/dL (ref 6.5–8.1)

## 2019-02-24 LAB — TROPONIN I (HIGH SENSITIVITY)
Troponin I (High Sensitivity): 5 ng/L (ref <18)
Troponin I (High Sensitivity): 6 ng/L (ref <18)

## 2019-02-24 LAB — SARS CORONAVIRUS 2 BY RT PCR (HOSPITAL ORDER, PERFORMED IN ~~LOC~~ HOSPITAL LAB): SARS Coronavirus 2: NEGATIVE

## 2019-02-24 LAB — TYPE AND SCREEN
ABO/RH(D): O POS
Antibody Screen: NEGATIVE

## 2019-02-24 LAB — MAGNESIUM: Magnesium: 1.8 mg/dL (ref 1.7–2.4)

## 2019-02-24 LAB — ABO/RH: ABO/RH(D): O POS

## 2019-02-24 LAB — BRAIN NATRIURETIC PEPTIDE: B Natriuretic Peptide: 81 pg/mL (ref 0.0–100.0)

## 2019-02-24 LAB — LIPASE, BLOOD: Lipase: 33 U/L (ref 11–51)

## 2019-02-24 MED ORDER — POTASSIUM CHLORIDE 10 MEQ/100ML IV SOLN
10.0000 meq | INTRAVENOUS | Status: AC
  Administered 2019-02-24 (×4): 10 meq via INTRAVENOUS
  Filled 2019-02-24 (×4): qty 100

## 2019-02-24 MED ORDER — IOHEXOL 300 MG/ML  SOLN
100.0000 mL | Freq: Once | INTRAMUSCULAR | Status: AC | PRN
  Administered 2019-02-24: 100 mL via INTRAVENOUS

## 2019-02-24 MED ORDER — ONDANSETRON HCL 4 MG/2ML IJ SOLN
4.0000 mg | Freq: Once | INTRAMUSCULAR | Status: AC
  Administered 2019-02-24: 4 mg via INTRAVENOUS
  Filled 2019-02-24: qty 2

## 2019-02-24 MED ORDER — SODIUM CHLORIDE 0.9 % IV SOLN
500.0000 mg | INTRAVENOUS | Status: AC
  Administered 2019-02-24 – 2019-02-26 (×3): 500 mg via INTRAVENOUS
  Filled 2019-02-24 (×3): qty 500

## 2019-02-24 MED ORDER — ENOXAPARIN SODIUM 40 MG/0.4ML ~~LOC~~ SOLN
40.0000 mg | SUBCUTANEOUS | Status: DC
  Administered 2019-02-24 – 2019-03-02 (×7): 40 mg via SUBCUTANEOUS
  Filled 2019-02-24 (×7): qty 0.4

## 2019-02-24 MED ORDER — ONDANSETRON HCL 4 MG/2ML IJ SOLN
4.0000 mg | Freq: Once | INTRAMUSCULAR | Status: AC
  Administered 2019-02-24: 03:00:00 4 mg via INTRAVENOUS
  Filled 2019-02-24: qty 2

## 2019-02-24 MED ORDER — SODIUM CHLORIDE 0.9 % IV BOLUS
500.0000 mL | Freq: Once | INTRAVENOUS | Status: AC
  Administered 2019-02-24: 03:00:00 500 mL via INTRAVENOUS

## 2019-02-24 MED ORDER — HYDROMORPHONE HCL 1 MG/ML IJ SOLN
1.0000 mg | Freq: Once | INTRAMUSCULAR | Status: AC
  Administered 2019-02-24: 1 mg via INTRAVENOUS
  Filled 2019-02-24: qty 1

## 2019-02-24 MED ORDER — ONDANSETRON HCL 4 MG PO TABS: 4.0000 mg | ORAL_TABLET | Freq: Four times a day (QID) | ORAL | Status: DC | PRN

## 2019-02-24 MED ORDER — ONDANSETRON HCL 4 MG/2ML IJ SOLN: 4.0000 mg | Freq: Four times a day (QID) | INTRAMUSCULAR | Status: DC | PRN

## 2019-02-24 MED ORDER — SODIUM CHLORIDE 0.9 % IV SOLN
1.0000 g | INTRAVENOUS | Status: AC
  Administered 2019-02-24 – 2019-02-26 (×3): 1 g via INTRAVENOUS
  Filled 2019-02-24 (×3): qty 10

## 2019-02-24 MED ORDER — LACTATED RINGERS IV SOLN
INTRAVENOUS | Status: AC
  Administered 2019-02-24: 1000 mL via INTRAVENOUS
  Administered 2019-02-24: 10:00:00 via INTRAVENOUS

## 2019-02-24 MED ORDER — POTASSIUM CHLORIDE 10 MEQ/100ML IV SOLN
10.0000 meq | INTRAVENOUS | Status: AC
  Administered 2019-02-24: 10 meq via INTRAVENOUS
  Filled 2019-02-24 (×2): qty 100

## 2019-02-24 MED ORDER — ACETAMINOPHEN 650 MG RE SUPP: 650.0000 mg | Freq: Four times a day (QID) | RECTAL | Status: DC | PRN

## 2019-02-24 MED ORDER — KETOROLAC TROMETHAMINE 30 MG/ML IJ SOLN
30.0000 mg | Freq: Four times a day (QID) | INTRAMUSCULAR | Status: AC | PRN
  Administered 2019-02-24 – 2019-02-27 (×5): 30 mg via INTRAVENOUS
  Filled 2019-02-24 (×5): qty 1

## 2019-02-24 MED ORDER — HYDROMORPHONE HCL 1 MG/ML IJ SOLN
0.5000 mg | INTRAMUSCULAR | Status: DC | PRN
  Administered 2019-02-24 – 2019-03-01 (×39): 0.5 mg via INTRAVENOUS
  Filled 2019-02-24 (×21): qty 0.5
  Filled 2019-02-24: qty 1
  Filled 2019-02-24 (×17): qty 0.5
  Filled 2019-02-24: qty 1

## 2019-02-24 MED ORDER — ACETAMINOPHEN 325 MG PO TABS: 650.0000 mg | ORAL_TABLET | Freq: Four times a day (QID) | ORAL | Status: DC | PRN

## 2019-02-24 NOTE — ED Notes (Signed)
Attempted report to 5W nurse. Nurse unable to take report at this time.

## 2019-02-24 NOTE — ED Provider Notes (Addendum)
[facility] Provider Note   CSN: [id number] Arrival date & time: 02/24/19  0250    History   Chief Complaint Chief Complaint  Patient presents with   Emesis    HPI [name] is a 58 y.o. female.     Patient presents to the emergency department for evaluation of shortness of breath, nausea and vomiting with abdominal pain.  Patient had gastric sleeve performed on July 8.  She was noted to have a drop in her hemoglobin postoperatively, went back to the OR and found to have a hematoma with a bleeding blood vessel which was ligated.  Patient also noted to have bilateral lung opacities on CT that were either atelectasis or multifocal pneumonia.  She was started on antibiotics and discharged.  Patient reports that she had been feeling short of breath all day today.  She went to sleep tonight, woke up feeling suddenly nauseated and vomited.  There appeared to be some blood in her vomit.  She also noted bleeding from her surgical incision.     Past Medical History:  Diagnosis Date   Chest pain    GERD (gastroesophageal reflux disease)    Hypercholesterolemia    Hypertension    Pneumonia    Pulmonary sarcoidosis (HCC)    Treatment with Prednisone    Patient Active Problem List   Diagnosis Date Noted   Obesity 02/15/2019   Pneumonia 11/19/2016   Back pain 11/19/2016   Constipation 04/27/2012   GASTROESOPHAGEAL REFLUX DISEASE 06/26/2009   SARCOIDOSIS, PULMONARY 06/23/2009   PNEUMONIA 06/23/2009   CHEST PAIN 06/23/2009    Past Surgical History:  Procedure Laterality Date   ABDOMINAL HYSTERECTOMY     APPENDECTOMY     CHOLECYSTECTOMY     LAPAROSCOPIC GASTRIC SLEEVE RESECTION N/A 02/15/2019   Procedure: LAPAROSCOPIC GASTRIC SLEEVE RESECTION, UPPER ENDO, ERAS Pathway, Hiatal Hernia Repair;  Surgeon: Kinsinger, De BlanchLuke Aaron, MD;  Location: [facility];  Service: General;  Laterality: N/A;   LAPAROSCOPY N/A 02/17/2019   Procedure: LAPAROSCOPY  DIAGNOSTIC CONVERTED TO OPEN, EVACUATION OF HEMATOMA AND LIGATION OF VESSEL;  Surgeon: Kinsinger, De BlanchLuke Aaron, MD;  Location: [facility];  Service: General;  Laterality: N/A;     OB History   No obstetric history on file.      Home Medications    Prior to Admission medications   Medication Sig Start Date End Date Taking? Authorizing Provider  acetaminophen (TYLENOL) 500 MG tablet Take 2 tablets (1,000 mg total) by mouth every 8 (eight) hours for 5 days. 02/22/19 02/27/19  Kinsinger, De BlanchLuke Aaron, MD  ALPRAZolam Prudy Feeler(XANAX) 0.5 MG tablet Take 0.5 mg by mouth 2 (two) times daily as needed for anxiety or sleep.     [provider]  amLODipine (NORVASC) 5 MG tablet Take 5 mg by mouth daily.    [provider]  azithromycin (ZITHROMAX) 500 MG tablet Take 1 tablet (500 mg total) by mouth daily. 02/23/19   Kinsinger, De BlanchLuke Aaron, MD  calcium carbonate (TUMS - DOSED IN MG ELEMENTAL CALCIUM) 500 MG chewable tablet Chew 1 tablet by mouth daily as needed for indigestion or heartburn.    [provider]  cyclobenzaprine (FLEXERIL) 10 MG tablet Take 10 mg by mouth 2 (two) times daily as needed for muscle spasms.     [provider]  enoxaparin (LOVENOX) 40 MG/0.4ML injection Inject 0.4 mLs (40 mg total) into the skin daily for 14 doses. 02/22/19 03/08/19  Kinsinger, De BlanchLuke Aaron, MD  gabapentin (NEURONTIN) 100 MG capsule Take  2 capsules (200 mg total) by mouth every 12 (twelve) hours. 02/22/19   Kinsinger, De BlanchLuke Aaron, MD  Multiple Vitamins-Minerals (WOMENS 50+ MULTI VITAMIN/MIN PO) Take 1 tablet by mouth daily.    [provider]  ondansetron (ZOFRAN-ODT) 4 MG disintegrating tablet Take 1 tablet (4 mg total) by mouth every 6 (six) hours as needed for nausea or vomiting. 02/22/19   Kinsinger, De BlanchLuke Aaron, MD  oxyCODONE (OXY IR/ROXICODONE) 5 MG immediate release tablet Take 1 tablet (5 mg total) by mouth every 6 (six) hours as needed for severe pain. 02/22/19   Kinsinger, De BlanchLuke Aaron, MD   pantoprazole (PROTONIX) 40 MG tablet Take 1 tablet (40 mg total) by mouth daily. 02/22/19   Kinsinger, De BlanchLuke Aaron, MD    Family History Family History  Problem Relation Age of Onset   Hypertension Mother    Lupus Father    Colon cancer Neg Hx     Social History Social History   Tobacco Use   Smoking status: Former Smoker    Packs/day: 0.20    Years: 30.00    Pack years: 6.00    Types: Cigarettes    Quit date: 11/19/2012    Years since quitting: 6.2   Smokeless tobacco: Never Used  Substance Use Topics   Alcohol use: No   Drug use: No     Allergies   Aspirin   Review of Systems Review of Systems  Respiratory: Positive for shortness of breath.   Gastrointestinal: Positive for abdominal pain.  All other systems reviewed and are negative.    Physical Exam Updated Vital Signs BP 133/87    Pulse 77    Temp 98.7 F (37.1 C) (Oral)    Resp (!) 24    SpO2 97%   Physical Exam Vitals signs and nursing note reviewed.  Constitutional:      General: She is not in acute distress.    Appearance: Normal appearance. She is well-developed.  HENT:     Head: Normocephalic and atraumatic.     Right Ear: Hearing normal.     Left Ear: Hearing normal.     Nose: Nose normal.  Eyes:     Conjunctiva/sclera: Conjunctivae normal.     Pupils: Pupils are equal, round, and reactive to light.  Neck:     Musculoskeletal: Normal range of motion and neck supple.  Cardiovascular:     Rate and Rhythm: Regular rhythm.     Heart sounds: S1 normal and S2 normal. No murmur. No friction rub. No gallop.   Pulmonary:     Effort: Pulmonary effort is normal. Tachypnea present. No respiratory distress.     Breath sounds: Normal breath sounds.  Chest:     Chest wall: No tenderness.  Abdominal:     General: Bowel sounds are decreased. There is distension.     Palpations: Abdomen is soft.     Tenderness: There is generalized abdominal tenderness. There is no guarding or rebound. Negative  signs include Murphy's sign and McBurney's sign.     Hernia: No hernia is present.  Musculoskeletal: Normal range of motion.  Skin:    General: Skin is warm and dry.     Findings: No rash.     Comments: Midline surgical incision with staples intact.  Small amount of blood oozing from surgical line.  Clear fluid also oozing slowly from surgical site.  Neurological:     Mental Status: She is alert and oriented to person, place, and time.     GCS: GCS  eye subscore is 4. GCS verbal subscore is 5. GCS motor subscore is 6.     Cranial Nerves: No cranial nerve deficit.     Sensory: No sensory deficit.     Coordination: Coordination normal.  Psychiatric:        Speech: Speech normal.        Behavior: Behavior normal.        Thought Content: Thought content normal.      ED Treatments / Results  Labs (all labs ordered are listed, but only abnormal results are displayed) Labs Reviewed  CBC WITH DIFFERENTIAL/PLATELET - Abnormal; Notable for the following components:      Result Value   WBC 14.7 (*)    RBC 3.12 (*)    Hemoglobin 9.0 (*)    HCT 27.8 (*)    Platelets 409 (*)    Neutro Abs 12.4 (*)    Abs Immature Granulocytes 0.46 (*)    All other components within normal limits  COMPREHENSIVE METABOLIC PANEL - Abnormal; Notable for the following components:   Potassium 2.5 (*)    Glucose, Bld 107 (*)    BUN <5 (*)    Calcium 8.2 (*)    Albumin 2.5 (*)    ALT 45 (*)    All other components within normal limits  SARS CORONAVIRUS 2 (HOSPITAL ORDER, PERFORMED IN [facility])  BRAIN NATRIURETIC PEPTIDE  LIPASE, BLOOD  URINALYSIS, ROUTINE W REFLEX MICROSCOPIC  TYPE AND SCREEN  ABO/RH  TROPONIN I (HIGH SENSITIVITY)  TROPONIN I (HIGH SENSITIVITY)    EKG None  Radiology Ct Angio Chest Pe W Or Wo Contrast  Result Date: 02/23/2019 CLINICAL DATA:  Hypoxemia. Respiratory failure. Recent lap band procedure. Evaluate for pulmonary emboli. EXAM: CT ANGIOGRAPHY CHEST WITH  CONTRAST TECHNIQUE: Multidetector CT imaging of the chest was performed using the standard protocol during bolus administration of intravenous contrast. Multiplanar CT image reconstructions and MIPs were obtained to evaluate the vascular anatomy. CONTRAST:  OMNIPAQUE IOHEXOL 350 MG/ML SOLN COMPARISON:  Chest are by 13 2020 FINDINGS: Cardiovascular: The thoracic aorta demonstrates minimal atherosclerotic change. No aneurysm or dissection identified. Cardiomegaly is noted. Respiratory motion limits evaluation of pulmonary arteries. No definite pulmonary emboli identified within these limitations. Mediastinum/Nodes: The thyroid and esophagus are normal. There is a tiny left pleural effusion. No pericardial effusion or right pleural effusion. No adenopathy. Lungs/Pleura: Central airways are normal. No pneumothorax. Opacities in the lungs are most prominent in the right middle lobe and bilateral lower lobes. More mild opacities are seen in the upper lobes towards the apices. There is a nodule in the left apex measuring up to 8 by 8 by 5 mm with a mean diameter 7 mm. The scattered opacities limit evaluation for other nodules. No other definitive nodules are identified. No other infiltrates or masses. Upper Abdomen: Postoperative changes are seen in the upper abdomen. There is fluid in the abdominal wall deep to the skin staples. A few foci of air are seen in the fluid as well. No other acute abnormalities. Musculoskeletal: No chest wall abnormality. No acute or significant osseous findings. Review of the MIP images confirms the above findings. IMPRESSION: 1. Evaluation for pulmonary emboli is limited due to respiratory motion. No central emboli identified. 2. Bilateral pulmonary opacities most prominent the right middle lobe and bases. These findings could represent significant atelectasis versus multifocal pneumonia. Recommend clinical correlation. 3. 7 mm nodule in the left apex. Whether this is a true nodule or  part of  the streaky opacities in the apices is unclear. Non-contrast chest CT at 6-12 months is recommended. If the nodule is stable at time of repeat CT, then future CT at 18-24 months (from today's scan) is considered optional for low-risk patients, but is recommended for high-risk patients. This recommendation follows the consensus statement: Guidelines for Management of Incidental Pulmonary Nodules Detected on CT Images: From the Fleischner Society 2017; Radiology 2017; 284:228-243. 4. Postoperative changes are seen in the upper abdomen with fluid in the anterior abdominal wall deep to the skin staples. A few foci of air seen in the fluid which are nonspecific but could be postoperative. Recommend attention on follow-up. 5. Cardiomegaly. 6. Mild atherosclerotic changes in the thoracic aorta. 7. Tiny left pleural effusion. Aortic Atherosclerosis (ICD10-I70.0). Electronically Signed   By: Dorise Bullion III M.D   On: 02/23/2019 11:39   Ct Abdomen Pelvis W Contrast  Result Date: 02/24/2019 CLINICAL DATA:  Emesis and abdominal pain. Recent gastric bypass surgery EXAM: CT ABDOMEN AND PELVIS WITH CONTRAST TECHNIQUE: Multidetector CT imaging of the abdomen and pelvis was performed using the standard protocol following bolus administration of intravenous contrast. CONTRAST:  125mL OMNIPAQUE IOHEXOL 300 MG/ML  SOLN COMPARISON:  02/17/2019 FINDINGS: Lower chest:  Dependent atelectasis.  Borderline heart size Hepatobiliary: No focal liver abnormality.Cholecystectomy. No bile duct dilatation Pancreas: Stranding adjacent to the pancreas is attributed to surgery. The pancreas itself does not appear expanded or edematous. Spleen: Unremarkable. Adrenals/Urinary Tract: Negative adrenals. No hydronephrosis or stone. Unremarkable bladder. Stomach/Bowel: Postoperative stomach. Small volume residual hematoma along the staple line. Thickened bowel loops diffusely with mild submucosal low-density edema. Increased fluid content  within the colon reaching the rectum. No pericecal inflammation. Vascular/Lymphatic: Moderate atherosclerotic calcification. Major vessels are patent. No mass or adenopathy. Reproductive:Hysterectomy. Other: No ascites or pneumoperitoneum.  Diastasis rectus. Musculoskeletal: No acute abnormalities. IMPRESSION: 1. Nonspecific enteritis with increased colonic fluid contents suggesting associated diarrhea. 2. Largely resolved hematoma about the operative site with no active hemorrhage. Electronically Signed   By: Monte Fantasia M.D.   On: 02/24/2019 05:58   Dg Chest Port 1 View  Result Date: 02/24/2019 CLINICAL DATA:  Shortness of breath EXAM: PORTABLE CHEST 1 VIEW COMPARISON:  02/22/2019, 09/04/2018 FINDINGS: Improved aeration since 02/22/2019. Platelike areas of atelectasis at the left base and right infrahilar lung. No pleural effusion. Mild cardiomegaly. No pneumothorax. IMPRESSION: Suspected subsegmental atelectasis in the right infrahilar lung and left base. Cardiomegaly without edema. Electronically Signed   By: Donavan Foil M.D.   On: 02/24/2019 03:28   Dg Chest Port 1 View  Result Date: 02/22/2019 CLINICAL DATA:  Acute shortness of breath today EXAM: PORTABLE CHEST 1 VIEW COMPARISON:  09/04/2018 FINDINGS: Cardiomegaly again noted. Bilateral LOWER lung opacities are present and may represent atelectasis and/or airspace disease. No definite pleural effusion. No pneumothorax or acute bony abnormality. IMPRESSION: Bilateral LOWER lung opacities which may represent atelectasis and/or airspace disease. Cardiomegaly. Electronically Signed   By: Margarette Canada M.D.   On: 02/22/2019 09:36    Procedures Procedures (including critical care time)  Medications Ordered in ED Medications  potassium chloride 10 mEq in 100 mL IVPB (has no administration in time range)  sodium chloride 0.9 % bolus 500 mL (0 mLs Intravenous Stopped 02/24/19 0552)  ondansetron (ZOFRAN) injection 4 mg (4 mg Intravenous Given  02/24/19 0302)  iohexol (OMNIPAQUE) 300 MG/ML solution 100 mL (100 mLs Intravenous Contrast Given 02/24/19 0516)  HYDROmorphone (DILAUDID) injection 1 mg (1 mg Intravenous Given 02/24/19 0608)  ondansetron (  ZOFRAN) injection 4 mg (4 mg Intravenous Given 02/24/19 0608)     Initial Impression / Assessment and Plan / ED Course  I have reviewed the triage vital signs and the nursing notes.  Pertinent labs & imaging results that were available during my care of the patient were reviewed by me and considered in my medical decision making (see chart for details).        Patient presents to the emergency department for evaluation of multiple problems.  Patient recently underwent gastric sleeve procedure which was complicated by hemoperitoneum secondary to bleeding blood vessel.  She required a second surgery to ligate the vessel and was given 3 units of blood.  Patient reports onset of nausea and vomiting tonight.  She has a small amount of blood coming from her surgical incision.  Examination here in the ER reveals that staples are intact, no active bleeding.  No sign of infection.  Patient also complains of shortness of breath.  Reviewing her records reveals that she had these complaints before she left the hospital yesterday.  She underwent CT angiography of chest yesterday that did not show any evidence of PE but did show bilateral opacities that were considered possible multifocal pneumonia.  She was started on antibiotics before discharge.  Patient continued to be short of breath at home and worsened tonight.  Chest x-ray does not show any worsening pneumonia but she was experiencing borderline hypoxia.  Room air oxygen saturation was 90% for EMS, improved on supplemental oxygen by nasal cannula.  Patient underwent CT abdomen and pelvis to further evaluate the surgical site as well as to evaluate for repeat bleeding.  No reaccumulation of hematoma.  No obvious acute pathology noted.  Addendum:  Discussed with Dr. Robb Matarrtiz, on-call for hospitalist service.  He agrees to admit the patient.  We discussed the possibility of sending the patient to [facility] where her original surgery was.  I discussed this with Dr. Magnus IvanBlackman, on-call for general surgery.  He informs me that Dr. Sheliah HatchKinsinger, who performed her surgeries, is on-call at [facility] this week, asked that the [facility] be [facility] for [facility] on the hospitalist service.  He will get message to Dr. Sheliah HatchKinsinger but hospitalist can reconsult surgery when the patient gets to [facility].  Final Clinical Impressions(s) / ED Diagnoses   Final diagnoses:  Generalized abdominal pain  Hypokalemia    ED Discharge Orders    None       Gilda CreasePollina, Pacey Willadsen J, MD 02/24/19 [id number]    Gilda CreasePollina, Prestina Raigoza J, MD 02/24/19 [phone]

## 2019-02-24 NOTE — ED Triage Notes (Signed)
Pt reports emesis and SOB after returning home from surgery today. Pt also reporting bleeding at the insertion site.

## 2019-02-24 NOTE — Progress Notes (Addendum)
Patient trasfered from [facility] to [phone] via [place]; alert and oriented x 4; complaints of mid abdominal pain (8/10); IV saline locked in RAC and LAC; skin - surgical incision - mid abdomen. Orient patient to room and unit; gave patient care guide; instructed how to use the call bell and  fall risk precautions. Will continue to monitor the patient.

## 2019-02-24 NOTE — Progress Notes (Signed)
Writer offered to call patient's family for updates on her condition. Patient said that she already talked with her family and I don't need to call them. Will continue to monitor.

## 2019-02-24 NOTE — H&P (Signed)
History and Physical    Allison RaringBarbara Chase ZHY:[id number] DOB: [birth year] DOA: 02/24/2019  PCP: Allison BaarsHawkins, Edward, [name]   Patient coming from: Home  Chief Complaint: Abd pain/N/V and dyspnea  HPI: Allison RaringBarbara Chase is a 58 y.o. female with medical history significant for obesity with gastric sleeve surgery performed on 7/8, recent multifocal pneumonia during hospitalization, GERD, obesity, hypertension, and anxiety who presented to the emergency department after discharge just yesterday on 7/14 after hospitalization for gastric sleeve surgery with complication of hemoperitoneum secondary to bleeding blood vessel which was ligated and she required 3 units of PRBCs.  She was noted to have stabilization following the procedure, but unfortunately developed worsening shortness of breath and underwent CT scan of her chest that did not demonstrate any pulmonary embolus, however she was noted to have multifocal pneumonia and was started on IV antibiotics.  She was subsequently discharged as she was feeling much better and less short of breath.  She states that she continued to feel short of breath all day after discharge and went to sleep overnight and awoke with sudden nausea and vomiting with some mild hematemesis noted.  She was also noted to have some bleeding from her surgical incision site.  She started to have some shortness of breath, but denies any chest pain, coughing, fevers, or chills.   ED Course: Vital signs are stable and patient is afebrile.  Room air oxygen saturation was 90% via EMS report.  Chest x-ray does not show any worsening pneumonia, but there is some dependent atelectasis.  CT of the abdomen and pelvis with no reaccumulation of hematoma or acute pathology noted.  There are findings of nonspecific enteritis, however.  She is noted to have ongoing leukocytosis of 14,700 which is fairly stable from previous.  Hemoglobin is stable at 9.  Potassium is significantly low at 2.5.  EDP had spoken with  Dr. Magnus Chase who was on-call for general surgery and agrees that patient should transfer to [facility] where her surgeon Dr. Sheliah Chase is on call and could see the patient for reevaluation.  [facility].  Review of Systems: All others reviewed and otherwise negative.  Past Medical History:  Diagnosis Date   Chest pain    GERD (gastroesophageal reflux disease)    Hypercholesterolemia    Hypertension    Pneumonia    Postoperative nausea and vomiting 02/24/2019   Pulmonary sarcoidosis (HCC)    Treatment with Prednisone    Past Surgical History:  Procedure Laterality Date   ABDOMINAL HYSTERECTOMY     APPENDECTOMY     CHOLECYSTECTOMY     LAPAROSCOPIC GASTRIC SLEEVE RESECTION N/A 02/15/2019   Procedure: LAPAROSCOPIC GASTRIC SLEEVE RESECTION, UPPER ENDO, ERAS Pathway, Hiatal Hernia Repair;  Surgeon: Kinsinger, De BlanchLuke Aaron, [name];  Location: [facility];  Service: General;  Laterality: N/A;   LAPAROSCOPY N/A 02/17/2019   Procedure: LAPAROSCOPY DIAGNOSTIC CONVERTED TO OPEN, EVACUATION OF HEMATOMA AND LIGATION OF VESSEL;  Surgeon: Kinsinger, De BlanchLuke Aaron, [name];  Location: [facility];  Service: General;  Laterality: N/A;     reports that she quit smoking about 6 years ago. Her smoking use included cigarettes. She has a 6.00 pack-year smoking history. She has never used smokeless tobacco. She reports that she does not drink alcohol or use drugs.  Allergies  Allergen Reactions   Aspirin Hives    Family History  Problem Relation Age of Onset   Hypertension Mother    Lupus Father    Colon cancer Neg Hx  Prior to Admission medications   Medication Sig Start Date End Date Taking? Authorizing Provider  acetaminophen (TYLENOL) 500 MG tablet Take 2 tablets (1,000 mg total) by mouth every 8 (eight) hours for 5 days. 02/22/19 02/27/19  Kinsinger, De BlanchLuke Aaron, [name]  ALPRAZolam Prudy Feeler(XANAX) 0.5 MG tablet Take 0.5 mg by mouth 2 (two) times daily as needed for anxiety  or sleep.     Provider, Historical, [name]  amLODipine (NORVASC) 5 MG tablet Take 5 mg by mouth daily.    Provider, Historical, [name]  azithromycin (ZITHROMAX) 500 MG tablet Take 1 tablet (500 mg total) by mouth daily. 02/23/19   Kinsinger, De BlanchLuke Aaron, [name]  calcium carbonate (TUMS - DOSED IN MG ELEMENTAL CALCIUM) 500 MG chewable tablet Chew 1 tablet by mouth daily as needed for indigestion or heartburn.    Provider, Historical, [name]  cyclobenzaprine (FLEXERIL) 10 MG tablet Take 10 mg by mouth 2 (two) times daily as needed for muscle spasms.     Provider, Historical, [name]  enoxaparin (LOVENOX) 40 MG/0.4ML injection Inject 0.4 mLs (40 mg total) into the skin daily for 14 doses. 02/22/19 03/08/19  Kinsinger, De BlanchLuke Aaron, [name]  gabapentin (NEURONTIN) 100 MG capsule Take 2 capsules (200 mg total) by mouth every 12 (twelve) hours. 02/22/19   Kinsinger, De BlanchLuke Aaron, [name]  Multiple Vitamins-Minerals (WOMENS 50+ MULTI VITAMIN/MIN PO) Take 1 tablet by mouth daily.    Provider, Historical, [name]  ondansetron (ZOFRAN-ODT) 4 MG disintegrating tablet Take 1 tablet (4 mg total) by mouth every 6 (six) hours as needed for nausea or vomiting. 02/22/19   Kinsinger, De BlanchLuke Aaron, [name]  oxyCODONE (OXY IR/ROXICODONE) 5 MG immediate release tablet Take 1 tablet (5 mg total) by mouth every 6 (six) hours as needed for severe pain. 02/22/19   Kinsinger, De BlanchLuke Aaron, [name]  pantoprazole (PROTONIX) 40 MG tablet Take 1 tablet (40 mg total) by mouth daily. 02/22/19   Kinsinger, De BlanchLuke Aaron, [name]    Physical Exam: Vitals:   02/24/19 0600 02/24/19 0630 02/24/19 0700 02/24/19 0730  BP: (!) 114/95 139/80 130/71 129/68  Pulse: 75 79 72 71  Resp: 15 20 20 19   Temp:      TempSrc:      SpO2: 96% 98% 98% 95%    Constitutional: NAD, calm, comfortable Vitals:   02/24/19 0600 02/24/19 0630 02/24/19 0700 02/24/19 0730  BP: (!) 114/95 139/80 130/71 129/68  Pulse: 75 79 72 71  Resp: 15 20 20 19   Temp:      TempSrc:      SpO2: 96% 98% 98% 95%   Eyes: lids and  conjunctivae normal ENMT: Mucous membranes are moist.  Neck: normal, supple Respiratory: clear to auscultation bilaterally. Normal respiratory effort. No accessory muscle use.  Cardiovascular: Regular rate and rhythm, no murmurs. No extremity edema. Abdomen: no tenderness, no distention. Bowel sounds positive.  Musculoskeletal:  No joint deformity upper and lower extremities.   Skin: no rashes, lesions, ulcers.  Psychiatric: Normal judgment and insight. Alert and oriented x 3. Normal mood.   Labs on Admission: I have personally reviewed following labs and imaging studies  CBC: Recent Labs  Lab 02/17/19 1139  02/20/19 0509 02/21/19 0348 02/22/19 0344 02/23/19 0428 02/24/19 0355  WBC 16.7*   < > 12.8* 13.2* 12.9* 11.8* 14.7*  NEUTROABS 14.3*  --   --  11.0*  --   --  12.4*  HGB 6.3*   < > 8.7* 8.6* 8.4* 8.3* 9.0*  HCT 20.0*   < > 25.9* 26.2* 25.6* 25.4*  27.8*  MCV 92.6   < > 87.8 90.7 90.8 89.8 89.1  PLT 172   < > 210 213 247 287 409*   < > = values in this interval not displayed.   Basic Metabolic Panel: Recent Labs  Lab 02/18/19 0422 02/19/19 0319 02/24/19 0355  NA 137 136 142  K 4.1 3.8 2.5*  CL 108 106 104  CO2 18* 23 26  GLUCOSE 152* 118* 107*  BUN 23* 14 <5*  CREATININE 1.47* 0.82 0.64  CALCIUM 7.6* 8.0* 8.2*   GFR: Estimated Creatinine Clearance: 91.5 mL/min (by C-G formula based on SCr of 0.64 mg/dL). Liver Function Tests: Recent Labs  Lab 02/24/19 0355  AST 22  ALT 45*  ALKPHOS 77  BILITOT 0.6  PROT 7.2  ALBUMIN 2.5*   Recent Labs  Lab 02/24/19 0355  LIPASE 33   No results for input(s): AMMONIA in the last 168 hours. Coagulation Profile: No results for input(s): INR, PROTIME in the last 168 hours. Cardiac Enzymes: No results for input(s): CKTOTAL, CKMB, CKMBINDEX, TROPONINI in the last 168 hours. BNP (last 3 results) No results for input(s): PROBNP in the last 8760 hours. HbA1C: No results for input(s): HGBA1C in the last 72  hours. CBG: No results for input(s): GLUCAP in the last 168 hours. Lipid Profile: No results for input(s): CHOL, HDL, LDLCALC, TRIG, CHOLHDL, LDLDIRECT in the last 72 hours. Thyroid Function Tests: No results for input(s): TSH, T4TOTAL, FREET4, T3FREE, THYROIDAB in the last 72 hours. Anemia Panel: No results for input(s): VITAMINB12, FOLATE, FERRITIN, TIBC, IRON, RETICCTPCT in the last 72 hours. Urine analysis:    Component Value Date/Time   COLORURINE YELLOW 11/19/2016 0820   APPEARANCEUR HAZY (A) 11/19/2016 0820   LABSPEC 1.012 11/19/2016 0820   PHURINE 5.0 11/19/2016 0820   GLUCOSEU NEGATIVE 11/19/2016 0820   HGBUR SMALL (A) 11/19/2016 0820   BILIRUBINUR NEGATIVE 11/19/2016 0820   KETONESUR NEGATIVE 11/19/2016 0820   PROTEINUR NEGATIVE 11/19/2016 0820   UROBILINOGEN 0.2 05/13/2010 1636   NITRITE POSITIVE (A) 11/19/2016 0820   LEUKOCYTESUR MODERATE (A) 11/19/2016 0820    Radiological Exams on Admission: Ct Angio Chest Pe W Or Wo Contrast  Result Date: 02/23/2019 CLINICAL DATA:  Hypoxemia. Respiratory failure. Recent lap band procedure. Evaluate for pulmonary emboli. EXAM: CT ANGIOGRAPHY CHEST WITH CONTRAST TECHNIQUE: Multidetector CT imaging of the chest was performed using the standard protocol during bolus administration of intravenous contrast. Multiplanar CT image reconstructions and MIPs were obtained to evaluate the vascular anatomy. CONTRAST:  OMNIPAQUE IOHEXOL 350 MG/ML SOLN COMPARISON:  Chest are by 13 2020 FINDINGS: Cardiovascular: The thoracic aorta demonstrates minimal atherosclerotic change. No aneurysm or dissection identified. Cardiomegaly is noted. Respiratory motion limits evaluation of pulmonary arteries. No definite pulmonary emboli identified within these limitations. Mediastinum/Nodes: The thyroid and esophagus are normal. There is a tiny left pleural effusion. No pericardial effusion or right pleural effusion. No adenopathy. Lungs/Pleura: Central airways are  normal. No pneumothorax. Opacities in the lungs are most prominent in the right middle lobe and bilateral lower lobes. More mild opacities are seen in the upper lobes towards the apices. There is a nodule in the left apex measuring up to 8 by 8 by 5 mm with a mean diameter 7 mm. The scattered opacities limit evaluation for other nodules. No other definitive nodules are identified. No other infiltrates or masses. Upper Abdomen: Postoperative changes are seen in the upper abdomen. There is fluid in the abdominal wall deep to the skin staples. A  few foci of air are seen in the fluid as well. No other acute abnormalities. Musculoskeletal: No chest wall abnormality. No acute or significant osseous findings. Review of the MIP images confirms the above findings. IMPRESSION: 1. Evaluation for pulmonary emboli is limited due to respiratory motion. No central emboli identified. 2. Bilateral pulmonary opacities most prominent the right middle lobe and bases. These findings could represent significant atelectasis versus multifocal pneumonia. Recommend clinical correlation. 3. 7 mm nodule in the left apex. Whether this is a true nodule or part of the streaky opacities in the apices is unclear. Non-contrast chest CT at 6-12 months is recommended. If the nodule is stable at time of repeat CT, then future CT at 18-24 months (from today's scan) is considered optional for low-risk patients, but is recommended for high-risk patients. This recommendation follows the consensus statement: Guidelines for Management of Incidental Pulmonary Nodules Detected on CT Images: From the Fleischner Society 2017; Radiology 2017; 284:228-243. 4. Postoperative changes are seen in the upper abdomen with fluid in the anterior abdominal wall deep to the skin staples. A few foci of air seen in the fluid which are nonspecific but could be postoperative. Recommend attention on follow-up. 5. Cardiomegaly. 6. Mild atherosclerotic changes in the thoracic  aorta. 7. Tiny left pleural effusion. Aortic Atherosclerosis (ICD10-I70.0). Electronically Signed   By: Dorise Bullion III M.D   On: 02/23/2019 11:39   Ct Abdomen Pelvis W Contrast  Result Date: 02/24/2019 CLINICAL DATA:  Emesis and abdominal pain. Recent gastric bypass surgery EXAM: CT ABDOMEN AND PELVIS WITH CONTRAST TECHNIQUE: Multidetector CT imaging of the abdomen and pelvis was performed using the standard protocol following bolus administration of intravenous contrast. CONTRAST:  185mL OMNIPAQUE IOHEXOL 300 MG/ML  SOLN COMPARISON:  02/17/2019 FINDINGS: Lower chest:  Dependent atelectasis.  Borderline heart size Hepatobiliary: No focal liver abnormality.Cholecystectomy. No bile duct dilatation Pancreas: Stranding adjacent to the pancreas is attributed to surgery. The pancreas itself does not appear expanded or edematous. Spleen: Unremarkable. Adrenals/Urinary Tract: Negative adrenals. No hydronephrosis or stone. Unremarkable bladder. Stomach/Bowel: Postoperative stomach. Small volume residual hematoma along the staple line. Thickened bowel loops diffusely with mild submucosal low-density edema. Increased fluid content within the colon reaching the rectum. No pericecal inflammation. Vascular/Lymphatic: Moderate atherosclerotic calcification. Major vessels are patent. No mass or adenopathy. Reproductive:Hysterectomy. Other: No ascites or pneumoperitoneum.  Diastasis rectus. Musculoskeletal: No acute abnormalities. IMPRESSION: 1. Nonspecific enteritis with increased colonic fluid contents suggesting associated diarrhea. 2. Largely resolved hematoma about the operative site with no active hemorrhage. Electronically Signed   By: Monte Fantasia M.D.   On: 02/24/2019 05:58   Dg Chest Port 1 View  Result Date: 02/24/2019 CLINICAL DATA:  Shortness of breath EXAM: PORTABLE CHEST 1 VIEW COMPARISON:  02/22/2019, 09/04/2018 FINDINGS: Improved aeration since 02/22/2019. Platelike areas of atelectasis at the left  base and right infrahilar lung. No pleural effusion. Mild cardiomegaly. No pneumothorax. IMPRESSION: Suspected subsegmental atelectasis in the right infrahilar lung and left base. Cardiomegaly without edema. Electronically Signed   By: Donavan Foil M.D.   On: 02/24/2019 03:28   Dg Chest Port 1 View  Result Date: 02/22/2019 CLINICAL DATA:  Acute shortness of breath today EXAM: PORTABLE CHEST 1 VIEW COMPARISON:  09/04/2018 FINDINGS: Cardiomegaly again noted. Bilateral LOWER lung opacities are present and may represent atelectasis and/or airspace disease. No definite pleural effusion. No pneumothorax or acute bony abnormality. IMPRESSION: Bilateral LOWER lung opacities which may represent atelectasis and/or airspace disease. Cardiomegaly. Electronically Signed   By: Dellis Filbert  Hu M.D.   On: 02/22/2019 09:36    Assessment/Plan Active Problems:   Postoperative nausea and vomiting    Postoperative nausea and vomiting in the setting of recent sleeve gastrectomy and open evacuation of hematoma and ligation of vessels 7/8 -Nonspecific enteritis noted on CT scan, but appears to be likely more an ileus given hypokalemia -Plan to replete potassium and check magnesium levels with potassium goal of 4 and magnesium of 2 -We will try IV Toradol for abdominal pain management and try to avoid narcotics -Transfer to [facility] for further evaluation by surgeon Dr. Sheliah Chase -Repeat a.m. labs -Maintain on IV fluid -N.p.o. until seen by general surgery -We will order GI panel to assess for any viral gastroenteritis if stool can be collected  Severe hypokalemia -Continue telemetry monitoring -Repletion ordered in ED and will order further IV repletion -Recheck a.m. labs  Recent multifocal pneumonia -No new chest x-ray findings or significant hypoxemia -We will maintain back on IV Rocephin and azithromycin for now  GERD -PPI IV daily  Hypertension -Hydralazine to be ordered PRN severe elevations   DVT  prophylaxis: Lovenox Code Status: Full Family Communication: None at bedside; will attempt to call Disposition Plan:[facility] and management per General Surgery Consults called:Please call Dr. Sheliah Chase Gen Surg on arrival to see patient Admission status: Obs, Tele   Breck Hollinger Hoover BrunetteD Kynli Chou DO [facility] Pager [phone]  If 7PM-7AM, please contact night-coverage [email or web address] Password [email or web address]  02/24/2019, 8:12 AM

## 2019-02-25 DIAGNOSIS — R112 Nausea with vomiting, unspecified: Secondary | ICD-10-CM | POA: Diagnosis not present

## 2019-02-25 DIAGNOSIS — K529 Noninfective gastroenteritis and colitis, unspecified: Secondary | ICD-10-CM | POA: Diagnosis present

## 2019-02-25 DIAGNOSIS — E876 Hypokalemia: Secondary | ICD-10-CM | POA: Diagnosis present

## 2019-02-25 DIAGNOSIS — Z87891 Personal history of nicotine dependence: Secondary | ICD-10-CM | POA: Diagnosis not present

## 2019-02-25 DIAGNOSIS — Z8249 Family history of ischemic heart disease and other diseases of the circulatory system: Secondary | ICD-10-CM | POA: Diagnosis not present

## 2019-02-25 DIAGNOSIS — K219 Gastro-esophageal reflux disease without esophagitis: Secondary | ICD-10-CM | POA: Diagnosis present

## 2019-02-25 DIAGNOSIS — Z6841 Body Mass Index (BMI) 40.0 and over, adult: Secondary | ICD-10-CM | POA: Diagnosis not present

## 2019-02-25 DIAGNOSIS — D860 Sarcoidosis of lung: Secondary | ICD-10-CM | POA: Diagnosis present

## 2019-02-25 DIAGNOSIS — Z1159 Encounter for screening for other viral diseases: Secondary | ICD-10-CM | POA: Diagnosis not present

## 2019-02-25 DIAGNOSIS — Z886 Allergy status to analgesic agent status: Secondary | ICD-10-CM | POA: Diagnosis not present

## 2019-02-25 DIAGNOSIS — E669 Obesity, unspecified: Secondary | ICD-10-CM | POA: Diagnosis present

## 2019-02-25 DIAGNOSIS — E7800 Pure hypercholesterolemia, unspecified: Secondary | ICD-10-CM | POA: Diagnosis present

## 2019-02-25 DIAGNOSIS — I10 Essential (primary) hypertension: Secondary | ICD-10-CM | POA: Diagnosis present

## 2019-02-25 DIAGNOSIS — F419 Anxiety disorder, unspecified: Secondary | ICD-10-CM | POA: Diagnosis present

## 2019-02-25 DIAGNOSIS — Z79899 Other long term (current) drug therapy: Secondary | ICD-10-CM | POA: Diagnosis not present

## 2019-02-25 DIAGNOSIS — Z9889 Other specified postprocedural states: Secondary | ICD-10-CM | POA: Diagnosis not present

## 2019-02-25 DIAGNOSIS — Z79891 Long term (current) use of opiate analgesic: Secondary | ICD-10-CM | POA: Diagnosis not present

## 2019-02-25 LAB — C DIFFICILE QUICK SCREEN W PCR REFLEX
C Diff antigen: NEGATIVE
C Diff interpretation: NOT DETECTED
C Diff toxin: NEGATIVE

## 2019-02-25 LAB — GASTROINTESTINAL PANEL BY PCR, STOOL (REPLACES STOOL CULTURE)

## 2019-02-25 LAB — COMPREHENSIVE METABOLIC PANEL
ALT: 29 U/L (ref 0–44)
AST: 11 U/L — ABNORMAL LOW (ref 15–41)
Albumin: 1.9 g/dL — ABNORMAL LOW (ref 3.5–5.0)
Alkaline Phosphatase: 60 U/L (ref 38–126)
Anion gap: 10 (ref 5–15)
BUN: 5 mg/dL — ABNORMAL LOW (ref 6–20)
CO2: 25 mmol/L (ref 22–32)
Calcium: 8.3 mg/dL — ABNORMAL LOW (ref 8.9–10.3)
Chloride: 109 mmol/L (ref 98–111)
Creatinine, Ser: 0.69 mg/dL (ref 0.44–1.00)
GFR calc Af Amer: >60 mL/min (ref >60)
GFR calc non Af Amer: >60 mL/min (ref >60)
Glucose, Bld: 79 mg/dL (ref 70–99)
Potassium: 3.5 mmol/L (ref 3.5–5.1)
Sodium: 144 mmol/L (ref 135–145)
Total Bilirubin: 0.8 mg/dL (ref 0.3–1.2)
Total Protein: 6.2 g/dL — ABNORMAL LOW (ref 6.5–8.1)

## 2019-02-25 LAB — TYPE AND SCREEN
ABO/RH(D): O POS
Antibody Screen: NEGATIVE

## 2019-02-25 LAB — CBC
HCT: 24 % — ABNORMAL LOW (ref 36.0–46.0)
Hemoglobin: 7.7 g/dL — ABNORMAL LOW (ref 12.0–15.0)
MCH: 29.5 pg (ref 26.0–34.0)
MCHC: 32.1 g/dL (ref 30.0–36.0)
MCV: 92 fL (ref 80.0–100.0)
Platelets: 425 10*3/uL — ABNORMAL HIGH (ref 150–400)
RBC: 2.61 MIL/uL — ABNORMAL LOW (ref 3.87–5.11)
RDW: 15.1 % (ref 11.5–15.5)
WBC: 11.3 10*3/uL — ABNORMAL HIGH (ref 4.0–10.5)
nRBC: 0 % (ref 0.0–0.2)

## 2019-02-25 LAB — HIV ANTIBODY (ROUTINE TESTING W REFLEX): HIV Screen 4th Generation wRfx: NONREACTIVE

## 2019-02-25 LAB — MAGNESIUM: Magnesium: 1.6 mg/dL — ABNORMAL LOW (ref 1.7–2.4)

## 2019-02-25 MED ORDER — MAGNESIUM SULFATE 2 GM/50ML IV SOLN
2.0000 g | Freq: Once | INTRAVENOUS | Status: AC
  Administered 2019-02-25: 2 g via INTRAVENOUS
  Filled 2019-02-25: qty 50

## 2019-02-25 MED ORDER — ENSURE MAX PROTEIN PO LIQD
11.0000 [oz_av] | Freq: Two times a day (BID) | ORAL | Status: DC
  Administered 2019-02-25: 11 [oz_av] via ORAL
  Filled 2019-02-25 (×13): qty 330

## 2019-02-25 NOTE — Progress Notes (Signed)
S: pain moderate, +diarrhea, nausea yesterday, no shortness of breath O: BP 120/68 (BP Location: Left Arm) Comment: noted  Pulse (!) 58   Temp 98.4 F (36.9 C) (Oral)   Resp 18   Wt 111.2 kg   SpO2 100%   BMI 43.43 kg/m  Gen: NAD Neuro: AOx4 Midline intact with small amount of drainage  A/P 58 yo female s/p sleeve gastrectomy complicated with bleed requiring reoperation. Discharged on antibiotics now having diarrhea -will restart bari diet and protein supplement -f/u labs -continue supportive care

## 2019-02-25 NOTE — Progress Notes (Signed)
PROGRESS NOTE    [name]  ZOX:[id number] DOB: [birth year] DOA: 02/24/2019 PCP: Kari BaarsHawkins, Edward, MD     Brief Narrative:  Allison RaringBarbara Pelland is a 58 y.o. female with medical history significant for obesity with gastric sleeve surgery, GERD, obesity, hypertension, and anxiety who presented to the emergency department after discharge just on 7/14 after hospitalization for gastric sleeve surgery with complication of hemoperitoneum secondary to bleeding blood vessel which was ligated and she required 3 units of PRBCs.  She was noted to have stabilization following the procedure, but unfortunately developed worsening shortness of breath and underwent CT scan of her chest that did not demonstrate any pulmonary embolus, however she was noted to have multifocal pneumonia and was started on IV antibiotics (azithromycin and Rocephin).  She was subsequently discharged as she was feeling much better and less short of breath.  She states that she continued to feel short of breath all day after discharge and went to sleep overnight and awoke with sudden nausea and vomiting with some mild hematemesis noted.  She was also noted to have some bleeding from her surgical incision site.  She started to have some shortness of breath, but denies any chest pain, coughing, fevers, or chills. CT of the abdomen and pelvis with no reaccumulation of hematoma or acute pathology noted.  There are findings of nonspecific enteritis, however.  Patient was subsequently transferred from [facility] to [facility], general surgery consulted.  New events last 24 hours / Subjective: Continues to have sharp central abdominal pain associated with constant diarrhea which is watery in nature.  Admits to fever 100.2 at home prior to admission.  Nausea and vomiting as well.  Breathing has been stabilized since admission.  Assessment & Plan:   Active Problems:   Postoperative nausea and vomiting   Postoperative nausea and vomiting  in the setting of recent sleeve gastrectomy and open evacuation of hematoma and ligation of vessels 7/8 -Nonspecific enteritis noted on CT scan -Check C. difficile, GI PCR.  Patient denies taking any laxatives -Per general surgery  Severe hypokalemia -Replaced, improved  Hypomagnesemia -Replace, trend  Recent multifocal pneumonia -Chest x-ray: Suspected subsegmental atelectasis in the right infrahilar lung and left base.  She was diagnosed with pneumonia during previous admission, treated with Rocephin and azithromycin (started 7/13) -Resume Rocephin, azithromycin for CAP   DVT prophylaxis: Lovenox Code Status: Full Family Communication: None Disposition Plan: Pending further clinical improvement   Consultants:   General surgery  Procedures:   None  Antimicrobials:  Anti-infectives (From admission, onward)   Start     Dose/Rate Route Frequency Ordered Stop   02/24/19 0845  cefTRIAXone (ROCEPHIN) 1 g in sodium chloride 0.9 % 100 mL IVPB     1 g 200 mL/hr over 30 Minutes Intravenous Every 24 hours 02/24/19 0830     02/24/19 0845  azithromycin (ZITHROMAX) 500 mg in sodium chloride 0.9 % 250 mL IVPB     500 mg 250 mL/hr over 60 Minutes Intravenous Every 24 hours 02/24/19 0830         Objective: Vitals:   02/24/19 1530 02/24/19 1709 02/24/19 2235 02/25/19 0608  BP: 137/75 (!) 110/56 (!) 119/57 120/68  Pulse: 64 74 60 (!) 58  Resp: 16 20 18 18   Temp:  98.3 F (36.8 C) (!) 97.5 F (36.4 C) 98.4 F (36.9 C)  TempSrc:    Oral  SpO2: 100% 100% 100% 100%  Weight:  111.2 kg      Intake/Output Summary (Last  24 hours) at 02/25/2019 1034 Last data filed at 02/25/2019 0308 Gross per 24 hour  Intake 504.39 ml  Output --  Net 504.39 ml   Filed Weights   02/24/19 1709  Weight: 111.2 kg    Examination:  General exam: Appears calm  Respiratory system: Clear to auscultation. Respiratory effort normal. Cardiovascular system: S1 & S2 heard, RRR. No JVD, murmurs,  rubs, gallops or clicks. No pedal edema. Gastrointestinal system: Abdomen is mildly distended, tender to palpation lower quadrants, midline incision and staples intact, minimal amount of drainage noted Central nervous system: Alert and oriented. No focal neurological deficits. Extremities: Symmetric 5 x 5 power. Skin: No rashes, lesions or ulcers Psychiatry: Judgement and insight appear normal. Mood & affect appropriate.   Data Reviewed: I have personally reviewed following labs and imaging studies  CBC: Recent Labs  Lab 02/21/19 0348 02/22/19 0344 02/23/19 0428 02/24/19 0355 02/25/19 0341  WBC 13.2* 12.9* 11.8* 14.7* 11.3*  NEUTROABS 11.0*  --   --  12.4*  --   HGB 8.6* 8.4* 8.3* 9.0* 7.7*  HCT 26.2* 25.6* 25.4* 27.8* 24.0*  MCV 90.7 90.8 89.8 89.1 92.0  PLT 213 247 287 409* 425*   Basic Metabolic Panel: Recent Labs  Lab 02/19/19 0319 02/24/19 0355 02/24/19 0615 02/25/19 0341  NA 136 142  --  144  K 3.8 2.5*  --  3.5  CL 106 104  --  109  CO2 23 26  --  25  GLUCOSE 118* 107*  --  79  BUN 14 <5*  --  5*  CREATININE 0.82 0.64  --  0.69  CALCIUM 8.0* 8.2*  --  8.3*  MG  --   --  1.8 1.6*   GFR: Estimated Creatinine Clearance: 93 mL/min (by C-G formula based on SCr of 0.69 mg/dL). Liver Function Tests: Recent Labs  Lab 02/24/19 0355 02/25/19 0341  AST 22 11*  ALT 45* 29  ALKPHOS 77 60  BILITOT 0.6 0.8  PROT 7.2 6.2*  ALBUMIN 2.5* 1.9*   Recent Labs  Lab 02/24/19 0355  LIPASE 33   No results for input(s): AMMONIA in the last 168 hours. Coagulation Profile: No results for input(s): INR, PROTIME in the last 168 hours. Cardiac Enzymes: No results for input(s): CKTOTAL, CKMB, CKMBINDEX, TROPONINI in the last 168 hours. BNP (last 3 results) No results for input(s): PROBNP in the last 8760 hours. HbA1C: No results for input(s): HGBA1C in the last 72 hours. CBG: No results for input(s): GLUCAP in the last 168 hours. Lipid Profile: No results for input(s):  CHOL, HDL, LDLCALC, TRIG, CHOLHDL, LDLDIRECT in the last 72 hours. Thyroid Function Tests: No results for input(s): TSH, T4TOTAL, FREET4, T3FREE, THYROIDAB in the last 72 hours. Anemia Panel: No results for input(s): VITAMINB12, FOLATE, FERRITIN, TIBC, IRON, RETICCTPCT in the last 72 hours. Sepsis Labs: No results for input(s): PROCALCITON, LATICACIDVEN in the last 168 hours.  Recent Results (from the past 240 hour(s))  MRSA PCR Screening     Status: None   Collection Time: 02/17/19  4:25 PM   Specimen: Nasal Mucosa; Nasopharyngeal  Result Value Ref Range Status   MRSA by PCR NEGATIVE NEGATIVE Final    Comment:        The GeneXpert MRSA Assay (FDA approved for NASAL specimens only), is one component of a comprehensive MRSA colonization surveillance program. It is not intended to diagnose MRSA infection nor to guide or monitor treatment for MRSA infections. Performed at [facility], [address] [place], [place] [address]   SARS Coronavirus 2 (CEPHEID - Performed in [facility] hospital lab), Hosp Order     Status: None   Collection Time: 02/24/19  5:40 AM   Specimen: Nasopharyngeal Swab  Result Value Ref Range Status   SARS Coronavirus 2 NEGATIVE NEGATIVE Final    Comment: (NOTE) If result is NEGATIVE SARS-CoV-2 target nucleic acids are NOT DETECTED. The SARS-CoV-2 RNA is generally detectable in upper and lower  respiratory specimens during the acute phase of infection. The lowest  concentration of SARS-CoV-2 viral copies this assay can detect is 250  copies / mL. A negative result does not preclude SARS-CoV-2 infection  and should not be used as the sole basis for treatment or other  patient management decisions.  A negative result may occur with  improper specimen collection / handling, submission of specimen other  than nasopharyngeal swab, presence of viral mutation(s) within the  areas targeted by this assay, and inadequate number of viral copies    (<250 copies / mL). A negative result must be combined with clinical  observations, patient history, and epidemiological information. If result is POSITIVE SARS-CoV-2 target nucleic acids are DETECTED. The SARS-CoV-2 RNA is generally detectable in upper and lower  respiratory specimens dur ing the acute phase of infection.  Positive  results are indicative of active infection with SARS-CoV-2.  Clinical  correlation with patient history and other diagnostic information is  necessary to determine patient infection status.  Positive results do  not rule out bacterial infection or co-infection with other viruses. If result is PRESUMPTIVE POSTIVE SARS-CoV-2 nucleic acids MAY BE PRESENT.   A presumptive positive result was obtained on the submitted specimen  and confirmed on repeat testing.  While 2019 novel coronavirus  (SARS-CoV-2) nucleic acids may be present in the submitted sample  additional confirmatory testing may be necessary for epidemiological  and / or clinical management purposes  to differentiate between  SARS-CoV-2 and other Sarbecovirus currently known to infect humans.  If clinically indicated additional testing with an alternate test  methodology [phone]([id number]) is advised. The SARS-CoV-2 RNA is generally  detectable in upper and lower respiratory sp ecimens during the acute  phase of infection. The expected result is Negative. Fact Sheet for Patients:  BoilerBrush.com.cyhttps://www.fda.gov/media/136312/download Fact Sheet for Healthcare Providers: https://pope.com/https://www.fda.gov/media/136313/download This test is not yet approved or cleared by the [place] FDA and has been authorized for detection and/or diagnosis of SARS-CoV-2 by FDA under an Emergency Use Authorization (EUA).  This EUA will remain in effect (meaning this test can be used) for the duration of the COVID-19 declaration under Section 564(b)(1) of the Act, 21 U.S.C. section 360bbb-3(b)(1), unless the authorization is terminated  or revoked sooner. Performed at [facility], [address] [place], [place] [address]       Radiology Studies: Ct Angio Chest Pe W Or Wo Contrast  Result Date: 02/23/2019 CLINICAL DATA:  Hypoxemia. Respiratory failure. Recent lap band procedure. Evaluate for pulmonary emboli. EXAM: CT ANGIOGRAPHY CHEST WITH CONTRAST TECHNIQUE: Multidetector CT imaging of the chest was performed using the standard protocol during bolus administration of intravenous contrast. Multiplanar CT image reconstructions and MIPs were obtained to evaluate the vascular anatomy. CONTRAST:  100mL OMNIPAQUE IOHEXOL 350 MG/ML SOLN COMPARISON:  Chest are by 13 2020 FINDINGS: Cardiovascular: The thoracic aorta demonstrates minimal atherosclerotic change. No aneurysm or dissection identified. Cardiomegaly is noted. Respiratory motion limits evaluation of pulmonary arteries. No definite pulmonary emboli identified within these limitations. Mediastinum/Nodes: The thyroid  and esophagus are normal. There is a tiny left pleural effusion. No pericardial effusion or right pleural effusion. No adenopathy. Lungs/Pleura: Central airways are normal. No pneumothorax. Opacities in the lungs are most prominent in the right middle lobe and bilateral lower lobes. More mild opacities are seen in the upper lobes towards the apices. There is a nodule in the left apex measuring up to 8 by 8 by 5 mm with a mean diameter 7 mm. The scattered opacities limit evaluation for other nodules. No other definitive nodules are identified. No other infiltrates or masses. Upper Abdomen: Postoperative changes are seen in the upper abdomen. There is fluid in the abdominal wall deep to the skin staples. A few foci of air are seen in the fluid as well. No other acute abnormalities. Musculoskeletal: No chest wall abnormality. No acute or significant osseous findings. Review of the MIP images confirms the above findings. IMPRESSION: 1. Evaluation for pulmonary emboli is limited  due to respiratory motion. No central emboli identified. 2. Bilateral pulmonary opacities most prominent the right middle lobe and bases. These findings could represent significant atelectasis versus multifocal pneumonia. Recommend clinical correlation. 3. 7 mm nodule in the left apex. Whether this is a true nodule or part of the streaky opacities in the apices is unclear. Non-contrast chest CT at 6-12 months is recommended. If the nodule is stable at time of repeat CT, then future CT at 18-24 months (from today's scan) is considered optional for low-risk patients, but is recommended for high-risk patients. This recommendation follows the consensus statement: Guidelines for Management of Incidental Pulmonary Nodules Detected on CT Images: From the Fleischner Society 2017; Radiology 2017; 284:228-243. 4. Postoperative changes are seen in the upper abdomen with fluid in the anterior abdominal wall deep to the skin staples. A few foci of air seen in the fluid which are nonspecific but could be postoperative. Recommend attention on follow-up. 5. Cardiomegaly. 6. Mild atherosclerotic changes in the thoracic aorta. 7. Tiny left pleural effusion. Aortic Atherosclerosis (ICD10-I70.0). Electronically Signed   By: Gerome Samavid  Williams III M.D   On: 02/23/2019 11:39   Ct Abdomen Pelvis W Contrast  Result Date: 02/24/2019 CLINICAL DATA:  Emesis and abdominal pain. Recent gastric bypass surgery EXAM: CT ABDOMEN AND PELVIS WITH CONTRAST TECHNIQUE: Multidetector CT imaging of the abdomen and pelvis was performed using the standard protocol following bolus administration of intravenous contrast. CONTRAST:  100mL OMNIPAQUE IOHEXOL 300 MG/ML  SOLN COMPARISON:  02/17/2019 FINDINGS: Lower chest:  Dependent atelectasis.  Borderline heart size Hepatobiliary: No focal liver abnormality.Cholecystectomy. No bile duct dilatation Pancreas: Stranding adjacent to the pancreas is attributed to surgery. The pancreas itself does not appear expanded  or edematous. Spleen: Unremarkable. Adrenals/Urinary Tract: Negative adrenals. No hydronephrosis or stone. Unremarkable bladder. Stomach/Bowel: Postoperative stomach. Small volume residual hematoma along the staple line. Thickened bowel loops diffusely with mild submucosal low-density edema. Increased fluid content within the colon reaching the rectum. No pericecal inflammation. Vascular/Lymphatic: Moderate atherosclerotic calcification. Major vessels are patent. No mass or adenopathy. Reproductive:Hysterectomy. Other: No ascites or pneumoperitoneum.  Diastasis rectus. Musculoskeletal: No acute abnormalities. IMPRESSION: 1. Nonspecific enteritis with increased colonic fluid contents suggesting associated diarrhea. 2. Largely resolved hematoma about the operative site with no active hemorrhage. Electronically Signed   By: Marnee SpringJonathon  Watts M.D.   On: 02/24/2019 05:58   Dg Chest Port 1 View  Result Date: 02/24/2019 CLINICAL DATA:  Shortness of breath EXAM: PORTABLE CHEST 1 VIEW COMPARISON:  02/22/2019, 09/04/2018 FINDINGS: Improved aeration since 02/22/2019. Platelike areas  of atelectasis at the left base and right infrahilar lung. No pleural effusion. Mild cardiomegaly. No pneumothorax. IMPRESSION: Suspected subsegmental atelectasis in the right infrahilar lung and left base. Cardiomegaly without edema. Electronically Signed   By: Donavan Foil M.D.   On: 02/24/2019 03:28      Scheduled Meds:  enoxaparin (LOVENOX) injection  40 mg Subcutaneous Q24H   Ensure Max Protein  11 oz Oral BID   Continuous Infusions:  azithromycin Stopped (02/24/19 1134)   cefTRIAXone (ROCEPHIN)  IV Stopped (02/24/19 1030)     LOS: 0 days     Time spent: 35 minutes   Dessa Phi, DO [facility] [email or web address] 02/25/2019, 10:34 AM

## 2019-02-26 LAB — BASIC METABOLIC PANEL
Anion gap: 13 (ref 5–15)
BUN: 5 mg/dL — ABNORMAL LOW (ref 6–20)
CO2: 23 mmol/L (ref 22–32)
Calcium: 8.3 mg/dL — ABNORMAL LOW (ref 8.9–10.3)
Chloride: 107 mmol/L (ref 98–111)
Creatinine, Ser: 0.72 mg/dL (ref 0.44–1.00)
GFR calc Af Amer: >60 mL/min (ref >60)
GFR calc non Af Amer: >60 mL/min (ref >60)
Glucose, Bld: 69 mg/dL — ABNORMAL LOW (ref 70–99)
Potassium: 3.2 mmol/L — ABNORMAL LOW (ref 3.5–5.1)
Sodium: 143 mmol/L (ref 135–145)

## 2019-02-26 LAB — CBC
HCT: 25.1 % — ABNORMAL LOW (ref 36.0–46.0)
Hemoglobin: 7.8 g/dL — ABNORMAL LOW (ref 12.0–15.0)
MCH: 28.3 pg (ref 26.0–34.0)
MCHC: 31.1 g/dL (ref 30.0–36.0)
MCV: 90.9 fL (ref 80.0–100.0)
Platelets: 500 10*3/uL — ABNORMAL HIGH (ref 150–400)
RBC: 2.76 MIL/uL — ABNORMAL LOW (ref 3.87–5.11)
RDW: 14.9 % (ref 11.5–15.5)
WBC: 10.9 10*3/uL — ABNORMAL HIGH (ref 4.0–10.5)
nRBC: 0 % (ref 0.0–0.2)

## 2019-02-26 LAB — MAGNESIUM: Magnesium: 1.9 mg/dL (ref 1.7–2.4)

## 2019-02-26 LAB — ABO/RH: ABO/RH(D): O POS

## 2019-02-26 MED ORDER — POTASSIUM CHLORIDE CRYS ER 20 MEQ PO TBCR
40.0000 meq | EXTENDED_RELEASE_TABLET | ORAL | Status: AC
  Administered 2019-02-26 (×2): 40 meq via ORAL
  Filled 2019-02-26 (×2): qty 2

## 2019-02-26 NOTE — Progress Notes (Signed)
S: tolerating liquids, no dyspnea, less diarrhea O: BP 137/62 (BP Location: Right Arm)   Pulse 77   Temp 98.6 F (37 C) (Oral)   Resp 18   Ht 5\' 3"  (1.6 m)   Wt 112.5 kg   SpO2 94%   BMI 43.93 kg/m  Gen: NAD Neuro: AOx4 Abd: upper midline with thicker drainage from upper edge and 1/3 down.  -1 staple removed and packing placed at the 1/3 area and upper edge. Moderate amount of thick yellow drainage  A/P 58 yo female s/p sleeve gastrectomy, reexploration for bleeding, readmitted for diarrhea, now with concern for wound infection. CT shows fascial edges have dehisced. -continue daily packing to 2 wound areas -do not open staples further -continue abx with plan for 10 day course

## 2019-02-26 NOTE — Progress Notes (Addendum)
PROGRESS NOTE    [name]  WUJ:[id number] DOB: [birth year] DOA: 02/24/2019 PCP: Kari BaarsHawkins, Edward, MD     Brief Narrative:  [name] is a 58 y.o. female with medical history significant for obesity with gastric sleeve surgery, GERD, obesity, hypertension, and anxiety who presented to the emergency department after discharge just on 7/14 after hospitalization for gastric sleeve surgery with complication of hemoperitoneum secondary to bleeding blood vessel which was ligated and she required 3 units of PRBCs.  She was noted to have stabilization following the procedure, but unfortunately developed worsening shortness of breath and underwent CT scan of her chest that did not demonstrate any pulmonary embolus, however she was noted to have multifocal pneumonia and was started on IV antibiotics (azithromycin and Rocephin).  She was subsequently discharged as she was feeling much better and less short of breath.  She states that she continued to feel short of breath all day after discharge and went to sleep overnight and awoke with sudden nausea and vomiting with some mild hematemesis noted.  She was also noted to have some bleeding from her surgical incision site.  She started to have some shortness of breath, but denies any chest pain, coughing, fevers, or chills. CT of the abdomen and pelvis with no reaccumulation of hematoma or acute pathology noted.  There are findings of nonspecific enteritis, however.  Patient was subsequently [facility] to [facility], general surgery consulted.  New events last 24 hours / Subjective: Still having some central lower abdominal pain, nausea, diarrhea continues.  Assessment & Plan:   Active Problems:   Postoperative nausea and vomiting   Postoperative nausea and vomiting in the setting of recent sleeve gastrectomy and open evacuation of hematoma and ligation of vessels 7/8 -Nonspecific enteritis noted on CT scan -C. difficile,  GI PCR negative -Per general surgery  Hypokalemia -Replace, trend  Hypomagnesemia -Replaced, continue to monitor  Recent multifocal pneumonia -Chest x-ray: Suspected subsegmental atelectasis in the right infrahilar lung and left base.  She was diagnosed with pneumonia during previous admission, treated with Rocephin and azithromycin (started 7/13) -Continue Rocephin, azithromycin for CAP for total 5-day course (last dose 7/17)    DVT prophylaxis: Lovenox Code Status: Full Family Communication: None Disposition Plan: Pending further clinical improvement   Consultants:   General surgery  Procedures:   None  Antimicrobials:  Anti-infectives (From admission, onward)   Start     Dose/Rate Route Frequency Ordered Stop   02/24/19 0845  cefTRIAXone (ROCEPHIN) 1 g in sodium chloride 0.9 % 100 mL IVPB     1 g 200 mL/hr over 30 Minutes Intravenous Every 24 hours 02/24/19 0830     02/24/19 0845  azithromycin (ZITHROMAX) 500 mg in sodium chloride 0.9 % 250 mL IVPB     500 mg 250 mL/hr over 60 Minutes Intravenous Every 24 hours 02/24/19 0830         Objective: Vitals:   02/25/19 1850 02/25/19 2212 02/26/19 0429 02/26/19 0707  BP:  (!) 143/70 137/62   Pulse:  75 77   Resp:  18 18   Temp:  98.8 F (37.1 C) 98.6 F (37 C)   TempSrc:  Oral Oral   SpO2:  96% 94%   Weight:    112.5 kg  Height: 5\' 3"  (1.6 m)       Intake/Output Summary (Last 24 hours) at 02/26/2019 0950 Last data filed at 02/26/2019 0648 Gross per 24 hour  Intake 350 ml  Output 200 ml  Net 150 ml   Filed Weights   02/24/19 1709 02/26/19 0707  Weight: 111.2 kg 112.5 kg    Examination: General exam: Appears calm and comfortable  Respiratory system: Clear to auscultation. Respiratory effort normal. Cardiovascular system: S1 & S2 heard, RRR. No JVD, murmurs, rubs, gallops or clicks. No pedal edema. Gastrointestinal system: Abdomen is nondistended, soft and tender to palpation lower abdomen, central  incision dressing is clean and dry Central nervous system: Alert and oriented. No focal neurological deficits. Extremities: Symmetric 5 x 5 power. Skin: No rashes, lesions or ulcers Psychiatry: Judgement and insight appear normal. Mood & affect appropriate.    Data Reviewed: I have personally reviewed following labs and imaging studies  CBC: Recent Labs  Lab 02/21/19 0348 02/22/19 0344 02/23/19 0428 02/24/19 0355 02/25/19 0341 02/26/19 0245  WBC 13.2* 12.9* 11.8* 14.7* 11.3* 10.9*  NEUTROABS 11.0*  --   --  12.4*  --   --   HGB 8.6* 8.4* 8.3* 9.0* 7.7* 7.8*  HCT 26.2* 25.6* 25.4* 27.8* 24.0* 25.1*  MCV 90.7 90.8 89.8 89.1 92.0 90.9  PLT 213 247 287 409* 425* 211*   Basic Metabolic Panel: Recent Labs  Lab 02/24/19 0355 02/24/19 0615 02/25/19 0341 02/26/19 0245  NA 142  --  144 143  K 2.5*  --  3.5 3.2*  CL 104  --  109 107  CO2 26  --  25 23  GLUCOSE 107*  --  79 69*  BUN <5*  --  5* 5*  CREATININE 0.64  --  0.69 0.72  CALCIUM 8.2*  --  8.3* 8.3*  MG  --  1.8 1.6* 1.9   GFR: Estimated Creatinine Clearance: 93.6 mL/min (by C-G formula based on SCr of 0.72 mg/dL). Liver Function Tests: Recent Labs  Lab 02/24/19 0355 02/25/19 0341  AST 22 11*  ALT 45* 29  ALKPHOS 77 60  BILITOT 0.6 0.8  PROT 7.2 6.2*  ALBUMIN 2.5* 1.9*   Recent Labs  Lab 02/24/19 0355  LIPASE 33   No results for input(s): AMMONIA in the last 168 hours. Coagulation Profile: No results for input(s): INR, PROTIME in the last 168 hours. Cardiac Enzymes: No results for input(s): CKTOTAL, CKMB, CKMBINDEX, TROPONINI in the last 168 hours. BNP (last 3 results) No results for input(s): PROBNP in the last 8760 hours. HbA1C: No results for input(s): HGBA1C in the last 72 hours. CBG: No results for input(s): GLUCAP in the last 168 hours. Lipid Profile: No results for input(s): CHOL, HDL, LDLCALC, TRIG, CHOLHDL, LDLDIRECT in the last 72 hours. Thyroid Function Tests: No results for input(s):  TSH, T4TOTAL, FREET4, T3FREE, THYROIDAB in the last 72 hours. Anemia Panel: No results for input(s): VITAMINB12, FOLATE, FERRITIN, TIBC, IRON, RETICCTPCT in the last 72 hours. Sepsis Labs: No results for input(s): PROCALCITON, LATICACIDVEN in the last 168 hours.  Recent Results (from the past 240 hour(s))  MRSA PCR Screening     Status: None   Collection Time: 02/17/19  4:25 PM   Specimen: Nasal Mucosa; Nasopharyngeal  Result Value Ref Range Status   MRSA by PCR NEGATIVE NEGATIVE Final    Comment:        The GeneXpert MRSA Assay (FDA approved for NASAL specimens only), is one component of a comprehensive MRSA colonization surveillance program. It is not intended to diagnose MRSA infection nor to guide or monitor treatment for MRSA infections. Performed at [facility], [place] [address] [place], [place] [address]   SARS Coronavirus 2 (  CEPHEID - Performed in [facility] hospital lab), Hosp Order     Status: None   Collection Time: 02/24/19  5:40 AM   Specimen: Nasopharyngeal Swab  Result Value Ref Range Status   SARS Coronavirus 2 NEGATIVE NEGATIVE Final    Comment: (NOTE) If result is NEGATIVE SARS-CoV-2 target nucleic acids are NOT DETECTED. The SARS-CoV-2 RNA is generally detectable in upper and lower  respiratory specimens during the acute phase of infection. The lowest  concentration of SARS-CoV-2 viral copies this assay can detect is 250  copies / mL. A negative result does not preclude SARS-CoV-2 infection  and should not be used as the sole basis for treatment or other  patient management decisions.  A negative result may occur with  improper specimen collection / handling, submission of specimen other  than nasopharyngeal swab, presence of viral mutation(s) within the  areas targeted by this assay, and inadequate number of viral copies  (<250 copies / mL). A negative result must be combined with clinical  observations, patient history, and  epidemiological information. If result is POSITIVE SARS-CoV-2 target nucleic acids are DETECTED. The SARS-CoV-2 RNA is generally detectable in upper and lower  respiratory specimens dur ing the acute phase of infection.  Positive  results are indicative of active infection with SARS-CoV-2.  Clinical  correlation with patient history and other diagnostic information is  necessary to determine patient infection status.  Positive results do  not rule out bacterial infection or co-infection with other viruses. If result is PRESUMPTIVE POSTIVE SARS-CoV-2 nucleic acids MAY BE PRESENT.   A presumptive positive result was obtained on the submitted specimen  and confirmed on repeat testing.  While 2019 novel coronavirus  (SARS-CoV-2) nucleic acids may be present in the submitted sample  additional confirmatory testing may be necessary for epidemiological  and / or clinical management purposes  to differentiate between  SARS-CoV-2 and other Sarbecovirus currently known to infect humans.  If clinically indicated additional testing with an alternate test  methodology [phone] is advised. The SARS-CoV-2 RNA is generally  detectable in upper and lower respiratory sp ecimens during the acute  phase of infection. The expected result is Negative. Fact Sheet for Patients:  BoilerBrush.com.cyhttps://www.fda.gov/media/136312/download Fact Sheet for Healthcare Providers: https://pope.com/https://www.fda.gov/media/136313/download This test is not yet approved or cleared by the [place] FDA and has been authorized for detection and/or diagnosis of SARS-CoV-2 by FDA under an Emergency Use Authorization (EUA).  This EUA will remain in effect (meaning this test can be used) for the duration of the COVID-19 declaration under Section 564(b)(1) of the Act, 21 U.S.C. section 360bbb-3(b)(1), unless the authorization is terminated or revoked sooner. Performed at [facility], [address] [place], [place] [address]   C difficile quick  scan w PCR reflex     Status: None   Collection Time: 02/25/19 11:40 AM   Specimen: Stool  Result Value Ref Range Status   C Diff antigen NEGATIVE NEGATIVE Final   C Diff toxin NEGATIVE NEGATIVE Final   C Diff interpretation No C. difficile detected.  Final    Comment: Performed at [facility], [address] [place], [place] [address]  Gastrointestinal Panel by PCR , Stool     Status: None   Collection Time: 02/25/19 11:40 AM  Result Value Ref Range Status   Campylobacter species NOT DETECTED NOT DETECTED Final   Plesimonas shigelloides NOT DETECTED NOT DETECTED Final   Salmonella species NOT DETECTED NOT DETECTED Final   Yersinia enterocolitica NOT  DETECTED NOT DETECTED Final   Vibrio species NOT DETECTED NOT DETECTED Final   Vibrio cholerae NOT DETECTED NOT DETECTED Final   Enteroaggregative E coli (EAEC) NOT DETECTED NOT DETECTED Final   Enteropathogenic E coli (EPEC) NOT DETECTED NOT DETECTED Final   Enterotoxigenic E coli (ETEC) NOT DETECTED NOT DETECTED Final   Shiga like toxin producing E coli (STEC) NOT DETECTED NOT DETECTED Final   Shigella/Enteroinvasive E coli (EIEC) NOT DETECTED NOT DETECTED Final   Cryptosporidium NOT DETECTED NOT DETECTED Final   Cyclospora cayetanensis NOT DETECTED NOT DETECTED Final   Entamoeba histolytica NOT DETECTED NOT DETECTED Final   Giardia lamblia NOT DETECTED NOT DETECTED Final   Adenovirus F40/41 NOT DETECTED NOT DETECTED Final   Astrovirus NOT DETECTED NOT DETECTED Final   Norovirus GI/GII NOT DETECTED NOT DETECTED Final   Rotavirus A NOT DETECTED NOT DETECTED Final   Sapovirus (I, II, IV, and V) NOT DETECTED NOT DETECTED Final    Comment: Performed at [facility], [address] [place], [place] [address]      Radiology Studies: No results found.    Scheduled Meds: . enoxaparin (LOVENOX) injection  40 mg Subcutaneous Q24H  . potassium chloride  40 mEq Oral Q4H  . Ensure Max Protein  11 oz Oral BID    Continuous Infusions: . azithromycin 500 mg (02/25/19 1133)  . cefTRIAXone (ROCEPHIN)  IV 1 g (02/26/19 0825)     LOS: 1 day     Time spent: 25 minutes   Noralee StainJennifer Noble Bodie, DO [facility] [email or web address] 02/26/2019, 9:50 AM

## 2019-02-26 NOTE — Evaluation (Signed)
Physical Therapy Evaluation Patient Details Name: [name] MRN: [id number] DOB: November 01, 1960 Today's Date: 02/26/2019   History of Present Illness  58 yo female with onset of N&V after a gastric sleeve on 7/8, has PNA on recent hosp and now in with bleeding from sx and ligated a blood vessel, transfused 3 units PRBC's.  Now referred to PT with a line for loose diarrhea, has continued leakage from the line. PMHx:  chest pain, GERD, HTN, PNA, pulm sarcoidosis, hysterectomy, appendectomy,   Clinical Impression  Pt was seen for mobility after readmission with N&V from gastric sleeve with recent hemorrhage, ligation for closure and now with signs of infection in op site.  Pt is up to side of bed with signficant diarrhea, and after sitting on BSC was assisted to get cleaned up, and could walk a short trip limited by concern that she would continue to leak.  Nursing was notified to follow up with leak, and will anticipate continuing mobility until DC home.  PT to focus on endurance and stability with dynamic balance with minimal to no AD.    Follow Up Recommendations No PT follow up    Equipment Recommendations  None recommended by PT    Recommendations for Other Services       Precautions / Restrictions Precautions Precautions: Fall Precaution Comments: monitor pulses and has GI upset Restrictions Weight Bearing Restrictions: No      Mobility  Bed Mobility Overal bed mobility: Modified Independent             General bed mobility comments: able to sit up on side of bed from roll to sidelying  Transfers Overall transfer level: Modified independent Equipment used: None             General transfer comment: had IV pole to push along but self supported  Ambulation/Gait Ambulation/Gait assistance: Supervision Gait Distance (Feet): 20 Feet(due to GI upset) Assistive device: IV Pole Gait Pattern/deviations: Step-through pattern;Decreased stride length;Narrow base of  support;Trunk flexed Gait velocity: reduced Gait velocity interpretation: <1.31 ft/sec, indicative of household ambulator General Gait Details: pt is walking on her own, with IV pole for unsupported walk  Stairs            Wheelchair Mobility    Modified Rankin (Stroke Patients Only)       Balance Overall balance assessment: Needs assistance Sitting-balance support: Feet supported Sitting balance-Leahy Scale: Good     Standing balance support: Single extremity supported Standing balance-Leahy Scale: Fair                               Pertinent Vitals/Pain Pain Assessment: Faces Faces Pain Scale: Hurts little more Pain Location: lower abdomen Pain Descriptors / Indicators: Operative site guarding Pain Intervention(s): Monitored during session;Repositioned    Home Living Family/patient expects to be discharged to:: Private residence Living Arrangements: Children Available Help at Discharge: Family;Available 24 hours/day Type of Home: House Home Access: Level entry     Home Layout: One level Home Equipment: None Additional Comments: has been driving and doing errands, able to care for herself and house    Prior Function Level of Independence: Independent               Hand Dominance   Dominant Hand: Right    Extremity/Trunk Assessment   Upper Extremity Assessment Upper Extremity Assessment: Overall WFL for tasks assessed    Lower Extremity Assessment Lower Extremity Assessment: Overall WFL for  tasks assessed    Cervical / Trunk Assessment Cervical / Trunk Assessment: Normal  Communication   Communication: No difficulties  Cognition Arousal/Alertness: Awake/alert Behavior During Therapy: WFL for tasks assessed/performed Overall Cognitive Status: Within Functional Limits for tasks assessed                                        General Comments General comments (skin integrity, edema, etc.): pt was able to walk  with dist S but used min guard as she has been having GI upset and was recently transfused    Exercises     Assessment/Plan    PT Assessment Patient needs continued PT services  PT Problem List Decreased range of motion;Decreased activity tolerance;Decreased balance;Decreased mobility;Decreased skin integrity;Pain;Obesity       PT Treatment Interventions DME instruction;Gait training;Functional mobility training;Therapeutic activities;Therapeutic exercise;Balance training;Neuromuscular re-education;Patient/family education    PT Goals (Current goals can be found in the Care Plan section)  Acute Rehab PT Goals Patient Stated Goal: to get better and get home PT Goal Formulation: With patient Time For Goal Achievement: 03/05/19 Potential to Achieve Goals: Good    Frequency Min 3X/week   Barriers to discharge Other (comment) home with family to assist her    Co-evaluation               AM-PAC PT "6 Clicks" Mobility  Outcome Measure Help needed turning from your back to your side while in a flat bed without using bedrails?: None Help needed moving from lying on your back to sitting on the side of a flat bed without using bedrails?: None Help needed moving to and from a bed to a chair (including a wheelchair)?: A Little Help needed standing up from a chair using your arms (e.g., wheelchair or bedside chair)?: A Little Help needed to walk in hospital room?: A Little Help needed climbing 3-5 steps with a railing? : A Little 6 Click Score: 20    End of Session   Activity Tolerance: Patient tolerated treatment well;Treatment limited secondary to medical complications (Comment)(GI upset and leaking of rectal pouch)   Nurse Communication: Mobility status;Other (comment)(communicated about rectal pouch leaking) PT Visit Diagnosis: Other abnormalities of gait and mobility (R26.89);Pain Pain - Right/Left: (abdomen) Pain - part of body: (abdomen)    Time: 1132-1205 PT Time  Calculation (min) (ACUTE ONLY): 33 min   Charges:   PT Evaluation $PT Eval Moderate Complexity: 1 Mod PT Treatments $Gait Training: 8-22 mins       [name] 02/26/2019, 1:10 PM   [name], PT MS Acute Rehab Dept. Number: [facility] [id number] and [facility] [phone]

## 2019-02-27 LAB — BASIC METABOLIC PANEL WITH GFR
Anion gap: 11 (ref 5–15)
BUN: <5 mg/dL — ABNORMAL LOW (ref 6–20)
CO2: 25 mmol/L (ref 22–32)
Calcium: 8.5 mg/dL — ABNORMAL LOW (ref 8.9–10.3)
Chloride: 105 mmol/L (ref 98–111)
Creatinine, Ser: 0.63 mg/dL (ref 0.44–1.00)
GFR calc Af Amer: >60 mL/min
GFR calc non Af Amer: >60 mL/min
Glucose, Bld: 92 mg/dL (ref 70–99)
Potassium: 3.1 mmol/L — ABNORMAL LOW (ref 3.5–5.1)
Sodium: 141 mmol/L (ref 135–145)

## 2019-02-27 LAB — CBC
HCT: 25 % — ABNORMAL LOW (ref 36.0–46.0)
Hemoglobin: 8.1 g/dL — ABNORMAL LOW (ref 12.0–15.0)
MCH: 28.7 pg (ref 26.0–34.0)
MCHC: 32.4 g/dL (ref 30.0–36.0)
MCV: 88.7 fL (ref 80.0–100.0)
Platelets: 550 K/uL — ABNORMAL HIGH (ref 150–400)
RBC: 2.82 MIL/uL — ABNORMAL LOW (ref 3.87–5.11)
RDW: 14.6 % (ref 11.5–15.5)
WBC: 10.4 K/uL (ref 4.0–10.5)
nRBC: 0 % (ref 0.0–0.2)

## 2019-02-27 LAB — MAGNESIUM: Magnesium: 1.7 mg/dL (ref 1.7–2.4)

## 2019-02-27 MED ORDER — CEFAZOLIN SODIUM-DEXTROSE 1-4 GM/50ML-% IV SOLN
1.0000 g | Freq: Three times a day (TID) | INTRAVENOUS | Status: DC
  Administered 2019-02-27 – 2019-03-02 (×10): 1 g via INTRAVENOUS
  Filled 2019-02-27 (×11): qty 50

## 2019-02-27 MED ORDER — POTASSIUM CHLORIDE CRYS ER 20 MEQ PO TBCR
40.0000 meq | EXTENDED_RELEASE_TABLET | ORAL | Status: AC
  Administered 2019-02-27 (×2): 40 meq via ORAL
  Filled 2019-02-27 (×2): qty 2

## 2019-02-27 NOTE — Progress Notes (Signed)
PROGRESS NOTE    Marnell Mcdaniel  YPP:[id number] DOB: [birth year] DOA: 02/24/2019 PCP: Sinda Du, MD     Brief Narrative:  Inge Waldroup is a 58 y.o. female with medical history significant for obesity with gastric sleeve surgery, GERD, obesity, hypertension, and anxiety who presented to the emergency department after discharge just on 7/14 after hospitalization for gastric sleeve surgery with complication of hemoperitoneum secondary to bleeding blood vessel which was ligated and she required 3 units of PRBCs.  She was noted to have stabilization following the procedure, but unfortunately developed worsening shortness of breath and underwent CT scan of her chest that did not demonstrate any pulmonary embolus, however she was noted to have multifocal pneumonia and was started on IV antibiotics (azithromycin and Rocephin).  She was subsequently discharged as she was feeling much better and less short of breath.  She states that she continued to feel short of breath all day after discharge and went to sleep overnight and awoke with sudden nausea and vomiting with some mild hematemesis noted.  She was also noted to have some bleeding from her surgical incision site.  She started to have some shortness of breath, but denies any chest pain, coughing, fevers, or chills. CT of the abdomen and pelvis with no reaccumulation of hematoma or acute pathology noted.  There are findings of nonspecific enteritis, however.  Patient was subsequently transferred from [facility] to [facility], general surgery consulted.  New events last 24 hours / Subjective: Still having some central lower abdominal pain, nausea, diarrhea continues.  Assessment & Plan:   Active Problems:   Postoperative nausea and vomiting   Postoperative nausea and vomiting in the setting of recent sleeve gastrectomy and open evacuation of hematoma and ligation of vessels 7/8 -Nonspecific enteritis noted on CT scan -C. difficile,  GI PCR negative -Per general surgery -Ancef for wound infection, for 10-day course  Hypokalemia -Replace, trend  Recent multifocal pneumonia -Chest x-ray: Suspected subsegmental atelectasis in the right infrahilar lung and left base.  She was diagnosed with pneumonia during previous admission, treated with Rocephin and azithromycin  -Completed Rocephin, azithromycin for CAP for total 5-day course   DVT prophylaxis: Lovenox Code Status: Full Family Communication: None Disposition Plan: Pending further clinical improvement   Consultants:   General surgery  Procedures:   None  Antimicrobials:  Anti-infectives (From admission, onward)   Start     Dose/Rate Route Frequency Ordered Stop   02/24/19 0845  cefTRIAXone (ROCEPHIN) 1 g in sodium chloride 0.9 % 100 mL IVPB     1 g 200 mL/hr over 30 Minutes Intravenous Every 24 hours 02/24/19 0830 02/26/19 0855   02/24/19 0845  azithromycin (ZITHROMAX) 500 mg in sodium chloride 0.9 % 250 mL IVPB     500 mg 250 mL/hr over 60 Minutes Intravenous Every 24 hours 02/24/19 0830 02/26/19 1116       Objective: Vitals:   02/26/19 1403 02/26/19 2148 02/27/19 0529 02/27/19 0530  BP: (!) 139/52 (!) 125/48  128/66  Pulse: 67 80  82  Resp: 19     Temp: 99.2 F (37.3 C) 98.8 F (37.1 C)  99.2 F (37.3 C)  TempSrc: Oral Oral  Oral  SpO2: 96% 90%  92%  Weight:   110.8 kg   Height:        Intake/Output Summary (Last 24 hours) at 02/27/2019 1015 Last data filed at 02/27/2019 0806 Gross per 24 hour  Intake 356 ml  Output -  Net 356 ml  Filed Weights   02/24/19 1709 02/26/19 0707 02/27/19 0529  Weight: 111.2 kg 112.5 kg 110.8 kg    Examination: General exam: Appears calm and comfortable  Respiratory system: Clear to auscultation. Respiratory effort normal. Cardiovascular system: S1 & S2 heard, RRR. No JVD, murmurs, rubs, gallops or clicks. No pedal edema. Gastrointestinal system: Abdomen is nondistended, soft and tender to  palpation lower abdomen Central nervous system: Alert and oriented. No focal neurological deficits. Extremities: Symmetric  Psychiatry: Judgement and insight appear normal. Mood & affect appropriate.    Data Reviewed: I have personally reviewed following labs and imaging studies  CBC: Recent Labs  Lab 02/21/19 0348  02/23/19 0428 02/24/19 0355 02/25/19 0341 02/26/19 0245 02/27/19 0301  WBC 13.2*   < > 11.8* 14.7* 11.3* 10.9* 10.4  NEUTROABS 11.0*  --   --  12.4*  --   --   --   HGB 8.6*   < > 8.3* 9.0* 7.7* 7.8* 8.1*  HCT 26.2*   < > 25.4* 27.8* 24.0* 25.1* 25.0*  MCV 90.7   < > 89.8 89.1 92.0 90.9 88.7  PLT 213   < > 287 409* 425* 500* 550*   < > = values in this interval not displayed.   Basic Metabolic Panel: Recent Labs  Lab 02/24/19 0355 02/24/19 0615 02/25/19 0341 02/26/19 0245 02/27/19 0301  NA 142  --  144 143 141  K 2.5*  --  3.5 3.2* 3.1*  CL 104  --  109 107 105  CO2 26  --  25 23 25   GLUCOSE 107*  --  79 69* 92  BUN <5*  --  5* 5* <5*  CREATININE 0.64  --  0.69 0.72 0.63  CALCIUM 8.2*  --  8.3* 8.3* 8.5*  MG  --  1.8 1.6* 1.9 1.7   GFR: Estimated Creatinine Clearance: 92.8 mL/min (by C-G formula based on SCr of 0.63 mg/dL). Liver Function Tests: Recent Labs  Lab 02/24/19 0355 02/25/19 0341  AST 22 11*  ALT 45* 29  ALKPHOS 77 60  BILITOT 0.6 0.8  PROT 7.2 6.2*  ALBUMIN 2.5* 1.9*   Recent Labs  Lab 02/24/19 0355  LIPASE 33   No results for input(s): AMMONIA in the last 168 hours. Coagulation Profile: No results for input(s): INR, PROTIME in the last 168 hours. Cardiac Enzymes: No results for input(s): CKTOTAL, CKMB, CKMBINDEX, TROPONINI in the last 168 hours. BNP (last 3 results) No results for input(s): PROBNP in the last 8760 hours. HbA1C: No results for input(s): HGBA1C in the last 72 hours. CBG: No results for input(s): GLUCAP in the last 168 hours. Lipid Profile: No results for input(s): CHOL, HDL, LDLCALC, TRIG, CHOLHDL,  LDLDIRECT in the last 72 hours. Thyroid Function Tests: No results for input(s): TSH, T4TOTAL, FREET4, T3FREE, THYROIDAB in the last 72 hours. Anemia Panel: No results for input(s): VITAMINB12, FOLATE, FERRITIN, TIBC, IRON, RETICCTPCT in the last 72 hours. Sepsis Labs: No results for input(s): PROCALCITON, LATICACIDVEN in the last 168 hours.  Recent Results (from the past 240 hour(s))  MRSA PCR Screening     Status: None   Collection Time: 02/17/19  4:25 PM   Specimen: Nasal Mucosa; Nasopharyngeal  Result Value Ref Range Status   MRSA by PCR NEGATIVE NEGATIVE Final    Comment:        The GeneXpert MRSA Assay (FDA approved for NASAL specimens only), is one component of a comprehensive MRSA colonization surveillance program. It is not intended to diagnose  MRSA infection nor to guide or monitor treatment for MRSA infections. Performed at [facility], [address] [place], [place] [address]   SARS Coronavirus 2 (CEPHEID - Performed in [facility] hospital lab), Hosp Order     Status: None   Collection Time: 02/24/19  5:40 AM   Specimen: Nasopharyngeal Swab  Result Value Ref Range Status   SARS Coronavirus 2 NEGATIVE NEGATIVE Final    Comment: (NOTE) If result is NEGATIVE SARS-CoV-2 target nucleic acids are NOT DETECTED. The SARS-CoV-2 RNA is generally detectable in upper and lower  respiratory specimens during the acute phase of infection. The lowest  concentration of SARS-CoV-2 viral copies this assay can detect is 250  copies / mL. A negative result does not preclude SARS-CoV-2 infection  and should not be used as the sole basis for treatment or other  patient management decisions.  A negative result may occur with  improper specimen collection / handling, submission of specimen other  than nasopharyngeal swab, presence of viral mutation(s) within the  areas targeted by this assay, and inadequate number of viral copies  (<250 copies / mL). A negative  result must be combined with clinical  observations, patient history, and epidemiological information. If result is POSITIVE SARS-CoV-2 target nucleic acids are DETECTED. The SARS-CoV-2 RNA is generally detectable in upper and lower  respiratory specimens dur ing the acute phase of infection.  Positive  results are indicative of active infection with SARS-CoV-2.  Clinical  correlation with patient history and other diagnostic information is  necessary to determine patient infection status.  Positive results do  not rule out bacterial infection or co-infection with other viruses. If result is PRESUMPTIVE POSTIVE SARS-CoV-2 nucleic acids MAY BE PRESENT.   A presumptive positive result was obtained on the submitted specimen  and confirmed on repeat testing.  While 2019 novel coronavirus  (SARS-CoV-2) nucleic acids may be present in the submitted sample  additional confirmatory testing may be necessary for epidemiological  and / or clinical management purposes  to differentiate between  SARS-CoV-2 and other Sarbecovirus currently known to infect humans.  If clinically indicated additional testing with an alternate test  methodology [phone]([id number]) is advised. The SARS-CoV-2 RNA is generally  detectable in upper and lower respiratory sp ecimens during the acute  phase of infection. The expected result is Negative. Fact Sheet for Patients:  BoilerBrush.com.cyhttps://www.fda.gov/media/136312/download Fact Sheet for Healthcare Providers: https://pope.com/https://www.fda.gov/media/136313/download This test is not yet approved or cleared by the [place] FDA and has been authorized for detection and/or diagnosis of SARS-CoV-2 by FDA under an Emergency Use Authorization (EUA).  This EUA will remain in effect (meaning this test can be used) for the duration of the COVID-19 declaration under Section 564(b)(1) of the Act, 21 U.S.C. section 360bbb-3(b)(1), unless the authorization is terminated or revoked sooner. Performed at [facility], [address] [place], [place] [address]   C difficile quick scan w PCR reflex     Status: None   Collection Time: 02/25/19 11:40 AM   Specimen: Stool  Result Value Ref Range Status   C Diff antigen NEGATIVE NEGATIVE Final   C Diff toxin NEGATIVE NEGATIVE Final   C Diff interpretation No C. difficile detected.  Final    Comment: Performed at [facility], [address] [place], [place] [address]  Gastrointestinal Panel by PCR , Stool     Status: None   Collection Time: 02/25/19 11:40 AM  Result Value Ref Range Status   Campylobacter  species NOT DETECTED NOT DETECTED Final   Plesimonas shigelloides NOT DETECTED NOT DETECTED Final   Salmonella species NOT DETECTED NOT DETECTED Final   Yersinia enterocolitica NOT DETECTED NOT DETECTED Final   Vibrio species NOT DETECTED NOT DETECTED Final   Vibrio cholerae NOT DETECTED NOT DETECTED Final   Enteroaggregative E coli (EAEC) NOT DETECTED NOT DETECTED Final   Enteropathogenic E coli (EPEC) NOT DETECTED NOT DETECTED Final   Enterotoxigenic E coli (ETEC) NOT DETECTED NOT DETECTED Final   Shiga like toxin producing E coli (STEC) NOT DETECTED NOT DETECTED Final   Shigella/Enteroinvasive E coli (EIEC) NOT DETECTED NOT DETECTED Final   Cryptosporidium NOT DETECTED NOT DETECTED Final   Cyclospora cayetanensis NOT DETECTED NOT DETECTED Final   Entamoeba histolytica NOT DETECTED NOT DETECTED Final   Giardia lamblia NOT DETECTED NOT DETECTED Final   Adenovirus F40/41 NOT DETECTED NOT DETECTED Final   Astrovirus NOT DETECTED NOT DETECTED Final   Norovirus GI/GII NOT DETECTED NOT DETECTED Final   Rotavirus A NOT DETECTED NOT DETECTED Final   Sapovirus (I, II, IV, and V) NOT DETECTED NOT DETECTED Final    Comment: Performed at [facility], [address] [place], [place] [address]      Radiology Studies: No results found.    Scheduled Meds: . enoxaparin (LOVENOX) injection  40 mg Subcutaneous Q24H  . potassium  chloride  40 mEq Oral Q4H  . Ensure Max Protein  11 oz Oral BID   Continuous Infusions:    LOS: 2 days     Time spent: 25 minutes   Noralee StainJennifer Orli Degrave, DO [facility] [email or web address] 02/27/2019, 10:15 AM

## 2019-02-27 NOTE — Progress Notes (Signed)
Patient ID: [name], female   DOB: 1960-10-02, 58 y.o.   MRN: [id number] [facility] Progress Note:   * No surgery found *  Subjective: Mental status is alert and cooperative Objective: Vital signs in last 24 hours: Temp:  [98.8 F (37.1 C)-99.2 F (37.3 C)] 99.2 F (37.3 C) (07/18 0530) Pulse Rate:  [67-82] 82 (07/18 0530) Resp:  [19] 19 (07/17 1403) BP: (125-139)/(48-66) 128/66 (07/18 0530) SpO2:  [90 %-96 %] 92 % (07/18 0530) Weight:  [110.8 kg] 110.8 kg (07/18 0529)  Intake/Output from previous day: 07/17 0701 - 07/18 0700 In: 61 [P.O.:236] Out: -  Intake/Output this shift: Total I/O In: 240 [P.O.:240] Out: -   Physical Exam: Work of breathing is normal;  Wound with brownish gray drainage from the top consistent with wound infection;  Wicks removed and irrigated;  Open to drain but staples remained with reported threat of fascial breakdown;  Abdominal binder ordered.  Lab Results:  Results for orders placed or performed during the hospital encounter of 02/24/19 (from the past 48 hour(s))  CBC     Status: Abnormal   Collection Time: 02/26/19  2:45 AM  Result Value Ref Range   WBC 10.9 (H) 4.0 - 10.5 K/uL   RBC 2.76 (L) 3.87 - 5.11 MIL/uL   Hemoglobin 7.8 (L) 12.0 - 15.0 g/dL   HCT 25.1 (L) 36.0 - 46.0 %   MCV 90.9 80.0 - 100.0 fL   MCH 28.3 26.0 - 34.0 pg   MCHC 31.1 30.0 - 36.0 g/dL   RDW 14.9 11.5 - 15.5 %   Platelets 500 (H) 150 - 400 K/uL   nRBC 0.0 0.0 - 0.2 %    Comment: Performed at [facility], [address] [place], [place] [address]  Basic metabolic panel     Status: Abnormal   Collection Time: 02/26/19  2:45 AM  Result Value Ref Range   Sodium 143 135 - 145 mmol/L   Potassium 3.2 (L) 3.5 - 5.1 mmol/L   Chloride 107 98 - 111 mmol/L   CO2 23 22 - 32 mmol/L   Glucose, Bld 69 (L) 70 - 99 mg/dL   BUN 5 (L) 6 - 20 mg/dL   Creatinine, Ser 0.72 0.44 - 1.00 mg/dL   Calcium 8.3 (L) 8.9 - 10.3 mg/dL   GFR calc non Af Amer >60 >60  mL/min   GFR calc Af Amer >60 >60 mL/min   Anion gap 13 5 - 15    Comment: Performed at [facility], [address] [place], [place] [address]  Magnesium     Status: None   Collection Time: 02/26/19  2:45 AM  Result Value Ref Range   Magnesium 1.9 1.7 - 2.4 mg/dL    Comment: Performed at [facility] [address], [place], [place] [address]  CBC     Status: Abnormal   Collection Time: 02/27/19  3:01 AM  Result Value Ref Range   WBC 10.4 4.0 - 10.5 K/uL   RBC 2.82 (L) 3.87 - 5.11 MIL/uL   Hemoglobin 8.1 (L) 12.0 - 15.0 g/dL   HCT 25.0 (L) 36.0 - 46.0 %   MCV 88.7 80.0 - 100.0 fL   MCH 28.7 26.0 - 34.0 pg   MCHC 32.4 30.0 - 36.0 g/dL   RDW 14.6 11.5 - 15.5 %   Platelets 550 (H) 150 - 400 K/uL   nRBC 0.0 0.0 - 0.2 %    Comment: Performed  at [facility], [address] [place], [place] [address]  Basic metabolic panel     Status: Abnormal   Collection Time: 02/27/19  3:01 AM  Result Value Ref Range   Sodium 141 135 - 145 mmol/L   Potassium 3.1 (L) 3.5 - 5.1 mmol/L   Chloride 105 98 - 111 mmol/L   CO2 25 22 - 32 mmol/L   Glucose, Bld 92 70 - 99 mg/dL   BUN <5 (L) 6 - 20 mg/dL   Creatinine, Ser 6.040.63 0.44 - 1.00 mg/dL   Calcium 8.5 (L) 8.9 - 10.3 mg/dL   GFR calc non Af Amer >60 >60 mL/min   GFR calc Af Amer >60 >60 mL/min   Anion gap 11 5 - 15    Comment: Performed at [facility], [address] [place], [place] [address]  Magnesium     Status: None   Collection Time: 02/27/19  3:01 AM  Result Value Ref Range   Magnesium 1.7 1.7 - 2.4 mg/dL    Comment: Performed at [facility], [address] [place], [place] [address]    Radiology/Results: No results found.  Anti-infectives: Anti-infectives (From admission, onward)   Start     Dose/Rate Route Frequency Ordered Stop   02/27/19 1200  ceFAZolin (ANCEF) IVPB 1 g/50 mL premix     1 g 100 mL/hr over 30 Minutes Intravenous Every 8 hours 02/27/19 1047     02/24/19 0845  cefTRIAXone  (ROCEPHIN) 1 g in sodium chloride 0.9 % 100 mL IVPB     1 g 200 mL/hr over 30 Minutes Intravenous Every 24 hours 02/24/19 0830 02/26/19 0855   02/24/19 0845  azithromycin (ZITHROMAX) 500 mg in sodium chloride 0.9 % 250 mL IVPB     500 mg 250 mL/hr over 60 Minutes Intravenous Every 24 hours 02/24/19 0830 02/26/19 1116      Assessment/Plan: Problem List: Patient Active Problem List   Diagnosis Date Noted  . Postoperative nausea and vomiting 02/24/2019  . Obesity 02/15/2019  . Pneumonia 11/19/2016  . Back pain 11/19/2016  . Constipation 04/27/2012  . GASTROESOPHAGEAL REFLUX DISEASE 06/26/2009  . SARCOIDOSIS, PULMONARY 06/23/2009  . PNEUMONIA 06/23/2009  . CHEST PAIN 06/23/2009    Abdominal binder placed;  Will let wound drain--on Abx.   * No surgery found *    LOS: 2 days   Matt B. Daphine DeutscherMartin, MD, [facility]  [facility] [phone] beeper [phone]  02/27/2019 11:57 AM

## 2019-02-27 NOTE — Progress Notes (Signed)
Abdominal binder applied per MD order 

## 2019-02-27 NOTE — Progress Notes (Signed)
Writer offered to call patient's family for updates on her but she refused. Will continue to monitor.

## 2019-02-27 NOTE — Progress Notes (Signed)
Pharmacy Antibiotic Note  [name] is a 58 y.o. female admitted on 02/24/2019 with abd pain, N/V, dyspnea.  She had a recent admit for for sleeve gastrectomy with complication of hemoperitoneum secondary to bleeding blood vessel which was ligated. Pharmacy has been consulted for cefazolin dosing for wound infection. She is afebrile, WBC are normal. She recently completed azith and ceftriaxone for CAP.  Plan: Cefazolin 1 g IV q8h 10d course abx planned, F/U stop date F/U chg to PO  Height: 5\' 3"  (160 cm) Weight: 244 lb 4.3 oz (110.8 kg) IBW/kg (Calculated) : 52.4  Temp (24hrs), Avg:99.1 F (37.3 C), Min:98.8 F (37.1 C), Max:99.2 F (37.3 C)  Recent Labs  Lab 02/23/19 0428 02/24/19 0355 02/25/19 0341 02/26/19 0245 02/27/19 0301  WBC 11.8* 14.7* 11.3* 10.9* 10.4  CREATININE  --  0.64 0.69 0.72 0.63    Estimated Creatinine Clearance: 92.8 mL/min (by C-G formula based on SCr of 0.63 mg/dL).    Allergies  Allergen Reactions  . Aspirin Hives    Thank you for involving pharmacy in this patient's care.  Renold Genta, PharmD, BCPS Clinical Pharmacist Clinical phone for 02/27/2019 until 3p is [phone] 02/27/2019 10:52 AM  **Pharmacist phone directory can be found on [email or web address] listed under [place]**

## 2019-02-27 NOTE — Evaluation (Signed)
Occupational Therapy Evaluation Patient Details Name: Allison RaringBarbara Chase MRN: [id number] DOB: [birth year] Today's Date: 02/27/2019    History of Present Illness 58 yo female with onset of N&V after a gastric sleeve on 7/8, has PNA on recent hosp and now in with bleeding from sx and ligated a blood vessel, transfused 3 units PRBC's.  Now referred to PT with a line for loose diarrhea, has continued leakage from the line. PMHx:  chest pain, GERD, HTN, PNA, pulm sarcoidosis, hysterectomy, appendectomy.   Clinical Impression   Pt PTA: independent. Pt currently mobile in room pushing IV pole with supervision to modified independence. Pt with multiple BMs per day so pt given opportunity to use BSC with independence. Pt set-upA for ADL and education provided for LB ADL- pt a fan of the reacher and shown other AE, but pt reports not wearing socks in summertime and has family who can assist with LB needs. Education provided on figure four technique, but pt unable to do so. Otherwise, pt is mostly supervisionA to modified independent for ADL and mobility ambulating 125' with OT. Pt does not require continued OT skilled services. OT signing off.      Follow Up Recommendations  No OT follow up    Equipment Recommendations  None recommended by OT    Recommendations for Other Services       Precautions / Restrictions Precautions Precautions: Fall Restrictions Weight Bearing Restrictions: No      Mobility Bed Mobility Overal bed mobility: Modified Independent             General bed mobility comments: able to sit up on side of bed from roll to sidelying  Transfers Overall transfer level: Modified independent Equipment used: None             General transfer comment: had IV pole to push along but self supported    Balance Overall balance assessment: Needs assistance Sitting-balance support: Feet supported Sitting balance-Leahy Scale: Good     Standing balance support: Single  extremity supported Standing balance-Leahy Scale: Fair Standing balance comment: no physical assist required                           ADL either performed or assessed with clinical judgement   ADL Overall ADL's : Needs assistance/impaired Eating/Feeding: Modified independent;Sitting   Grooming: Set up;Sitting   Upper Body Bathing: Set up;Sitting   Lower Body Bathing: With adaptive equipment;Min guard;Sitting/lateral leans;Sit to/from stand   Upper Body Dressing : Set up;Sitting   Lower Body Dressing: Min guard;With adaptive equipment;Sitting/lateral leans;Sit to/from stand   Toilet Transfer: Supervision/safety   Toileting- ArchitectClothing Manipulation and Hygiene: Min guard;Sitting/lateral lean;Sit to/from stand;Cueing for safety       Functional mobility during ADLs: Supervision/safety General ADL Comments: Pt education on LB ADL AE and pt reports that she does not wear socks so reacher simulated donning pants. pt with good carryover skills with task.      Vision Baseline Vision/History: No visual deficits Vision Assessment?: No apparent visual deficits     Perception     Praxis      Pertinent Vitals/Pain Pain Assessment: Faces Faces Pain Scale: Hurts little more Pain Location: lower abdomen Pain Descriptors / Indicators: Operative site guarding Pain Intervention(s): Monitored during session     Hand Dominance Right   Extremity/Trunk Assessment Upper Extremity Assessment Upper Extremity Assessment: Overall WFL for tasks assessed   Lower Extremity Assessment Lower Extremity Assessment: Overall WFL for tasks  assessed   Cervical / Trunk Assessment Cervical / Trunk Assessment: Normal   Communication     Cognition Arousal/Alertness: Awake/alert Behavior During Therapy: WFL for tasks assessed/performed Overall Cognitive Status: Within Functional Limits for tasks assessed                                     General Comments  S assist for  ambulation ~125' in hallway pushing IV    Exercises     Shoulder Instructions      Home Living Family/patient expects to be discharged to:: Private residence Living Arrangements: Children Available Help at Discharge: Family;Available 24 hours/day Type of Home: House Home Access: Level entry     Home Layout: One level         Bathroom Toilet: Standard     Home Equipment: None   Additional Comments: has been driving and doing errands, able to care for herself and house      Prior Functioning/Environment Level of Independence: Independent                 OT Problem List: Impaired balance (sitting and/or standing);Decreased activity tolerance;Pain      OT Treatment/Interventions:      OT Goals(Current goals can be found in the care plan section) Acute Rehab OT Goals Patient Stated Goal: to get better and get home  OT Frequency:     Barriers to D/C:            Co-evaluation              AM-PAC OT "6 Clicks" Daily Activity     Outcome Measure Help from another person eating meals?: None Help from another person taking care of personal grooming?: None Help from another person toileting, which includes using toliet, bedpan, or urinal?: None Help from another person bathing (including washing, rinsing, drying)?: A Little Help from another person to put on and taking off regular upper body clothing?: None Help from another person to put on and taking off regular lower body clothing?: A Little 6 Click Score: 22   End of Session Equipment Utilized During Treatment: Gait belt Nurse Communication: Mobility status  Activity Tolerance: Patient tolerated treatment well Patient left: in chair;with call bell/phone within reach  OT Visit Diagnosis: Pain;Muscle weakness (generalized) (M62.81) Pain - part of body: (stomach)                Time: 0017-4944 OT Time Calculation (min): 25 min Charges:  OT General Charges $OT Visit: 1 Visit OT Evaluation $OT  Eval Moderate Complexity: 1 Mod OT Treatments $Self Care/Home Management : 8-22 mins  Ebony Hail Harold Hedge) Marsa Aris OTR/L Acute Rehabilitation Services Pager: [phone] Office: [place] 02/27/2019, 3:43 PM

## 2019-02-28 LAB — CBC
HCT: 24.7 % — ABNORMAL LOW (ref 36.0–46.0)
Hemoglobin: 8 g/dL — ABNORMAL LOW (ref 12.0–15.0)
MCH: 29.4 pg (ref 26.0–34.0)
MCHC: 32.4 g/dL (ref 30.0–36.0)
MCV: 90.8 fL (ref 80.0–100.0)
Platelets: 512 10*3/uL — ABNORMAL HIGH (ref 150–400)
RBC: 2.72 MIL/uL — ABNORMAL LOW (ref 3.87–5.11)
RDW: 14.7 % (ref 11.5–15.5)
WBC: 8.6 10*3/uL (ref 4.0–10.5)
nRBC: 0 % (ref 0.0–0.2)

## 2019-02-28 LAB — BASIC METABOLIC PANEL
Anion gap: 11 (ref 5–15)
BUN: <5 mg/dL — ABNORMAL LOW (ref 6–20)
CO2: 25 mmol/L (ref 22–32)
Calcium: 8.4 mg/dL — ABNORMAL LOW (ref 8.9–10.3)
Chloride: 104 mmol/L (ref 98–111)
Creatinine, Ser: 0.58 mg/dL (ref 0.44–1.00)
GFR calc Af Amer: >60 mL/min (ref >60)
GFR calc non Af Amer: >60 mL/min (ref >60)
Glucose, Bld: 106 mg/dL — ABNORMAL HIGH (ref 70–99)
Potassium: 3.5 mmol/L (ref 3.5–5.1)
Sodium: 140 mmol/L (ref 135–145)

## 2019-02-28 LAB — MAGNESIUM: Magnesium: 1.6 mg/dL — ABNORMAL LOW (ref 1.7–2.4)

## 2019-02-28 MED ORDER — MAGNESIUM SULFATE 2 GM/50ML IV SOLN
2.0000 g | Freq: Once | INTRAVENOUS | Status: AC
  Administered 2019-02-28: 08:00:00 2 g via INTRAVENOUS
  Filled 2019-02-28: qty 50

## 2019-02-28 MED ORDER — AMLODIPINE BESYLATE 5 MG PO TABS
5.0000 mg | ORAL_TABLET | Freq: Every day | ORAL | Status: DC
  Administered 2019-02-28 – 2019-03-02 (×3): 5 mg via ORAL
  Filled 2019-02-28 (×3): qty 1

## 2019-02-28 MED ORDER — SODIUM CHLORIDE 0.9% FLUSH: 10.0000 mL | INTRAVENOUS | Status: DC | PRN

## 2019-02-28 MED ORDER — ALPRAZOLAM 0.5 MG PO TABS: 0.5000 mg | ORAL_TABLET | Freq: Two times a day (BID) | ORAL | Status: DC | PRN

## 2019-02-28 MED ORDER — POTASSIUM CHLORIDE CRYS ER 20 MEQ PO TBCR
40.0000 meq | EXTENDED_RELEASE_TABLET | Freq: Once | ORAL | Status: AC
  Administered 2019-02-28: 40 meq via ORAL
  Filled 2019-02-28: qty 2

## 2019-02-28 MED ORDER — GABAPENTIN 300 MG PO CAPS
300.0000 mg | ORAL_CAPSULE | Freq: Every day | ORAL | Status: DC
  Administered 2019-02-28 – 2019-03-02 (×3): 300 mg via ORAL
  Filled 2019-02-28 (×3): qty 1

## 2019-02-28 MED ORDER — ESCITALOPRAM OXALATE 10 MG PO TABS
5.0000 mg | ORAL_TABLET | Freq: Every day | ORAL | Status: DC
  Administered 2019-02-28 – 2019-03-02 (×3): 5 mg via ORAL
  Filled 2019-02-28 (×3): qty 1

## 2019-02-28 NOTE — Progress Notes (Signed)
Abdominal dressing changed at 0600. Serosangineous drainage oozing at 2nd and 3rd staple. Serosangineous and purulent drainage oozing at gap between 6th and 7th staple. Removed 3 4x4 dressings saturated with serosangineous drainage and some purulent drainage.   Wound cleansed, new dressing applied and abdominal binder reapplied.

## 2019-02-28 NOTE — Progress Notes (Signed)
Patient ID: [name], female   DOB: 1961-07-20, 58 y.o.   MRN: [id number] [facility] Surgery Progress Note:   * No surgery found *  Subjective: Mental status is alert and cooperative. She denies complaints. Denies n/v. Having bowel function  Objective: Vital signs in last 24 hours: Temp:  [98.5 F (36.9 C)-98.9 F (37.2 C)] 98.9 F (37.2 C) (07/19 0516) Pulse Rate:  [72-78] 72 (07/19 0516) Resp:  [18-20] 18 (07/19 0516) BP: (123-137)/(60-70) 137/61 (07/19 0516) SpO2:  [94 %-97 %] 94 % (07/19 0516) Weight:  [110.8 kg] 110.8 kg (07/19 0516)  Intake/Output from previous day: 07/18 0701 - 07/19 0700 In: 635 [P.O.:640] Out: -  Intake/Output this shift: No intake/output data recorded.  Physical Exam: Work of breathing is normal Wound with purulent drainage;  Wicks removed and irrigated this morning;  Open to drain but staples remaim with reported threat of fascial breakdown;  Abdominal binder in place. Wound has no surrounding erythema  Lab Results:  Results for orders placed or performed during the hospital encounter of 02/24/19 (from the past 48 hour(s))  CBC     Status: Abnormal   Collection Time: 02/27/19  3:01 AM  Result Value Ref Range   WBC 10.4 4.0 - 10.5 K/uL   RBC 2.82 (L) 3.87 - 5.11 MIL/uL   Hemoglobin 8.1 (L) 12.0 - 15.0 g/dL   HCT 25.0 (L) 36.0 - 46.0 %   MCV 88.7 80.0 - 100.0 fL   MCH 28.7 26.0 - 34.0 pg   MCHC 32.4 30.0 - 36.0 g/dL   RDW 14.6 11.5 - 15.5 %   Platelets 550 (H) 150 - 400 K/uL   nRBC 0.0 0.0 - 0.2 %    Comment: Performed at [facility], [address] [place], [place] [address]  Basic metabolic panel     Status: Abnormal   Collection Time: 02/27/19  3:01 AM  Result Value Ref Range   Sodium 141 135 - 145 mmol/L   Potassium 3.1 (L) 3.5 - 5.1 mmol/L   Chloride 105 98 - 111 mmol/L   CO2 25 22 - 32 mmol/L   Glucose, Bld 92 70 - 99 mg/dL   BUN <5 (L) 6 - 20 mg/dL   Creatinine, Ser 0.63 0.44 - 1.00 mg/dL   Calcium 8.5 (L) 8.9 -  10.3 mg/dL   GFR calc non Af Amer >60 >60 mL/min   GFR calc Af Amer >60 >60 mL/min   Anion gap 11 5 - 15    Comment: Performed at [facility], [place] [address], [place], [place] [address]  Magnesium     Status: None   Collection Time: 02/27/19  3:01 AM  Result Value Ref Range   Magnesium 1.7 1.7 - 2.4 mg/dL    Comment: Performed at [facility] [address], [place], [place] [address]  CBC     Status: Abnormal   Collection Time: 02/28/19  3:36 AM  Result Value Ref Range   WBC 8.6 4.0 - 10.5 K/uL   RBC 2.72 (L) 3.87 - 5.11 MIL/uL   Hemoglobin 8.0 (L) 12.0 - 15.0 g/dL   HCT 24.7 (L) 36.0 - 46.0 %   MCV 90.8 80.0 - 100.0 fL   MCH 29.4 26.0 - 34.0 pg   MCHC 32.4 30.0 - 36.0 g/dL   RDW 14.7 11.5 - 15.5 %   Platelets 512 (H) 150 - 400 K/uL   nRBC 0.0 0.0 - 0.2 %  Comment: Performed at [facility], [address] [place], [place] [address]  Basic metabolic panel     Status: Abnormal   Collection Time: 02/28/19  3:36 AM  Result Value Ref Range   Sodium 140 135 - 145 mmol/L   Potassium 3.5 3.5 - 5.1 mmol/L   Chloride 104 98 - 111 mmol/L   CO2 25 22 - 32 mmol/L   Glucose, Bld 106 (H) 70 - 99 mg/dL   BUN <5 (L) 6 - 20 mg/dL   Creatinine, Ser 6.040.58 0.44 - 1.00 mg/dL   Calcium 8.4 (L) 8.9 - 10.3 mg/dL   GFR calc non Af Amer >60 >60 mL/min   GFR calc Af Amer >60 >60 mL/min   Anion gap 11 5 - 15    Comment: Performed at [facility], [address] [place], [place] [address]  Magnesium     Status: Abnormal   Collection Time: 02/28/19  3:36 AM  Result Value Ref Range   Magnesium 1.6 (L) 1.7 - 2.4 mg/dL    Comment: Performed at [facility], [address] [place], [place] [address]    Radiology/Results: No results found.  Anti-infectives: Anti-infectives (From admission, onward)   Start     Dose/Rate Route Frequency Ordered Stop   02/27/19 1200  ceFAZolin (ANCEF) IVPB 1 g/50 mL premix     1 g 100 mL/hr over 30 Minutes Intravenous Every 8  hours 02/27/19 1047     02/24/19 0845  cefTRIAXone (ROCEPHIN) 1 g in sodium chloride 0.9 % 100 mL IVPB     1 g 200 mL/hr over 30 Minutes Intravenous Every 24 hours 02/24/19 0830 02/26/19 0855   02/24/19 0845  azithromycin (ZITHROMAX) 500 mg in sodium chloride 0.9 % 250 mL IVPB     500 mg 250 mL/hr over 60 Minutes Intravenous Every 24 hours 02/24/19 0830 02/26/19 1116      Assessment/Plan: Problem List: Patient Active Problem List   Diagnosis Date Noted  . Postoperative nausea and vomiting 02/24/2019  . Obesity 02/15/2019  . Pneumonia 11/19/2016  . Back pain 11/19/2016  . Constipation 04/27/2012  . GASTROESOPHAGEAL REFLUX DISEASE 06/26/2009  . SARCOIDOSIS, PULMONARY 06/23/2009  . PNEUMONIA 06/23/2009  . CHEST PAIN 06/23/2009   Continue abd binder Will let wound drain--on Abx.   Advance to bariatric fulls    LOS: 3 days   Stephanie Couphristopher M. Cliffton AstersWhite, M.D. [facility]  02/28/2019 10:25 AM

## 2019-02-28 NOTE — Progress Notes (Signed)
PROGRESS NOTE    [name]  GGY:[id number] DOB: 1961-03-02 DOA: 02/24/2019 PCP: Sinda Du, MD     Brief Narrative:  [name] is a 58 y.o. female with medical history significant for obesity with gastric sleeve surgery, GERD, obesity, hypertension, and anxiety who presented to the emergency department after discharge just on 7/14 after hospitalization for gastric sleeve surgery with complication of hemoperitoneum secondary to bleeding blood vessel which was ligated and she required 3 units of PRBCs.  She was noted to have stabilization following the procedure, but unfortunately developed worsening shortness of breath and underwent CT scan of her chest that did not demonstrate any pulmonary embolus, however she was noted to have multifocal pneumonia and was started on IV antibiotics (azithromycin and Rocephin).  She was subsequently discharged as she was feeling much better and less short of breath.  She states that she continued to feel short of breath all day after discharge and went to sleep overnight and awoke with sudden nausea and vomiting with some mild hematemesis noted.  She was also noted to have some bleeding from her surgical incision site.  She started to have some shortness of breath, but denies any chest pain, coughing, fevers, or chills. CT of the abdomen and pelvis with no reaccumulation of hematoma or acute pathology noted.  There are findings of nonspecific enteritis, however.  Patient was subsequently transferred from [facility] to [facility], general surgery consulted.  New events last 24 hours / Subjective: Diarrhea better. Still having some abdominal pain   Assessment & Plan:   Active Problems:   Postoperative nausea and vomiting   Postoperative nausea and vomiting in the setting of recent sleeve gastrectomy and open evacuation of hematoma and ligation of vessels 7/8 -Nonspecific enteritis noted on CT scan -C. difficile, GI PCR negative -Per  general surgery -Ancef for wound infection, for 10-day course  Hypokalemia -Replace, trend  Hypomagnesemia -Replace, trend   Recent multifocal pneumonia -Chest x-ray: Suspected subsegmental atelectasis in the right infrahilar lung and left base.  She was diagnosed with pneumonia during previous admission, treated with Rocephin and azithromycin  -Completed Rocephin, azithromycin for CAP for total 5-day course   DVT prophylaxis: Lovenox Code Status: Full Family Communication: None Disposition Plan: Pending further clinical improvement   Consultants:   General surgery  Procedures:   None  Antimicrobials:  Anti-infectives (From admission, onward)   Start     Dose/Rate Route Frequency Ordered Stop   02/27/19 1200  ceFAZolin (ANCEF) IVPB 1 g/50 mL premix     1 g 100 mL/hr over 30 Minutes Intravenous Every 8 hours 02/27/19 1047     02/24/19 0845  cefTRIAXone (ROCEPHIN) 1 g in sodium chloride 0.9 % 100 mL IVPB     1 g 200 mL/hr over 30 Minutes Intravenous Every 24 hours 02/24/19 0830 02/26/19 0855   02/24/19 0845  azithromycin (ZITHROMAX) 500 mg in sodium chloride 0.9 % 250 mL IVPB     500 mg 250 mL/hr over 60 Minutes Intravenous Every 24 hours 02/24/19 0830 02/26/19 1116       Objective: Vitals:   02/27/19 1453 02/27/19 2114 02/28/19 0516 02/28/19 1318  BP: 130/70 123/60 137/61 (!) 148/64  Pulse: 78 73 72 71  Resp: 20 18 18 18   Temp: 98.6 F (37 C) 98.5 F (36.9 C) 98.9 F (37.2 C) 99 F (37.2 C)  TempSrc: Oral Oral Oral Oral  SpO2: 97% 94% 94% 93%  Weight:   110.8 kg   Height:  Intake/Output Summary (Last 24 hours) at 02/28/2019 1338 Last data filed at 02/28/2019 1047 Gross per 24 hour  Intake 600 ml  Output --  Net 600 ml   Filed Weights   02/26/19 0707 02/27/19 0529 02/28/19 0516  Weight: 112.5 kg 110.8 kg 110.8 kg    Examination: General exam: Appears calm and comfortable  Respiratory system: Clear to auscultation. Respiratory effort  normal. Cardiovascular system: S1 & S2 heard, RRR. No JVD, murmurs, rubs, gallops or clicks. No pedal edema. Gastrointestinal system: Abdominal binder in place  Central nervous system: Alert and oriented. No focal neurological deficits. Extremities: Symmetric 5 x 5 power. Skin: No rashes, lesions or ulcers Psychiatry: Judgement and insight appear normal. Mood & affect appropriate.    Data Reviewed: I have personally reviewed following labs and imaging studies  CBC: Recent Labs  Lab 02/24/19 0355 02/25/19 0341 02/26/19 0245 02/27/19 0301 02/28/19 0336  WBC 14.7* 11.3* 10.9* 10.4 8.6  NEUTROABS 12.4*  --   --   --   --   HGB 9.0* 7.7* 7.8* 8.1* 8.0*  HCT 27.8* 24.0* 25.1* 25.0* 24.7*  MCV 89.1 92.0 90.9 88.7 90.8  PLT 409* 425* 500* 550* 512*   Basic Metabolic Panel: Recent Labs  Lab 02/24/19 0355 02/24/19 0615 02/25/19 0341 02/26/19 0245 02/27/19 0301 02/28/19 0336  NA 142  --  144 143 141 140  K 2.5*  --  3.5 3.2* 3.1* 3.5  CL 104  --  109 107 105 104  CO2 26  --  25 23 25 25   GLUCOSE 107*  --  79 69* 92 106*  BUN <5*  --  5* 5* <5* <5*  CREATININE 0.64  --  0.69 0.72 0.63 0.58  CALCIUM 8.2*  --  8.3* 8.3* 8.5* 8.4*  MG  --  1.8 1.6* 1.9 1.7 1.6*   GFR: Estimated Creatinine Clearance: 92.8 mL/min (by C-G formula based on SCr of 0.58 mg/dL). Liver Function Tests: Recent Labs  Lab 02/24/19 0355 02/25/19 0341  AST 22 11*  ALT 45* 29  ALKPHOS 77 60  BILITOT 0.6 0.8  PROT 7.2 6.2*  ALBUMIN 2.5* 1.9*   Recent Labs  Lab 02/24/19 0355  LIPASE 33   No results for input(s): AMMONIA in the last 168 hours. Coagulation Profile: No results for input(s): INR, PROTIME in the last 168 hours. Cardiac Enzymes: No results for input(s): CKTOTAL, CKMB, CKMBINDEX, TROPONINI in the last 168 hours. BNP (last 3 results) No results for input(s): PROBNP in the last 8760 hours. HbA1C: No results for input(s): HGBA1C in the last 72 hours. CBG: No results for input(s):  GLUCAP in the last 168 hours. Lipid Profile: No results for input(s): CHOL, HDL, LDLCALC, TRIG, CHOLHDL, LDLDIRECT in the last 72 hours. Thyroid Function Tests: No results for input(s): TSH, T4TOTAL, FREET4, T3FREE, THYROIDAB in the last 72 hours. Anemia Panel: No results for input(s): VITAMINB12, FOLATE, FERRITIN, TIBC, IRON, RETICCTPCT in the last 72 hours. Sepsis Labs: No results for input(s): PROCALCITON, LATICACIDVEN in the last 168 hours.  Recent Results (from the past 240 hour(s))  SARS Coronavirus 2 (CEPHEID - Performed in [facility] hospital lab), Hosp Order     Status: None   Collection Time: 02/24/19  5:40 AM   Specimen: Nasopharyngeal Swab  Result Value Ref Range Status   SARS Coronavirus 2 NEGATIVE NEGATIVE Final    Comment: (NOTE) If result is NEGATIVE SARS-CoV-2 target nucleic acids are NOT DETECTED. The SARS-CoV-2 RNA is generally detectable in upper and  lower  respiratory specimens during the acute phase of infection. The lowest  concentration of SARS-CoV-2 viral copies this assay can detect is 250  copies / mL. A negative result does not preclude SARS-CoV-2 infection  and should not be used as the sole basis for treatment or other  patient management decisions.  A negative result may occur with  improper specimen collection / handling, submission of specimen other  than nasopharyngeal swab, presence of viral mutation(s) within the  areas targeted by this assay, and inadequate number of viral copies  (<250 copies / mL). A negative result must be combined with clinical  observations, patient history, and epidemiological information. If result is POSITIVE SARS-CoV-2 target nucleic acids are DETECTED. The SARS-CoV-2 RNA is generally detectable in upper and lower  respiratory specimens dur ing the acute phase of infection.  Positive  results are indicative of active infection with SARS-CoV-2.  Clinical  correlation with patient history and other diagnostic  information is  necessary to determine patient infection status.  Positive results do  not rule out bacterial infection or co-infection with other viruses. If result is PRESUMPTIVE POSTIVE SARS-CoV-2 nucleic acids MAY BE PRESENT.   A presumptive positive result was obtained on the submitted specimen  and confirmed on repeat testing.  While 2019 novel coronavirus  (SARS-CoV-2) nucleic acids may be present in the submitted sample  additional confirmatory testing may be necessary for epidemiological  and / or clinical management purposes  to differentiate between  SARS-CoV-2 and other Sarbecovirus currently known to infect humans.  If clinically indicated additional testing with an alternate test  methodology [id number]) is advised. The SARS-CoV-2 RNA is generally  detectable in upper and lower respiratory sp ecimens during the acute  phase of infection. The expected result is Negative. Fact Sheet for Patients:  BoilerBrush.com.cyhttps://www.fda.gov/media/136312/download Fact Sheet for Healthcare Providers: https://pope.com/https://www.fda.gov/media/136313/download This test is not yet approved or cleared by the [place] FDA and has been authorized for detection and/or diagnosis of SARS-CoV-2 by FDA under an Emergency Use Authorization (EUA).  This EUA will remain in effect (meaning this test can be used) for the duration of the COVID-19 declaration under Section 564(b)(1) of the Act, 21 U.S.C. section 360bbb-3(b)(1), unless the authorization is terminated or revoked sooner. Performed at [facility], [address] [place], [place] [address]   C difficile quick scan w PCR reflex     Status: None   Collection Time: 02/25/19 11:40 AM   Specimen: Stool  Result Value Ref Range Status   C Diff antigen NEGATIVE NEGATIVE Final   C Diff toxin NEGATIVE NEGATIVE Final   C Diff interpretation No C. difficile detected.  Final    Comment: Performed at [facility], [address] [place], [place] [address]    Gastrointestinal Panel by PCR , Stool     Status: None   Collection Time: 02/25/19 11:40 AM  Result Value Ref Range Status   Campylobacter species NOT DETECTED NOT DETECTED Final   Plesimonas shigelloides NOT DETECTED NOT DETECTED Final   Salmonella species NOT DETECTED NOT DETECTED Final   Yersinia enterocolitica NOT DETECTED NOT DETECTED Final   Vibrio species NOT DETECTED NOT DETECTED Final   Vibrio cholerae NOT DETECTED NOT DETECTED Final   Enteroaggregative E coli (EAEC) NOT DETECTED NOT DETECTED Final   Enteropathogenic E coli (EPEC) NOT DETECTED NOT DETECTED Final   Enterotoxigenic E coli (ETEC) NOT DETECTED NOT DETECTED Final   Shiga like toxin producing E coli (STEC) NOT DETECTED NOT  DETECTED Final   Shigella/Enteroinvasive E coli (EIEC) NOT DETECTED NOT DETECTED Final   Cryptosporidium NOT DETECTED NOT DETECTED Final   Cyclospora cayetanensis NOT DETECTED NOT DETECTED Final   Entamoeba histolytica NOT DETECTED NOT DETECTED Final   Giardia lamblia NOT DETECTED NOT DETECTED Final   Adenovirus F40/41 NOT DETECTED NOT DETECTED Final   Astrovirus NOT DETECTED NOT DETECTED Final   Norovirus GI/GII NOT DETECTED NOT DETECTED Final   Rotavirus A NOT DETECTED NOT DETECTED Final   Sapovirus (I, II, IV, and V) NOT DETECTED NOT DETECTED Final    Comment: Performed at [facility], [address] [place], [place] [address]      Radiology Studies: No results found.    Scheduled Meds:  amLODipine  5 mg Oral Daily   enoxaparin (LOVENOX) injection  40 mg Subcutaneous Q24H   escitalopram  5 mg Oral Daily   gabapentin  300 mg Oral Daily   Ensure Max Protein  11 oz Oral BID   Continuous Infusions:   ceFAZolin (ANCEF) IV 1 g (02/28/19 0533)     LOS: 3 days     Time spent: 25 minutes   Noralee StainJennifer Avonelle Viveros, DO [facility] [email or web address] 02/28/2019, 1:38 PM

## 2019-03-01 ENCOUNTER — Telehealth (HOSPITAL_COMMUNITY): Payer: Self-pay | Admitting: *Deleted

## 2019-03-01 LAB — BASIC METABOLIC PANEL
Anion gap: 9 (ref 5–15)
BUN: <5 mg/dL — ABNORMAL LOW (ref 6–20)
CO2: 27 mmol/L (ref 22–32)
Calcium: 8.4 mg/dL — ABNORMAL LOW (ref 8.9–10.3)
Chloride: 104 mmol/L (ref 98–111)
Creatinine, Ser: 0.61 mg/dL (ref 0.44–1.00)
GFR calc Af Amer: >60 mL/min (ref >60)
GFR calc non Af Amer: >60 mL/min (ref >60)
Glucose, Bld: 98 mg/dL (ref 70–99)
Potassium: 3.7 mmol/L (ref 3.5–5.1)
Sodium: 140 mmol/L (ref 135–145)

## 2019-03-01 LAB — CBC
HCT: 25.6 % — ABNORMAL LOW (ref 36.0–46.0)
Hemoglobin: 8.1 g/dL — ABNORMAL LOW (ref 12.0–15.0)
MCH: 28.7 pg (ref 26.0–34.0)
MCHC: 31.6 g/dL (ref 30.0–36.0)
MCV: 90.8 fL (ref 80.0–100.0)
Platelets: 568 10*3/uL — ABNORMAL HIGH (ref 150–400)
RBC: 2.82 MIL/uL — ABNORMAL LOW (ref 3.87–5.11)
RDW: 14.6 % (ref 11.5–15.5)
WBC: 8 10*3/uL (ref 4.0–10.5)
nRBC: 0.4 % — ABNORMAL HIGH (ref 0.0–0.2)

## 2019-03-01 LAB — MAGNESIUM: Magnesium: 1.8 mg/dL (ref 1.7–2.4)

## 2019-03-01 MED ORDER — OXYCODONE HCL 5 MG PO TABS
5.0000 mg | ORAL_TABLET | ORAL | Status: DC | PRN
  Administered 2019-03-01 – 2019-03-02 (×4): 5 mg via ORAL
  Filled 2019-03-01 (×4): qty 1

## 2019-03-01 MED ORDER — ENOXAPARIN (LOVENOX) PATIENT EDUCATION KIT
PACK | Freq: Once | Status: AC
  Administered 2019-03-01: 16:00:00
  Filled 2019-03-01: qty 1

## 2019-03-01 MED ORDER — HYDROMORPHONE HCL 1 MG/ML IJ SOLN: 0.5000 mg | INTRAMUSCULAR | Status: DC | PRN

## 2019-03-01 MED ORDER — HYDROMORPHONE HCL 1 MG/ML IJ SOLN
0.5000 mg | INTRAMUSCULAR | Status: DC | PRN
  Administered 2019-03-01 – 2019-03-02 (×3): 0.5 mg via INTRAVENOUS
  Filled 2019-03-01 (×5): qty 0.5

## 2019-03-01 NOTE — Telephone Encounter (Signed)
Patient called to discuss post bariatric surgery follow up questions.  See below:   1.  Tell me about your pain and pain management? Mild to miderate pain. Hospital staff assisting  2.  Let's talk about fluid intake.  How much total fluid are you taking in? Pt is on clear liquids and an inpt on 5W room 9 [facility]  3.  How much protein have you taken in the last 2 days? No order for po protein at this time  4.  Have you had nausea?  Tell me about when have experienced nausea and what you did to help? Minimal nausea  5.  Has the frequency or color changed with your urine? Clear and light  6.  Tell me what your incisions look like? No bleeding and with dressing  7.  Have you been passing gas? BM? Yes and had BM today  8.  If a problem or question were to arise who would you call?  Do you know contact numbers for [facility], [facility], and [facility]? yes  9.  How has the walking going? Walking in room, BR and also in hall occasionally  10.  How are your vitamins and calcium going?  How are you taking them? Not taking presently

## 2019-03-01 NOTE — Care Management Important Message (Signed)
Important Message  Patient Details  Name: [name] MRN: [id number] Date of Birth: 1960-09-27   Medicare Important Message Given:  Yes     [name] 03/01/2019, 2:12 PM

## 2019-03-01 NOTE — Progress Notes (Addendum)
Subjective/Chief Complaint: Eating/drinking well.  Got about 7 cups water yesterday 2 servings of cream of chicken abd binder helps a lot   Objective: Vital signs in last 24 hours: Temp:  [99 F (37.2 C)-99.4 F (37.4 C)] 99.4 F (37.4 C) (07/20 0551) Pulse Rate:  [71-77] 77 (07/20 0551) Resp:  [18] 18 (07/20 0551) BP: (113-148)/(62-73) 113/62 (07/20 0551) SpO2:  [92 %-95 %] 95 % (07/20 0551) Weight:  [108.2 kg] 108.2 kg (07/20 0500) Last BM Date: 02/28/19  Intake/Output from previous day: 07/19 0701 - 07/20 0700 In: 1130 [P.O.:880; IV Piggyback:250] Out: -  Intake/Output this shift: No intake/output data recorded.  Alert, nad,nontoxic Non labored Reg Obese, +binder. Upper midline gauze with seropurulent drainage.  1 skin staple removed. No cellulitis. Can express some purulent drainage. Tracks cephalad and lateral with Q tip  Lab Results:  Recent Labs    02/28/19 0336 03/01/19 0302  WBC 8.6 8.0  HGB 8.0* 8.1*  HCT 24.7* 25.6*  PLT 512* 568*   BMET Recent Labs    02/28/19 0336 03/01/19 0302  NA 140 140  K 3.5 3.7  CL 104 104  CO2 25 27  GLUCOSE 106* 98  BUN <5* <5*  CREATININE 0.58 0.61  CALCIUM 8.4* 8.4*   PT/INR No results for input(s): LABPROT, INR in the last 72 hours. ABG No results for input(s): PHART, HCO3 in the last 72 hours.  Invalid input(s): PCO2, PO2  Studies/Results: No results found.  Anti-infectives: Anti-infectives (From admission, onward)   Start     Dose/Rate Route Frequency Ordered Stop   02/27/19 1200  ceFAZolin (ANCEF) IVPB 1 g/50 mL premix     1 g 100 mL/hr over 30 Minutes Intravenous Every 8 hours 02/27/19 1047     02/24/19 0845  cefTRIAXone (ROCEPHIN) 1 g in sodium chloride 0.9 % 100 mL IVPB     1 g 200 mL/hr over 30 Minutes Intravenous Every 24 hours 02/24/19 0830 02/26/19 0855   02/24/19 0845  azithromycin (ZITHROMAX) 500 mg in sodium chloride 0.9 % 250 mL IVPB     500 mg 250 mL/hr over 60 Minutes  Intravenous Every 24 hours 02/24/19 0830 02/26/19 1116      Assessment/Plan: 58 yo female s/p sleeve gastrectomy, reexploration for bleeding, readmitted for diarrhea, now with concern for wound infection. CT shows fascial edges have dehisced.  No fever. No wbc. No cellulitis. Drainage from upper midline incision.  Explained wound issue to pt and that ideally we would open up incision further to promote healing by 2 intention. However given her fascial edges have dehisced Dr Kieth Brightly would like to keep as much skin intact as possible to prevent complete dehiscence & another return to OR and will ultimately form a hernia.   I discussed that we may ultimately have to remove staples but can keep them in place for now.   I think she is stable for dc tomorrow - just needs wound care and wound surveillance.  She is tolerating a bariatric full liquid diet. Stools are better.   Add PO pain med Will get case manager consult for [facility] for wound care/surveillance Also discussed possibility of sending out pt on once a day lovenox prophylaxis for VTE prevention given her prolonged hospital stay. Pt wants to think about it. Will order lovenox teaching kit just in case.   Allison Ruff. Redmond Pulling, MD, FACS General, Bariatric, & Minimally Invasive Surgery [facility], [place]   LOS: 4 days    [name]  03/01/2019 

## 2019-03-01 NOTE — Progress Notes (Signed)
Dressing change completed at 0000. Moderate drainage on dressing; serosangineous and purulent. 3 4x4 gauze pads saturated with serosangineous and purulent drainage. Oozing of primarily serosangineous drainage at 2nd and 3rd staple; minimal oozing. Oozing of serosangineous and purulent drainage between 6th and 7th staple.   Wound cleansed, new dressing applied and abdominal binder reapplied.

## 2019-03-01 NOTE — Progress Notes (Signed)
PROGRESS NOTE    [name]  NWG:[id number] DOB: 03/14/1961 DOA: 02/24/2019 PCP: Kari BaarsHawkins, Edward, MD     Brief Narrative:  Allison RaringBarbara Chase is a 58 y.o. female with medical history significant for obesity with gastric sleeve surgery, GERD, obesity, hypertension, and anxiety who presented to the emergency department after discharge just on 7/14 after hospitalization for gastric sleeve surgery with complication of hemoperitoneum secondary to bleeding blood vessel which was ligated and she required 3 units of PRBCs.  She was noted to have stabilization following the procedure, but unfortunately developed worsening shortness of breath and underwent CT scan of her chest that did not demonstrate any pulmonary embolus, however she was noted to have multifocal pneumonia and was started on IV antibiotics (azithromycin and Rocephin).  She was subsequently discharged as she was feeling much better and less short of breath.  She states that she continued to feel short of breath all day after discharge and went to sleep overnight and awoke with sudden nausea and vomiting with some mild hematemesis noted.  She was also noted to have some bleeding from her surgical incision site.  She started to have some shortness of breath, but denies any chest pain, coughing, fevers, or chills. CT of the abdomen and pelvis with no reaccumulation of hematoma or acute pathology noted.  There are findings of nonspecific enteritis, however.  [facility], general surgery consulted.  New events last 24 hours / Subjective: No new issues tolerating bariatric full liquid diet  Assessment & Plan:   Active Problems:   Postoperative nausea and vomiting   Postoperative nausea and vomiting in the setting of recent sleeve gastrectomy and open evacuation of hematoma and ligation of vessels 7/8 -Nonspecific enteritis noted on CT scan -C. difficile, GI PCR negative -Per  general surgery -Ancef for wound infection, for 10-day course  Recent multifocal pneumonia -Chest x-ray: Suspected subsegmental atelectasis in the right infrahilar lung and left base.  She was diagnosed with pneumonia during previous admission, treated with Rocephin and azithromycin  -Completed Rocephin, azithromycin for CAP for total 5-day course  Depression -Continue Lexapro, Xanax as needed  Hypertension -Continue Norvasc  DVT prophylaxis: Lovenox Code Status: Full Family Communication: None Disposition Plan: Pending further clinical improvement, per general surgery for discharge   Consultants:   General surgery  Procedures:   None  Antimicrobials:  Anti-infectives (From admission, onward)   Start     Dose/Rate Route Frequency Ordered Stop   02/27/19 1200  ceFAZolin (ANCEF) IVPB 1 g/50 mL premix     1 g 100 mL/hr over 30 Minutes Intravenous Every 8 hours 02/27/19 1047     02/24/19 0845  cefTRIAXone (ROCEPHIN) 1 g in sodium chloride 0.9 % 100 mL IVPB     1 g 200 mL/hr over 30 Minutes Intravenous Every 24 hours 02/24/19 0830 02/26/19 0855   02/24/19 0845  azithromycin (ZITHROMAX) 500 mg in sodium chloride 0.9 % 250 mL IVPB     500 mg 250 mL/hr over 60 Minutes Intravenous Every 24 hours 02/24/19 0830 02/26/19 1116       Objective: Vitals:   02/28/19 1318 02/28/19 2137 03/01/19 0500 03/01/19 0551  BP: (!) 148/64 (!) 145/73  113/62  Pulse: 71 75  77  Resp: 18 18  18   Temp: 99 F (37.2 C) 99.1 F (37.3 C)  99.4 F (37.4 C)  TempSrc: Oral     SpO2: 93% 92%  95%  Weight:   108.2  kg   Height:        Intake/Output Summary (Last 24 hours) at 03/01/2019 1049 Last data filed at 02/28/2019 1900 Gross per 24 hour  Intake 930 ml  Output --  Net 930 ml   Filed Weights   02/27/19 0529 02/28/19 0516 03/01/19 0500  Weight: 110.8 kg 110.8 kg 108.2 kg    Examination: General exam: Appears calm and comfortable  Respiratory system: Clear to auscultation.  Respiratory effort normal. Cardiovascular system: S1 & S2 heard, RRR. No JVD, murmurs, rubs, gallops or clicks. No pedal edema. Gastrointestinal system: Abdominal binder in place Central nervous system: Alert and oriented. No focal neurological deficits. Extremities: Symmetric 5 x 5 power. Skin: No rashes, lesions or ulcers Psychiatry: Judgement and insight appear normal. Mood & affect appropriate.   Data Reviewed: I have personally reviewed following labs and imaging studies  CBC: Recent Labs  Lab 02/24/19 0355 02/25/19 0341 02/26/19 0245 02/27/19 0301 02/28/19 0336 03/01/19 0302  WBC 14.7* 11.3* 10.9* 10.4 8.6 8.0  NEUTROABS 12.4*  --   --   --   --   --   HGB 9.0* 7.7* 7.8* 8.1* 8.0* 8.1*  HCT 27.8* 24.0* 25.1* 25.0* 24.7* 25.6*  MCV 89.1 92.0 90.9 88.7 90.8 90.8  PLT 409* 425* 500* 550* 512* 568*   Basic Metabolic Panel: Recent Labs  Lab 02/25/19 0341 02/26/19 0245 02/27/19 0301 02/28/19 0336 03/01/19 0302  NA 144 143 141 140 140  K 3.5 3.2* 3.1* 3.5 3.7  CL 109 107 105 104 104  CO2 25 23 25 25 27   GLUCOSE 79 69* 92 106* 98  BUN 5* 5* <5* <5* <5*  CREATININE 0.69 0.72 0.63 0.58 0.61  CALCIUM 8.3* 8.3* 8.5* 8.4* 8.4*  MG 1.6* 1.9 1.7 1.6* 1.8   GFR: Estimated Creatinine Clearance: 91.5 mL/min (by C-G formula based on SCr of 0.61 mg/dL). Liver Function Tests: Recent Labs  Lab 02/24/19 0355 02/25/19 0341  AST 22 11*  ALT 45* 29  ALKPHOS 77 60  BILITOT 0.6 0.8  PROT 7.2 6.2*  ALBUMIN 2.5* 1.9*   Recent Labs  Lab 02/24/19 0355  LIPASE 33   No results for input(s): AMMONIA in the last 168 hours. Coagulation Profile: No results for input(s): INR, PROTIME in the last 168 hours. Cardiac Enzymes: No results for input(s): CKTOTAL, CKMB, CKMBINDEX, TROPONINI in the last 168 hours. BNP (last 3 results) No results for input(s): PROBNP in the last 8760 hours. HbA1C: No results for input(s): HGBA1C in the last 72 hours. CBG: No results for input(s): GLUCAP  in the last 168 hours. Lipid Profile: No results for input(s): CHOL, HDL, LDLCALC, TRIG, CHOLHDL, LDLDIRECT in the last 72 hours. Thyroid Function Tests: No results for input(s): TSH, T4TOTAL, FREET4, T3FREE, THYROIDAB in the last 72 hours. Anemia Panel: No results for input(s): VITAMINB12, FOLATE, FERRITIN, TIBC, IRON, RETICCTPCT in the last 72 hours. Sepsis Labs: No results for input(s): PROCALCITON, LATICACIDVEN in the last 168 hours.  Recent Results (from the past 240 hour(s))  SARS Coronavirus 2 (CEPHEID - Performed in [facility] hospital lab), Hosp Order     Status: None   Collection Time: 02/24/19  5:40 AM   Specimen: Nasopharyngeal Swab  Result Value Ref Range Status   SARS Coronavirus 2 NEGATIVE NEGATIVE Final    Comment: (NOTE) If result is NEGATIVE SARS-CoV-2 target nucleic acids are NOT DETECTED. The SARS-CoV-2 RNA is generally detectable in upper and lower  respiratory specimens during the acute phase of infection.  The lowest  concentration of SARS-CoV-2 viral copies this assay can detect is 250  copies / mL. A negative result does not preclude SARS-CoV-2 infection  and should not be used as the sole basis for treatment or other  patient management decisions.  A negative result may occur with  improper specimen collection / handling, submission of specimen other  than nasopharyngeal swab, presence of viral mutation(s) within the  areas targeted by this assay, and inadequate number of viral copies  (<250 copies / mL). A negative result must be combined with clinical  observations, patient history, and epidemiological information. If result is POSITIVE SARS-CoV-2 target nucleic acids are DETECTED. The SARS-CoV-2 RNA is generally detectable in upper and lower  respiratory specimens dur ing the acute phase of infection.  Positive  results are indicative of active infection with SARS-CoV-2.  Clinical  correlation with patient history and other diagnostic information is    necessary to determine patient infection status.  Positive results do  not rule out bacterial infection or co-infection with other viruses. If result is PRESUMPTIVE POSTIVE SARS-CoV-2 nucleic acids MAY BE PRESENT.   A presumptive positive result was obtained on the submitted specimen  and confirmed on repeat testing.  While 2019 novel coronavirus  (SARS-CoV-2) nucleic acids may be present in the submitted sample  additional confirmatory testing may be necessary for epidemiological  and / or clinical management purposes  to differentiate between  SARS-CoV-2 and other Sarbecovirus currently known to infect humans.  If clinically indicated additional testing with an alternate test  methodology [phone]) is advised. The SARS-CoV-2 RNA is generally  detectable in upper and lower respiratory sp ecimens during the acute  phase of infection. The expected result is Negative. Fact Sheet for Patients:  BoilerBrush.com.cyhttps://www.fda.gov/media/136312/download Fact Sheet for Healthcare Providers: https://pope.com/https://www.fda.gov/media/136313/download This test is not yet approved or cleared by the [place] FDA and has been authorized for detection and/or diagnosis of SARS-CoV-2 by FDA under an Emergency Use Authorization (EUA).  This EUA will remain in effect (meaning this test can be used) for the duration of the COVID-19 declaration under Section 564(b)(1) of the Act, 21 U.S.C. section 360bbb-3(b)(1), unless the authorization is terminated or revoked sooner. Performed at [facility], [address] [place], [place] [address]   C difficile quick scan w PCR reflex     Status: None   Collection Time: 02/25/19 11:40 AM   Specimen: Stool  Result Value Ref Range Status   C Diff antigen NEGATIVE NEGATIVE Final   C Diff toxin NEGATIVE NEGATIVE Final   C Diff interpretation No C. difficile detected.  Final    Comment: Performed at [facility], [address] [place], [place] [address]  Gastrointestinal Panel by  PCR , Stool     Status: None   Collection Time: 02/25/19 11:40 AM  Result Value Ref Range Status   Campylobacter species NOT DETECTED NOT DETECTED Final   Plesimonas shigelloides NOT DETECTED NOT DETECTED Final   Salmonella species NOT DETECTED NOT DETECTED Final   Yersinia enterocolitica NOT DETECTED NOT DETECTED Final   Vibrio species NOT DETECTED NOT DETECTED Final   Vibrio cholerae NOT DETECTED NOT DETECTED Final   Enteroaggregative E coli (EAEC) NOT DETECTED NOT DETECTED Final   Enteropathogenic E coli (EPEC) NOT DETECTED NOT DETECTED Final   Enterotoxigenic E coli (ETEC) NOT DETECTED NOT DETECTED Final   Shiga like toxin producing E coli (STEC) NOT DETECTED NOT DETECTED Final   Shigella/Enteroinvasive E coli (EIEC) NOT DETECTED  NOT DETECTED Final   Cryptosporidium NOT DETECTED NOT DETECTED Final   Cyclospora cayetanensis NOT DETECTED NOT DETECTED Final   Entamoeba histolytica NOT DETECTED NOT DETECTED Final   Giardia lamblia NOT DETECTED NOT DETECTED Final   Adenovirus F40/41 NOT DETECTED NOT DETECTED Final   Astrovirus NOT DETECTED NOT DETECTED Final   Norovirus GI/GII NOT DETECTED NOT DETECTED Final   Rotavirus A NOT DETECTED NOT DETECTED Final   Sapovirus (I, II, IV, and V) NOT DETECTED NOT DETECTED Final    Comment: Performed at [facility], [address], [place], [place] [address]      Radiology Studies: No results found.    Scheduled Meds:  amLODipine  5 mg Oral Daily   enoxaparin (LOVENOX) injection  40 mg Subcutaneous Q24H   escitalopram  5 mg Oral Daily   gabapentin  300 mg Oral Daily   Ensure Max Protein  11 oz Oral BID   Continuous Infusions:   ceFAZolin (ANCEF) IV 1 g (03/01/19 0501)     LOS: 4 days     Time spent: 20 minutes   Dessa Phi, DO [facility] [email or web address] 03/01/2019, 10:49 AM

## 2019-03-02 ENCOUNTER — Ambulatory Visit: Payer: Medicare Other | Admitting: Skilled Nursing Facility1

## 2019-03-02 LAB — BASIC METABOLIC PANEL
Anion gap: 8 (ref 5–15)
BUN: <5 mg/dL — ABNORMAL LOW (ref 6–20)
CO2: 29 mmol/L (ref 22–32)
Calcium: 8.6 mg/dL — ABNORMAL LOW (ref 8.9–10.3)
Chloride: 103 mmol/L (ref 98–111)
Creatinine, Ser: 0.69 mg/dL (ref 0.44–1.00)
GFR calc Af Amer: >60 mL/min (ref >60)
GFR calc non Af Amer: >60 mL/min (ref >60)
Glucose, Bld: 91 mg/dL (ref 70–99)
Potassium: 3.8 mmol/L (ref 3.5–5.1)
Sodium: 140 mmol/L (ref 135–145)

## 2019-03-02 LAB — CBC
HCT: 26.4 % — ABNORMAL LOW (ref 36.0–46.0)
Hemoglobin: 8.3 g/dL — ABNORMAL LOW (ref 12.0–15.0)
MCH: 28.7 pg (ref 26.0–34.0)
MCHC: 31.4 g/dL (ref 30.0–36.0)
MCV: 91.3 fL (ref 80.0–100.0)
Platelets: 541 10*3/uL — ABNORMAL HIGH (ref 150–400)
RBC: 2.89 MIL/uL — ABNORMAL LOW (ref 3.87–5.11)
RDW: 14.6 % (ref 11.5–15.5)
WBC: 7.6 10*3/uL (ref 4.0–10.5)
nRBC: 0 % (ref 0.0–0.2)

## 2019-03-02 LAB — NOVEL CORONAVIRUS, NAA: SARS-CoV-2, NAA: NEGATIVE

## 2019-03-02 LAB — MAGNESIUM: Magnesium: 1.9 mg/dL (ref 1.7–2.4)

## 2019-03-02 MED ORDER — CEPHALEXIN 500 MG PO CAPS: 500.0000 mg | ORAL_CAPSULE | Freq: Four times a day (QID) | ORAL | 0 refills | Status: AC

## 2019-03-02 MED ORDER — ENOXAPARIN SODIUM 40 MG/0.4ML ~~LOC~~ SOLN: 40.0000 mg | SUBCUTANEOUS | 0 refills | Status: DC

## 2019-03-02 MED ORDER — OXYCODONE HCL 5 MG PO TABS: 5.0000 mg | ORAL_TABLET | ORAL | 0 refills | Status: AC | PRN

## 2019-03-02 MED FILL — CEPHALEXIN 500 MG CAPSULE: 500 | 3 days supply | Qty: 12 | Fill #0

## 2019-03-02 MED FILL — oxyCODONE HCL 5 MG TABS: 5 | 7 days supply | Qty: 42 | Fill #0

## 2019-03-02 NOTE — Consult Note (Signed)
   [facility] Inpatient Consult   03/02/2019  [name] 1960/09/08 [id number]    Patient was reviewed for potential [facility] Care Management serviceswith her Medicare/NextGenplan; having readmissions within 7 days and 30 days.Patient has 15% medium risk score for unplanned readmissions.  Per chart review and MD's brief narrative on 03/01/19 reveal as follows: Allison Lindseyis a 58 y.o.femalewith medical history significant forobesity with gastric sleeve surgery, GERD, obesity, hypertension, and anxiety,   who presented to the emergency department after discharge just on 7/14- after hospitalization for gastric sleeve surgery with complication of hemoperitoneum secondary to bleeding blood vessel which was ligated and she required 3 units of PRBCs. Patient was status post sleeve gastrectomy, reexploration for bleeding-  She had sudden nausea and vomiting with some mild hematemesis noted and was also noted to have some bleeding from her surgical incision site. Patient was subsequently transferred from [facility] to [facility], general surgery consulted;  readmitted for diarrhea, now with concern for wound infection. CT shows fascial edges have dehisced.  Called patient in the room and spoke with her over the hospital room phone. HIPAA verification obtained. Patient reports that home health RN in place to follow her at home (for wound care/ surveillance). She indicated not having current needs or barriers with medications/ pharmacy and transportation for now. She reports being independent with ADL's prior to admission, however, her children lives with her who are able to support and assist her in case needed. She endorses Dr. Sinda Du as her primary care provider (with Jasper Loser. [place] office in [place], listed as providing transition of care).  Patient agreed she could benefit from [place] calls to follow-up with continued recovery when discharge to home.   Transition of care CM note states that expected discharge plan is Home with [facility]).  Of note, [facility] Care Management services does not replace or interfere with any services that are arranged by transition of care case management or social work.    For questions and additional information, please contact:  Kue Fox A. Cherese Lozano, BSN, RN-BC [facility] Liaison Cell: [phone]

## 2019-03-02 NOTE — Progress Notes (Signed)
Just changed abdominal Dressing at 5:45am.  Good amount of purulent drainage.  Pt. Tolerated well.

## 2019-03-02 NOTE — TOC Initial Note (Addendum)
Transition of Care [facility]) - Initial/Assessment Note    Patient Details  Name: [name] MRN: [id number] Date of Birth: 11-Feb-1961  Transition of Care [facility]) CM/SW Contact:    Sharin Mons, RN Phone Number: 03/02/2019, 10:26 AM  Clinical Narrative:   Admitted with postoperative nausea and vomiting. Hx of obesity with gastric sleeve surgery 7/8, recent multifocal pneumonia , GERD, hypertension, and anxiety. States PTA independent with ADL's, no DME usage.           NCM received consult: Home health nursing for wound care/surveillance. Referral made with [facility] for [facility] services. Physical address: [address], [place] [address]  Pt states has transportation to home when d/c. Voiced no problems affording Rx meds.  Expected Discharge Plan: [place] Barriers to Discharge: Continued Medical Work up   Patient Goals and CMS Choice Patient states their goals for this hospitalization and ongoing recovery are:: To get out of here and do better CMS Medicare.gov Compare Post Acute Care list provided to:: Patient Choice offered to / list presented to : Patient  Expected Discharge Plan and Services Expected Discharge Plan: [place] In-house Referral: NA     Living arrangements for the past 2 months: Single Family Home                           HH Arranged: RN [facility] Agency: [place] ([place]) Date [place]: 03/02/19 Time [place]: 1024 Representative spoke with at [place]: [place] Arrangements/Services Living arrangements for the past 2 months: [place] Lives with:: Adult Children Patient language and need for interpreter reviewed:: Yes Do you feel safe going back to the place where you live?: Yes      Need for Family Participation in Patient Care: Yes (Comment) Care giver support system in place?: Yes (comment)   Criminal Activity/Legal Involvement Pertinent to Current  Situation/Hospitalization: No - Comment as needed  Activities of Daily Living Home Assistive Devices/Equipment: None ADL Screening (condition at time of admission) Patient's cognitive ability adequate to safely complete daily activities?: Yes Is the patient deaf or have difficulty hearing?: No Does the patient have difficulty seeing, even when wearing glasses/contacts?: No Does the patient have difficulty concentrating, remembering, or making decisions?: No Patient able to express need for assistance with ADLs?: Yes Does the patient have difficulty dressing or bathing?: No Independently performs ADLs?: Yes (appropriate for developmental age) Does the patient have difficulty walking or climbing stairs?: No Weakness of Legs: None Weakness of Arms/Hands: None  Permission Sought/Granted Permission sought to share information with : Family Supports Permission granted to share information with : Yes, Verbal Permission Granted  Share Information with NAME: Ladeja Pelham (Daughter)     Permission granted to share info w Relationship: daughter  Permission granted to share info w Contact Information: [phone]  Emotional Assessment Appearance:: Appears stated age Attitude/Demeanor/Rapport: Engaged Affect (typically observed): Accepting Orientation: : Oriented to Self, Oriented to Place, Oriented to  Time, Oriented to Situation Alcohol / Substance Use: Not Applicable Psych Involvement: No (comment)  Admission diagnosis:  Hypokalemia [E87.6] Generalized abdominal pain [R10.84] Postoperative nausea and vomiting [R11.2, Z98.890] Patient Active Problem List   Diagnosis Date Noted  . Postoperative nausea and vomiting 02/24/2019  . Obesity 02/15/2019  . Pneumonia 11/19/2016  . Back pain 11/19/2016  . Constipation 04/27/2012  . GASTROESOPHAGEAL REFLUX DISEASE 06/26/2009  . SARCOIDOSIS, PULMONARY 06/23/2009  . PNEUMONIA  06/23/2009  . CHEST PAIN 06/23/2009   PCP:  Kari BaarsHawkins, Edward,  MD Pharmacy:   [facility] [place], [place] - [address] [place] [address] Phone: [phone] Fax: [phone]  [facility] [place] - [address] [place] [address] Phone: [phone] Fax: [phone]     Social Determinants of Health (SDOH) Interventions    Readmission Risk Interventions No flowsheet data found.

## 2019-03-02 NOTE — Progress Notes (Signed)
Pt seen by MD, orders written for discharge. RN went over discharge instructions with pt (esp wound care and lovenox injections), and answered all questions. RN removed IV's. Pt received keflex and oxycodone prescription from [place] prior to discharge. Pt states she has 13 lovenox injections at home. Pt took lovenox started kit (including a sharps box) with her on this discharge. Pt escorted for discharge via wheelchair with all belongings. Pt will follow up outpatient with MD.

## 2019-03-02 NOTE — Discharge Summary (Signed)
Physician Discharge Summary  Allison RaringBarbara Chase WUJ:[id number] DOB: April 01, 1961 DOA: 02/24/2019  PCP: Kari BaarsHawkins, Edward, MD  Admit date: 02/24/2019 Discharge date: 03/02/2019  Admitted From: Home Disposition:  Home  Recommendations for Outpatient Follow-up:  1. Follow up with General Surgery Dr. Sheliah HatchKinsinger  2. Follow-up for incidental finding of pulmonary nodule in the left apex. Non-contrast chest CT at 6-12 months is recommended.  Home Health: RN   Discharge Condition: Stable CODE STATUS: Full  Diet recommendation: Bariatric full liquid, advance per general surgery  Brief/Interim Summary: Allison MccreedyBarbara Lindseyis a 58 y.o.femalewith medical history significant forobesity with gastric sleeve surgery, GERD, obesity, hypertension, and anxiety who presented to the emergency department after discharge just on 7/14 after hospitalization for gastric sleeve surgery with complication of hemoperitoneum secondary to bleeding blood vessel which was ligated and she required 3 units of PRBCs. She was noted to have stabilization following the procedure, but unfortunately developed worsening shortness of breath and underwent CT scan of her chest that did not demonstrate any pulmonary embolus, however she was noted to have multifocal pneumonia and was started on IV antibiotics (azithromycin and Rocephin). She was subsequently discharged as she was feeling much better and less short of breath. She states that she continued to feel short of breath all day after discharge and went to sleep overnight and awoke with sudden nausea and vomiting with some mild hematemesis noted. She was also noted to have some bleeding from her surgical incision site. She started to have some shortness of breath, but denies any chest pain, coughing, fevers, or chills.CT of the abdomen and pelvis with no reaccumulation of hematoma or acute pathology noted. There are findings of nonspecific enteritis, however.  Patient was subsequently  [facility], general surgery consulted.  Discharge Diagnoses:   Postoperative nausea and vomiting in the setting of recent sleeve gastrectomy and open evacuation of hematoma and ligation of vessels 7/8 -Nonspecific enteritis noted on CT scan -C. difficile, GI PCR negative -Per general surgery -Ancef --> Keflex for wound infection, for total 10-day course. Lovenox prescribed for DVT ppx   Recent multifocal pneumonia -Chest x-ray: Suspected subsegmental atelectasis in the right infrahilar lung and left base.  She was diagnosed with pneumonia during previous admission, treated with Rocephin and azithromycin  -Completed Rocephin, azithromycin for CAP for total 5-day course  Depression -Continue Lexapro, Xanax as needed  Hypertension -Continue Norvasc   Discharge Instructions  Discharge Instructions    Call MD for:  difficulty breathing, headache or visual disturbances   Complete by: As directed    Call MD for:  extreme fatigue   Complete by: As directed    Call MD for:  hives   Complete by: As directed    Call MD for:  persistant dizziness or light-headedness   Complete by: As directed    Call MD for:  persistant nausea and vomiting   Complete by: As directed    Call MD for:  redness, tenderness, or signs of infection (pain, swelling, redness, odor or green/yellow discharge around incision site)   Complete by: As directed    Call MD for:  severe uncontrolled pain   Complete by: As directed    Call MD for:  temperature >100.4   Complete by: As directed    Discharge instructions   Complete by: As directed    You were cared for by a hospitalist during your hospital stay. If you have any questions about your discharge medications or the care you received  while you were in the hospital after you are discharged, you can call the unit and ask to speak with the hospitalist on call if the hospitalist that took care of you is not available. Once  you are discharged, your primary care physician will handle any further medical issues. Please note that NO REFILLS for any discharge medications will be authorized once you are discharged, as it is imperative that you return to your primary care physician (or establish a relationship with a primary care physician if you do not have one) for your aftercare needs so that they can reassess your need for medications and monitor your lab values.   Increase activity slowly   Complete by: As directed      Allergies as of 03/02/2019      Reactions   Aspirin Hives      Medication List    STOP taking these medications   acetaminophen 500 MG tablet Commonly known as: TYLENOL     TAKE these medications   ALPRAZolam 0.5 MG tablet Commonly known as: XANAX Take 0.5 mg by mouth 2 (two) times daily as needed for anxiety or sleep.   amLODipine 5 MG tablet Commonly known as: NORVASC Take 5 mg by mouth daily.   calcium carbonate 500 MG chewable tablet Commonly known as: TUMS - dosed in mg elemental calcium Chew 1 tablet by mouth daily as needed for indigestion or heartburn.   cephALEXin 500 MG capsule Commonly known as: KEFLEX Take 1 capsule (500 mg total) by mouth 4 (four) times daily for 3 days.   cyclobenzaprine 10 MG tablet Commonly known as: FLEXERIL Take 10 mg by mouth 2 (two) times daily as needed for muscle spasms.   enoxaparin 40 MG/0.4ML injection Commonly known as: LOVENOX Inject 0.4 mLs (40 mg total) into the skin daily. Start taking on: March 03, 2019   escitalopram 5 MG tablet Commonly known as: LEXAPRO Take 5 mg by mouth daily.   gabapentin 300 MG capsule Commonly known as: NEURONTIN Take 300 mg by mouth daily.   Linzess 145 MCG Caps capsule Generic drug: linaclotide Take 145 mcg by mouth daily.   oxyCODONE 5 MG immediate release tablet Commonly known as: Oxy IR/ROXICODONE Take 1 tablet (5 mg total) by mouth every 4 (four) hours as needed for up to 7 days for  moderate pain.   WOMENS 50+ MULTI VITAMIN/MIN PO Take 1 tablet by mouth daily.      Follow-up Information    [facility] Follow up.   Why: home health services arranged, [phone]       Kinsinger, Arta Bruce, MD Follow up.   Specialty: General Surgery Contact information: [address] [place] [address] [phone]          Allergies  Allergen Reactions  . Aspirin Hives    Consultations:  General surgery   Procedures/Studies: Ct Angio Chest Pe W Or Wo Contrast  Result Date: 02/23/2019 CLINICAL DATA:  Hypoxemia. Respiratory failure. Recent lap band procedure. Evaluate for pulmonary emboli. EXAM: CT ANGIOGRAPHY CHEST WITH CONTRAST TECHNIQUE: Multidetector CT imaging of the chest was performed using the standard protocol during bolus administration of intravenous contrast. Multiplanar CT image reconstructions and MIPs were obtained to evaluate the vascular anatomy. CONTRAST:  169mL OMNIPAQUE IOHEXOL 350 MG/ML SOLN COMPARISON:  Chest are by 13 2020 FINDINGS: Cardiovascular: The thoracic aorta demonstrates minimal atherosclerotic change. No aneurysm or dissection identified. Cardiomegaly is noted. Respiratory motion limits evaluation of pulmonary arteries. No definite pulmonary  emboli identified within these limitations. Mediastinum/Nodes: The thyroid and esophagus are normal. There is a tiny left pleural effusion. No pericardial effusion or right pleural effusion. No adenopathy. Lungs/Pleura: Central airways are normal. No pneumothorax. Opacities in the lungs are most prominent in the right middle lobe and bilateral lower lobes. More mild opacities are seen in the upper lobes towards the apices. There is a nodule in the left apex measuring up to 8 by 8 by 5 mm with a mean diameter 7 mm. The scattered opacities limit evaluation for other nodules. No other definitive nodules are identified. No other infiltrates or masses. Upper Abdomen: Postoperative  changes are seen in the upper abdomen. There is fluid in the abdominal wall deep to the skin staples. A few foci of air are seen in the fluid as well. No other acute abnormalities. Musculoskeletal: No chest wall abnormality. No acute or significant osseous findings. Review of the MIP images confirms the above findings. IMPRESSION: 1. Evaluation for pulmonary emboli is limited due to respiratory motion. No central emboli identified. 2. Bilateral pulmonary opacities most prominent the right middle lobe and bases. These findings could represent significant atelectasis versus multifocal pneumonia. Recommend clinical correlation. 3. 7 mm nodule in the left apex. Whether this is a true nodule or part of the streaky opacities in the apices is unclear. Non-contrast chest CT at 6-12 months is recommended. If the nodule is stable at time of repeat CT, then future CT at 18-24 months (from today's scan) is considered optional for low-risk patients, but is recommended for high-risk patients. This recommendation follows the consensus statement: Guidelines for Management of Incidental Pulmonary Nodules Detected on CT Images: From the Fleischner Society 2017; Radiology 2017; 284:228-243. 4. Postoperative changes are seen in the upper abdomen with fluid in the anterior abdominal wall deep to the skin staples. A few foci of air seen in the fluid which are nonspecific but could be postoperative. Recommend attention on follow-up. 5. Cardiomegaly. 6. Mild atherosclerotic changes in the thoracic aorta. 7. Tiny left pleural effusion. Aortic Atherosclerosis (ICD10-I70.0). Electronically Signed   By: Gerome Samavid  Williams III M.D   On: 02/23/2019 11:39   Ct Abdomen Pelvis W Contrast  Result Date: 02/24/2019 CLINICAL DATA:  Emesis and abdominal pain. Recent gastric bypass surgery EXAM: CT ABDOMEN AND PELVIS WITH CONTRAST TECHNIQUE: Multidetector CT imaging of the abdomen and pelvis was performed using the standard protocol following bolus  administration of intravenous contrast. CONTRAST:  100mL OMNIPAQUE IOHEXOL 300 MG/ML  SOLN COMPARISON:  02/17/2019 FINDINGS: Lower chest:  Dependent atelectasis.  Borderline heart size Hepatobiliary: No focal liver abnormality.Cholecystectomy. No bile duct dilatation Pancreas: Stranding adjacent to the pancreas is attributed to surgery. The pancreas itself does not appear expanded or edematous. Spleen: Unremarkable. Adrenals/Urinary Tract: Negative adrenals. No hydronephrosis or stone. Unremarkable bladder. Stomach/Bowel: Postoperative stomach. Small volume residual hematoma along the staple line. Thickened bowel loops diffusely with mild submucosal low-density edema. Increased fluid content within the colon reaching the rectum. No pericecal inflammation. Vascular/Lymphatic: Moderate atherosclerotic calcification. Major vessels are patent. No mass or adenopathy. Reproductive:Hysterectomy. Other: No ascites or pneumoperitoneum.  Diastasis rectus. Musculoskeletal: No acute abnormalities. IMPRESSION: 1. Nonspecific enteritis with increased colonic fluid contents suggesting associated diarrhea. 2. Largely resolved hematoma about the operative site with no active hemorrhage. Electronically Signed   By: Marnee SpringJonathon  Watts M.D.   On: 02/24/2019 05:58   Ct Abdomen Pelvis W Contrast  Result Date: 02/17/2019 CLINICAL DATA:  Patient post laparoscopic gastric sleeve resection and upper endoscopy, drop in  hemoglobin, hypertension and anemia. Evaluate for bleed EXAM: CT ABDOMEN AND PELVIS WITH CONTRAST TECHNIQUE: Multidetector CT imaging of the abdomen and pelvis was performed using the standard protocol following bolus administration of intravenous contrast. CONTRAST:  OMNIPAQUE IOHEXOL 300 MG/ML  SOLN COMPARISON:  CT renal colic 11/19/2016 FINDINGS: Lower chest: Extensive areas of basilar atelectasis. Coronary artery calcifications. Few calcifications upon the aortic leaflets. Hepatobiliary: No focal liver abnormality.  Post cholecystectomy clips in gallbladder fossa. Prominence of the extrahepatic biliary tree likely related to reservoir effect. No calcified intraductal gallstones. Pancreas: Unremarkable. No pancreatic ductal dilatation. Small amount of fluid along the pancreas likely related to a combination of postsurgical state and possible surgical leak at the stomach suture line detailed below. Spleen: No splenic injury or perisplenic hematoma. Adrenals/Urinary Tract: Adrenal glands are unremarkable. Kidneys are normal, without renal calculi, focal lesion, or hydronephrosis. Bladder is unremarkable. Stomach/Bowel: Administered enteric contrast medium appears to have largely traversed to the distal small bowel and cecum. There are postsurgical changes from gastric sleeve resection with surgical material along the greater curvature of the stomach. There is a large heterogeneous collection along the suture line with extravasation of high attenuation material seen within the gastric lumen extending into this collection (coronal series 4 images 58, 59). Given the attenuation of this material, most likely could reflect the enteric contrast medium versus hemorrhage. Overall size of the collection is difficult to accurately ascertained the measurements approximate 13.3 x 6.0 x 9.5 cm. Additional low to intermediate attenuation intraperitoneal and retroperitoneal fluid is seen throughout the abdomen. With higher attenuation material layering dependently and within in the deep pelvis. The distal small bowel and colon are unremarkable. Vascular/Lymphatic: Aortic atherosclerosis. No enlarged abdominal or pelvic lymph nodes. Reproductive: Status post hysterectomy. No adnexal masses. Other: Mixed attenuation intraperitoneal retroperitoneal free fluid, detailed above. No bowel containing hernia. Small fat containing umbilical hernia. Musculoskeletal: Asymmetric sclerotic appearance at the left ilium and femoral head are similar to prior  studies. No new osseous lesions. IMPRESSION: Findings concerning for leak along the greater curvature of the stomach. High attenuation blush of contrast extending from the gastric lumen into this collection could reflect either extravasated enteric contrast medium or possible active hemorrhage although the attenuation is higher than expected. More intermediate attenuation material contained within the largest collection along the greater curvature and seen more diffusely throughout the abdomen and pelvis may reflect a combination of succus and hemorrhagic products. Extensive atelectatic changes in the lung bases. Stable sclerotic changes of the left ilium and femoral head. Findings could reflect sequela prior avascular necrosis or metabolic derangement, could consider MRI as was recommended on CT dated 11/19/2016. Post hysterectomy, cholecystectomy. Aortic atherosclerosis. These results were called by telephone at the time of interpretation on 02/17/2019 at 2:31 pm to Dr. Feliciana Rossetti , who verbally acknowledged these results. Electronically Signed   By: MD Kreg Shropshire   On: 02/17/2019 14:32   Dg Chest Port 1 View  Result Date: 02/24/2019 CLINICAL DATA:  Shortness of breath EXAM: PORTABLE CHEST 1 VIEW COMPARISON:  02/22/2019, 09/04/2018 FINDINGS: Improved aeration since 02/22/2019. Platelike areas of atelectasis at the left base and right infrahilar lung. No pleural effusion. Mild cardiomegaly. No pneumothorax. IMPRESSION: Suspected subsegmental atelectasis in the right infrahilar lung and left base. Cardiomegaly without edema. Electronically Signed   By: Jasmine Pang M.D.   On: 02/24/2019 03:28   Dg Chest Port 1 View  Result Date: 02/22/2019 CLINICAL DATA:  Acute shortness of breath today EXAM: PORTABLE CHEST  1 VIEW COMPARISON:  09/04/2018 FINDINGS: Cardiomegaly again noted. Bilateral LOWER lung opacities are present and may represent atelectasis and/or airspace disease. No definite pleural effusion. No  pneumothorax or acute bony abnormality. IMPRESSION: Bilateral LOWER lung opacities which may represent atelectasis and/or airspace disease. Cardiomegaly. Electronically Signed   By: Harmon Pier M.D.   On: 02/22/2019 09:36       Discharge Exam: Vitals:   03/01/19 2151 03/02/19 0544  BP: (!) 141/76 (!) 151/75  Pulse: 71 64  Resp:    Temp: 99.4 F (37.4 C) 98.9 F (37.2 C)  SpO2: 96% 91%    General: Pt is alert, awake, not in acute distress Cardiovascular: RRR, S1/S2 +, no rubs, no gallops Respiratory: CTA bilaterally, no wheezing, no rhonchi Abdominal: Abdominal binder in place Extremities: no edema, no cyanosis    The results of significant diagnostics from this hospitalization (including imaging, microbiology, ancillary and laboratory) are listed below for reference.     Microbiology: Recent Results (from the past 240 hour(s))  SARS Coronavirus 2 (CEPHEID - Performed in [facility] hospital lab), Hosp Order     Status: None   Collection Time: 02/24/19  5:40 AM   Specimen: Nasopharyngeal Swab  Result Value Ref Range Status   SARS Coronavirus 2 NEGATIVE NEGATIVE Final    Comment: (NOTE) If result is NEGATIVE SARS-CoV-2 target nucleic acids are NOT DETECTED. The SARS-CoV-2 RNA is generally detectable in upper and lower  respiratory specimens during the acute phase of infection. The lowest  concentration of SARS-CoV-2 viral copies this assay can detect is 250  copies / mL. A negative result does not preclude SARS-CoV-2 infection  and should not be used as the sole basis for treatment or other  patient management decisions.  A negative result may occur with  improper specimen collection / handling, submission of specimen other  than nasopharyngeal swab, presence of viral mutation(s) within the  areas targeted by this assay, and inadequate number of viral copies  (<250 copies / mL). A negative result must be combined with clinical  observations, patient history, and  epidemiological information. If result is POSITIVE SARS-CoV-2 target nucleic acids are DETECTED. The SARS-CoV-2 RNA is generally detectable in upper and lower  respiratory specimens dur ing the acute phase of infection.  Positive  results are indicative of active infection with SARS-CoV-2.  Clinical  correlation with patient history and other diagnostic information is  necessary to determine patient infection status.  Positive results do  not rule out bacterial infection or co-infection with other viruses. If result is PRESUMPTIVE POSTIVE SARS-CoV-2 nucleic acids MAY BE PRESENT.   A presumptive positive result was obtained on the submitted specimen  and confirmed on repeat testing.  While 2019 novel coronavirus  (SARS-CoV-2) nucleic acids may be present in the submitted sample  additional confirmatory testing may be necessary for epidemiological  and / or clinical management purposes  to differentiate between  SARS-CoV-2 and other Sarbecovirus currently known to infect humans.  If clinically indicated additional testing with an alternate test  methodology [id number]) is advised. The SARS-CoV-2 RNA is generally  detectable in upper and lower respiratory sp ecimens during the acute  phase of infection. The expected result is Negative. Fact Sheet for Patients:  BoilerBrush.com.cy Fact Sheet for Healthcare Providers: https://pope.com/ This test is not yet approved or cleared by the [place] FDA and has been authorized for detection and/or diagnosis of SARS-CoV-2 by FDA under an Emergency Use Authorization (EUA).  This EUA will remain in  effect (meaning this test can be used) for the duration of the COVID-19 declaration under Section 564(b)(1) of the Act, 21 U.S.C. section 360bbb-3(b)(1), unless the authorization is terminated or revoked sooner. Performed at [facility], [address] [place], [place] [address]   C difficile quick  scan w PCR reflex     Status: None   Collection Time: 02/25/19 11:40 AM   Specimen: Stool  Result Value Ref Range Status   C Diff antigen NEGATIVE NEGATIVE Final   C Diff toxin NEGATIVE NEGATIVE Final   C Diff interpretation No C. difficile detected.  Final    Comment: Performed at [facility], [address], [place], [place] [address]  Gastrointestinal Panel by PCR , Stool     Status: None   Collection Time: 02/25/19 11:40 AM  Result Value Ref Range Status   Campylobacter species NOT DETECTED NOT DETECTED Final   Plesimonas shigelloides NOT DETECTED NOT DETECTED Final   Salmonella species NOT DETECTED NOT DETECTED Final   Yersinia enterocolitica NOT DETECTED NOT DETECTED Final   Vibrio species NOT DETECTED NOT DETECTED Final   Vibrio cholerae NOT DETECTED NOT DETECTED Final   Enteroaggregative E coli (EAEC) NOT DETECTED NOT DETECTED Final   Enteropathogenic E coli (EPEC) NOT DETECTED NOT DETECTED Final   Enterotoxigenic E coli (ETEC) NOT DETECTED NOT DETECTED Final   Shiga like toxin producing E coli (STEC) NOT DETECTED NOT DETECTED Final   Shigella/Enteroinvasive E coli (EIEC) NOT DETECTED NOT DETECTED Final   Cryptosporidium NOT DETECTED NOT DETECTED Final   Cyclospora cayetanensis NOT DETECTED NOT DETECTED Final   Entamoeba histolytica NOT DETECTED NOT DETECTED Final   Giardia lamblia NOT DETECTED NOT DETECTED Final   Adenovirus F40/41 NOT DETECTED NOT DETECTED Final   Astrovirus NOT DETECTED NOT DETECTED Final   Norovirus GI/GII NOT DETECTED NOT DETECTED Final   Rotavirus A NOT DETECTED NOT DETECTED Final   Sapovirus (I, II, IV, and V) NOT DETECTED NOT DETECTED Final    Comment: Performed at [facility], [address] [place], [place] [address]     Labs: BNP (last 3 results) Recent Labs    02/24/19 0355  BNP 81.0   Basic Metabolic Panel: Recent Labs  Lab 02/26/19 0245 02/27/19 0301 02/28/19 0336 03/01/19 0302 03/02/19 0356  NA 143 141 140 140  140  K 3.2* 3.1* 3.5 3.7 3.8  CL 107 105 104 104 103  CO2 GLUCOSE 69* 92 106* 98 91  BUN 5* <5* <5* <5* <5*  CREATININE 0.72 0.63 0.58 0.61 0.69  CALCIUM 8.3* 8.5* 8.4* 8.4* 8.6*  MG 1.9 1.7 1.6* 1.8 1.9   Liver Function Tests: Recent Labs  Lab 02/24/19 0355 02/25/19 0341  AST 22 11*  ALT 45* 29  ALKPHOS 77 60  BILITOT 0.6 0.8  PROT 7.2 6.2*  ALBUMIN 2.5* 1.9*   Recent Labs  Lab 02/24/19 0355  LIPASE 33   No results for input(s): AMMONIA in the last 168 hours. CBC: Recent Labs  Lab 02/24/19 0355  02/26/19 0245 02/27/19 0301 02/28/19 0336 03/01/19 0302 03/02/19 0356  WBC 14.7*   < > 10.9* 10.4 8.6 8.0 7.6  NEUTROABS 12.4*  --   --   --   --   --   --   HGB 9.0*   < > 7.8* 8.1* 8.0* 8.1* 8.3*  HCT 27.8*   < > 25.1* 25.0* 24.7* 25.6* 26.4*  MCV 89.1   < >  90.9 88.7 90.8 90.8 91.3  PLT 409*   < > 500* 550* 512* 568* 541*   < > = values in this interval not displayed.   Cardiac Enzymes: No results for input(s): CKTOTAL, CKMB, CKMBINDEX, TROPONINI in the last 168 hours. BNP: Invalid input(s): POCBNP CBG: No results for input(s): GLUCAP in the last 168 hours. D-Dimer No results for input(s): DDIMER in the last 72 hours. Hgb A1c No results for input(s): HGBA1C in the last 72 hours. Lipid Profile No results for input(s): CHOL, HDL, LDLCALC, TRIG, CHOLHDL, LDLDIRECT in the last 72 hours. Thyroid function studies No results for input(s): TSH, T4TOTAL, T3FREE, THYROIDAB in the last 72 hours.  Invalid input(s): FREET3 Anemia work up No results for input(s): VITAMINB12, FOLATE, FERRITIN, TIBC, IRON, RETICCTPCT in the last 72 hours. Urinalysis    Component Value Date/Time   COLORURINE YELLOW 11/19/2016 0820   APPEARANCEUR HAZY (A) 11/19/2016 0820   LABSPEC 1.012 11/19/2016 0820   PHURINE 5.0 11/19/2016 0820   GLUCOSEU NEGATIVE 11/19/2016 0820   HGBUR SMALL (A) 11/19/2016 0820   BILIRUBINUR NEGATIVE 11/19/2016 0820   KETONESUR NEGATIVE  11/19/2016 0820   PROTEINUR NEGATIVE 11/19/2016 0820   UROBILINOGEN 0.2 05/13/2010 1636   NITRITE POSITIVE (A) 11/19/2016 0820   LEUKOCYTESUR MODERATE (A) 11/19/2016 0820   Sepsis Labs Invalid input(s): PROCALCITONIN,  WBC,  LACTICIDVEN Microbiology Recent Results (from the past 240 hour(s))  SARS Coronavirus 2 (CEPHEID - Performed in [facility] hospital lab), Hosp Order     Status: None   Collection Time: 02/24/19  5:40 AM   Specimen: Nasopharyngeal Swab  Result Value Ref Range Status   SARS Coronavirus 2 NEGATIVE NEGATIVE Final    Comment: (NOTE) If result is NEGATIVE SARS-CoV-2 target nucleic acids are NOT DETECTED. The SARS-CoV-2 RNA is generally detectable in upper and lower  respiratory specimens during the acute phase of infection. The lowest  concentration of SARS-CoV-2 viral copies this assay can detect is 250  copies / mL. A negative result does not preclude SARS-CoV-2 infection  and should not be used as the sole basis for treatment or other  patient management decisions.  A negative result may occur with  improper specimen collection / handling, submission of specimen other  than nasopharyngeal swab, presence of viral mutation(s) within the  areas targeted by this assay, and inadequate number of viral copies  (<250 copies / mL). A negative result must be combined with clinical  observations, patient history, and epidemiological information. If result is POSITIVE SARS-CoV-2 target nucleic acids are DETECTED. The SARS-CoV-2 RNA is generally detectable in upper and lower  respiratory specimens dur ing the acute phase of infection.  Positive  results are indicative of active infection with SARS-CoV-2.  Clinical  correlation with patient history and other diagnostic information is  necessary to determine patient infection status.  Positive results do  not rule out bacterial infection or co-infection with other viruses. If result is PRESUMPTIVE POSTIVE SARS-CoV-2 nucleic  acids MAY BE PRESENT.   A presumptive positive result was obtained on the submitted specimen  and confirmed on repeat testing.  While 2019 novel coronavirus  (SARS-CoV-2) nucleic acids may be present in the submitted sample  additional confirmatory testing may be necessary for epidemiological  and / or clinical management purposes  to differentiate between  SARS-CoV-2 and other Sarbecovirus currently known to infect humans.  If clinically indicated additional testing with an alternate test  methodology [phone]) is advised. The SARS-CoV-2 RNA is generally  detectable in  upper and lower respiratory sp ecimens during the acute  phase of infection. The expected result is Negative. Fact Sheet for Patients:  BoilerBrush.com.cyhttps://www.fda.gov/media/136312/download Fact Sheet for Healthcare Providers: https://pope.com/https://www.fda.gov/media/136313/download This test is not yet approved or cleared by the [place] FDA and has been authorized for detection and/or diagnosis of SARS-CoV-2 by FDA under an Emergency Use Authorization (EUA).  This EUA will remain in effect (meaning this test can be used) for the duration of the COVID-19 declaration under Section 564(b)(1) of the Act, 21 U.S.C. section 360bbb-3(b)(1), unless the authorization is terminated or revoked sooner. Performed at [facility], [address] [place], [place] [address]   C difficile quick scan w PCR reflex     Status: None   Collection Time: 02/25/19 11:40 AM   Specimen: Stool  Result Value Ref Range Status   C Diff antigen NEGATIVE NEGATIVE Final   C Diff toxin NEGATIVE NEGATIVE Final   C Diff interpretation No C. difficile detected.  Final    Comment: Performed at [facility], [address] [place], [place] [address]  Gastrointestinal Panel by PCR , Stool     Status: None   Collection Time: 02/25/19 11:40 AM  Result Value Ref Range Status   Campylobacter species NOT DETECTED NOT DETECTED Final   Plesimonas shigelloides NOT DETECTED  NOT DETECTED Final   Salmonella species NOT DETECTED NOT DETECTED Final   Yersinia enterocolitica NOT DETECTED NOT DETECTED Final   Vibrio species NOT DETECTED NOT DETECTED Final   Vibrio cholerae NOT DETECTED NOT DETECTED Final   Enteroaggregative E coli (EAEC) NOT DETECTED NOT DETECTED Final   Enteropathogenic E coli (EPEC) NOT DETECTED NOT DETECTED Final   Enterotoxigenic E coli (ETEC) NOT DETECTED NOT DETECTED Final   Shiga like toxin producing E coli (STEC) NOT DETECTED NOT DETECTED Final   Shigella/Enteroinvasive E coli (EIEC) NOT DETECTED NOT DETECTED Final   Cryptosporidium NOT DETECTED NOT DETECTED Final   Cyclospora cayetanensis NOT DETECTED NOT DETECTED Final   Entamoeba histolytica NOT DETECTED NOT DETECTED Final   Giardia lamblia NOT DETECTED NOT DETECTED Final   Adenovirus F40/41 NOT DETECTED NOT DETECTED Final   Astrovirus NOT DETECTED NOT DETECTED Final   Norovirus GI/GII NOT DETECTED NOT DETECTED Final   Rotavirus A NOT DETECTED NOT DETECTED Final   Sapovirus (I, II, IV, and V) NOT DETECTED NOT DETECTED Final    Comment: Performed at [facility], [address] [place], [place] [address]     Patient was seen and examined on the day of discharge and was found to be in stable condition. Time coordinating discharge: 25 minutes including assessment and coordination of care, as well as examination of the patient.   SIGNED:  Noralee StainJennifer Welby Montminy, DO [facility] [email or web address] 03/02/2019, 10:51 AM

## 2019-03-02 NOTE — Discharge Instructions (Signed)
° ° ° °GASTRIC BYPASS/SLEEVE ° Home Care Instructions ° ° These instructions are to help you care for yourself when you go home. ° °Call: If you have any problems. °• Call [phone] and ask for the surgeon on call °• If you need immediate help, come to the ER at .  °• Tell the ER staff that you are a new post-op gastric bypass or gastric sleeve patient °  °Signs and symptoms to report: • Severe vomiting or nausea °o If you cannot keep down clear liquids for longer than 1 day, call your surgeon  °• Abdominal pain that does not get better after taking your pain medication °• Fever over 100.4° F with chills °• Heart beating over 100 beats a minute °• Shortness of breath at rest °• Chest pain °•  Redness, swelling, drainage, or foul odor at incision (surgical) sites °•  If your incisions open or pull apart °• Swelling or pain in calf (lower leg) °• Diarrhea (Loose bowel movements that happen often), frequent watery, uncontrolled bowel movements °• Constipation, (no bowel movements for 3 days) if this happens: Pick one °o Milk of Magnesia, 2 tablespoons by mouth, 3 times a day for 2 days if needed °o Stop taking Milk of Magnesia once you have a bowel movement °o Call your doctor if constipation continues °Or °o Miralax  (instead of Milk of Magnesia) following the label instructions °o Stop taking Miralax once you have a bowel movement °o Call your doctor if constipation continues °• Anything you think is not normal °  °Normal side effects after surgery: • Unable to sleep at night or unable to focus °• Irritability or moody °• Being tearful (crying) or depressed °These are common complaints, possibly related to your anesthesia medications that put you to sleep, stress of surgery, and change in lifestyle.  This usually goes away a few weeks after surgery.  If these feelings continue, call your primary care doctor. °  °Wound Care: You may have surgical glue, steri-strips, or staples over your incisions after  surgery °• Surgical glue:  Looks like a clear film over your incisions and will wear off a little at a time °• Steri-strips: Strips of tape over your incisions. You may notice a yellowish color on the skin under the steri-strips. This is used to make the   steri-strips stick better. Do not pull the steri-strips off - let them fall off °• Staples: Staples may be removed before you leave the hospital °o If you go home with staples, call [facility], [phone] at for an appointment with your surgeon’s nurse to have staples removed 10 days after surgery. °• Showering: You may shower two (2) days after your surgery unless your surgeon tells you differently °o Wash gently around incisions with warm soapy water, rinse well, and gently pat dry  °o No tub baths until staples are removed, steri-strips fall off or glue is gone.  °  °Medications: • Medications should be liquid or crushed if larger than the size of a dime °• Extended release pills (medication that release a little bit at a time through the day) should NOT be crushed or cut. (examples include XL, ER, DR, SR) °• Depending on the size and number of medications you take, you may need to space (take a few throughout the day)/change the time you take your medications so that you do not over-fill your pouch (smaller stomach) °• Make sure you follow-up with your primary care doctor to   make medication changes needed during rapid weight loss and life-style changes °• If you have diabetes, follow up with the doctor that orders your diabetes medication(s) within one week after surgery and check your blood sugar regularly. °• Do not drive while taking prescription pain medication  °• It is ok to take Tylenol by the bottle instructions with your pain medicine or instead of your pain medicine as needed.  DO NOT TAKE NSAIDS (EXAMPLES OF NSAIDS:  IBUPROFREN/ NAPROXEN)  °Diet:                    First 2 Weeks ° You will see the dietician t about two (2) weeks  after your surgery. The dietician will increase the types of foods you can eat if you are handling liquids well: °• If you have severe vomiting or nausea and cannot keep down clear liquids lasting longer than 1 day, call your surgeon @ ([phone]) °Protein Shake °• Drink at least 2 ounces of shake 5-6 times per day °• Each serving of protein shakes (usually 8 - 12 ounces) should have: °o 15 grams of protein  °o And no more than 5 grams of carbohydrate  °• Goal for protein each day: °o Men = 80 grams per day °o Women = 60 grams per day °• Protein powder may be added to fluids such as non-fat milk or Lactaid milk or unsweetened Soy/Almond milk (limit to 35 grams added protein powder per serving) ° °Hydration °• Slowly increase the amount of water and other clear liquids as tolerated (See Acceptable Fluids) °• Slowly increase the amount of protein shake as tolerated  °•  Sip fluids slowly and throughout the day.  Do not use straws. °• May use sugar substitutes in small amounts (no more than 6 - 8 packets per day; i.e. Splenda) ° °Fluid Goal °• The first goal is to drink at least 8 ounces of protein shake/drink per day (or as directed by the nutritionist); some examples of protein shakes are Syntrax Nectar, Adkins Advantage, EAS Edge HP, and Unjury. See handout from pre-op Bariatric Education Class: °o Slowly increase the amount of protein shake you drink as tolerated °o You may find it easier to slowly sip shakes throughout the day °o It is important to get your proteins in first °• Your fluid goal is to drink 64 - 100 ounces of fluid daily °o It may take a few weeks to build up to this °• 32 oz (or more) should be clear liquids  °And  °• 32 oz (or more) should be full liquids (see below for examples) °• Liquids should not contain sugar, caffeine, or carbonation ° °Clear Liquids: °• Water or Sugar-free flavored water (i.e. Fruit H2O, Propel) °• Decaffeinated coffee or tea (sugar-free) °• Crystal Lite, Wyler’s Lite,  Minute Maid Lite °• Sugar-free Jell-O °• Bouillon or broth °• Sugar-free Popsicle:   *Less than 20 calories each; Limit 1 per day ° °Full Liquids: °Protein Shakes/Drinks + 2 choices per day of other full liquids °• Full liquids must be: °o No More Than 15 grams of Carbs per serving  °o No More Than 3 grams of Fat per serving °• Strained low-fat cream soup (except Cream of Potato or Tomato) °• Non-Fat milk °• Fat-free Lactaid Milk °• Unsweetened Soy Or Unsweetened Almond Milk °• Low Sugar yogurt (Dannon Lite & Fit, Greek yogurt; Oikos Triple Zero; Chobani Simply 100; Yoplait 100 calorie Greek - No Fruit on the Bottom) ° °  °Vitamins   and Minerals • Start 1 day after surgery unless otherwise directed by your surgeon °• 2 Chewable Bariatric Specific Multivitamin / Multimineral Supplement with iron (Example: Bariatric Advantage Multi EA) °• Chewable Calcium with Vitamin D-3 °(Example: 3 Chewable Calcium Plus 600 with Vitamin D-3) °o Take 500 mg three (3) times a day for a total of 1500 mg each day °o Do not take all 3 doses of calcium at one time as it may cause constipation, and you can only absorb 500 mg  at a time  °o Do not mix multivitamins containing iron with calcium supplements; take 2 hours apart °• Menstruating women and those with a history of anemia (a blood disease that causes weakness) may need extra iron °o Talk with your doctor to see if you need more iron °• Do not stop taking or change any vitamins or minerals until you talk to your dietitian or surgeon °• Your Dietitian and/or surgeon must approve all vitamin and mineral supplements °  °Activity and Exercise: Limit your physical activity as instructed by your doctor.  It is important to continue walking at home.  During this time, use these guidelines: °• Do not lift anything greater than ten (10) pounds for at least two (2) weeks °• Do not go back to work or drive until your surgeon says you can °• You may have sex when you feel comfortable  °o It is  VERY important for female patients to use a reliable birth control method; fertility often increases after surgery  °o All hormonal birth control will be ineffective for 30 days after surgery due to medications given during surgery a barrier method must be used. °o Do not get pregnant for at least 18 months °• Start exercising as soon as your doctor tells you that you can °o Make sure your doctor approves any physical activity °• Start with a simple walking program °• Walk 5-15 minutes each day, 7 days per week.  °• Slowly increase until you are walking 30-45 minutes per day °Consider joining our BELT program. [phone] or email belt@[email or web address] °  °Special Instructions Things to remember: °• Use your CPAP when sleeping if this applies to you ° °• [facility] has two free Bariatric Surgery Support Groups that meet monthly °o The 3rd Thursday of each month, 6 pm, [facility]  °o The 2nd Friday of each month, 11:45 am in the private dining room in the basement of [place] [facility] [place] °• It is very important to keep all follow up appointments with your surgeon, dietitian, primary care physician, and behavioral health practitioner °• Routine follow up schedule with your surgeon include appointments at 2-3 weeks, 6-8 weeks, 6 months, and 1 year at a minimum.  Your surgeon may request to see you more often.   °o After the first year, please follow up with your bariatric surgeon and dietitian at least once a year in order to maintain best weight loss results °[facility]: [phone] °[facility]: [phone] °Bariatric Nurse Coordinator: [phone] °  °   Reviewed and Endorsed  °by [place] Patient Education Committee, June, 2016 °Edits Approved: Aug, 2018 ° ° ° °

## 2019-03-02 NOTE — Progress Notes (Signed)
Physical Therapy Treatment Patient Details Name: [name] MRN: [id number] DOB: 06-Oct-1960 Today's Date: 03/02/2019    History of Present Illness 58 yo female with onset of N&V after a gastric sleeve on 7/8, has PNA on recent hosp and now in with bleeding from sx and ligated a blood vessel, transfused 3 units PRBC's.  Now referred to PT with a line for loose diarrhea, has continued leakage from the line. PMHx:  chest pain, GERD, HTN, PNA, pulm sarcoidosis, hysterectomy, appendectomy,     PT Comments    Pt was seen for a short walk and trip to [place] where she demonstrated mod I transfer with wall bar.  Pt was then sitting on side of bed on phone, and then was seen by MD to change bandage.  No further therapy could be completed as pt is in pain from change of bandage.  Will expect dc to home today and will not see her unless dc is held.   Follow Up Recommendations  No PT follow up     Equipment Recommendations  None recommended by PT    Recommendations for Other Services       Precautions / Restrictions Precautions Precautions: Fall Precaution Comments: ck HR Restrictions Weight Bearing Restrictions: No    Mobility  Bed Mobility Overal bed mobility: Modified Independent                Transfers Overall transfer level: Modified independent Equipment used: None                Ambulation/Gait Ambulation/Gait assistance: Supervision Gait Distance (Feet): 40 Feet Assistive device: IV Pole Gait Pattern/deviations: Step-through pattern;Decreased stride length;Narrow base of support(more upright this visit) Gait velocity: reduced Gait velocity interpretation: <1.31 ft/sec, indicative of household Metallurgist Rankin (Stroke Patients Only)       Balance Overall balance assessment: Needs assistance Sitting-balance support: Feet supported Sitting balance-Leahy Scale: Good       Standing  balance-Leahy Scale: Fair                              Cognition Arousal/Alertness: Awake/alert Behavior During Therapy: WFL for tasks assessed/performed Overall Cognitive Status: Within Functional Limits for tasks assessed                                        Exercises      General Comments        Pertinent Vitals/Pain Pain Assessment: Faces Faces Pain Scale: Hurts even more Pain Location: lower abdomen Pain Descriptors / Indicators: Operative site guarding Pain Intervention(s): Monitored during session;Premedicated before session;Repositioned    Home Living                      Prior Function            PT Goals (current goals can now be found in the care plan section) Progress towards PT goals: Progressing toward goals    Frequency    Min 3X/week      PT Plan Current plan remains appropriate    Co-evaluation              AM-PAC PT "6 Clicks" Mobility   Outcome Measure  Help needed turning  from your back to your side while in a flat bed without using bedrails?: None Help needed moving from lying on your back to sitting on the side of a flat bed without using bedrails?: None Help needed moving to and from a bed to a chair (including a wheelchair)?: None Help needed standing up from a chair using your arms (e.g., wheelchair or bedside chair)?: None Help needed to walk in hospital room?: A Little Help needed climbing 3-5 steps with a railing? : A Little 6 Click Score: 22    End of Session   Activity Tolerance: Patient limited by fatigue Patient left: in bed;with call bell/phone within reach Nurse Communication: Mobility status PT Visit Diagnosis: Other abnormalities of gait and mobility (R26.89);Pain Pain - part of body: (abdomen)     Time: 1320-1330 PT Time Calculation (min) (ACUTE ONLY): 10 min  Charges:  $Gait Training: 8-22 mins                   Ivar DrapeRuth E Roselyne Stalnaker 03/02/2019, 2:34 PM   Samul Dadauth Ali Mclaurin, PT  MS Acute Rehab Dept. Number: [facility] [phone] and [facility] [phone]

## 2019-03-02 NOTE — Progress Notes (Signed)
Patient ID: Allison RaringBarbara Chase, female   DOB: 1960-09-15, 58 y.o.   MRN: [id number]       Subjective: No new complaints.  Tolerating bari fulls.  Objective: Vital signs in last 24 hours: Temp:  [98.8 F (37.1 C)-99.4 F (37.4 C)] 98.9 F (37.2 C) (07/21 0544) Pulse Rate:  [64-71] 64 (07/21 0544) Resp:  [18] 18 (07/20 1354) BP: (138-151)/(65-76) 151/75 (07/21 0544) SpO2:  [91 %-96 %] 91 % (07/21 0544) Weight:  [106.8 kg] 106.8 kg (07/21 0427) Last BM Date: 02/28/19  Intake/Output from previous day: 07/20 0701 - 07/21 0700 In: 500 [P.O.:500] Out: -  Intake/Output this shift: No intake/output data recorded.  PE: Abd: soft, appropriately tender, abdominal binder in place.  Midline wound with staples intact, but new dressing in place this morning saturated with purulent drainage.  New dressing placed.    Lab Results:  Recent Labs    03/01/19 0302 03/02/19 0356  WBC 8.0 7.6  HGB 8.1* 8.3*  HCT 25.6* 26.4*  PLT 568* 541*   BMET Recent Labs    03/01/19 0302 03/02/19 0356  NA 140 140  K 3.7 3.8  CL 104 103  CO2 27 29  GLUCOSE 98 91  BUN <5* <5*  CREATININE 0.61 0.69  CALCIUM 8.4* 8.6*   PT/INR No results for input(s): LABPROT, INR in the last 72 hours. CMP     Component Value Date/Time   NA 140 03/02/2019 0356   NA 139 04/11/2015 1220   K 3.8 03/02/2019 0356   CL 103 03/02/2019 0356   CO2 29 03/02/2019 0356   GLUCOSE 91 03/02/2019 0356   BUN <5 (L) 03/02/2019 0356   BUN 8 04/11/2015 1220   CREATININE 0.69 03/02/2019 0356   CALCIUM 8.6 (L) 03/02/2019 0356   PROT 6.2 (L) 02/25/2019 0341   PROT 8.3 04/11/2015 1220   ALBUMIN 1.9 (L) 02/25/2019 0341   ALBUMIN 4.1 04/11/2015 1220   AST 11 (L) 02/25/2019 0341   ALT 29 02/25/2019 0341   ALKPHOS 60 02/25/2019 0341   BILITOT 0.8 02/25/2019 0341   BILITOT 0.4 04/11/2015 1220   GFRNONAA >60 03/02/2019 0356   GFRAA >60 03/02/2019 0356   Lipase     Component Value Date/Time   LIPASE 33 02/24/2019 0355        Studies/Results: No results found.  Anti-infectives: Anti-infectives (From admission, onward)   Start     Dose/Rate Route Frequency Ordered Stop   02/27/19 1200  ceFAZolin (ANCEF) IVPB 1 g/50 mL premix     1 g 100 mL/hr over 30 Minutes Intravenous Every 8 hours 02/27/19 1047     02/24/19 0845  cefTRIAXone (ROCEPHIN) 1 g in sodium chloride 0.9 % 100 mL IVPB     1 g 200 mL/hr over 30 Minutes Intravenous Every 24 hours 02/24/19 0830 02/26/19 0855   02/24/19 0845  azithromycin (ZITHROMAX) 500 mg in sodium chloride 0.9 % 250 mL IVPB     500 mg 250 mL/hr over 60 Minutes Intravenous Every 24 hours 02/24/19 0830 02/26/19 1116       Assessment/Plan 58 yo female s/p sleeve gastrectomy, reexploration for bleeding, readmitted for diarrhea, now with concern for wound infection. CT shows fascial edges have dehisced.  -wound still with purulent drainage, but still remains AF with a normal WBC. -staples remain in place to minimize wound separation. -defer further plan of care to Dr. Andrey CampanileWilson after he sees the patient.  FEN - bari fulls VTE - Lovenox ID - ancef  LOS: 5 days    Henreitta Cea , [facility] 03/02/2019, 9:13 AM Pager: [phone]

## 2019-03-03 DIAGNOSIS — I10 Essential (primary) hypertension: Secondary | ICD-10-CM | POA: Diagnosis not present

## 2019-03-03 DIAGNOSIS — K219 Gastro-esophageal reflux disease without esophagitis: Secondary | ICD-10-CM | POA: Diagnosis not present

## 2019-03-03 DIAGNOSIS — K9161 Intraoperative hemorrhage and hematoma of a digestive system organ or structure complicating a digestive sytem procedure: Secondary | ICD-10-CM | POA: Diagnosis not present

## 2019-03-03 DIAGNOSIS — E669 Obesity, unspecified: Secondary | ICD-10-CM | POA: Diagnosis not present

## 2019-03-03 DIAGNOSIS — Z9884 Bariatric surgery status: Secondary | ICD-10-CM | POA: Diagnosis not present

## 2019-03-03 DIAGNOSIS — Z48815 Encounter for surgical aftercare following surgery on the digestive system: Secondary | ICD-10-CM | POA: Diagnosis not present

## 2019-03-03 DIAGNOSIS — J189 Pneumonia, unspecified organism: Secondary | ICD-10-CM | POA: Diagnosis not present

## 2019-03-03 DIAGNOSIS — F341 Dysthymic disorder: Secondary | ICD-10-CM | POA: Diagnosis not present

## 2019-03-03 DIAGNOSIS — D860 Sarcoidosis of lung: Secondary | ICD-10-CM | POA: Diagnosis not present

## 2019-03-04 DIAGNOSIS — Z48815 Encounter for surgical aftercare following surgery on the digestive system: Secondary | ICD-10-CM | POA: Diagnosis not present

## 2019-03-04 DIAGNOSIS — E669 Obesity, unspecified: Secondary | ICD-10-CM | POA: Diagnosis not present

## 2019-03-04 DIAGNOSIS — J189 Pneumonia, unspecified organism: Secondary | ICD-10-CM | POA: Diagnosis not present

## 2019-03-04 DIAGNOSIS — D860 Sarcoidosis of lung: Secondary | ICD-10-CM | POA: Diagnosis not present

## 2019-03-04 DIAGNOSIS — K9161 Intraoperative hemorrhage and hematoma of a digestive system organ or structure complicating a digestive sytem procedure: Secondary | ICD-10-CM | POA: Diagnosis not present

## 2019-03-04 DIAGNOSIS — Z9884 Bariatric surgery status: Secondary | ICD-10-CM | POA: Diagnosis not present

## 2019-03-08 ENCOUNTER — Telehealth (HOSPITAL_COMMUNITY): Payer: Self-pay

## 2019-03-08 ENCOUNTER — Telehealth: Payer: Self-pay | Admitting: Skilled Nursing Facility1

## 2019-03-08 DIAGNOSIS — D860 Sarcoidosis of lung: Secondary | ICD-10-CM | POA: Diagnosis not present

## 2019-03-08 DIAGNOSIS — J189 Pneumonia, unspecified organism: Secondary | ICD-10-CM | POA: Diagnosis not present

## 2019-03-08 DIAGNOSIS — Z9884 Bariatric surgery status: Secondary | ICD-10-CM | POA: Diagnosis not present

## 2019-03-08 DIAGNOSIS — K9161 Intraoperative hemorrhage and hematoma of a digestive system organ or structure complicating a digestive sytem procedure: Secondary | ICD-10-CM | POA: Diagnosis not present

## 2019-03-08 DIAGNOSIS — Z48815 Encounter for surgical aftercare following surgery on the digestive system: Secondary | ICD-10-CM | POA: Diagnosis not present

## 2019-03-08 DIAGNOSIS — E669 Obesity, unspecified: Secondary | ICD-10-CM | POA: Diagnosis not present

## 2019-03-08 NOTE — Telephone Encounter (Signed)
Called to check on patient post discharge.  She stated she was having some pain controlled with medication.  HHRN has been helping with dressing changes.  Unable to tell me how much protein/fluid intake.  Discussed that protein is extremely important from bariatric standpoint and wound healing.  Patient has been taking in broth and soups. She has tried yogurt successfully.  We discussed possibly adding some cottage cheese, deli meat, or string cheese to diet.  Stated understanding. Questions answered.  To see surgeon 7/29 per patient.

## 2019-03-08 NOTE — Telephone Encounter (Signed)
Called pt to make an appt.  Pt was annoyed for the call and states she does not want to set up an appt she is busy.   Advised pt whenever she was ready to give [facility] a call back.

## 2019-03-09 ENCOUNTER — Telehealth: Payer: Self-pay | Admitting: Skilled Nursing Facility1

## 2019-03-09 NOTE — Telephone Encounter (Signed)
Called pt to ask if she would be open to a MyChart appotinemnt, pt staets she does not have a computer. Tehn asked if she could do a phone appt pt states yes but only if it is right now. Dietitian only had 10 minutes to conduct the appointment but helped her to at least get started with progressing her diet.   Pt states she Feels a mess.  Can drink okay but cannot hold down a whole lot of anything. Was talking to other people while on the phone.   Pt states the Only thing she is putting in her mouth is plain water drinking 4-5 bottles of water  When asked why she does not drink protein shakes she responded they give her diarrhea so she has only drank water since being released from the hospital.    Pt states she boiled chicken and Tried chicken broth but not the chicken  Surgery date 02/17/2019 been out of hospital for 1 week   Goals: Try orgain protein shake look for  Vegan, vegetarian, plant based on the labels of protein shakes to try Also try: refried beans, soy crumbles, greek yogurt: not all at the same time  By the end of day: 1 protein shake and 1 greek yogurt   Will call pt tomorrow to check on progress

## 2019-03-10 ENCOUNTER — Telehealth: Payer: Self-pay | Admitting: Skilled Nursing Facility1

## 2019-03-10 NOTE — Telephone Encounter (Signed)
Dietitian called pt to check on progress   Has been eating Soy crumbles and eggs without issue  Able to eat and drink without issue  No vomiting or diarrhea   Advised to Eat: Any seafood don't deep fry or add butter   Since pt does not have the Internet dietitian will mail the post-op handout

## 2019-03-15 DIAGNOSIS — Z9884 Bariatric surgery status: Secondary | ICD-10-CM | POA: Diagnosis not present

## 2019-03-15 DIAGNOSIS — Z48815 Encounter for surgical aftercare following surgery on the digestive system: Secondary | ICD-10-CM | POA: Diagnosis not present

## 2019-03-15 DIAGNOSIS — K9161 Intraoperative hemorrhage and hematoma of a digestive system organ or structure complicating a digestive sytem procedure: Secondary | ICD-10-CM | POA: Diagnosis not present

## 2019-03-15 DIAGNOSIS — D860 Sarcoidosis of lung: Secondary | ICD-10-CM | POA: Diagnosis not present

## 2019-03-15 DIAGNOSIS — J189 Pneumonia, unspecified organism: Secondary | ICD-10-CM | POA: Diagnosis not present

## 2019-03-15 DIAGNOSIS — E669 Obesity, unspecified: Secondary | ICD-10-CM | POA: Diagnosis not present

## 2019-03-18 DIAGNOSIS — Z9884 Bariatric surgery status: Secondary | ICD-10-CM | POA: Diagnosis not present

## 2019-03-18 DIAGNOSIS — I10 Essential (primary) hypertension: Secondary | ICD-10-CM | POA: Diagnosis not present

## 2019-03-18 DIAGNOSIS — F419 Anxiety disorder, unspecified: Secondary | ICD-10-CM | POA: Diagnosis not present

## 2019-03-18 DIAGNOSIS — E785 Hyperlipidemia, unspecified: Secondary | ICD-10-CM | POA: Diagnosis not present

## 2019-03-23 DIAGNOSIS — E669 Obesity, unspecified: Secondary | ICD-10-CM | POA: Diagnosis not present

## 2019-03-23 DIAGNOSIS — K9161 Intraoperative hemorrhage and hematoma of a digestive system organ or structure complicating a digestive sytem procedure: Secondary | ICD-10-CM | POA: Diagnosis not present

## 2019-03-23 DIAGNOSIS — J189 Pneumonia, unspecified organism: Secondary | ICD-10-CM | POA: Diagnosis not present

## 2019-03-23 DIAGNOSIS — Z9884 Bariatric surgery status: Secondary | ICD-10-CM | POA: Diagnosis not present

## 2019-03-23 DIAGNOSIS — Z48815 Encounter for surgical aftercare following surgery on the digestive system: Secondary | ICD-10-CM | POA: Diagnosis not present

## 2019-03-23 DIAGNOSIS — D860 Sarcoidosis of lung: Secondary | ICD-10-CM | POA: Diagnosis not present

## 2019-03-31 DIAGNOSIS — D860 Sarcoidosis of lung: Secondary | ICD-10-CM | POA: Diagnosis not present

## 2019-03-31 DIAGNOSIS — K9161 Intraoperative hemorrhage and hematoma of a digestive system organ or structure complicating a digestive sytem procedure: Secondary | ICD-10-CM | POA: Diagnosis not present

## 2019-03-31 DIAGNOSIS — Z9884 Bariatric surgery status: Secondary | ICD-10-CM | POA: Diagnosis not present

## 2019-03-31 DIAGNOSIS — E669 Obesity, unspecified: Secondary | ICD-10-CM | POA: Diagnosis not present

## 2019-03-31 DIAGNOSIS — Z48815 Encounter for surgical aftercare following surgery on the digestive system: Secondary | ICD-10-CM | POA: Diagnosis not present

## 2019-03-31 DIAGNOSIS — J189 Pneumonia, unspecified organism: Secondary | ICD-10-CM | POA: Diagnosis not present

## 2019-04-02 DIAGNOSIS — I10 Essential (primary) hypertension: Secondary | ICD-10-CM | POA: Diagnosis not present

## 2019-04-02 DIAGNOSIS — F341 Dysthymic disorder: Secondary | ICD-10-CM | POA: Diagnosis not present

## 2019-04-02 DIAGNOSIS — Z48815 Encounter for surgical aftercare following surgery on the digestive system: Secondary | ICD-10-CM | POA: Diagnosis not present

## 2019-04-02 DIAGNOSIS — K9161 Intraoperative hemorrhage and hematoma of a digestive system organ or structure complicating a digestive sytem procedure: Secondary | ICD-10-CM | POA: Diagnosis not present

## 2019-04-02 DIAGNOSIS — K219 Gastro-esophageal reflux disease without esophagitis: Secondary | ICD-10-CM | POA: Diagnosis not present

## 2019-04-02 DIAGNOSIS — D860 Sarcoidosis of lung: Secondary | ICD-10-CM | POA: Diagnosis not present

## 2019-04-02 DIAGNOSIS — Z9884 Bariatric surgery status: Secondary | ICD-10-CM | POA: Diagnosis not present

## 2019-04-02 DIAGNOSIS — E669 Obesity, unspecified: Secondary | ICD-10-CM | POA: Diagnosis not present

## 2019-04-02 DIAGNOSIS — J189 Pneumonia, unspecified organism: Secondary | ICD-10-CM | POA: Diagnosis not present

## 2019-04-05 DIAGNOSIS — Z48815 Encounter for surgical aftercare following surgery on the digestive system: Secondary | ICD-10-CM | POA: Diagnosis not present

## 2019-04-05 DIAGNOSIS — K9161 Intraoperative hemorrhage and hematoma of a digestive system organ or structure complicating a digestive sytem procedure: Secondary | ICD-10-CM | POA: Diagnosis not present

## 2019-04-05 DIAGNOSIS — J189 Pneumonia, unspecified organism: Secondary | ICD-10-CM | POA: Diagnosis not present

## 2019-04-05 DIAGNOSIS — Z9884 Bariatric surgery status: Secondary | ICD-10-CM | POA: Diagnosis not present

## 2019-04-05 DIAGNOSIS — E669 Obesity, unspecified: Secondary | ICD-10-CM | POA: Diagnosis not present

## 2019-04-05 DIAGNOSIS — D860 Sarcoidosis of lung: Secondary | ICD-10-CM | POA: Diagnosis not present

## 2019-04-12 DIAGNOSIS — E669 Obesity, unspecified: Secondary | ICD-10-CM | POA: Diagnosis not present

## 2019-04-12 DIAGNOSIS — J189 Pneumonia, unspecified organism: Secondary | ICD-10-CM | POA: Diagnosis not present

## 2019-04-12 DIAGNOSIS — K9161 Intraoperative hemorrhage and hematoma of a digestive system organ or structure complicating a digestive sytem procedure: Secondary | ICD-10-CM | POA: Diagnosis not present

## 2019-04-12 DIAGNOSIS — Z9884 Bariatric surgery status: Secondary | ICD-10-CM | POA: Diagnosis not present

## 2019-04-12 DIAGNOSIS — Z48815 Encounter for surgical aftercare following surgery on the digestive system: Secondary | ICD-10-CM | POA: Diagnosis not present

## 2019-04-12 DIAGNOSIS — D860 Sarcoidosis of lung: Secondary | ICD-10-CM | POA: Diagnosis not present

## 2019-04-13 DIAGNOSIS — E669 Obesity, unspecified: Secondary | ICD-10-CM | POA: Diagnosis not present

## 2019-04-13 DIAGNOSIS — J189 Pneumonia, unspecified organism: Secondary | ICD-10-CM | POA: Diagnosis not present

## 2019-04-13 DIAGNOSIS — Z9884 Bariatric surgery status: Secondary | ICD-10-CM | POA: Diagnosis not present

## 2019-04-13 DIAGNOSIS — D860 Sarcoidosis of lung: Secondary | ICD-10-CM | POA: Diagnosis not present

## 2019-04-13 DIAGNOSIS — Z48815 Encounter for surgical aftercare following surgery on the digestive system: Secondary | ICD-10-CM | POA: Diagnosis not present

## 2019-04-13 DIAGNOSIS — K9161 Intraoperative hemorrhage and hematoma of a digestive system organ or structure complicating a digestive sytem procedure: Secondary | ICD-10-CM | POA: Diagnosis not present

## 2019-04-20 DIAGNOSIS — Z48815 Encounter for surgical aftercare following surgery on the digestive system: Secondary | ICD-10-CM | POA: Diagnosis not present

## 2019-04-20 DIAGNOSIS — K9161 Intraoperative hemorrhage and hematoma of a digestive system organ or structure complicating a digestive sytem procedure: Secondary | ICD-10-CM | POA: Diagnosis not present

## 2019-04-20 DIAGNOSIS — J189 Pneumonia, unspecified organism: Secondary | ICD-10-CM | POA: Diagnosis not present

## 2019-04-20 DIAGNOSIS — E669 Obesity, unspecified: Secondary | ICD-10-CM | POA: Diagnosis not present

## 2019-04-20 DIAGNOSIS — D860 Sarcoidosis of lung: Secondary | ICD-10-CM | POA: Diagnosis not present

## 2019-04-20 DIAGNOSIS — Z9884 Bariatric surgery status: Secondary | ICD-10-CM | POA: Diagnosis not present

## 2019-04-28 DIAGNOSIS — Z48815 Encounter for surgical aftercare following surgery on the digestive system: Secondary | ICD-10-CM | POA: Diagnosis not present

## 2019-04-28 DIAGNOSIS — K9161 Intraoperative hemorrhage and hematoma of a digestive system organ or structure complicating a digestive sytem procedure: Secondary | ICD-10-CM | POA: Diagnosis not present

## 2019-04-28 DIAGNOSIS — D860 Sarcoidosis of lung: Secondary | ICD-10-CM | POA: Diagnosis not present

## 2019-04-28 DIAGNOSIS — Z9884 Bariatric surgery status: Secondary | ICD-10-CM | POA: Diagnosis not present

## 2019-04-28 DIAGNOSIS — J189 Pneumonia, unspecified organism: Secondary | ICD-10-CM | POA: Diagnosis not present

## 2019-04-28 DIAGNOSIS — E669 Obesity, unspecified: Secondary | ICD-10-CM | POA: Diagnosis not present

## 2019-05-02 DIAGNOSIS — K9161 Intraoperative hemorrhage and hematoma of a digestive system organ or structure complicating a digestive sytem procedure: Secondary | ICD-10-CM | POA: Diagnosis not present

## 2019-05-02 DIAGNOSIS — I10 Essential (primary) hypertension: Secondary | ICD-10-CM | POA: Diagnosis not present

## 2019-05-02 DIAGNOSIS — Z4800 Encounter for change or removal of nonsurgical wound dressing: Secondary | ICD-10-CM | POA: Diagnosis not present

## 2019-05-02 DIAGNOSIS — D860 Sarcoidosis of lung: Secondary | ICD-10-CM | POA: Diagnosis not present

## 2019-05-02 DIAGNOSIS — Z48815 Encounter for surgical aftercare following surgery on the digestive system: Secondary | ICD-10-CM | POA: Diagnosis not present

## 2019-05-02 DIAGNOSIS — Z9884 Bariatric surgery status: Secondary | ICD-10-CM | POA: Diagnosis not present

## 2019-05-02 DIAGNOSIS — F341 Dysthymic disorder: Secondary | ICD-10-CM | POA: Diagnosis not present

## 2019-05-02 DIAGNOSIS — E669 Obesity, unspecified: Secondary | ICD-10-CM | POA: Diagnosis not present

## 2019-05-02 DIAGNOSIS — Z79891 Long term (current) use of opiate analgesic: Secondary | ICD-10-CM | POA: Diagnosis not present

## 2019-05-02 DIAGNOSIS — J189 Pneumonia, unspecified organism: Secondary | ICD-10-CM | POA: Diagnosis not present

## 2019-05-02 DIAGNOSIS — K219 Gastro-esophageal reflux disease without esophagitis: Secondary | ICD-10-CM | POA: Diagnosis not present

## 2019-05-05 DIAGNOSIS — J189 Pneumonia, unspecified organism: Secondary | ICD-10-CM | POA: Diagnosis not present

## 2019-05-05 DIAGNOSIS — Z9884 Bariatric surgery status: Secondary | ICD-10-CM | POA: Diagnosis not present

## 2019-05-05 DIAGNOSIS — E669 Obesity, unspecified: Secondary | ICD-10-CM | POA: Diagnosis not present

## 2019-05-05 DIAGNOSIS — K9161 Intraoperative hemorrhage and hematoma of a digestive system organ or structure complicating a digestive sytem procedure: Secondary | ICD-10-CM | POA: Diagnosis not present

## 2019-05-05 DIAGNOSIS — Z48815 Encounter for surgical aftercare following surgery on the digestive system: Secondary | ICD-10-CM | POA: Diagnosis not present

## 2019-05-05 DIAGNOSIS — Z4800 Encounter for change or removal of nonsurgical wound dressing: Secondary | ICD-10-CM | POA: Diagnosis not present

## 2019-05-07 ENCOUNTER — Other Ambulatory Visit: Payer: Self-pay | Admitting: General Surgery

## 2019-05-11 ENCOUNTER — Other Ambulatory Visit: Payer: Self-pay | Admitting: General Surgery

## 2019-05-11 DIAGNOSIS — K432 Incisional hernia without obstruction or gangrene: Secondary | ICD-10-CM

## 2019-05-21 ENCOUNTER — Ambulatory Visit
Admission: RE | Admit: 2019-05-21 | Discharge: 2019-05-21 | Disposition: A | Discharge status: Home / Self Care | Payer: Medicare Other | Source: Ambulatory Visit | Attending: General Surgery | Admitting: General Surgery

## 2019-05-21 ENCOUNTER — Other Ambulatory Visit: Payer: Self-pay

## 2019-05-21 DIAGNOSIS — K432 Incisional hernia without obstruction or gangrene: Secondary | ICD-10-CM

## 2019-05-21 MED ORDER — IOPAMIDOL (ISOVUE-300) INJECTION 61%
100.0000 mL | Freq: Once | INTRAVENOUS | Status: AC | PRN
  Administered 2019-05-21: 11:00:00 100 mL via INTRAVENOUS

## 2019-05-25 DIAGNOSIS — Z23 Encounter for immunization: Secondary | ICD-10-CM | POA: Diagnosis not present

## 2019-05-25 DIAGNOSIS — G894 Chronic pain syndrome: Secondary | ICD-10-CM | POA: Diagnosis not present

## 2019-05-25 DIAGNOSIS — I10 Essential (primary) hypertension: Secondary | ICD-10-CM | POA: Diagnosis not present

## 2019-05-25 DIAGNOSIS — Z9884 Bariatric surgery status: Secondary | ICD-10-CM | POA: Diagnosis not present

## 2019-05-25 DIAGNOSIS — M1990 Unspecified osteoarthritis, unspecified site: Secondary | ICD-10-CM | POA: Diagnosis not present

## 2019-06-01 DIAGNOSIS — Z9884 Bariatric surgery status: Secondary | ICD-10-CM | POA: Diagnosis not present

## 2019-06-01 DIAGNOSIS — D860 Sarcoidosis of lung: Secondary | ICD-10-CM | POA: Diagnosis not present

## 2019-06-01 DIAGNOSIS — K219 Gastro-esophageal reflux disease without esophagitis: Secondary | ICD-10-CM | POA: Diagnosis not present

## 2019-06-01 DIAGNOSIS — J189 Pneumonia, unspecified organism: Secondary | ICD-10-CM | POA: Diagnosis not present

## 2019-06-01 DIAGNOSIS — Z79891 Long term (current) use of opiate analgesic: Secondary | ICD-10-CM | POA: Diagnosis not present

## 2019-06-01 DIAGNOSIS — I10 Essential (primary) hypertension: Secondary | ICD-10-CM | POA: Diagnosis not present

## 2019-06-01 DIAGNOSIS — E669 Obesity, unspecified: Secondary | ICD-10-CM | POA: Diagnosis not present

## 2019-06-01 DIAGNOSIS — Z4800 Encounter for change or removal of nonsurgical wound dressing: Secondary | ICD-10-CM | POA: Diagnosis not present

## 2019-06-01 DIAGNOSIS — Z48815 Encounter for surgical aftercare following surgery on the digestive system: Secondary | ICD-10-CM | POA: Diagnosis not present

## 2019-06-01 DIAGNOSIS — K9161 Intraoperative hemorrhage and hematoma of a digestive system organ or structure complicating a digestive sytem procedure: Secondary | ICD-10-CM | POA: Diagnosis not present

## 2019-06-01 DIAGNOSIS — F341 Dysthymic disorder: Secondary | ICD-10-CM | POA: Diagnosis not present

## 2019-06-03 DIAGNOSIS — E669 Obesity, unspecified: Secondary | ICD-10-CM | POA: Diagnosis not present

## 2019-06-03 DIAGNOSIS — K9161 Intraoperative hemorrhage and hematoma of a digestive system organ or structure complicating a digestive sytem procedure: Secondary | ICD-10-CM | POA: Diagnosis not present

## 2019-06-03 DIAGNOSIS — Z4800 Encounter for change or removal of nonsurgical wound dressing: Secondary | ICD-10-CM | POA: Diagnosis not present

## 2019-06-03 DIAGNOSIS — Z9884 Bariatric surgery status: Secondary | ICD-10-CM | POA: Diagnosis not present

## 2019-06-03 DIAGNOSIS — Z48815 Encounter for surgical aftercare following surgery on the digestive system: Secondary | ICD-10-CM | POA: Diagnosis not present

## 2019-06-03 DIAGNOSIS — J189 Pneumonia, unspecified organism: Secondary | ICD-10-CM | POA: Diagnosis not present

## 2019-06-04 ENCOUNTER — Ambulatory Visit: Payer: Self-pay | Admitting: General Surgery

## 2019-06-08 DIAGNOSIS — Z79899 Other long term (current) drug therapy: Secondary | ICD-10-CM | POA: Diagnosis not present

## 2019-06-08 DIAGNOSIS — M129 Arthropathy, unspecified: Secondary | ICD-10-CM | POA: Diagnosis not present

## 2019-06-08 DIAGNOSIS — E559 Vitamin D deficiency, unspecified: Secondary | ICD-10-CM | POA: Diagnosis not present

## 2019-06-08 DIAGNOSIS — D869 Sarcoidosis, unspecified: Secondary | ICD-10-CM | POA: Diagnosis not present

## 2019-06-08 DIAGNOSIS — G894 Chronic pain syndrome: Secondary | ICD-10-CM | POA: Diagnosis not present

## 2019-06-10 DIAGNOSIS — Z9884 Bariatric surgery status: Secondary | ICD-10-CM | POA: Diagnosis not present

## 2019-06-10 DIAGNOSIS — Z48815 Encounter for surgical aftercare following surgery on the digestive system: Secondary | ICD-10-CM | POA: Diagnosis not present

## 2019-06-10 DIAGNOSIS — E669 Obesity, unspecified: Secondary | ICD-10-CM | POA: Diagnosis not present

## 2019-06-10 DIAGNOSIS — K9161 Intraoperative hemorrhage and hematoma of a digestive system organ or structure complicating a digestive sytem procedure: Secondary | ICD-10-CM | POA: Diagnosis not present

## 2019-06-10 DIAGNOSIS — Z4800 Encounter for change or removal of nonsurgical wound dressing: Secondary | ICD-10-CM | POA: Diagnosis not present

## 2019-06-10 DIAGNOSIS — J189 Pneumonia, unspecified organism: Secondary | ICD-10-CM | POA: Diagnosis not present

## 2019-06-15 DIAGNOSIS — Z48815 Encounter for surgical aftercare following surgery on the digestive system: Secondary | ICD-10-CM | POA: Diagnosis not present

## 2019-06-15 DIAGNOSIS — Z9884 Bariatric surgery status: Secondary | ICD-10-CM | POA: Diagnosis not present

## 2019-06-15 DIAGNOSIS — J189 Pneumonia, unspecified organism: Secondary | ICD-10-CM | POA: Diagnosis not present

## 2019-06-15 DIAGNOSIS — E669 Obesity, unspecified: Secondary | ICD-10-CM | POA: Diagnosis not present

## 2019-06-15 DIAGNOSIS — Z4800 Encounter for change or removal of nonsurgical wound dressing: Secondary | ICD-10-CM | POA: Diagnosis not present

## 2019-06-15 DIAGNOSIS — K9161 Intraoperative hemorrhage and hematoma of a digestive system organ or structure complicating a digestive sytem procedure: Secondary | ICD-10-CM | POA: Diagnosis not present

## 2019-06-24 DIAGNOSIS — K9161 Intraoperative hemorrhage and hematoma of a digestive system organ or structure complicating a digestive sytem procedure: Secondary | ICD-10-CM | POA: Diagnosis not present

## 2019-06-24 DIAGNOSIS — E669 Obesity, unspecified: Secondary | ICD-10-CM | POA: Diagnosis not present

## 2019-06-24 DIAGNOSIS — Z4800 Encounter for change or removal of nonsurgical wound dressing: Secondary | ICD-10-CM | POA: Diagnosis not present

## 2019-06-24 DIAGNOSIS — Z48815 Encounter for surgical aftercare following surgery on the digestive system: Secondary | ICD-10-CM | POA: Diagnosis not present

## 2019-06-24 DIAGNOSIS — Z9884 Bariatric surgery status: Secondary | ICD-10-CM | POA: Diagnosis not present

## 2019-06-24 DIAGNOSIS — J189 Pneumonia, unspecified organism: Secondary | ICD-10-CM | POA: Diagnosis not present

## 2019-06-25 NOTE — Pre-Procedure Instructions (Addendum)
[place], [address] [place] [address] Phone: [phone] Fax: [phone]  [name] [place], [place] - [address] [place] [address] Phone: [phone] Fax: [phone]      Your procedure is scheduled on 07-01-19   Report to [facility] Main Entrance "A" at 0530A.M., and check in at the Admitting office.  Call this number if you have problems the morning of surgery:  [phone]  Call [phone] if you have any questions prior to your surgery date Monday-Friday 8am-4pm    Remember:  Do not eat  after midnight the night before your surgery  You may drink clear liquids until 0430 AM  the morning of your surgery.   Clear liquids allowed are: Water, Non-Citrus Juices (without pulp), Carbonated Beverages, Clear Tea, Black Coffee Only, and Gatorade  Please complete your PRE-SURGERY ENSURE that was provided to you by 0430 am the morning of surgery.  Please, if able, drink it in one setting. DO NOT SIP.   Take these medicines the morning of surgery with A SIP OF WATER : ALPRAZolam (XANAX)as needed amLODipine (NORVASC)  escitalopram (LEXAPRO)  gabapentin (NEURONTIN) HYDROcodone-acetaminophen (NORCO)as needed LINZESS as needed  7 days prior to surgery STOP taking any Aspirin (unless otherwise instructed by your surgeon), Aleve, Naproxen, Ibuprofen, Motrin, Advil, Goody's, BC's, all herbal medications, fish oil, and all vitamins.    The Morning of Surgery  Do not wear jewelry, make-up or nail polish.  Do not wear lotions, powders, or perfumes/colognes, or deodorant  Do not shave 48 hours prior to surgery.    Do not bring valuables to the hospital.  [facility] is not responsible for any belongings or valuables.  If you are a smoker, DO NOT Smoke 24 hours prior to surgery IF you wear a CPAP at night please bring your mask, tubing, and machine the morning of  surgery   Remember that you must have someone to transport you home after your surgery, and remain with you for 24 hours if you are discharged the same day.   Contacts, glasses, hearing aids, dentures or bridgework may not be worn into surgery.    Leave your suitcase in the car.  After surgery it may be brought to your room.  For patients admitted to the hospital, discharge time will be determined by your treatment team.  Patients discharged the day of surgery will not be allowed to drive home.    Special instructions:   [place]- Preparing For Surgery  Before surgery, you can play an important role. Because skin is not sterile, your skin needs to be as free of germs as possible. You can reduce the number of germs on your skin by washing with CHG (chlorahexidine gluconate) Soap before surgery.  CHG is an antiseptic cleaner which kills germs and bonds with the skin to continue killing germs even after washing.    Oral Hygiene is also important to reduce your risk of infection.  Remember - BRUSH YOUR TEETH THE MORNING OF SURGERY WITH YOUR REGULAR TOOTHPASTE  Please do not use if you have an allergy to CHG or antibacterial soaps. If your skin becomes reddened/irritated stop using the CHG.  Do not shave (including legs and underarms) for at least 48 hours prior to first CHG shower. It is OK to shave your face.  Please follow these instructions carefully.   1. Shower the [employer] BEFORE SURGERY  and the MORNING OF SURGERY with CHG Soap.   2. If you chose to wash your hair, wash your hair first as usual with your normal shampoo.  3. After you shampoo, rinse your hair and body thoroughly to remove the shampoo.  4. Use CHG as you would any other liquid soap. You can apply CHG directly to the skin and wash gently with a scrungie or a clean washcloth.   5. Apply the CHG Soap to your body ONLY FROM THE NECK DOWN.  Do not use on open wounds or open sores. Avoid contact with your eyes, ears,  mouth and genitals (private parts). Wash Face and genitals (private parts)  with your normal soap.   6. Wash thoroughly, paying special attention to the area where your surgery will be performed.  7. Thoroughly rinse your body with warm water from the neck down.  8. DO NOT shower/wash with your normal soap after using and rinsing off the CHG Soap.  9. Pat yourself dry with a CLEAN TOWEL.  10. Wear CLEAN PAJAMAS to bed the night before surgery, wear comfortable clothes the morning of surgery  11. Place CLEAN SHEETS on your bed the night of your first shower and DO NOT SLEEP WITH PETS.    Day of Surgery:  Do not apply any deodorants/lotions. Please shower the morning of surgery with the CHG soap  Please wear clean clothes to the hospital/surgery center.   Remember to brush your teeth WITH YOUR REGULAR TOOTHPASTE.   Please read over the  fact sheets that you were given.

## 2019-06-28 ENCOUNTER — Encounter (HOSPITAL_COMMUNITY): Payer: Self-pay

## 2019-06-28 ENCOUNTER — Other Ambulatory Visit: Payer: Self-pay

## 2019-06-28 ENCOUNTER — Other Ambulatory Visit (HOSPITAL_COMMUNITY)
Admission: RE | Admit: 2019-06-28 | Discharge: 2019-06-28 | Disposition: A | Discharge status: Home / Self Care | Payer: Medicare Other | Source: Ambulatory Visit

## 2019-06-28 ENCOUNTER — Encounter (HOSPITAL_COMMUNITY)
Admission: RE | Admit: 2019-06-28 | Discharge: 2019-06-28 | Disposition: A | Discharge status: Home / Self Care | Payer: Medicare Other | Source: Ambulatory Visit | Attending: General Surgery | Admitting: General Surgery

## 2019-06-28 DIAGNOSIS — K432 Incisional hernia without obstruction or gangrene: Secondary | ICD-10-CM | POA: Insufficient documentation

## 2019-06-28 DIAGNOSIS — Z01812 Encounter for preprocedural laboratory examination: Secondary | ICD-10-CM | POA: Insufficient documentation

## 2019-06-28 HISTORY — DX: Depression, unspecified: F32.A

## 2019-06-28 HISTORY — DX: Unspecified osteoarthritis, unspecified site: M19.90

## 2019-06-28 LAB — BASIC METABOLIC PANEL
Anion gap: 12 (ref 5–15)
BUN: 5 mg/dL — ABNORMAL LOW (ref 6–20)
CO2: 25 mmol/L (ref 22–32)
Calcium: 9.6 mg/dL (ref 8.9–10.3)
Chloride: 102 mmol/L (ref 98–111)
Creatinine, Ser: 0.7 mg/dL (ref 0.44–1.00)
GFR calc Af Amer: >60 mL/min (ref >60)
GFR calc non Af Amer: >60 mL/min (ref >60)
Glucose, Bld: 100 mg/dL — ABNORMAL HIGH (ref 70–99)
Potassium: 4.1 mmol/L (ref 3.5–5.1)
Sodium: 139 mmol/L (ref 135–145)

## 2019-06-28 LAB — CBC WITH DIFFERENTIAL/PLATELET
Abs Immature Granulocytes: 0.02 10*3/uL (ref 0.00–0.07)
Basophils Absolute: 0 10*3/uL (ref 0.0–0.1)
Basophils Relative: 1 %
Eosinophils Absolute: 0.2 10*3/uL (ref 0.0–0.5)
Eosinophils Relative: 5 %
HCT: 38.9 % (ref 36.0–46.0)
Hemoglobin: 12.3 g/dL (ref 12.0–15.0)
Immature Granulocytes: 1 %
Lymphocytes Relative: 25 %
Lymphs Abs: 1.1 10*3/uL (ref 0.7–4.0)
MCH: 26.4 pg (ref 26.0–34.0)
MCHC: 31.6 g/dL (ref 30.0–36.0)
MCV: 83.5 fL (ref 80.0–100.0)
Monocytes Absolute: 0.4 10*3/uL (ref 0.1–1.0)
Monocytes Relative: 10 %
Neutro Abs: 2.6 10*3/uL (ref 1.7–7.7)
Neutrophils Relative %: 58 %
Platelets: 288 10*3/uL (ref 150–400)
RBC: 4.66 MIL/uL (ref 3.87–5.11)
RDW: 15.1 % (ref 11.5–15.5)
WBC: 4.4 10*3/uL (ref 4.0–10.5)
nRBC: 0 % (ref 0.0–0.2)

## 2019-06-28 NOTE — Progress Notes (Signed)
PCP - Luan Pulling Cardiologist - n/a  PPM/ICD - n/a Device Orders -  Rep Notified -   Chest x-ray - 2V 08/2018 EKG - 6/20 Stress Test - 2010 ECHO -2010  Cardiac Cath -   Sleep Study - n/a CPAP -   Fasting Blood Sugar - denies Checks Blood Sugar _____ times a day  Blood Thinner Instructions:n/a Aspirin Instructions:n/a  ERAS Protcol -pt. Instructed per MD orders PRE-SURGERY Ensure or G2-   COVID TEST- done today   Anesthesia review: n/a  Patient denies shortness of breath, fever, cough and chest pain at PAT appointment   All instructions explained to the patient, with a verbal understanding of the material. Patient agrees to go over the instructions while at home for a better understanding. Patient also instructed to self quarantine after being tested for COVID-19. The opportunity to ask questions was provided.

## 2019-06-29 DIAGNOSIS — E669 Obesity, unspecified: Secondary | ICD-10-CM | POA: Diagnosis not present

## 2019-06-29 DIAGNOSIS — Z9884 Bariatric surgery status: Secondary | ICD-10-CM | POA: Diagnosis not present

## 2019-06-29 DIAGNOSIS — J189 Pneumonia, unspecified organism: Secondary | ICD-10-CM | POA: Diagnosis not present

## 2019-06-29 DIAGNOSIS — Z48815 Encounter for surgical aftercare following surgery on the digestive system: Secondary | ICD-10-CM | POA: Diagnosis not present

## 2019-06-29 DIAGNOSIS — Z4800 Encounter for change or removal of nonsurgical wound dressing: Secondary | ICD-10-CM | POA: Diagnosis not present

## 2019-06-29 DIAGNOSIS — K9161 Intraoperative hemorrhage and hematoma of a digestive system organ or structure complicating a digestive sytem procedure: Secondary | ICD-10-CM | POA: Diagnosis not present

## 2019-06-29 LAB — NOVEL CORONAVIRUS, NAA (HOSP ORDER, SEND-OUT TO REF LAB; TAT 18-24 HRS): SARS-CoV-2, NAA: NOT DETECTED

## 2019-06-30 NOTE — Anesthesia Preprocedure Evaluation (Addendum)
Anesthesia Evaluation  Patient identified by MRN, date of birth, ID band Patient awake    Reviewed: Allergy & Precautions, NPO status , Patient's Chart, lab work & pertinent test results  History of Anesthesia Complications (+) PONV and history of anesthetic complications  Airway Mallampati: II  TM Distance: >3 FB Neck ROM: Full    Dental  (+) Edentulous Upper, Edentulous Lower   Pulmonary former smoker,   Pulmonary sarcoidosis    Pulmonary exam normal        Cardiovascular hypertension, Pt. on medications (-) anginaNormal cardiovascular exam     Neuro/Psych PSYCHIATRIC DISORDERS Depression negative neurological ROS     GI/Hepatic Neg liver ROS, GERD  Controlled,  Endo/Other   Obesity   Renal/GU negative Renal ROS     Musculoskeletal  (+) Arthritis ,   Abdominal   Peds  Hematology negative hematology ROS (+)   Anesthesia Other Findings   Reproductive/Obstetrics                            Anesthesia Physical Anesthesia Plan  ASA: II  Anesthesia Plan: General   Post-op Pain Management:    Induction: Intravenous  PONV Risk Score and Plan: 4 or greater and Treatment may vary due to age or medical condition, Ondansetron and Dexamethasone  Airway Management Planned: Oral ETT  Additional Equipment: None  Intra-op Plan:   Post-operative Plan: Extubation in OR  Informed Consent: I have reviewed the patients History and Physical, chart, labs and discussed the procedure including the risks, benefits and alternatives for the proposed anesthesia with the patient or authorized representative who has indicated his/her understanding and acceptance.     Dental advisory given  Plan Discussed with: CRNA and Anesthesiologist  Anesthesia Plan Comments:        Anesthesia Quick Evaluation

## 2019-07-01 ENCOUNTER — Inpatient Hospital Stay (HOSPITAL_COMMUNITY): Payer: Medicare Other | Admitting: Anesthesiology

## 2019-07-01 ENCOUNTER — Encounter (HOSPITAL_COMMUNITY): Payer: Self-pay

## 2019-07-01 ENCOUNTER — Encounter (HOSPITAL_COMMUNITY)
Admission: RE | Disposition: A | Discharge status: Home / Self Care | Payer: Self-pay | Source: Home / Self Care | Attending: General Surgery

## 2019-07-01 ENCOUNTER — Inpatient Hospital Stay (HOSPITAL_COMMUNITY)
Admission: RE | Admit: 2019-07-01 | Discharge: 2019-07-06 | DRG: 355 | Disposition: A | Discharge status: Home / Self Care | Payer: Medicare Other | Attending: General Surgery | Admitting: General Surgery

## 2019-07-01 ENCOUNTER — Other Ambulatory Visit: Payer: Self-pay

## 2019-07-01 DIAGNOSIS — Z87891 Personal history of nicotine dependence: Secondary | ICD-10-CM | POA: Diagnosis not present

## 2019-07-01 DIAGNOSIS — Z79891 Long term (current) use of opiate analgesic: Secondary | ICD-10-CM | POA: Diagnosis not present

## 2019-07-01 DIAGNOSIS — E7800 Pure hypercholesterolemia, unspecified: Secondary | ICD-10-CM | POA: Diagnosis present

## 2019-07-01 DIAGNOSIS — K432 Incisional hernia without obstruction or gangrene: Secondary | ICD-10-CM | POA: Diagnosis present

## 2019-07-01 DIAGNOSIS — K660 Peritoneal adhesions (postprocedural) (postinfection): Secondary | ICD-10-CM | POA: Diagnosis not present

## 2019-07-01 DIAGNOSIS — Z20828 Contact with and (suspected) exposure to other viral communicable diseases: Secondary | ICD-10-CM | POA: Diagnosis present

## 2019-07-01 DIAGNOSIS — D860 Sarcoidosis of lung: Secondary | ICD-10-CM | POA: Diagnosis present

## 2019-07-01 DIAGNOSIS — I10 Essential (primary) hypertension: Secondary | ICD-10-CM | POA: Diagnosis present

## 2019-07-01 DIAGNOSIS — M1990 Unspecified osteoarthritis, unspecified site: Secondary | ICD-10-CM | POA: Diagnosis present

## 2019-07-01 DIAGNOSIS — Z79899 Other long term (current) drug therapy: Secondary | ICD-10-CM | POA: Diagnosis not present

## 2019-07-01 DIAGNOSIS — F329 Major depressive disorder, single episode, unspecified: Secondary | ICD-10-CM | POA: Diagnosis present

## 2019-07-01 DIAGNOSIS — K219 Gastro-esophageal reflux disease without esophagitis: Secondary | ICD-10-CM | POA: Diagnosis present

## 2019-07-01 DIAGNOSIS — J189 Pneumonia, unspecified organism: Secondary | ICD-10-CM | POA: Diagnosis not present

## 2019-07-01 DIAGNOSIS — Z8249 Family history of ischemic heart disease and other diseases of the circulatory system: Secondary | ICD-10-CM | POA: Diagnosis not present

## 2019-07-01 DIAGNOSIS — Z886 Allergy status to analgesic agent status: Secondary | ICD-10-CM

## 2019-07-01 HISTORY — PX: INCISIONAL HERNIA REPAIR: SHX193

## 2019-07-01 HISTORY — PX: INSERTION OF MESH: SHX5868

## 2019-07-01 HISTORY — PX: LAPAROSCOPY: SHX197

## 2019-07-01 LAB — CBC
HCT: 39.9 % (ref 36.0–46.0)
Hemoglobin: 12.8 g/dL (ref 12.0–15.0)
MCH: 26.3 pg (ref 26.0–34.0)
MCHC: 32.1 g/dL (ref 30.0–36.0)
MCV: 82.1 fL (ref 80.0–100.0)
Platelets: 302 10*3/uL (ref 150–400)
RBC: 4.86 MIL/uL (ref 3.87–5.11)
RDW: 15 % (ref 11.5–15.5)
WBC: 12 10*3/uL — ABNORMAL HIGH (ref 4.0–10.5)
nRBC: 0 % (ref 0.0–0.2)

## 2019-07-01 LAB — CREATININE, SERUM
Creatinine, Ser: 0.71 mg/dL (ref 0.44–1.00)
GFR calc Af Amer: >60 mL/min (ref >60)
GFR calc non Af Amer: >60 mL/min (ref >60)

## 2019-07-01 SURGERY — REPAIR, HERNIA, INCISIONAL, LAPAROSCOPIC
Anesthesia: General | Site: Abdomen

## 2019-07-01 MED ORDER — BUPIVACAINE-EPINEPHRINE (PF) 0.5% -1:200000 IJ SOLN
INTRAMUSCULAR | Status: DC | PRN
  Administered 2019-07-01: 6 mL

## 2019-07-01 MED ORDER — MORPHINE SULFATE (PF) 2 MG/ML IV SOLN
2.0000 mg | INTRAVENOUS | Status: DC | PRN
  Administered 2019-07-01 – 2019-07-05 (×16): 2 mg via INTRAVENOUS
  Filled 2019-07-01 (×17): qty 1

## 2019-07-01 MED ORDER — BUPIVACAINE HCL (PF) 0.25 % IJ SOLN
INTRAMUSCULAR | Status: AC
  Filled 2019-07-01: qty 30

## 2019-07-01 MED ORDER — DEXTROSE-NACL 5-0.45 % IV SOLN
INTRAVENOUS | Status: DC
  Administered 2019-07-01 – 2019-07-02 (×2): via INTRAVENOUS

## 2019-07-01 MED ORDER — ROSUVASTATIN CALCIUM 5 MG PO TABS
5.0000 mg | ORAL_TABLET | Freq: Every day | ORAL | Status: DC
  Administered 2019-07-02 – 2019-07-06 (×5): 5 mg via ORAL
  Filled 2019-07-01 (×5): qty 1

## 2019-07-01 MED ORDER — ONDANSETRON 4 MG PO TBDP: 4.0000 mg | ORAL_TABLET | Freq: Four times a day (QID) | ORAL | Status: DC | PRN

## 2019-07-01 MED ORDER — MIDAZOLAM HCL 5 MG/5ML IJ SOLN
INTRAMUSCULAR | Status: DC | PRN
  Administered 2019-07-01: 2 mg via INTRAVENOUS

## 2019-07-01 MED ORDER — FENTANYL CITRATE (PF) 100 MCG/2ML IJ SOLN
INTRAMUSCULAR | Status: DC | PRN
  Administered 2019-07-01 (×5): 50 ug via INTRAVENOUS

## 2019-07-01 MED ORDER — ROCURONIUM BROMIDE 10 MG/ML (PF) SYRINGE
PREFILLED_SYRINGE | INTRAVENOUS | Status: AC
  Filled 2019-07-01: qty 20

## 2019-07-01 MED ORDER — EPHEDRINE 5 MG/ML INJ
INTRAVENOUS | Status: AC
  Filled 2019-07-01: qty 10

## 2019-07-01 MED ORDER — KETOROLAC TROMETHAMINE 30 MG/ML IJ SOLN
30.0000 mg | Freq: Four times a day (QID) | INTRAMUSCULAR | Status: DC | PRN
  Administered 2019-07-01 – 2019-07-06 (×14): 30 mg via INTRAVENOUS
  Filled 2019-07-01 (×15): qty 1

## 2019-07-01 MED ORDER — HYDROMORPHONE HCL 1 MG/ML IJ SOLN
INTRAMUSCULAR | Status: AC
  Filled 2019-07-01: qty 1

## 2019-07-01 MED ORDER — PROMETHAZINE HCL 25 MG/ML IJ SOLN: 6.2500 mg | INTRAMUSCULAR | Status: DC | PRN

## 2019-07-01 MED ORDER — MIDAZOLAM HCL 2 MG/2ML IJ SOLN
INTRAMUSCULAR | Status: AC
  Filled 2019-07-01: qty 2

## 2019-07-01 MED ORDER — ENOXAPARIN SODIUM 40 MG/0.4ML ~~LOC~~ SOLN
40.0000 mg | SUBCUTANEOUS | Status: DC
  Administered 2019-07-02 – 2019-07-06 (×5): 40 mg via SUBCUTANEOUS
  Filled 2019-07-01 (×5): qty 0.4

## 2019-07-01 MED ORDER — LACTATED RINGERS IV SOLN
INTRAVENOUS | Status: DC | PRN
  Administered 2019-07-01 (×2): via INTRAVENOUS

## 2019-07-01 MED ORDER — POLYETHYLENE GLYCOL 3350 17 G PO PACK
17.0000 g | PACK | Freq: Every day | ORAL | Status: DC | PRN
  Administered 2019-07-06: 17 g via ORAL
  Filled 2019-07-01: qty 1

## 2019-07-01 MED ORDER — DIPHENHYDRAMINE HCL 25 MG PO CAPS: 25.0000 mg | ORAL_CAPSULE | Freq: Four times a day (QID) | ORAL | Status: DC | PRN

## 2019-07-01 MED ORDER — ALPRAZOLAM 0.5 MG PO TABS
0.5000 mg | ORAL_TABLET | Freq: Two times a day (BID) | ORAL | Status: DC | PRN
  Administered 2019-07-03 – 2019-07-05 (×3): 0.5 mg via ORAL
  Filled 2019-07-01 (×3): qty 1

## 2019-07-01 MED ORDER — FENTANYL CITRATE (PF) 100 MCG/2ML IJ SOLN
INTRAMUSCULAR | Status: AC
  Filled 2019-07-01: qty 2

## 2019-07-01 MED ORDER — OXYCODONE HCL 5 MG/5ML PO SOLN: 5.0000 mg | Freq: Once | ORAL | Status: DC | PRN

## 2019-07-01 MED ORDER — EPHEDRINE SULFATE 50 MG/ML IJ SOLN
INTRAMUSCULAR | Status: DC | PRN
  Administered 2019-07-01: 10 mg via INTRAVENOUS

## 2019-07-01 MED ORDER — LIDOCAINE HCL (CARDIAC) PF 100 MG/5ML IV SOSY
PREFILLED_SYRINGE | INTRAVENOUS | Status: DC | PRN
  Administered 2019-07-01: 80 mg via INTRAVENOUS

## 2019-07-01 MED ORDER — ONDANSETRON HCL 4 MG/2ML IJ SOLN: 4.0000 mg | Freq: Four times a day (QID) | INTRAMUSCULAR | Status: DC | PRN

## 2019-07-01 MED ORDER — ROCURONIUM BROMIDE 100 MG/10ML IV SOLN
INTRAVENOUS | Status: DC | PRN
  Administered 2019-07-01: 90 mg via INTRAVENOUS
  Administered 2019-07-01: 40 mg via INTRAVENOUS

## 2019-07-01 MED ORDER — OXYCODONE HCL 5 MG PO TABS: 5.0000 mg | ORAL_TABLET | Freq: Once | ORAL | Status: DC | PRN

## 2019-07-01 MED ORDER — DEXAMETHASONE SODIUM PHOSPHATE 10 MG/ML IJ SOLN
INTRAMUSCULAR | Status: AC
  Filled 2019-07-01: qty 1

## 2019-07-01 MED ORDER — GABAPENTIN 300 MG PO CAPS
300.0000 mg | ORAL_CAPSULE | ORAL | Status: AC
  Administered 2019-07-01: 300 mg via ORAL
  Filled 2019-07-01: qty 1

## 2019-07-01 MED ORDER — LIDOCAINE 2% (20 MG/ML) 5 ML SYRINGE
INTRAMUSCULAR | Status: AC
  Filled 2019-07-01: qty 5

## 2019-07-01 MED ORDER — CHLORHEXIDINE GLUCONATE CLOTH 2 % EX PADS: 6.0000 | MEDICATED_PAD | Freq: Once | CUTANEOUS | Status: DC

## 2019-07-01 MED ORDER — DIPHENHYDRAMINE HCL 50 MG/ML IJ SOLN: 25.0000 mg | Freq: Four times a day (QID) | INTRAMUSCULAR | Status: DC | PRN

## 2019-07-01 MED ORDER — ACETAMINOPHEN 500 MG PO TABS
1000.0000 mg | ORAL_TABLET | Freq: Four times a day (QID) | ORAL | Status: DC
  Administered 2019-07-01 – 2019-07-06 (×16): 1000 mg via ORAL
  Filled 2019-07-01 (×18): qty 2

## 2019-07-01 MED ORDER — PROPOFOL 10 MG/ML IV BOLUS
INTRAVENOUS | Status: DC | PRN
  Administered 2019-07-01: 150 mg via INTRAVENOUS

## 2019-07-01 MED ORDER — WHITE PETROLATUM EX OINT
TOPICAL_OINTMENT | CUTANEOUS | Status: AC
  Administered 2019-07-01: 15:00:00
  Filled 2019-07-01: qty 28.35

## 2019-07-01 MED ORDER — AMLODIPINE BESYLATE 5 MG PO TABS
5.0000 mg | ORAL_TABLET | Freq: Every day | ORAL | Status: DC
  Administered 2019-07-01 – 2019-07-06 (×6): 5 mg via ORAL
  Filled 2019-07-01 (×6): qty 1

## 2019-07-01 MED ORDER — FENTANYL CITRATE (PF) 250 MCG/5ML IJ SOLN
INTRAMUSCULAR | Status: AC
  Filled 2019-07-01: qty 5

## 2019-07-01 MED ORDER — DEXAMETHASONE SODIUM PHOSPHATE 10 MG/ML IJ SOLN
INTRAMUSCULAR | Status: DC | PRN
  Administered 2019-07-01: 10 mg via INTRAVENOUS

## 2019-07-01 MED ORDER — ONDANSETRON HCL 4 MG/2ML IJ SOLN
INTRAMUSCULAR | Status: AC
  Filled 2019-07-01: qty 2

## 2019-07-01 MED ORDER — FENTANYL CITRATE (PF) 100 MCG/2ML IJ SOLN
25.0000 ug | INTRAMUSCULAR | Status: DC | PRN
  Administered 2019-07-01 (×4): 50 ug via INTRAVENOUS

## 2019-07-01 MED ORDER — CEFAZOLIN SODIUM-DEXTROSE 2-4 GM/100ML-% IV SOLN
2.0000 g | INTRAVENOUS | Status: AC
  Administered 2019-07-01: 2 g via INTRAVENOUS
  Filled 2019-07-01: qty 100

## 2019-07-01 MED ORDER — BUPIVACAINE-EPINEPHRINE 0.5% -1:200000 IJ SOLN
INTRAMUSCULAR | Status: AC
  Filled 2019-07-01: qty 1

## 2019-07-01 MED ORDER — HYDROMORPHONE HCL 1 MG/ML IJ SOLN
0.2500 mg | INTRAMUSCULAR | Status: DC | PRN
  Administered 2019-07-01 (×2): 0.5 mg via INTRAVENOUS

## 2019-07-01 MED ORDER — PHENYLEPHRINE HCL (PRESSORS) 10 MG/ML IV SOLN
INTRAVENOUS | Status: DC | PRN
  Administered 2019-07-01: 80 ug via INTRAVENOUS
  Administered 2019-07-01 (×2): 40 ug via INTRAVENOUS
  Administered 2019-07-01: 80 ug via INTRAVENOUS
  Administered 2019-07-01: 40 ug via INTRAVENOUS

## 2019-07-01 MED ORDER — ONDANSETRON HCL 4 MG/2ML IJ SOLN
INTRAMUSCULAR | Status: DC | PRN
  Administered 2019-07-01: 4 mg via INTRAVENOUS

## 2019-07-01 MED ORDER — HYDROCODONE-ACETAMINOPHEN 5-325 MG PO TABS
1.0000 | ORAL_TABLET | ORAL | Status: DC | PRN
  Administered 2019-07-01 – 2019-07-02 (×3): 2 via ORAL
  Filled 2019-07-01 (×3): qty 2

## 2019-07-01 MED ORDER — PHENYLEPHRINE 40 MCG/ML (10ML) SYRINGE FOR IV PUSH (FOR BLOOD PRESSURE SUPPORT)
PREFILLED_SYRINGE | INTRAVENOUS | Status: AC
  Filled 2019-07-01: qty 10

## 2019-07-01 MED ORDER — PROPOFOL 10 MG/ML IV BOLUS
INTRAVENOUS | Status: AC
  Filled 2019-07-01: qty 20

## 2019-07-01 MED ORDER — 0.9 % SODIUM CHLORIDE (POUR BTL) OPTIME
TOPICAL | Status: DC | PRN
  Administered 2019-07-01: 1000 mL

## 2019-07-01 MED ORDER — SUGAMMADEX SODIUM 200 MG/2ML IV SOLN
INTRAVENOUS | Status: DC | PRN
  Administered 2019-07-01: 400 mg via INTRAVENOUS

## 2019-07-01 MED ORDER — HEPARIN SODIUM (PORCINE) 5000 UNIT/ML IJ SOLN
5000.0000 [IU] | Freq: Once | INTRAMUSCULAR | Status: AC
  Administered 2019-07-01: 5000 [IU] via SUBCUTANEOUS
  Filled 2019-07-01: qty 1

## 2019-07-01 MED ORDER — KETOROLAC TROMETHAMINE 15 MG/ML IJ SOLN
15.0000 mg | INTRAMUSCULAR | Status: DC
  Filled 2019-07-01: qty 1

## 2019-07-01 MED ORDER — ESCITALOPRAM OXALATE 10 MG PO TABS
5.0000 mg | ORAL_TABLET | Freq: Every day | ORAL | Status: DC
  Administered 2019-07-01 – 2019-07-06 (×6): 5 mg via ORAL
  Filled 2019-07-01 (×6): qty 1

## 2019-07-01 MED ORDER — GABAPENTIN 300 MG PO CAPS
300.0000 mg | ORAL_CAPSULE | Freq: Three times a day (TID) | ORAL | Status: DC
  Administered 2019-07-01 – 2019-07-06 (×15): 300 mg via ORAL
  Filled 2019-07-01 (×16): qty 1

## 2019-07-01 MED ORDER — GABAPENTIN 300 MG PO CAPS: 300.0000 mg | ORAL_CAPSULE | Freq: Two times a day (BID) | ORAL | Status: DC

## 2019-07-01 MED ORDER — ACETAMINOPHEN 500 MG PO TABS
1000.0000 mg | ORAL_TABLET | ORAL | Status: AC
  Administered 2019-07-01: 1000 mg via ORAL
  Filled 2019-07-01: qty 2

## 2019-07-01 MED ORDER — LINACLOTIDE 145 MCG PO CAPS
145.0000 ug | ORAL_CAPSULE | Freq: Every day | ORAL | Status: DC | PRN
  Filled 2019-07-01: qty 1

## 2019-07-01 SURGICAL SUPPLY — 60 items
ADH SKN CLS APL DERMABOND .7 (GAUZE/BANDAGES/DRESSINGS) ×1
APL PRP STRL LF DISP 70% ISPRP (MISCELLANEOUS) ×1
APPLIER CLIP 5 13 M/L LIGAMAX5 (MISCELLANEOUS)
APR CLP MED LRG 5 ANG JAW (MISCELLANEOUS)
BINDER ABDOMINAL 12 ML 46-62 (SOFTGOODS) ×2 IMPLANT
BIOPATCH RED 1 DISK 7.0 (GAUZE/BANDAGES/DRESSINGS) ×2 IMPLANT
BIOPATCH RED 1IN DISK 7.0MM (GAUZE/BANDAGES/DRESSINGS) ×2
BLADE CLIPPER SURG (BLADE) IMPLANT
BLADE SURG 10 STRL SS (BLADE) ×2 IMPLANT
CANISTER SUCT 3000ML PPV (MISCELLANEOUS) IMPLANT
CHLORAPREP W/TINT 26 (MISCELLANEOUS) ×3 IMPLANT
CLIP APPLIE 5 13 M/L LIGAMAX5 (MISCELLANEOUS) IMPLANT
COVER SURGICAL LIGHT HANDLE (MISCELLANEOUS) ×3 IMPLANT
COVER WAND RF STERILE (DRAPES) ×1 IMPLANT
DERMABOND ADVANCED (GAUZE/BANDAGES/DRESSINGS) ×2
DERMABOND ADVANCED .7 DNX12 (GAUZE/BANDAGES/DRESSINGS) ×1 IMPLANT
DEVICE SECURE STRAP 25 ABSORB (INSTRUMENTS) IMPLANT
DRAIN CHANNEL 19F RND (DRAIN) ×4 IMPLANT
DRSG PAD ABDOMINAL 8X10 ST (GAUZE/BANDAGES/DRESSINGS) ×2 IMPLANT
DRSG TEGADERM 4X4.75 (GAUZE/BANDAGES/DRESSINGS) ×2 IMPLANT
ELECT REM PT RETURN 9FT ADLT (ELECTROSURGICAL) ×3
ELECTRODE REM PT RTRN 9FT ADLT (ELECTROSURGICAL) ×1 IMPLANT
EVACUATOR SILICONE 100CC (DRAIN) ×4 IMPLANT
GLOVE BIOGEL PI IND STRL 7.0 (GLOVE) ×1 IMPLANT
GLOVE BIOGEL PI INDICATOR 7.0 (GLOVE) ×2
GLOVE SURG SS PI 7.0 STRL IVOR (GLOVE) ×1 IMPLANT
GOWN STRL REUS W/ TWL LRG LVL3 (GOWN DISPOSABLE) ×3 IMPLANT
GOWN STRL REUS W/TWL LRG LVL3 (GOWN DISPOSABLE) ×9
GRASPER SUT TROCAR 14GX15 (MISCELLANEOUS) ×3 IMPLANT
KIT BASIN OR (CUSTOM PROCEDURE TRAY) ×3 IMPLANT
KIT TURNOVER KIT B (KITS) ×3 IMPLANT
MESH PROLENE PML 12X12 (Mesh General) ×2 IMPLANT
NEEDLE 22X1 1/2 (OR ONLY) (NEEDLE) ×3 IMPLANT
NS IRRIG 1000ML POUR BTL (IV SOLUTION) ×3 IMPLANT
PAD ARMBOARD 7.5X6 YLW CONV (MISCELLANEOUS) ×6 IMPLANT
PENCIL SMOKE EVACUATOR (MISCELLANEOUS) ×2 IMPLANT
SCISSORS LAP 5X35 DISP (ENDOMECHANICALS) ×3 IMPLANT
SET IRRIG TUBING LAPAROSCOPIC (IRRIGATION / IRRIGATOR) IMPLANT
SET TUBE SMOKE EVAC HIGH FLOW (TUBING) ×3 IMPLANT
SHEARS HARMONIC ACE PLUS 36CM (ENDOMECHANICALS) IMPLANT
SLEEVE ENDOPATH XCEL 5M (ENDOMECHANICALS) ×6 IMPLANT
SPONGE LAP 18X18 RF (DISPOSABLE) ×6 IMPLANT
SUT ETHILON 2 0 FS 18 (SUTURE) ×4 IMPLANT
SUT MNCRL AB 4-0 PS2 18 (SUTURE) ×5 IMPLANT
SUT NOVA NAB GS-21 0 18 T12 DT (SUTURE) ×9 IMPLANT
SUT PDS AB 0 CT 36 (SUTURE) ×8 IMPLANT
SUT VIC AB 2-0 SH 27 (SUTURE) ×9
SUT VIC AB 2-0 SH 27X BRD (SUTURE) IMPLANT
TOWEL GREEN STERILE (TOWEL DISPOSABLE) ×3 IMPLANT
TOWEL GREEN STERILE FF (TOWEL DISPOSABLE) ×3 IMPLANT
TRAY FOLEY W/BAG SLVR 16FR (SET/KITS/TRAYS/PACK)
TRAY FOLEY W/BAG SLVR 16FR ST (SET/KITS/TRAYS/PACK) IMPLANT
TRAY LAPAROSCOPIC MC (CUSTOM PROCEDURE TRAY) ×3 IMPLANT
TROCAR XCEL 12X100 BLDLESS (ENDOMECHANICALS) ×1 IMPLANT
TROCAR XCEL NON-BLD 11X100MML (ENDOMECHANICALS) IMPLANT
TROCAR XCEL NON-BLD 5MMX100MML (ENDOMECHANICALS) ×3 IMPLANT
TUBE CONNECTING 12'X1/4 (SUCTIONS) ×1
TUBE CONNECTING 12X1/4 (SUCTIONS) ×1 IMPLANT
WATER STERILE IRR 1000ML POUR (IV SOLUTION) ×3 IMPLANT
YANKAUER SUCT BULB TIP NO VENT (SUCTIONS) ×2 IMPLANT

## 2019-07-01 NOTE — Anesthesia Procedure Notes (Signed)
Procedure Name: Intubation Date/Time: 07/01/2019 7:54 AM Performed by: Liann Spaeth T, CRNA Pre-anesthesia Checklist: Patient identified, Emergency Drugs available, Suction available and Patient being monitored Patient Re-evaluated:Patient Re-evaluated prior to induction Oxygen Delivery Method: Circle system utilized Preoxygenation: Pre-oxygenation with 100% oxygen Induction Type: IV induction Ventilation: Mask ventilation without difficulty Laryngoscope Size: Miller and 2 Grade View: Grade I Tube type: Oral Tube size: 7.5 mm Number of attempts: 1 Airway Equipment and Method: Patient positioned with wedge pillow and Stylet Placement Confirmation: ETT inserted through vocal cords under direct vision,  positive ETCO2 and breath sounds checked- equal and bilateral Secured at: 22 cm Tube secured with: Tape Dental Injury: Teeth and Oropharynx as per pre-operative assessment

## 2019-07-01 NOTE — Transfer of Care (Signed)
Immediate Anesthesia Transfer of Care Note  Patient: [name]  Procedure(s) Performed: OPEN INCISIONAL HERNIA REPAIR WITH MESH (N/A Abdomen) Insertion Of Mesh (N/A Abdomen) Laparoscopy Diagnostic (N/A Abdomen)  Patient Location: PACU  Anesthesia Type:General  Level of Consciousness: drowsy  Airway & Oxygen Therapy: Patient Spontanous Breathing and Patient connected to nasal cannula oxygen  Post-op Assessment: Report given to RN, Post -op Vital signs reviewed and stable and Patient moving all extremities  Post vital signs: Reviewed and stable  Last Vitals:  Vitals Value Taken Time  BP 154/87 07/01/19 1127  Temp    Pulse 59 07/01/19 1129  Resp 17 07/01/19 1129  SpO2 99 % 07/01/19 1129  Vitals shown include unvalidated device data.  Last Pain:  Vitals:   07/01/19 0615  TempSrc: Oral  PainSc:       Patients Stated Pain Goal: 2 (11/94/17 4081)  Complications: No apparent anesthesia complications

## 2019-07-01 NOTE — Anesthesia Postprocedure Evaluation (Signed)
Anesthesia Post Note  Patient: [name]  Procedure(s) Performed: OPEN INCISIONAL HERNIA REPAIR WITH MESH (N/A Abdomen) Insertion Of Mesh (N/A Abdomen) Laparoscopy Diagnostic (N/A Abdomen)     Patient location during evaluation: PACU Anesthesia Type: General Level of consciousness: awake and alert Pain management: pain level controlled Vital Signs Assessment: post-procedure vital signs reviewed and stable Respiratory status: spontaneous breathing, nonlabored ventilation, respiratory function stable and patient connected to nasal cannula oxygen Cardiovascular status: blood pressure returned to baseline and stable Postop Assessment: no apparent nausea or vomiting Anesthetic complications: no    Last Vitals:  Vitals:   07/01/19 1227 07/01/19 1331  BP: (!) 139/94 (!) 151/95  Pulse: 74 79  Resp: 12   Temp:  36.6 C  SpO2: 94% 100%    Last Pain:  Vitals:   07/01/19 1331  TempSrc: Oral  PainSc:                  Audry Pili

## 2019-07-01 NOTE — H&P (Signed)
[name] is an 58 y.o. female.   Chief Complaint: hernia HPI: 58 yo female s/p sleeve gastrectomy with bleeding complication who underwent ex lap for hemostasis. She had a hernia occur postop and presents for repair.  Past Medical History:  Diagnosis Date  . Arthritis    back, hands , knees   . Chest pain 2010   due to sarcoidosis- per pt.   . Depression   . GERD (gastroesophageal reflux disease)   . Hypercholesterolemia   . Hypertension   . Pneumonia    sarcoidosis- in remission   . Postoperative nausea and vomiting 02/24/2019  . Pulmonary sarcoidosis ([place])    Treatment with Prednisone    Past Surgical History:  Procedure Laterality Date  . ABDOMINAL HYSTERECTOMY    . APPENDECTOMY    . CHOLECYSTECTOMY    . LAPAROSCOPIC GASTRIC SLEEVE RESECTION N/A 02/15/2019   Procedure: LAPAROSCOPIC GASTRIC SLEEVE RESECTION, UPPER ENDO, ERAS Pathway, Hiatal Hernia Repair;  Surgeon: Kinsinger, Arta Bruce, MD;  Location: [facility];  Service: General;  Laterality: N/A;  . LAPAROSCOPY N/A 02/17/2019   Procedure: LAPAROSCOPY DIAGNOSTIC CONVERTED TO OPEN, EVACUATION OF HEMATOMA AND LIGATION OF VESSEL;  Surgeon: Kinsinger, Arta Bruce, MD;  Location: [facility];  Service: General;  Laterality: N/A;    Family History  Problem Relation Age of Onset  . Hypertension Mother   . Lupus Father   . Colon cancer Neg Hx    Social History:  reports that she quit smoking about 6 years ago. Her smoking use included cigarettes. She has a 6.00 pack-year smoking history. She has never used smokeless tobacco. She reports that she does not drink alcohol or use drugs.  Allergies:  Allergies  Allergen Reactions  . Aspirin Hives    Medications Prior to Admission  Medication Sig Dispense Refill  . ALPRAZolam (XANAX) 0.5 MG tablet Take 0.5 mg by mouth 2 (two) times daily as needed for anxiety or sleep.     Marland Kitchen amLODipine (NORVASC) 5 MG tablet Take 5 mg by mouth daily.    Marland Kitchen escitalopram (LEXAPRO) 5 MG tablet Take 5 mg by  mouth daily.     Marland Kitchen gabapentin (NEURONTIN) 300 MG capsule Take 300 mg by mouth 3 (three) times daily.     Marland Kitchen HYDROcodone-acetaminophen (NORCO) 10-325 MG tablet Take 1 tablet by mouth 3 (three) times daily as needed for pain.    Marland Kitchen LINZESS 145 MCG CAPS capsule Take 145 mcg by mouth daily as needed (constipation).     . Multiple Vitamins-Minerals (WOMENS 50+ MULTI VITAMIN/MIN PO) Take 1 tablet by mouth daily.    . rosuvastatin (CRESTOR) 5 MG tablet Take 5 mg by mouth daily after breakfast.    . enoxaparin (LOVENOX) 40 MG/0.4ML injection Inject 0.4 mLs (40 mg total) into the skin daily. (Patient not taking: Reported on 06/21/2019) 12 mL 0    No results found for this or any previous visit (from the past 48 hour(s)). No results found.  Review of Systems  Constitutional: Negative for chills and fever.  HENT: Negative for hearing loss.   Eyes: Negative for blurred vision and double vision.  Respiratory: Negative for cough and hemoptysis.   Cardiovascular: Negative for chest pain and palpitations.  Gastrointestinal: Negative for abdominal pain, nausea and vomiting.  Genitourinary: Negative for dysuria and urgency.  Musculoskeletal: Negative for myalgias and neck pain.  Skin: Negative for itching and rash.  Neurological: Negative for dizziness, tingling and headaches.  Endo/Heme/Allergies: Does not bruise/bleed easily.  Psychiatric/Behavioral: Negative for depression  and suicidal ideas.    Blood pressure 127/68, pulse 75, temperature 98 F (36.7 C), temperature source Oral, resp. rate 18, height 5' 2.5" (1.588 m), weight 97.6 kg, SpO2 91 %. Physical Exam  Vitals reviewed. Constitutional: She is oriented to person, place, and time. She appears well-developed and well-nourished.  HENT:  Head: Normocephalic and atraumatic.  Eyes: Pupils are equal, round, and reactive to light. Conjunctivae and EOM are normal.  Neck: Normal range of motion. Neck supple.  Cardiovascular: Normal rate and regular  rhythm.  Respiratory: Effort normal and breath sounds normal.  GI: Soft. Bowel sounds are normal. She exhibits no distension. There is no abdominal tenderness.  Large epigastric hernia  Musculoskeletal: Normal range of motion.  Neurological: She is alert and oriented to person, place, and time.  Skin: Skin is warm and dry.  Psychiatric: She has a normal mood and affect. Her behavior is normal.     Assessment/Plan 58 yo female with incisional hernia -lap vs open incisional hernia repair with mesh  Rodman Pickle, MD 07/01/2019, 7:36 AM

## 2019-07-01 NOTE — Op Note (Signed)
Preoperative diagnosis: incisional hernia  Postoperative diagnosis: same   Procedure: diagnostic laparoscopy, lysis of adhesions, incisional hernia repair with mesh  Surgeon: Gurney Maxin, M.D.  Asst: Stark Klein  Anesthesia: general  Indications for procedure: [name] is a 58 y.o. year old female with symptoms of abdominal pain after emergency surgery .  Description of procedure: The patient was brought into the operative suite. Anesthesia was administered with General endotracheal anesthesia. WHO checklist was applied. The patient was then placed in supine position. The area was prepped and draped in the usual sterile fashion.  A left transverse incision was made. A 63mm trocar was used to gain access to the peritoneal cavity by optical entry technique. Pneumoperitoneum was applied with a high flow and low pressure. The laparoscope was reinserted to confirm position. The hernia was very large with insufflation and intestine and liver and omentum were adhered to the sac. Due to the level of the adhesions and size of hernia, the procedure was converted to open procedure.  A midline incision was made. Cautery was used to dissect the omentum away from the sac and fascial edge.  Liver was seen stuck up to the subcutaneous tissue and fascia in the most superior portion of the wound this was taken down with cautery.  Total lysis of adhesions time was about 1 hour.  Next, the fascial edges were incised and the anterior rectus sheath and rectus muscle were dissected free of the posterior rectus sheath.  This was done in 360 degrees.  Next, a subcutaneous plane was made above the anterior rectus sheath.  The posterior rectus sheath was then closed with a running 0 PDS.  A Prolene mesh was cut to size and placed in the retrorectus space sutured to the posterior rectus sheath with interrupted 0 Novafil's.  A 19 [place] Blake drain was placed along the mesh and brought out through the right lateral  epigastric area and sutured in place with a 2-0 nylon.  The anterior rectus sheath was closed with a running PDS with interrupted 0 Novafil's in addition.  There was some tension on the anterior closure in the mid closure section.  Additional subcutaneous plane was extended laterally to offload some of this tension.  1 additional 41 [place] Blake drain was placed in the subcu just above the anterior fascial closure and brought out through the right lower abdomen and sutured in place with 2-0 nylon.  The skin scar was excised with cautery and the skin subcutaneous tissue was closed with interrupted Vicryl.  Hemostasis was applied to the skin edge and then a 4-0 Monocryl was used to close the skin in running fashion.  Patient tolerated procedure and was brought to PACU in stable condition.  All counts were correct.  Findings: large incisional hernia  Specimen: none  Implant: prolene mesh   Blood loss: 100 ml  Local anesthesia: 10 ml marcaine   Complications: none  Gurney Maxin, M.D. General, Bariatric, & Minimally Invasive Surgery [facility]

## 2019-07-02 ENCOUNTER — Encounter (HOSPITAL_COMMUNITY): Payer: Self-pay | Admitting: General Surgery

## 2019-07-02 LAB — CBC
HCT: 33.6 % — ABNORMAL LOW (ref 36.0–46.0)
Hemoglobin: 11.1 g/dL — ABNORMAL LOW (ref 12.0–15.0)
MCH: 26.4 pg (ref 26.0–34.0)
MCHC: 33 g/dL (ref 30.0–36.0)
MCV: 80 fL (ref 80.0–100.0)
Platelets: 238 10*3/uL (ref 150–400)
RBC: 4.2 MIL/uL (ref 3.87–5.11)
RDW: 14.9 % (ref 11.5–15.5)
WBC: 10.4 10*3/uL (ref 4.0–10.5)
nRBC: 0 % (ref 0.0–0.2)

## 2019-07-02 LAB — BASIC METABOLIC PANEL
Anion gap: 11 (ref 5–15)
BUN: 9 mg/dL (ref 6–20)
CO2: 22 mmol/L (ref 22–32)
Calcium: 9 mg/dL (ref 8.9–10.3)
Chloride: 105 mmol/L (ref 98–111)
Creatinine, Ser: 0.73 mg/dL (ref 0.44–1.00)
GFR calc Af Amer: >60 mL/min (ref >60)
GFR calc non Af Amer: >60 mL/min (ref >60)
Glucose, Bld: 115 mg/dL — ABNORMAL HIGH (ref 70–99)
Potassium: 4.6 mmol/L (ref 3.5–5.1)
Sodium: 138 mmol/L (ref 135–145)

## 2019-07-02 MED ORDER — OXYCODONE HCL 5 MG PO TABS
5.0000 mg | ORAL_TABLET | Freq: Four times a day (QID) | ORAL | Status: DC | PRN
  Administered 2019-07-02 – 2019-07-04 (×8): 5 mg via ORAL
  Filled 2019-07-02 (×8): qty 1

## 2019-07-02 MED ORDER — WHITE PETROLATUM EX OINT
TOPICAL_OINTMENT | CUTANEOUS | Status: AC
  Filled 2019-07-02: qty 28.35

## 2019-07-02 NOTE — Progress Notes (Signed)
Progress Note: General Surgery Service   Chief Complaint/Subjective: Pain especially with movement, she has walked in the room. She is tolerating liquids  Objective: Vital signs in last 24 hours: Temp:  [97.3 F (36.3 C)-98.7 F (37.1 C)] 98.4 F (36.9 C) (11/20 0400) Pulse Rate:  [60-88] 88 (11/20 0400) Resp:  [12-18] 18 (11/20 0400) BP: (100-154)/(77-95) 100/83 (11/20 0400) SpO2:  [91 %-100 %] 99 % (11/20 0400)    Intake/Output from previous day: 11/19 0701 - 11/20 0700 In: 2100 [P.O.:200; I.V.:1900] Out: 475 [Urine:200; Drains:175; Blood:100] Intake/Output this shift: No intake/output data recorded.  Gen: NAd  Resp: nonlabored  Card: RRR  Abd: soft, incisions c/d/i, drain with serosanguinous output, painful around mid epigastrium  Lab Results: CBC  Recent Labs    07/01/19 1422 07/02/19 0236  WBC 12.0* 10.4  HGB 12.8 11.1*  HCT 39.9 33.6*  PLT 302 238   BMET Recent Labs    07/01/19 1422 07/02/19 0236  NA  --  138  K  --  4.6  CL  --  105  CO2  --  22  GLUCOSE  --  115*  BUN  --  9  CREATININE 0.71 0.73  CALCIUM  --  9.0   PT/INR No results for input(s): LABPROT, INR in the last 72 hours. ABG No results for input(s): PHART, HCO3 in the last 72 hours.  Invalid input(s): PCO2, PO2  Anti-infectives: Anti-infectives (From admission, onward)   Start     Dose/Rate Route Frequency Ordered Stop   07/01/19 0600  ceFAZolin (ANCEF) IVPB 2g/100 mL premix     2 g 200 mL/hr over 30 Minutes Intravenous On call to O.R. 07/01/19 0545 07/01/19 0800      Medications: Scheduled Meds: . acetaminophen  1,000 mg Oral Q6H  . amLODipine  5 mg Oral Daily  . enoxaparin (LOVENOX) injection  40 mg Subcutaneous Q24H  . escitalopram  5 mg Oral Daily  . gabapentin  300 mg Oral TID  . rosuvastatin  5 mg Oral QPC breakfast  . white petrolatum       Continuous Infusions: PRN Meds:.ALPRAZolam, diphenhydrAMINE **OR** diphenhydrAMINE, ketorolac, linaclotide, morphine  injection, ondansetron **OR** ondansetron (ZOFRAN) IV, oxyCODONE, polyethylene glycol  Assessment/Plan: s/p Procedure(s): OPEN INCISIONAL HERNIA REPAIR WITH MESH Insertion Of Mesh Laparoscopy Diagnostic 07/01/2019 -change narcotic -saline lock -PT eval for tips on movement/offloading tension -continue inpatient care   LOS: 1 day   Mickeal Skinner, MD [facility]

## 2019-07-02 NOTE — Evaluation (Signed)
Physical Therapy Evaluation Patient Details Name: [name] MRN: [id number] DOB: 1960/11/27 Today's Date: 07/02/2019   History of Present Illness  58yo female s/p sleeve gastrectomy 10/14/68 complicated by bleeding complication, received exploratory laparotomy and hematoma evacuation 02/17/19. Now s/p incisional hernia repair with mesh placement 07/01/19. PMH HTN, pulmonary sarcoidosis  Clinical Impression   Patient received sitting at EOB, very pleasant and willing to participate in PT this morning. Able to complete functional transfers and gait approximately 237f with S and no device, although gait was very antalgic and gait mechanics impaired due to pain. Discussed and practiced bed mobility with log rolling technique to reduce strain on abdomen for getting in and out of bed, also discussed general precautions such as avoiding bending twisting excessively, defer to surgeon for lifting restrictions. She was left in bed with all needs met and questions/concerns addressed this morning.     Follow Up Recommendations No PT follow up    Equipment Recommendations  3in1 (PT)    Recommendations for Other Services       Precautions / Restrictions Precautions Precautions: Other (comment) Precaution Comments: abdominal incisions and JPs (saw order for abdominal binder but did not see it in the room) Restrictions Weight Bearing Restrictions: No      Mobility  Bed Mobility Overal bed mobility: Needs Assistance Bed Mobility: Rolling;Sidelying to Sit;Sit to Sidelying Rolling: Supervision Sidelying to sit: Supervision     Sit to sidelying: Supervision General bed mobility comments: S and cues for technique to reduce strain on abdomen  Transfers Overall transfer level: Needs assistance Equipment used: None Transfers: Sit to/from Stand Sit to Stand: Supervision         General transfer comment: increased time and effort  Ambulation/Gait Ambulation/Gait assistance:  Supervision Gait Distance (Feet): 250 Feet Assistive device: None Gait Pattern/deviations: Step-through pattern;Decreased stride length;Antalgic Gait velocity: decreased   General Gait Details: antalgic gait pattern, holds trunk rigid due to pain; mild unsteadiness but able to self correct, gait speed limited by pain  Stairs            Wheelchair Mobility    Modified Rankin (Stroke Patients Only)       Balance Overall balance assessment: Mild deficits observed, not formally tested                                           Pertinent Vitals/Pain Pain Assessment: 0-10 Pain Score: 8  Pain Location: abdomen Pain Descriptors / Indicators: Aching;Sore Pain Intervention(s): Limited activity within patient's tolerance;Monitored during session;Premedicated before session    Home Living Family/patient expects to be discharged to:: Private residence   Available Help at Discharge: Family;Available 24 hours/day Type of Home: House Home Access: Stairs to enter Entrance Stairs-Rails: Can reach both Entrance Stairs-Number of Steps: 4 Home Layout: One level Home Equipment: None Additional Comments: has been driving and doing errands, able to care for herself and house, independent at baseline    Prior Function Level of Independence: Independent               Hand Dominance   Dominant Hand: Right    Extremity/Trunk Assessment   Upper Extremity Assessment Upper Extremity Assessment: Overall WFL for tasks assessed    Lower Extremity Assessment Lower Extremity Assessment: Overall WFL for tasks assessed    Cervical / Trunk Assessment Cervical / Trunk Assessment: Normal  Communication   Communication: No difficulties  Cognition Arousal/Alertness: Awake/alert Behavior During Therapy: WFL for tasks assessed/performed Overall Cognitive Status: Within Functional Limits for tasks assessed                                         General Comments      Exercises     Assessment/Plan    PT Assessment Patient needs continued PT services  PT Problem List Obesity;Decreased activity tolerance;Pain;Decreased mobility       PT Treatment Interventions DME instruction;Balance training;Gait training;Neuromuscular re-education;Stair training;Functional mobility training;Patient/family education;Therapeutic activities;Therapeutic exercise    PT Goals (Current goals can be found in the Care Plan section)  Acute Rehab PT Goals Patient Stated Goal: less pain PT Goal Formulation: With patient Time For Goal Achievement: 07/16/19 Potential to Achieve Goals: Good    Frequency Min 3X/week   Barriers to discharge        Co-evaluation               AM-PAC PT "6 Clicks" Mobility  Outcome Measure Help needed turning from your back to your side while in a flat bed without using bedrails?: A Little Help needed moving from lying on your back to sitting on the side of a flat bed without using bedrails?: A Little Help needed moving to and from a bed to a chair (including a wheelchair)?: A Little Help needed standing up from a chair using your arms (e.g., wheelchair or bedside chair)?: A Little Help needed to walk in hospital room?: A Little Help needed climbing 3-5 steps with a railing? : A Little 6 Click Score: 18    End of Session   Activity Tolerance: Patient tolerated treatment well Patient left: in bed;with call bell/phone within reach   PT Visit Diagnosis: Difficulty in walking, not elsewhere classified (R26.2);Pain Pain - Right/Left: (abdomen) Pain - part of body: (abdomen)    Time: 5747-3403 PT Time Calculation (min) (ACUTE ONLY): 15 min   Charges:   PT Evaluation $PT Eval Moderate Complexity: 1 Mod          Windell Norfolk, DPT, PN1   Supplemental Physical Therapist [place]    Pager [phone] Acute Rehab Office [phone]

## 2019-07-03 NOTE — Progress Notes (Signed)
Progress Note: General Surgery Service   Chief Complaint/Subjective: Pain worse today on right side  Objective: Vital signs in last 24 hours: Temp:  [98.5 F (36.9 C)-98.9 F (37.2 C)] 98.9 F (37.2 C) (11/21 0357) Pulse Rate:  [78-91] 91 (11/21 0357) Resp:  [18] 18 (11/21 0357) BP: (115-119)/(67-75) 115/75 (11/21 0357) SpO2:  [97 %-100 %] 97 % (11/21 0357) Last BM Date: 06/30/19  Intake/Output from previous day: 11/20 0701 - 11/21 0700 In: 480 [P.O.:480] Out: 175 [Drains:175] Intake/Output this shift: Total I/O In: 120 [P.O.:120] Out: 45 [Drains:45]  Gen: NAd  Resp: nonlabored  Card: RRR  Abd: soft, incision c/d/i, drains with thin clear output, tender throughout upper abdomen  Lab Results: CBC  Recent Labs    07/01/19 1422 07/02/19 0236  WBC 12.0* 10.4  HGB 12.8 11.1*  HCT 39.9 33.6*  PLT 302 238   BMET Recent Labs    07/01/19 1422 07/02/19 0236  NA  --  138  K  --  4.6  CL  --  105  CO2  --  22  GLUCOSE  --  115*  BUN  --  9  CREATININE 0.71 0.73  CALCIUM  --  9.0   PT/INR No results for input(s): LABPROT, INR in the last 72 hours. ABG No results for input(s): PHART, HCO3 in the last 72 hours.  Invalid input(s): PCO2, PO2  Anti-infectives: Anti-infectives (From admission, onward)   Start     Dose/Rate Route Frequency Ordered Stop   07/01/19 0600  ceFAZolin (ANCEF) IVPB 2g/100 mL premix     2 g 200 mL/hr over 30 Minutes Intravenous On call to O.R. 07/01/19 0545 07/01/19 0800      Medications: Scheduled Meds: . acetaminophen  1,000 mg Oral Q6H  . amLODipine  5 mg Oral Daily  . enoxaparin (LOVENOX) injection  40 mg Subcutaneous Q24H  . escitalopram  5 mg Oral Daily  . gabapentin  300 mg Oral TID  . rosuvastatin  5 mg Oral QPC breakfast   Continuous Infusions: PRN Meds:.ALPRAZolam, diphenhydrAMINE **OR** diphenhydrAMINE, ketorolac, linaclotide, morphine injection, ondansetron **OR** ondansetron (ZOFRAN) IV, oxyCODONE, polyethylene  glycol  Assessment/Plan: s/p Procedure(s): OPEN INCISIONAL HERNIA REPAIR WITH MESH Insertion Of Mesh Laparoscopy Diagnostic 07/01/2019 -continue multimodal pain control -ambulate/sit in chair -continue diet   LOS: 2 days   Mickeal Skinner, MD [facility]

## 2019-07-04 MED ORDER — OXYCODONE HCL 5 MG PO TABS
10.0000 mg | ORAL_TABLET | Freq: Four times a day (QID) | ORAL | Status: DC | PRN
  Administered 2019-07-04 – 2019-07-06 (×7): 10 mg via ORAL
  Filled 2019-07-04 (×7): qty 2

## 2019-07-04 NOTE — Progress Notes (Signed)
Progress Note: General Surgery Service   Chief Complaint/Subjective: Tearful, was up and walking at 3 am. Tolerating diet, +BM yesterday  Objective: Vital signs in last 24 hours: Temp:  [98.6 F (37 C)-99.6 F (37.6 C)] 99.6 F (37.6 C) (11/22 0455) Pulse Rate:  [83-103] 97 (11/22 0455) Resp:  [16-18] 18 (11/22 0455) BP: (103-117)/(68-80) 103/72 (11/22 0455) SpO2:  [100 %] 100 % (11/22 0455) Last BM Date: 07/03/19  Intake/Output from previous day: 11/21 0701 - 11/22 0700 In: 600 [P.O.:600] Out: 168 [Drains:168] Intake/Output this shift: No intake/output data recorded.  Gen: NAD  Resp: nonlabored  Card: RRR  Abd: soft, ATTP, incision c/d/i, drains with serosanguinous output  Lab Results: CBC  Recent Labs    07/01/19 1422 07/02/19 0236  WBC 12.0* 10.4  HGB 12.8 11.1*  HCT 39.9 33.6*  PLT 302 238   BMET Recent Labs    07/01/19 1422 07/02/19 0236  NA  --  138  K  --  4.6  CL  --  105  CO2  --  22  GLUCOSE  --  115*  BUN  --  9  CREATININE 0.71 0.73  CALCIUM  --  9.0   PT/INR No results for input(s): LABPROT, INR in the last 72 hours. ABG No results for input(s): PHART, HCO3 in the last 72 hours.  Invalid input(s): PCO2, PO2  Anti-infectives: Anti-infectives (From admission, onward)   Start     Dose/Rate Route Frequency Ordered Stop   07/01/19 0600  ceFAZolin (ANCEF) IVPB 2g/100 mL premix     2 g 200 mL/hr over 30 Minutes Intravenous On call to O.R. 07/01/19 0545 07/01/19 0800      Medications: Scheduled Meds: . acetaminophen  1,000 mg Oral Q6H  . amLODipine  5 mg Oral Daily  . enoxaparin (LOVENOX) injection  40 mg Subcutaneous Q24H  . escitalopram  5 mg Oral Daily  . gabapentin  300 mg Oral TID  . rosuvastatin  5 mg Oral QPC breakfast   Continuous Infusions: PRN Meds:.ALPRAZolam, diphenhydrAMINE **OR** diphenhydrAMINE, ketorolac, linaclotide, morphine injection, ondansetron **OR** ondansetron (ZOFRAN) IV, oxyCODONE, polyethylene  glycol  Assessment/Plan: s/p Procedure(s): OPEN INCISIONAL HERNIA REPAIR WITH MESH Insertion Of Mesh Laparoscopy Diagnostic 07/01/2019 -increase oxycodone -continue multimodal pain control -continue ambulation and binder    LOS: 3 days   Mickeal Skinner, MD [facility]

## 2019-07-05 MED ORDER — METHOCARBAMOL 750 MG PO TABS
750.0000 mg | ORAL_TABLET | Freq: Three times a day (TID) | ORAL | Status: DC
  Administered 2019-07-05 – 2019-07-06 (×3): 750 mg via ORAL
  Filled 2019-07-05 (×3): qty 1

## 2019-07-05 NOTE — Progress Notes (Signed)
Physical Therapy Discharge Patient Details Name: [name] MRN: [id number] DOB: 1961/05/18 Today's Date: 07/05/2019 Time: 5001-6429 PT Time Calculation (min) (ACUTE ONLY): 8 min  Patient discharged from PT services secondary to goals met and no further PT needs identified.  Please see latest therapy progress note for current level of functioning and progress toward goals.    Progress and discharge plan discussed with patient and/or caregiver: Patient/Caregiver agrees with plan  GP    Silvana Newness, SPT  Silvana Newness 07/05/2019, 1:13 PM

## 2019-07-05 NOTE — Progress Notes (Signed)
Physical Therapy Treatment Patient Details Name: [name] MRN: [id number] DOB: Feb 24, 1961 Today's Date: 07/05/2019    History of Present Illness 58yo female s/p sleeve gastrectomy 5/0/38 complicated by bleeding complication, received exploratory laparotomy and hematoma evacuation 02/17/19. Now s/p incisional hernia repair with mesh placement 07/01/19. PMH HTN, pulmonary sarcoidosis    PT Comments    Pt has met all of her goals at this time. She demonstrates good safety awareness throughout transfers, gait, and stair navigation. She was able to go up and down a full flight of stairs with railing on the R without physical assistance or an AD, which is significantly more than she needs in order to enter/exit her home. She states that her abdominal pain is severe, but this does not seem to hinder her mobility overall. Pt given education on continuing LE exercises at home and safety at home. Based on her progress, still do not recommend further PT services at this time.    Follow Up Recommendations  No PT follow up     Equipment Recommendations  3in1 (PT)    Recommendations for Other Services       Precautions / Restrictions Precautions Precautions: Other (comment) Precaution Comments: wear abdominal binder for JPs and abdominal incisions Restrictions Weight Bearing Restrictions: No    Mobility  Bed Mobility               General bed mobility comments: pt in recliner upon PT arrival  Transfers Overall transfer level: Needs assistance Equipment used: None Transfers: Sit to/from Stand Sit to Stand: Modified independent (Device/Increase time)         General transfer comment: did not require increased time  Ambulation/Gait Ambulation/Gait assistance: Modified independent (Device/Increase time) Gait Distance (Feet): 200 Feet Assistive device: None Gait Pattern/deviations: Step-through pattern;WFL(Within Functional Limits) Gait velocity: decreased   General Gait  Details: pt good safety awareness throughout gait, required    Stairs Stairs: Yes Stairs assistance: Modified independent (Device/Increase time) Stair Management: One rail Right;Forwards;Alternating pattern Number of Stairs: 12 General stair comments: pt demonstrated good safety awareness throughout stair navigation and was able to exceed the number of stairs required to enter her home without significant difficulty.    Wheelchair Mobility    Modified Rankin (Stroke Patients Only)       Balance Overall balance assessment: Modified Independent                                          Cognition Arousal/Alertness: Awake/alert Behavior During Therapy: WFL for tasks assessed/performed Overall Cognitive Status: Within Functional Limits for tasks assessed                                        Exercises      General Comments        Pertinent Vitals/Pain Pain Assessment: 0-10 Pain Score: 10-Worst pain ever Pain Location: abdomen Pain Descriptors / Indicators: Aching;Sore Pain Intervention(s): Limited activity within patient's tolerance;Monitored during session    Home Living                      Prior Function            PT Goals (current goals can now be found in the care plan section) Acute Rehab PT Goals Patient Stated Goal: less  pain Progress towards PT goals: Goals met/education completed, patient discharged from PT    Frequency    Min 3X/week      PT Plan Discharge plan needs to be updated    Co-evaluation              AM-PAC PT "6 Clicks" Mobility   Outcome Measure  Help needed turning from your back to your side while in a flat bed without using bedrails?: A Little Help needed moving from lying on your back to sitting on the side of a flat bed without using bedrails?: A Little Help needed moving to and from a bed to a chair (including a wheelchair)?: None Help needed standing up from a chair using  your arms (e.g., wheelchair or bedside chair)?: None Help needed to walk in hospital room?: None Help needed climbing 3-5 steps with a railing? : None 6 Click Score: 22    End of Session Equipment Utilized During Treatment: Gait belt;Other (comment)(abdominal binder) Activity Tolerance: Patient tolerated treatment well Patient left: in chair;with call bell/phone within reach Nurse Communication: Mobility status PT Visit Diagnosis: Difficulty in walking, not elsewhere classified (R26.2);Pain Pain - part of body: (abdomen)     Time: 1053-1101 PT Time Calculation (min) (ACUTE ONLY): 8 min  Charges:  $Gait Training: 8-22 mins                     Silvana Newness, SPT   [place] Cherrise Occhipinti 07/05/2019, 1:07 PM

## 2019-07-05 NOTE — Progress Notes (Signed)
Progress Note: General Surgery Service   Chief Complaint/Subjective: Still lots of periincisional pain, some seep through around drain sites  Objective: Vital signs in last 24 hours: Temp:  [97.6 F (36.4 C)-98.9 F (37.2 C)] 98.9 F (37.2 C) (11/23 0450) Pulse Rate:  [78-88] 88 (11/23 0450) Resp:  [12-18] 18 (11/23 0450) BP: (103-129)/(63-85) 129/85 (11/23 0450) SpO2:  [95 %-100 %] 100 % (11/23 0450) Last BM Date: 07/03/19  Intake/Output from previous day: 11/22 0701 - 11/23 0700 In: 480 [P.O.:480] Out: 110 [Drains:110] Intake/Output this shift: No intake/output data recorded.  Gen: NAD  Resp: nonlabored  Card: RRR  Abd: soft, NT, ND, incision c/d/i, drains with dark red drainage  Lab Results: CBC  No results for input(s): WBC, HGB, HCT, PLT in the last 72 hours. BMET No results for input(s): NA, K, CL, CO2, GLUCOSE, BUN, CREATININE, CALCIUM in the last 72 hours. PT/INR No results for input(s): LABPROT, INR in the last 72 hours. ABG No results for input(s): PHART, HCO3 in the last 72 hours.  Invalid input(s): PCO2, PO2  Anti-infectives: Anti-infectives (From admission, onward)   Start     Dose/Rate Route Frequency Ordered Stop   07/01/19 0600  ceFAZolin (ANCEF) IVPB 2g/100 mL premix     2 g 200 mL/hr over 30 Minutes Intravenous On call to O.R. 07/01/19 0545 07/01/19 0800      Medications: Scheduled Meds: . acetaminophen  1,000 mg Oral Q6H  . amLODipine  5 mg Oral Daily  . enoxaparin (LOVENOX) injection  40 mg Subcutaneous Q24H  . escitalopram  5 mg Oral Daily  . gabapentin  300 mg Oral TID  . methocarbamol  750 mg Oral TID  . rosuvastatin  5 mg Oral QPC breakfast   Continuous Infusions: PRN Meds:.ALPRAZolam, diphenhydrAMINE **OR** diphenhydrAMINE, ketorolac, linaclotide, morphine injection, ondansetron **OR** ondansetron (ZOFRAN) IV, oxyCODONE, polyethylene glycol  Assessment/Plan: s/p Procedure(s): OPEN INCISIONAL HERNIA REPAIR WITH  MESH Insertion Of Mesh Laparoscopy Diagnostic 07/01/2019 -labs in am -add muscle relaxant -continue to ambulate -likely home tomorrow    LOS: 4 days   Mickeal Skinner, MD [facility]

## 2019-07-06 LAB — CBC
HCT: 27 % — ABNORMAL LOW (ref 36.0–46.0)
Hemoglobin: 8.6 g/dL — ABNORMAL LOW (ref 12.0–15.0)
MCH: 26.5 pg (ref 26.0–34.0)
MCHC: 31.9 g/dL (ref 30.0–36.0)
MCV: 83.1 fL (ref 80.0–100.0)
Platelets: 251 10*3/uL (ref 150–400)
RBC: 3.25 MIL/uL — ABNORMAL LOW (ref 3.87–5.11)
RDW: 14.9 % (ref 11.5–15.5)
WBC: 5 10*3/uL (ref 4.0–10.5)
nRBC: 0 % (ref 0.0–0.2)

## 2019-07-06 LAB — BASIC METABOLIC PANEL
Anion gap: 9 (ref 5–15)
BUN: 16 mg/dL (ref 6–20)
CO2: 23 mmol/L (ref 22–32)
Calcium: 8.8 mg/dL — ABNORMAL LOW (ref 8.9–10.3)
Chloride: 106 mmol/L (ref 98–111)
Creatinine, Ser: 0.89 mg/dL (ref 0.44–1.00)
GFR calc Af Amer: >60 mL/min (ref >60)
GFR calc non Af Amer: >60 mL/min (ref >60)
Glucose, Bld: 99 mg/dL (ref 70–99)
Potassium: 4.3 mmol/L (ref 3.5–5.1)
Sodium: 138 mmol/L (ref 135–145)

## 2019-07-06 MED ORDER — IBUPROFEN 600 MG PO TABS: 600.0000 mg | ORAL_TABLET | Freq: Three times a day (TID) | ORAL | 0 refills | Status: DC | PRN

## 2019-07-06 MED ORDER — METHOCARBAMOL 500 MG PO TABS: 500.0000 mg | ORAL_TABLET | Freq: Three times a day (TID) | ORAL | 0 refills | Status: DC | PRN

## 2019-07-06 MED ORDER — OXYCODONE HCL 10 MG PO TABS: 10.0000 mg | ORAL_TABLET | ORAL | 0 refills | Status: DC | PRN

## 2019-07-06 NOTE — Progress Notes (Signed)
Patient discharged to home with instructions and medications. 

## 2019-07-06 NOTE — Discharge Summary (Signed)
Physician Discharge Summary  Boyd Litaker SWF:[id number] DOB: 04/11/1961 DOA: 07/01/2019  PCP: Kari Baars, MD  Admit date: 07/01/2019 Discharge date: 07/06/2019  Recommendations for Outpatient Follow-up:  1. noen (include homehealth, outpatient follow-up instructions, specific recommendations for PCP to follow-up on, etc.)   Discharge Diagnoses:  Active Problems:   Incisional hernia   Surgical Procedure: incisional hernia repair with mesh  Discharge Condition: Good Disposition: Home  Diet recommendation: bariatric diet   Hospital Course:  58 yo female with incisional hernia after bariatric surgery presents for repair. She underwent retrorectus mesh repair and was admitted to the surgical floor post op. She had pain issues and was put on a multimodal regimen. She was able to tolerate a diet and ambulate and have return of bowel movements. After 5 days her pain was improved and she was discharged home.  Discharge Instructions  Discharge Instructions    Call MD for:  difficulty breathing, headache or visual disturbances   Complete by: As directed    Call MD for:  hives   Complete by: As directed    Call MD for:  persistant nausea and vomiting   Complete by: As directed    Call MD for:  redness, tenderness, or signs of infection (pain, swelling, redness, odor or green/yellow discharge around incision site)   Complete by: As directed    Call MD for:  severe uncontrolled pain   Complete by: As directed    Call MD for:  temperature >100.4   Complete by: As directed    Diet - low sodium heart healthy   Complete by: As directed    Discharge wound care:   Complete by: As directed    Ok to shower tomorrow. Glue will likely peel off in 1-3 weeks. No bandage required.  Continue drain care, empty 1-2 times a day and when full   Driving Restrictions   Complete by: As directed    No driving while on narcotics   Increase activity slowly   Complete by: As directed    Lifting restrictions   Complete by: As directed    No lifting greater than 20 pounds for 3 weeks     Allergies as of 07/06/2019      Reactions   Aspirin Hives      Medication List    STOP taking these medications   enoxaparin 40 MG/0.4ML injection Commonly known as: LOVENOX   HYDROcodone-acetaminophen 10-325 MG tablet Commonly known as: NORCO     TAKE these medications   ALPRAZolam 0.5 MG tablet Commonly known as: XANAX Take 0.5 mg by mouth 2 (two) times daily as needed for anxiety or sleep.   amLODipine 5 MG tablet Commonly known as: NORVASC Take 5 mg by mouth daily.   escitalopram 5 MG tablet Commonly known as: LEXAPRO Take 5 mg by mouth daily.   gabapentin 300 MG capsule Commonly known as: NEURONTIN Take 300 mg by mouth 3 (three) times daily.   ibuprofen 600 MG tablet Commonly known as: ADVIL Take 1 tablet (600 mg total) by mouth every 8 (eight) hours as needed for up to 30 doses.   Linzess 145 MCG Caps capsule Generic drug: linaclotide Take 145 mcg by mouth daily as needed (constipation).   methocarbamol 500 MG tablet Commonly known as: ROBAXIN Take 1 tablet (500 mg total) by mouth every 8 (eight) hours as needed for up to 30 doses for muscle spasms.   Oxycodone HCl 10 MG Tabs Take 1 tablet (10 mg total) by  mouth every 4 (four) hours as needed (for pain).   rosuvastatin 5 MG tablet Commonly known as: CRESTOR Take 5 mg by mouth daily after breakfast.   WOMENS 50+ MULTI VITAMIN/MIN PO Take 1 tablet by mouth daily.            Discharge Care Instructions  (From admission, onward)         Start     Ordered   07/06/19 0000  Discharge wound care:    Comments: Ok to shower tomorrow. Glue will likely peel off in 1-3 weeks. No bandage required.  Continue drain care, empty 1-2 times a day and when full   07/06/19 0855            The results of significant diagnostics from this hospitalization (including imaging, microbiology, ancillary and  laboratory) are listed below for reference.    Significant Diagnostic Studies: No results found.  Labs: Basic Metabolic Panel: Recent Labs  Lab 07/01/19 1422 07/02/19 0236 07/06/19 0200  NA  --  138 138  K  --  4.6 4.3  CL  --  105 106  CO2  --  22 23  GLUCOSE  --  115* 99  BUN  --  9 16  CREATININE 0.71 0.73 0.89  CALCIUM  --  9.0 8.8*   Liver Function Tests: No results for input(s): AST, ALT, ALKPHOS, BILITOT, PROT, ALBUMIN in the last 168 hours.  CBC: Recent Labs  Lab 07/01/19 1422 07/02/19 0236 07/06/19 0200  WBC 12.0* 10.4 5.0  HGB 12.8 11.1* 8.6*  HCT 39.9 33.6* 27.0*  MCV 82.1 80.0 83.1  PLT 302 238 251    CBG: No results for input(s): GLUCAP in the last 168 hours.  Active Problems:   Incisional hernia   Time coordinating discharge: 15 min

## 2019-07-13 ENCOUNTER — Other Ambulatory Visit: Payer: Self-pay | Admitting: *Deleted

## 2019-07-13 NOTE — Patient Outreach (Addendum)
[facility]) Care Management  07/13/2019  [name] [birth year] [id number]   RED ON EMMI ALERT - General Discharge Day # 4 Date: 07/11/2019 Red Alert Reason:no follow up appointment   Outreach attempt #1, successful.  Referral received from care management assistant for above EMMI alert as member was recently hospitalized, had surgery for hernia repair.  Per chart, she also has history of HTN & GERD.  Call placed to member, identity verified.  This care manager introduced self and stated purpose of call.  [facility] care management services explained.  She report she is doing well, "taking it one day at a time."  Report she has 5 children living in the home with her, which can sometimes be overwhelming but she is managing.  State she is managing her own care, needing minimal help from her children.  She does have her follow up appointments scheduled for 12/9 with her PCP and on 12/10 with her surgeon.  State these appointments were not scheduled at the time of the EMMI call.  Denies needing transportation, will drive herself.  She has Medicaid to help pay for medications, denies any questions for medication management.  Has hypertension but report it is controlled.  Aware of proper diet to maintain blood pressure within range.  Denies any urgent concerns at this time.   Plan: RN CM will follow up with member within the next 3 months to confirm blood pressure remains controlled and she has healed completely from surgery.  [facility] CM Care Plan Problem One     Most Recent Value  Care Plan Problem One  Risk for improper managment of blood pressure related to recent surgery  Role Documenting the Problem One  Care Management [place] for Problem One  Active  [facility] Long Term Goal   Member will report maintaining blood pressure within desired goal range over the next 3 months  [facility] Long Term Goal Start Date  07/13/19  Interventions for Problem One Long Term Goal  Educated  member on management of HTN as well as pain control post surgery in effort to keep blood pressure well managed      Valente David, RN, MSN [place] Manager [phone]

## 2019-07-21 ENCOUNTER — Other Ambulatory Visit: Payer: Self-pay

## 2019-07-21 ENCOUNTER — Ambulatory Visit (INDEPENDENT_AMBULATORY_CARE_PROVIDER_SITE_OTHER): Payer: Medicare Other | Admitting: Family Medicine

## 2019-07-21 ENCOUNTER — Encounter: Payer: Self-pay | Admitting: Family Medicine

## 2019-07-21 VITALS — BP 135/90 | HR 72 | Temp 98.3°F | Ht 62.5 in | Wt 212.0 lb

## 2019-07-21 DIAGNOSIS — E7849 Other hyperlipidemia: Secondary | ICD-10-CM | POA: Diagnosis not present

## 2019-07-21 DIAGNOSIS — I158 Other secondary hypertension: Secondary | ICD-10-CM | POA: Diagnosis not present

## 2019-07-21 DIAGNOSIS — Z1231 Encounter for screening mammogram for malignant neoplasm of breast: Secondary | ICD-10-CM | POA: Diagnosis not present

## 2019-07-21 DIAGNOSIS — D869 Sarcoidosis, unspecified: Secondary | ICD-10-CM

## 2019-07-21 NOTE — Patient Instructions (Addendum)
[phone] [facility]  labwork fasting  Blood pressure monitoring  Pulmonary referral  mammogram

## 2019-07-21 NOTE — Progress Notes (Signed)
New Patient Office Visit  Subjective:  Patient ID: [name], female    DOB: 1960/10/15  Age: 58 y.o. MRN: [id number]  CC:  Chief Complaint  Patient presents with  . Establish Care  . Hernia    hernia repair    HPI [name] presents for pulmonary sarcoidosis  Hospital Course:  58 yo female with incisional hernia after bariatric surgery presents for repair. She underwent retrorectus mesh repair and was admitted to the surgical floor post op. She had pain issues and was put on a multimodal regimen. She was able to tolerate a diet and ambulate and have return of bowel movements. After 5 days her pain was improved and she was discharged home.  Allison MccreedyBarbara Lindseyis a 58 y.o.femalewith medical history significant forobesity with gastric sleeve surgery, GERD, obesity, hypertension, and anxiety who presented to the emergency department after discharge just on 7/14 after hospitalization for gastric sleeve surgery with complication of hemoperitoneum secondary to bleeding blood vessel which was ligated and she required 3 units of PRBCs. She was noted to have stabilization following the procedure, but unfortunately developed worsening shortness of breath and underwent CT scan of her chest that did not demonstrate any pulmonary embolus, however she was noted to have multifocal pneumonia and was started on IV antibiotics (azithromycin and Rocephin). She was subsequently discharged as she was feeling much better and less short of breath. She states that she continued to feel short of breath all day after discharge and went to sleep overnight and awoke with sudden nausea and vomiting with some mild hematemesis noted. She was also noted to have some bleeding from her surgical incision site. She started to have some shortness of breath, but denies any chest pain, coughing, fevers, or chills.CT of the abdomen and pelvis with no reaccumulation of hematoma or acute pathology noted. There are  findings of nonspecific enteritis, however. Patient was subsequently [facility], general surgery consulted.  Discharge Diagnoses:   Postoperative nausea and vomiting in the setting of recent sleeve gastrectomy and open evacuation of hematoma and ligation of vessels 7/8 Prescriptions  58 yo female with obesity presented for sleeve gastrectomy. She underwent procedure and did well. She had increased abdominal pain POD 1. POD 2 she had a hemoglobin drop. She underwent CT showing bleed and was transfused blood and taken to the operating room for exploration. She did well afterward. She required 3 additional units of blood. VTE chemical proph was restarted and she was discharged home with stable Hgb with 2 weeks VTE proph due to increased risk. She had low O2 sats and had lasix to diurese. She had a CT scan performed to r/o PE which was negative but did show multifocal infiltrates. She was treated with antibiotics and improved and was discharged home.  Total Prescriptions: 43   Total Private Pay: 0   Fill Date ID   Written Drug Qty Days Prescriber Rx # Pharmacy Refill   Daily Dose* Pymt Type PMP    07/06/2019  1   07/06/2019  Oxycodone Hcl 10 MG Tablet  30.00  5 Lu Kin   [id number]   Mos (5648)   0  90.00 MME  Comm Ins   [place]  06/18/2019  1   06/08/2019  Hydrocodone-Acetamin 10-325 MG  42.00  14 Ja Gro   [id number]   Car (9744)   0  30.00 MME  Comm Ins   [place]  05/19/2019  1   04/21/2019  Hydrocodone-Acetamin  10-325 MG  90.00  30 Ed Haw   [id number]   Car (9744)   0  30.00 MME  Comm Ins   [place]  05/10/2019  1   05/10/2019  Alprazolam 0.25 MG Tablet  60.00  30 Ed Haw   [id number]   Car (9744)   0  1.00 LME  Comm Ins   [place]  04/21/2019  1   04/12/2019  Hydrocodone-Acetamin 10-325 MG  90.00  30 Ed Haw   [id number]   Car (9744)   0  30.00 MME  Comm Ins   [place]  04/05/2019  1   04/05/2019  Alprazolam 0.5 MG Tablet  60.00  30 Ed Haw   [id number]   Car (9744)   0  2.00 LME  Comm Ins   [place]   03/29/2019  1   03/29/2019  Oxycodone Hcl 5 MG Tablet  20.00  5 Pu Gos   [id number]   Car (9744)   0  30.00 MME  Comm Ins   [place]  03/18/2019  1   03/18/2019  Hydrocodone-Acetamin 10-325 MG  90.00  30 Ed Haw   [id number]   Car (9744)   0  30.00 MME  Comm Ins   [place]  03/02/2019  1   03/02/2019  Alprazolam 0.5 MG Tablet  60.00  30 Ed Haw   [id number]   Car (9744)   0  2.00 LME  Comm Ins   [place]  03/02/2019  1   03/02/2019  Oxycodone Hcl 5 MG Tablet  42.00  7 Je Cho   [id number]   Mos (5648)   0  45.00 MME  Comm Ins   [place]  02/22/2019  1   02/22/2019  Oxycodone Hcl 5 MG Tablet  10.00  3 Lu Kin   [id number]   Car (9744)   0  25.00 MME  Comm Ins   [place]  01/27/2019  1   01/21/2019  Alprazolam 0.5 MG Tablet  60.00  30 Ed Haw   [id number]   Car (9744)   0  2.00 LME  Comm Ins   [place]  12/30/2018  1   12/30/2018  Hydrocodone-Acetamin 10-325 MG  120.00  30 Ed Haw   [id number]   Car (9744)   0  40.00 MME  Comm Ins   [place]  12/30/2018  1   12/18/2018  Alprazolam 0.5 MG Tablet  60.00  30 Ed Haw   [id number]   Car (9744)   0  2.00 LME  Comm Ins   [place]  12/01/2018  1   12/01/2018  Hydrocodone-Acetamin 10-325 MG  120.00  30 Ed Haw   [id number]   Car (9744)   0  40.00 MME  Comm Ins   [place]  11/30/2018  1   09/30/2018  Alprazolam 1 MG Tablet  100.00  34 Ed Haw   [id number]   Car (9744)   0  5.88 LME  Comm Ins   [place]  10/22/2018  1   10/22/2018  Hydrocodone-Acetamin 10-325 MG  120.00  30 Ed Haw   [id number]   Car (9744)   0  40.00 MME  Comm Ins   [place]  10/22/2018  1   10/22/2018  Alprazolam 1 MG Tablet  60.00  30 Ed Haw   [id number]   Car (9744)   0  4.00 LME  Comm Ins     09/07/2018  1   09/07/2018  Hydrocodone-Acetamin 10-325 MG  120.00  30 Ed Haw  [id number]   Car (9744)   0  40.00 MME  Comm Ins   [place]  09/02/2018  1   04/20/2018  Alprazolam 1 MG Tablet  100.00  34 Ed Haw   [id number]   Car (9744)   3  5.88 LME  Comm Ins   [place]    Back pain-pt takes gabapentin/neuro-recent evaluation at pain management-robaxin used for back pain There is no acute bony abnormality of the  lumbar spine. There is mild degenerative facet joint hypertrophy from L3 inferiorly. There is no high-grade disc space narrowing.  Constipation-no recent colonoscopy-takes Linzess-cologuard No EGD prior to   Hyperlipidemia-crestor     Past Medical History:  Diagnosis Date  . Anxiety   . Arthritis    back, hands , knees   . Chest pain 2010   due to sarcoidosis- per pt.   . Depression   . GERD (gastroesophageal reflux disease)   . Hypercholesterolemia   . Hypertension   . Pneumonia    sarcoidosis- in remission   . Postoperative nausea and vomiting 02/24/2019  . Pulmonary sarcoidosis ([place])    Treatment with Prednisone    Past Surgical History:  Procedure Laterality Date  . ABDOMINAL HYSTERECTOMY    . APPENDECTOMY    . CHOLECYSTECTOMY    . HERNIA REPAIR    . INCISIONAL HERNIA REPAIR N/A 07/01/2019   Procedure: OPEN INCISIONAL HERNIA REPAIR WITH MESH;  Surgeon: Kinsinger, Arta Bruce, MD;  Location: [place];  Service: General;  Laterality: N/A;  . INSERTION OF MESH N/A 07/01/2019   Procedure: Insertion Of Mesh;  Surgeon: Kinsinger, Arta Bruce, MD;  Location: [place];  Service: General;  Laterality: N/A;  . LAPAROSCOPIC GASTRIC SLEEVE RESECTION N/A 02/15/2019   Procedure: LAPAROSCOPIC GASTRIC SLEEVE RESECTION, UPPER ENDO, ERAS Pathway, Hiatal Hernia Repair;  Surgeon: Kinsinger, Arta Bruce, MD;  Location: [facility];  Service: General;  Laterality: N/A;  . LAPAROSCOPY N/A 02/17/2019   Procedure: LAPAROSCOPY DIAGNOSTIC CONVERTED TO OPEN, EVACUATION OF HEMATOMA AND LIGATION OF VESSEL;  Surgeon: Kinsinger, Arta Bruce, MD;  Location: [facility];  Service: General;  Laterality: N/A;  . LAPAROSCOPY N/A 07/01/2019   Procedure: Laparoscopy Diagnostic;  Surgeon: Kieth Brightly Arta Bruce, MD;  Location: [place] [facility];  Service: General;  Laterality: N/A;  . SMALL INTESTINE SURGERY      Family History  Problem Relation Age of Onset  . Hypertension Mother   . Hyperlipidemia Mother   . Lupus Father   . Colon  cancer Neg Hx     Social History  Lives with 5 children-2 bio children, [id number]...4yo, 5yo Socioeconomic History  . Marital status: Divorced    Spouse name: Not on file  . Number of children: Not on file  . Years of education: Not on file  . Highest education level: Not on file  Occupational History  . Occupation: foster parent  Social Needs  . Financial resource strain: Not on file  . Food insecurity    Worry: Not on file    Inability: Not on file  . Transportation needs    Medical: Not on file    Non-medical: Not on file  Tobacco Use  . Smoking status: Former Smoker    Packs/day: 0.20    Years: 30.00    Pack years: 6.00    Types: Cigarettes    Quit date: 11/19/2012    Years since quitting: 6.6  . Smokeless tobacco: Never Used  Substance and Sexual Activity  . Alcohol use: No  . Drug use: No  .  Sexual activity: Not Currently    Birth control/protection: Surgical  Lifestyle  . Physical activity    Days per week: Not on file    Minutes per session: Not on file  . Stress: Not on file  Relationships  . Social Musician on phone: Not on file    Gets together: Not on file    Attends religious service: Not on file    Active member of club or organization: Not on file    Attends meetings of clubs or organizations: Not on file    Relationship status: Not on file  . Intimate partner violence    Fear of current or ex partner: Not on file    Emotionally abused: Not on file    Physically abused: Not on file    Forced sexual activity: Not on file  Other Topics Concern  . Not on file  Social History Narrative   No regular exercise    ROS Review of Systems  Eyes: Negative.   Respiratory:       Pulmonary sarcoidosis  Gastrointestinal: Negative.   Endocrine: Negative.   Musculoskeletal: Positive for arthralgias and back pain.  Allergic/Immunologic: Negative.   Psychiatric/Behavioral: The patient is nervous/anxious.     Objective:   Today's Vitals: BP  135/90 (BP Location: Left Arm, Patient Position: Sitting, Cuff Size: Normal)   Pulse 72   Temp 98.3 F (36.8 C) (Oral)   Ht 5' 2.5" (1.588 m)   Wt 212 lb (96.2 kg)   SpO2 98%   BMI 38.16 kg/m   Physical Exam Constitutional:      Appearance: Normal appearance.  HENT:     Head: Normocephalic and atraumatic.  Cardiovascular:     Rate and Rhythm: Normal rate and regular rhythm.     Pulses: Normal pulses.     Heart sounds: Normal heart sounds.  Pulmonary:     Effort: Pulmonary effort is normal.     Breath sounds: Normal breath sounds.  Abdominal:     Hernia: A hernia is present.  Musculoskeletal:     Cervical back: Normal range of motion and neck supple.  Neurological:     Mental Status: She is alert and oriented to person, place, and time.     Assessment & Plan:   1. SARCOIDOSIS, PULMONARY - Ambulatory referral to Pulmonology Current pulmonary doctor retiring 2. Other secondary hypertension Amlodipine-stable - COMPLETE ME-TABOLIC PANEL WITH GFR  3. Other hyperlipidemia crestor-stabe - Lipid panel  4. Encounter for screening mammogram for malignant neoplasm of breast - MM Digital Screening; Future Outpatient Encounter Medications as of 07/21/2019  Medication Sig  . ALPRAZolam (XANAX) 0.5 MG tablet Take 0.5 mg by mouth 2 (two) times daily as needed for anxiety or sleep.   Marland Kitchen amLODipine (NORVASC) 5 MG tablet Take 5 mg by mouth daily.  Marland Kitchen atorvastatin (LIPITOR) 20 MG tablet Take 20 mg by mouth daily.  Marland Kitchen escitalopram (LEXAPRO) 5 MG tablet Take 5 mg by mouth daily.   Marland Kitchen gabapentin (NEURONTIN) 300 MG capsule Take 300 mg by mouth 3 (three) times daily.   Marland Kitchen ibuprofen (ADVIL) 600 MG tablet Take 1 tablet (600 mg total) by mouth every 8 (eight) hours as needed for up to 30 doses.  Marland Kitchen ibuprofen (ADVIL) 800 MG tablet Take 800 mg by mouth 3 (three) times daily.  Marland Kitchen LINZESS 145 MCG CAPS capsule Take 145 mcg by mouth daily as needed (constipation).   . methocarbamol (ROBAXIN) 500 MG  tablet Take 1 tablet (  500 mg total) by mouth every 8 (eight) hours as needed for up to 30 doses for muscle spasms.  . Multiple Vitamins-Minerals (WOMENS 50+ MULTI VITAMIN/MIN PO) Take 1 tablet by mouth daily.  Marland Kitchen oxyCODONE (OXY IR/ROXICODONE) 5 MG immediate release tablet Take 5 mg by mouth every 6 (six) hours as needed.  . Oxycodone HCl 10 MG TABS Take 1 tablet (10 mg total) by mouth every 4 (four) hours as needed (for pain).  . rosuvastatin (CRESTOR) 5 MG tablet Take 5 mg by mouth daily after breakfast.   No facility-administered encounter medications on file as of 07/21/2019.   Follow-up: 6 months LISA Mat Carne, MD

## 2019-08-30 DIAGNOSIS — Z79899 Other long term (current) drug therapy: Secondary | ICD-10-CM | POA: Diagnosis not present

## 2019-08-30 DIAGNOSIS — M5136 Other intervertebral disc degeneration, lumbar region: Secondary | ICD-10-CM | POA: Diagnosis not present

## 2019-08-30 DIAGNOSIS — G894 Chronic pain syndrome: Secondary | ICD-10-CM | POA: Diagnosis not present

## 2019-09-15 ENCOUNTER — Other Ambulatory Visit: Payer: Self-pay | Admitting: General Surgery

## 2019-09-15 DIAGNOSIS — K432 Incisional hernia without obstruction or gangrene: Secondary | ICD-10-CM

## 2019-09-22 ENCOUNTER — Other Ambulatory Visit: Payer: Self-pay

## 2019-09-22 ENCOUNTER — Ambulatory Visit
Admission: RE | Admit: 2019-09-22 | Discharge: 2019-09-22 | Disposition: A | Discharge status: Home / Self Care | Payer: Medicare Other | Source: Ambulatory Visit | Attending: General Surgery | Admitting: General Surgery

## 2019-09-22 DIAGNOSIS — K432 Incisional hernia without obstruction or gangrene: Secondary | ICD-10-CM

## 2019-09-22 DIAGNOSIS — R109 Unspecified abdominal pain: Secondary | ICD-10-CM | POA: Diagnosis not present

## 2019-09-22 MED ORDER — IOPAMIDOL (ISOVUE-300) INJECTION 61%
100.0000 mL | Freq: Once | INTRAVENOUS | Status: AC | PRN
  Administered 2019-09-22: 100 mL via INTRAVENOUS

## 2019-09-28 DIAGNOSIS — M5136 Other intervertebral disc degeneration, lumbar region: Secondary | ICD-10-CM | POA: Diagnosis not present

## 2019-09-28 DIAGNOSIS — Z79899 Other long term (current) drug therapy: Secondary | ICD-10-CM | POA: Diagnosis not present

## 2019-09-28 DIAGNOSIS — G894 Chronic pain syndrome: Secondary | ICD-10-CM | POA: Diagnosis not present

## 2019-10-08 DIAGNOSIS — Z9884 Bariatric surgery status: Secondary | ICD-10-CM | POA: Diagnosis not present

## 2019-10-08 DIAGNOSIS — L929 Granulomatous disorder of the skin and subcutaneous tissue, unspecified: Secondary | ICD-10-CM | POA: Diagnosis not present

## 2019-10-11 ENCOUNTER — Other Ambulatory Visit: Payer: Self-pay | Admitting: Family Medicine

## 2019-10-11 ENCOUNTER — Other Ambulatory Visit: Payer: Self-pay | Admitting: *Deleted

## 2019-10-11 NOTE — Telephone Encounter (Signed)
Please advise 

## 2019-10-11 NOTE — Patient Outreach (Signed)
[name]) Care Management  10/11/2019  [name] [birth year] [id number]   Call placed to member to follow up on recovery from hernia repair and management of hypertension.  She report she is doing well, had follow up appointment with physician last Friday.  She has not had any complications from the surgery.  Report blood pressure has well controlled, last was 117/70, usually range 110s/60-70s.  Denies any urgent needs at this time but she is open to receiving follow up calls for education on HTN.  Will place referral to health coach for ongoing disease management.  [facility] CM Care Plan Problem One     Most Recent Value  Care Plan Problem One  Risk for improper managment of blood pressure related to recent surgery  Role Documenting the Problem One  Care Management [place] for Problem One  Active  [facility] Long Term Goal   Member will report maintaining blood pressure within desired goal range over the next 3 months  [facility] Long Term Goal Start Date  07/13/19  [facility] Long Term Goal Met Date  10/11/19     Valente David, RN, MSN [place] [phone]

## 2019-10-12 ENCOUNTER — Other Ambulatory Visit: Payer: Self-pay | Admitting: *Deleted

## 2019-10-26 ENCOUNTER — Other Ambulatory Visit: Payer: Self-pay | Admitting: *Deleted

## 2019-10-26 DIAGNOSIS — M5136 Other intervertebral disc degeneration, lumbar region: Secondary | ICD-10-CM | POA: Diagnosis not present

## 2019-10-26 DIAGNOSIS — Z79899 Other long term (current) drug therapy: Secondary | ICD-10-CM | POA: Diagnosis not present

## 2019-10-26 DIAGNOSIS — G894 Chronic pain syndrome: Secondary | ICD-10-CM | POA: Diagnosis not present

## 2019-10-26 NOTE — Patient Outreach (Signed)
[facility]) Care Management  10/26/2019  [name] 05/26/1961 [id number]   Unsuccessful outreach attempt made to patient for an initial assessment.  RN Health Coach left HIPAA compliant voicemail message along with her contact information.  Plan: RN Health Coach will call patient within the month of April.  Allison Serve RN, BSN [facility] Care Management  RN Health Coach [phone] [name]

## 2019-11-11 ENCOUNTER — Other Ambulatory Visit: Payer: Self-pay | Admitting: *Deleted

## 2019-11-11 NOTE — Patient Outreach (Signed)
[facility]) Care Management  11/11/2019  [name] [birth year] [id number]   Successful telephone outreach call to patient. HIPAA identifiers obtained. Nurse called to introduce herself as patient's Health Coach for hypertension. Patient explained that her hypertension is in control. She is taking her medications and states she does not have any concerns or questions. Patient does not feel [facility] services would be beneficial at this time. Patient did state that she would contact [facility] if she felt like she wanted to participate in the future.   Plan: Nurse Health Coach will close case.  Blanchie Serve RN, BSN [facility] Care Management  RN Health Coach [phone] [email or web address]

## 2019-11-15 ENCOUNTER — Other Ambulatory Visit: Payer: Self-pay

## 2019-11-15 ENCOUNTER — Ambulatory Visit (INDEPENDENT_AMBULATORY_CARE_PROVIDER_SITE_OTHER): Payer: Medicare Other | Admitting: Family Medicine

## 2019-11-15 ENCOUNTER — Encounter: Payer: Self-pay | Admitting: Family Medicine

## 2019-11-15 VITALS — BP 135/84 | HR 67 | Temp 97.7°F | Wt 204.0 lb

## 2019-11-15 DIAGNOSIS — K432 Incisional hernia without obstruction or gangrene: Secondary | ICD-10-CM

## 2019-11-15 DIAGNOSIS — D869 Sarcoidosis, unspecified: Secondary | ICD-10-CM | POA: Diagnosis not present

## 2019-11-15 NOTE — Patient Instructions (Signed)
Completed license plate paperwork ADL assistance paperwork

## 2019-11-15 NOTE — Progress Notes (Signed)
Established Patient Office Visit  Subjective:  Patient ID: [name], female    DOB: 03-Sep-1960  Age: 59 y.o. MRN: [id number] Total Private Pay: 0   Fill Date ID   Written Drug Qty Days Prescriber Rx # Pharmacy Refill   Daily Dose* Pymt Type PMP    10/29/2019  1   10/26/2019  Hydrocodone-Acetamin 7.5-325  90.00  30 Ja Gro   [id number]   Car (9744)   0  22.50 MME  Comm Ins   [place]  10/08/2019  1   10/08/2019  Oxycodone Hcl 5 MG Tablet  20.00  5 Lu Kin   [id number]   Car (9744)   0  30.00 MME  Comm Ins   [place]  09/29/2019  1   09/28/2019  Hydrocodone-Acetamin 7.5-325  90.00  30 Ja Gro   [id number]   Car (9744)   0  22.50 MME  Comm Ins   [place]  09/15/2019  1   09/15/2019  Oxycodone Hcl 10 MG Tablet  30.00  8 Lu Kin   [id number]   Car (9744)   0  56.25 MME  Comm Ins   [place]  08/30/2019  1   08/30/2019  Hydrocodone-Acetamin 7.5-325  90.00  30 Ja Gro   [id number]   Car (9744)   0  22.50 MME  Comm Ins   [place]  07/29/2019  1   07/29/2019  Hydrocodone-Acetamin 7.5-325  42.00  14 Ja Gro   [id number]   Car (9744)   0  22.50 MME  Comm Ins   [place]  07/06/2019  1   07/06/2019  Oxycodone Hcl 10 MG Tablet  30.00  5 Lu Kin   [id number]   Mos (5648)   0  90.00 MME  Comm Ins   [place]  06/18/2019  1   06/08/2019  Hydrocodone-Acetamin 10-325 MG  42.00  14 Ja Gro   [id number]   Car (9744)   0  30.00 MME  Comm Ins   [place]  05/19/2019  1   04/21/2019  Hydrocodone-Acetamin 10-325 MG  90.00  30 Ed Haw   [id number]   Car (9744)   0  30.00 MME  Comm Ins   [place]  05/10/2019  1   05/10/2019  Alprazolam 0.25 MG Tablet  60.00  30 Ed Haw   [id number]   Car (9744)   0  1.00 LME  Comm Ins   [place]  04/21/2019  1   04/12/2019  Hydrocodone-Acetamin 10-325 MG  90.00  30 Ed Haw   [id number]   Car (9744)   0  30.00 MME  Comm Ins   [place]  04/05/2019  1   04/05/2019  Alprazolam 0.5 MG Tablet  60.00  30 Ed Haw   [id number]   Car (9744)   0  2.00 LME  Comm Ins   [place]  03/29/2019  1   03/29/2019  Oxycodone Hcl 5 MG Tablet  20.00  5 Pu Gos   [id number]   Car (9744)   0  30.00 MME  Comm Ins   [place]    03/18/2019  1   03/18/2019  Hydrocodone-Acetamin 10-325 MG  90.00  30 Ed Haw   [id number]   Car (9744)   0  30.00 MME  Comm Ins     03/02/2019  1   03/02/2019  Alprazolam 0.5 MG Tablet  60.00  30 Ed Haw   [id number]   Car (9744)   0  2.00 LME  Comm Ins  [place]  03/02/2019  1   03/02/2019  Oxycodone Hcl 5 MG Tablet  42.00  7 Je Cho   [id number]   Mos (5648)   0  45.00 MME  Comm Ins   [place]  02/22/2019  1   02/22/2019  Oxycodone Hcl 5 MG Tablet  10.00  3 Lu Kin   [id number]   Car (9744)   0  25.00 MME  Comm Ins   [place]  01/27/2019  1   01/21/2019  Alprazolam 0.5 MG Tablet  60.00  30 Ed Haw   [id number]   Car (9744)   0  2.00 LME  Comm Ins   [place]  12/30/2018  1   12/30/2018  Hydrocodone-Acetamin 10-325 MG  120.00  30 Ed Haw   [id number]   Car (9744)   0  40.00 MME  Comm Ins   [place]  12/30/2018  1   12/18/2018  Alprazolam 0.5 MG Tablet  60.00  30 Ed Haw   [id number]   Car (9744)   0  2.00 LME  Comm Ins   [place]  12/01/2018  1   12/01/2018  Hydrocodone-Acetamin 10-325 MG  120.00  30 Ed Haw   [id number]   Car (9744)   0  40.00 MME  Comm Ins   [place]  11/30/2018  1   09/30/2018  Alprazolam 1 MG Tablet  100.00  34 Ed Haw   [id number]   Car (9744)   0  5.88 LME  Comm Ins   [place]  10/22/2018  1   10/22/2018  Hydrocodone-Acetamin 10-325 MG  120.00  30 Ed Haw   [id number]   Car (9744)   0  40.00 MME  Comm Ins   [place]  10/22/2018  1   10/22/2018  Alprazolam 1 MG Tablet  60.00  30 Ed Haw   [id number]   Car (9744)   0  4.00 LME  Comm Ins   [place]  09/07/2018  1   09/07/2018  Hydrocodone-Acetamin 10-325 MG  120.00  30 Ed Haw   [id number]   Car (9744)   0  40.00 MME  Comm Ins   [place]  09/02/2018  1   04/20/2018  Alprazolam 1 MG Tablet  100.00  34 Ed Haw   [id number]   Car (9744)   3  5.88 LME  Comm Ins   [place]  08/03/2018  1   04/20/2018  Alprazolam 1 MG Tablet  100.00  34 Ed Haw   [id number]   Car (9744)   2  5.88 LME  Comm Ins   [place]  07/31/2018  1   07/23/2018  Hydrocodone-Acetamin 10-325 MG  120.00  30 Ed Haw   [id number]   Car (9744)   0  40.00 MME  Comm Ins   [place]  07/06/2018  1    04/20/2018  Alprazolam 1 MG Tablet  100.00  34 Ed Haw   [id number]   Car (9744)   1  5.88 LME  Comm Ins   [place]  06/16/2018  1   06/10/2018  Hydrocodone-Acetamin 10-325 MG  120.00  30 Ed Haw   [id number]   Car (9744)   0  40.00 MME  Comm Ins   [place]  06/08/2018  1   04/20/2018  Alprazolam 1 MG Tablet  100.00  34 Ed Haw   [id number]   Car (9744)   0  5.88 LME  Comm Ins   [place]  04/30/2018  1   03/03/2018  Hydrocodone-Acetamin 10-325  MG  120.00  30 Ed Haw   [id number]   Car (9744)   0  40.00 MME  Comm Ins   [place]  04/09/2018  1   03/16/2018  Alprazolam 1 MG Tablet  100.00  34 Ed Haw   [id number]   Car (9744)   0  5.88 LME  Comm Ins   [place]  03/31/2018  1   03/03/2018  Hydrocodone-Acetamin 10-325 MG  120.00  30 Ed Haw   [id number]   Car (9744)   0  40.00 MME  Comm Ins   [place]  03/09/2018  1   01/02/2018  Alprazolam 1 MG Tablet  100.00  34 Ed Haw   [id number]   Car (9744)   2  5.88 LME  Comm Ins   [place]  03/03/2018  1   03/03/2018  Hydrocodone-Acetamin 10-325 MG  120.00  30 Ed Haw   [id number]   Car (9744)   0  40.00 MME  Comm Ins   [place]  02/06/2018  1   01/02/2018  Alprazolam 1 MG Tablet  100.00  34 Ed Haw   [id number]   Car (9744)   1  5.88 LME  Comm Ins   [place]  01/28/2018  1   12/02/2017  Hydrocodone-Acetamin 10-325 MG  120.00  30 Ed Haw   [id number]   Car (9744)   0  40.00 MME  Comm Ins  Commercial Insurance coverage   01/02/2018  1   01/02/2018  Alprazolam 1 MG Tablet  100.00  34 Ed Haw   [id number]   Car (1950)   0       CC:  Chief Complaint  Patient presents with  . Follow-up    needs paperwork filled out  pulmonary sarcoid with difficulty -SOB-handicap placard Assistance with ADL in home  HPI Yarelli Decelles presents for pulmonary sarcoidosis-needs paperwork for assistance in home and handicap placard. Pt reports no SOB. Pt has not completed PFT. Pt has not had a follow up pulmonary appointment since Dr. Luan Pulling left. Pt with Bariatric surgery and hernia repair. Pt states tenderness at site of original surgery. Skin changes noted to  palpation. No bowel changes.  Admit date: 07/01/2019 Discharge date: 07/06/2019 Recommendations for Outpatient Follow-up:  1. noen (include homehealth, outpatient follow-up instructions, specific recommendations for PCP to follow-up on, etc.) Discharge Diagnoses:  Active Problems:   Incisional hernia Surgical Procedure: incisional hernia repair with mesh Discharge Condition: Good Disposition: Home Diet recommendation: bariatric diet Hospital Course:  59 yo female with incisional hernia after bariatric surgery presents for repair. She underwent retrorectus mesh repair and was admitted to the surgical floor post op. She had pain issues and was put on a multimodal regimen. She was able to tolerate a diet and ambulate and have return of bowel movements. After 5 days her pain was improved and she was discharged home. Discharge Instructions      Discharge Instructions    Call MD for:  difficulty breathing, headache or visual disturbances   Complete by: As directed    Call MD for:  hives   Complete by: As directed    Call MD for:  persistant nausea and vomiting   Complete by: As directed    Call MD for:  redness, tenderness, or signs of infection (pain, swelling, redness, odor or green/yellow discharge around incision site)   Complete by: As directed    Call MD for:  severe uncontrolled pain   Complete by: As directed    Call  MD for:  temperature >100.4   Complete by: As directed    Diet - low sodium heart healthy   Complete by: As directed    Discharge wound care:   Complete by: As directed    Ok to shower tomorrow. Glue will likely peel off in 1-3 weeks. No bandage required.  Continue drain care, empty 1-2 times a day and when full   Driving Restrictions   Complete by: As directed    No driving while on narcotics   Increase activity slowly   Complete by: As directed    Lifting restrictions   Complete by: As directed    No lifting greater than  20 pounds for 3 weeks          Allergies as of 07/06/2019      Reactions   Aspirin Hives               Medication List        STOP taking these medications       enoxaparin 40 MG/0.4ML injection Commonly known as: LOVENOX   HYDROcodone-acetaminophen 10-325 MG tablet Commonly known as: NORCO         TAKE these medications       ALPRAZolam 0.5 MG tablet Commonly known as: XANAX Take 0.5 mg by mouth 2 (two) times daily as needed for anxiety or sleep.   amLODipine 5 MG tablet Commonly known as: NORVASC Take 5 mg by mouth daily.   escitalopram 5 MG tablet Commonly known as: LEXAPRO Take 5 mg by mouth daily.   gabapentin 300 MG capsule Commonly known as: NEURONTIN Take 300 mg by mouth 3 (three) times daily.   ibuprofen 600 MG tablet Commonly known as: ADVIL Take 1 tablet (600 mg total) by mouth every 8 (eight) hours as needed for up to 30 doses.   Linzess 145 MCG Caps capsule Generic drug: linaclotide Take 145 mcg by mouth daily as needed (constipation).   methocarbamol 500 MG tablet Commonly known as: ROBAXIN Take 1 tablet (500 mg total) by mouth every 8 (eight) hours as needed for up to 30 doses for muscle spasms.   Oxycodone HCl 10 MG Tabs Take 1 tablet (10 mg total) by mouth every 4 (four) hours as needed (for pain).   rosuvastatin 5 MG tablet Commonly known as: CRESTOR Take 5 mg by mouth daily after breakfast.   WOMENS 50+ MULTI VITAMIN/MIN PO Take 1 tablet by mouth daily.                    Discharge Care Instructions  (From admission, onward)                        Start     Ordered    07/06/19 0000  Discharge wound care:    Comments: Ok to shower tomorrow. Glue will likely peel off in 1-3 weeks. No bandage required.  Continue drain care, empty 1-2 times a day and when full   07/06/19 0855          Past Medical History:  Diagnosis Date  . Anxiety   . Arthritis    back, hands , knees   .  Chest pain 2010   due to sarcoidosis- per pt.   . Depression   . GERD (gastroesophageal reflux disease)   . Hypercholesterolemia   . Hypertension   . Pneumonia    sarcoidosis- in remission   . Postoperative nausea and vomiting 02/24/2019  .  Pulmonary sarcoidosis ([place])    Treatment with Prednisone    Past Surgical History:  Procedure Laterality Date  . ABDOMINAL HYSTERECTOMY    . APPENDECTOMY    . CHOLECYSTECTOMY    . HERNIA REPAIR    . INCISIONAL HERNIA REPAIR N/A 07/01/2019   Procedure: OPEN INCISIONAL HERNIA REPAIR WITH MESH;  Surgeon: Kinsinger, Arta Bruce, MD;  Location: [facility];  Service: General;  Laterality: N/A;  . INSERTION OF MESH N/A 07/01/2019   Procedure: Insertion Of Mesh;  Surgeon: Kinsinger, Arta Bruce, MD;  Location: [facility];  Service: General;  Laterality: N/A;  . LAPAROSCOPIC GASTRIC SLEEVE RESECTION N/A 02/15/2019   Procedure: LAPAROSCOPIC GASTRIC SLEEVE RESECTION, UPPER ENDO, ERAS Pathway, Hiatal Hernia Repair;  Surgeon: Kinsinger, Arta Bruce, MD;  Location: [facility];  Service: General;  Laterality: N/A;  . LAPAROSCOPY N/A 02/17/2019   Procedure: LAPAROSCOPY DIAGNOSTIC CONVERTED TO OPEN, EVACUATION OF HEMATOMA AND LIGATION OF VESSEL;  Surgeon: Kinsinger, Arta Bruce, MD;  Location: [facility];  Service: General;  Laterality: N/A;  . LAPAROSCOPY N/A 07/01/2019   Procedure: Laparoscopy Diagnostic;  Surgeon: Kieth Brightly Arta Bruce, MD;  Location: [facility];  Service: General;  Laterality: N/A;  . SMALL INTESTINE SURGERY      Family History  Problem Relation Age of Onset  . Hypertension Mother   . Hyperlipidemia Mother   . Lupus Father   . Colon cancer Neg Hx     Social History   Socioeconomic History  . Marital status: Divorced    Spouse name: Not on file  . Number of children: Not on file  . Years of education: Not on file  . Highest education level: Not on file  Occupational History  . Occupation: foster parent  Tobacco Use  . Smoking status: Former Smoker     Packs/day: 0.20    Years: 30.00    Pack years: 6.00    Types: Cigarettes    Quit date: 11/19/2012    Years since quitting: 6.9  . Smokeless tobacco: Never Used  Substance and Sexual Activity  . Alcohol use: No  . Drug use: No  . Sexual activity: Not Currently    Birth control/protection: Surgical  Other Topics Concern  . Not on file  Social History Narrative   No regular exercise   Social Determinants of Health   Financial Resource Strain:   . Difficulty of Paying Living Expenses:   Food Insecurity:   . Worried About Charity fundraiser in the Last Year:   . Arboriculturist in the Last Year:   Transportation Needs:   . Film/video editor (Medical):   Marland Kitchen Lack of Transportation (Non-Medical):   Physical Activity:   . Days of Exercise per Week:   . Minutes of Exercise per Session:   Stress:   . Feeling of Stress :   Social Connections:   . Frequency of Communication with Friends and Family:   . Frequency of Social Gatherings with Friends and Family:   . Attends Religious Services:   . Active Member of Clubs or Organizations:   . Attends Archivist Meetings:   Marland Kitchen Marital Status:   Intimate Partner Violence:   . Fear of Current or Ex-Partner:   . Emotionally Abused:   Marland Kitchen Physically Abused:   . Sexually Abused:     Outpatient Medications Prior to Visit  Medication Sig Dispense Refill  . amLODipine (NORVASC) 5 MG tablet Take 5 mg by mouth daily.    Marland Kitchen atorvastatin (LIPITOR)  20 MG tablet Take 20 mg by mouth daily.    . cyclobenzaprine (FLEXERIL) 10 MG tablet Take 10 mg by mouth 2 (two) times daily as needed.    . gabapentin (NEURONTIN) 300 MG capsule Take 300 mg by mouth 3 (three) times daily.     Marland Kitchen HYDROcodone-acetaminophen (NORCO) 7.5-325 MG tablet Take 1 tablet by mouth 3 (three) times daily as needed.    Marland Kitchen ibuprofen (ADVIL) 600 MG tablet Take 1 tablet (600 mg total) by mouth every 8 (eight) hours as needed for up to 30 doses. 30 tablet 0  . LINZESS 145 MCG  CAPS capsule Take 145 mcg by mouth daily as needed (constipation).     . methocarbamol (ROBAXIN) 500 MG tablet Take 1 tablet (500 mg total) by mouth every 8 (eight) hours as needed for up to 30 doses for muscle spasms. 30 tablet 0  . Multiple Vitamins-Minerals (WOMENS 50+ MULTI VITAMIN/MIN PO) Take 1 tablet by mouth daily.    Marland Kitchen ALPRAZolam (XANAX) 0.5 MG tablet Take 0.5 mg by mouth 2 (two) times daily as needed for anxiety or sleep.     Marland Kitchen escitalopram (LEXAPRO) 5 MG tablet Take 5 mg by mouth daily.     Marland Kitchen NARCAN 4 MG/0.1ML LIQD nasal spray kit ADMINISTER A SINGLE SPRAYWOF NARCAN INTO ONE NOSTRIL. REPEAT AFTER 3 MINUTES IN OTHER NOSTRIL IF NO RESPONSE.    Marland Kitchen oxyCODONE (OXY IR/ROXICODONE) 5 MG immediate release tablet Take 5 mg by mouth every 6 (six) hours as needed.    . Oxycodone HCl 10 MG TABS Take 1 tablet (10 mg total) by mouth every 4 (four) hours as needed (for pain). (Patient not taking: Reported on 11/15/2019) 30 tablet 0  . rosuvastatin (CRESTOR) 5 MG tablet Take 5 mg by mouth daily after breakfast.    . ibuprofen (ADVIL) 800 MG tablet Take 800 mg by mouth 3 (three) times daily.     No facility-administered medications prior to visit.    Allergies  Allergen Reactions  . Aspirin Hives    ROS Review of Systems  Respiratory: Positive for shortness of breath. Negative for cough and wheezing.   Gastrointestinal: Positive for abdominal pain. Negative for constipation and diarrhea.       Noted associated with surgical site      Objective:    Physical Exam  Constitutional: She is oriented to person, place, and time. She appears well-nourished. No distress.  HENT:  Head: Normocephalic and atraumatic.  Eyes: Conjunctivae are normal.  Cardiovascular: Normal rate, regular rhythm and normal heart sounds.  Pulmonary/Chest: Effort normal and breath sounds normal.  Abdominal: Soft. There is abdominal tenderness.    Adjacent to surgical site-tenderness, no eryth  Neurological: She is alert  and oriented to person, place, and time.  Psychiatric: She has a normal mood and affect. Her behavior is normal.    BP 135/84 (BP Location: Right Arm, Patient Position: Sitting)   Pulse 67   Temp 97.7 F (36.5 C) (Temporal)   Wt 204 lb (92.5 kg)   SpO2 97%   BMI 36.72 kg/m  Wt Readings from Last 3 Encounters:  11/15/19 204 lb (92.5 kg)  07/21/19 212 lb (96.2 kg)  07/01/19 215 lb 4 oz (97.6 kg)     Health Maintenance Due  Topic Date Due  . Hepatitis C Screening  Never done  . TETANUS/TDAP  Never done  . PAP SMEAR-Modifier  Never done  . COLONOSCOPY  Never done  . MAMMOGRAM  11/11/2019  Lab Results  Component Value Date   TSH 5.27 02/22/2017   Lab Results  Component Value Date   WBC 5.0 07/06/2019   HGB 8.6 (L) 07/06/2019   HCT 27.0 (L) 07/06/2019   MCV 83.1 07/06/2019   PLT 251 07/06/2019   Lab Results  Component Value Date   NA 138 07/06/2019   K 4.3 07/06/2019   CO2 23 07/06/2019   GLUCOSE 99 07/06/2019   BUN 16 07/06/2019   CREATININE 0.89 07/06/2019   BILITOT 0.8 02/25/2019   ALKPHOS 60 02/25/2019   AST 11 (L) 02/25/2019   ALT 29 02/25/2019   PROT 6.2 (L) 02/25/2019   ALBUMIN 1.9 (L) 02/25/2019   CALCIUM 8.8 (L) 07/06/2019   ANIONGAP 9 07/06/2019   Lab Results  Component Value Date   CHOL 252 (A) 02/22/2017   Lab Results  Component Value Date   HDL 31 (A) 02/22/2017   Lab Results  Component Value Date   LDLCALC 155 04/03/2009   Lab Results  Component Value Date   TRIG 407 (A) 02/22/2017     Assessment & Plan:  1. Sarcoidosis Pt with no recent PFT-needs follow up on current status-signed paperwork for disability placard due to pulmonary for parking  sarcoid - Ambulatory referral to Pulmonology-no recent PFT  2. Incisional hernia, without obstruction or gangrene Pt with repair-continued tenderness at surgical site-advised if change in skin color, pain or other concerns-notify surgery or ER for evaluation Follow-up:    Jolaine Fryberger Hannah Beat, MD

## 2019-11-17 ENCOUNTER — Telehealth: Payer: Self-pay | Admitting: Family Medicine

## 2019-11-17 NOTE — Telephone Encounter (Signed)
Recv'd medical records from [facility]  forwarded via fax 2 pages to Dr. Nida Boatman [facility] 4/7/21fbg

## 2019-11-26 DIAGNOSIS — M5136 Other intervertebral disc degeneration, lumbar region: Secondary | ICD-10-CM | POA: Diagnosis not present

## 2019-11-26 DIAGNOSIS — Z79899 Other long term (current) drug therapy: Secondary | ICD-10-CM | POA: Diagnosis not present

## 2019-11-26 DIAGNOSIS — G894 Chronic pain syndrome: Secondary | ICD-10-CM | POA: Diagnosis not present

## 2019-12-08 ENCOUNTER — Other Ambulatory Visit: Payer: Self-pay | Admitting: Family Medicine

## 2019-12-15 DIAGNOSIS — Z9884 Bariatric surgery status: Secondary | ICD-10-CM | POA: Diagnosis not present

## 2019-12-15 DIAGNOSIS — R69 Illness, unspecified: Secondary | ICD-10-CM | POA: Diagnosis not present

## 2019-12-15 DIAGNOSIS — Z8719 Personal history of other diseases of the digestive system: Secondary | ICD-10-CM | POA: Diagnosis not present

## 2019-12-15 DIAGNOSIS — K912 Postsurgical malabsorption, not elsewhere classified: Secondary | ICD-10-CM | POA: Diagnosis not present

## 2019-12-15 DIAGNOSIS — E669 Obesity, unspecified: Secondary | ICD-10-CM | POA: Diagnosis not present

## 2019-12-16 ENCOUNTER — Ambulatory Visit (INDEPENDENT_AMBULATORY_CARE_PROVIDER_SITE_OTHER): Payer: Medicare Other | Admitting: Internal Medicine

## 2019-12-16 ENCOUNTER — Encounter: Payer: Self-pay | Admitting: Internal Medicine

## 2019-12-16 ENCOUNTER — Other Ambulatory Visit: Payer: Self-pay

## 2019-12-16 DIAGNOSIS — D869 Sarcoidosis, unspecified: Secondary | ICD-10-CM

## 2019-12-16 NOTE — Patient Instructions (Addendum)
Weight control is simply a matter of calorie balance which needs to be tilted in your favor by eating less and exercising more.  To get the most out of exercise, you need to be continuously aware that you are short of breath, but never out of breath, for 30 minutes daily. As you improve, it will actually be easier for you to do the same amount of exercise  in  30 minutes so always push to the level where you are short of breath.    If there is a concern about your breathing> Make sure you check your oxygen saturations at highest level of activity to be sure it stays over 90%  (by pulse oximeter)   Please remember to go to the  x-ray department  for your tests - we will call you with the results when they are available    Pulmonary follow up is as needed

## 2019-12-16 NOTE — Progress Notes (Signed)
[name], female    DOB: [birth year]    MRN: [id number]   Brief patient profile:  74 yobf quit smoking 2014 developed sob in [place] around 1990 dx with FOB / ? tbbx with sarcoid > worse around 2014 > [place] >  50% better on up to 60 mg Prednisone weaned off over several years and "gained about 100 lb" and had gastric bypass July 7564 at [facility] complicated by post hosp resp fiailure and lost about 30 lb and referred back to pulmonary clinic 12/16/2019 by Dr  [name]     History of Present Illness  12/16/2019  Pulmonary/ 1st office eval/[name] f/u sarcoid  - s/p second shot covid 19 moderna Chief Complaint  Patient presents with  . Consult   Dyspnea:  Housework causes her to feek tired > sob / has treadmill did 30 min 3-26mh flat s stopping one day prior to OV   Cough: none  Sleep: on side/ one pillow bed flat  SABA use: none Arthritis knees / back >> hands and not ankles  No obvious day to day or daytime variability or assoc excess/ purulent sputum or mucus plugs or hemoptysis or cp or chest tightness, subjective wheeze or overt sinus or hb symptoms.   Sleeping  without nocturnal  or early am exacerbation  of respiratory  c/o's or need for noct saba. Also denies any obvious fluctuation of symptoms with weather or environmental changes or other aggravating or alleviating factors except as outlined above   No unusual exposure hx or h/o childhood pna/ asthma or knowledge of premature birth.  Current Allergies, Complete Past Medical History, Past Surgical History, Family History, and Social History were reviewed in [employer].  ROS  The following are not active complaints unless bolded Hoarseness, sore throat, dysphagia, dental problems, itching, sneezing,  nasal congestion or discharge of excess mucus or purulent secretions, ear ache,   fever, chills, sweats, unintended wt loss/intentional or wt gain, classically pleuritic or exertional cp,  orthopnea pnd or  arm/hand swelling  or leg swelling, presyncope, palpitations, abdominal pain, anorexia, nausea, vomiting, diarrhea  or change in bowel habits or change in bladder habits, change in stools or change in urine, dysuria, hematuria,  rash, arthralgias, visual complaints, headache, numbness, weakness or ataxia or problems with walking or coordination,  change in mood or  memory.           Past Medical History:  Diagnosis Date  . Anxiety   . Arthritis    back, hands , knees   . Chest pain 2010   due to sarcoidosis- per pt.   . Depression   . GERD (gastroesophageal reflux disease)   . Hypercholesterolemia   . Hypertension   . Pneumonia    sarcoidosis- in remission   . Postoperative nausea and vomiting 02/24/2019  . [place]    Treatment with Prednisone    Outpatient Medications Prior to Visit  Medication Sig Dispense Refill  . ALPRAZolam (XANAX) 0.5 MG tablet Take 0.5 mg by mouth 2 (two) times daily as needed for anxiety or sleep.     .Marland KitchenamLODipine (NORVASC) 5 MG tablet Take 5 mg by mouth daily.    .Marland Kitchenatorvastatin (LIPITOR) 20 MG tablet Take 20 mg by mouth daily.    . cyclobenzaprine (FLEXERIL) 10 MG tablet Take 10 mg by mouth 2 (two) times daily as needed.    .Marland Kitchenescitalopram (LEXAPRO) 5 MG tablet Take 5 mg by mouth daily.     .Marland Kitchen  gabapentin (NEURONTIN) 300 MG capsule Take 300 mg by mouth 3 (three) times daily.     Marland Kitchen HYDROcodone-acetaminophen (NORCO) 7.5-325 MG tablet Take 1 tablet by mouth 3 (three) times daily as needed.    Marland Kitchen ibuprofen (ADVIL) 600 MG tablet Take 1 tablet (600 mg total) by mouth every 8 (eight) hours as needed for up to 30 doses. 30 tablet 0  . LINZESS 145 MCG CAPS capsule Take 145 mcg by mouth daily as needed (constipation).     . methocarbamol (ROBAXIN) 500 MG tablet Take 1 tablet (500 mg total) by mouth every 8 (eight) hours as needed for up to 30 doses for muscle spasms. 30 tablet 0  . Multiple Vitamins-Minerals (WOMENS 50+ MULTI VITAMIN/MIN PO) Take 1  tablet by mouth daily.    Marland Kitchen NARCAN 4 MG/0.1ML LIQD nasal spray kit ADMINISTER A SINGLE SPRAYWOF NARCAN INTO ONE NOSTRIL. REPEAT AFTER 3 MINUTES IN OTHER NOSTRIL IF NO RESPONSE.    Marland Kitchen oxyCODONE (OXY IR/ROXICODONE) 5 MG immediate release tablet Take 5 mg by mouth every 6 (six) hours as needed.    . Oxycodone HCl 10 MG TABS Take 1 tablet (10 mg total) by mouth every 4 (four) hours as needed (for pain). 30 tablet 0  . rosuvastatin (CRESTOR) 5 MG tablet Take 5 mg by mouth daily after breakfast.        Objective:     BP 130/80 (BP Location: Left Arm, Cuff Size: Large)   Pulse 77   Temp (!) 97.4 F (36.3 C) (Temporal)   Ht '5\' 2"'$  (1.575 m)   Wt 202 lb (91.6 kg)   SpO2 100%   BMI 36.95 kg/m   SpO2: 100 %   amb obese bf nad   HEENT : pt wearing mask not removed for exam due to covid -19 concerns.    NECK :  without JVD/Nodes/TM/ nl carotid upstrokes bilaterally   LUNGS: no acc muscle use,  Nl contour chest which is clear to A and P bilaterally without cough on insp or exp maneuvers   CV:  RRR  no s3 or murmur or increase in P2, and no edema   ABD:  soft and nontender with nl inspiratory excursion in the supine position. No bruits or organomegaly appreciated, bowel sounds nl  MS:  Nl gait/ ext warm without deformities, calf tenderness, cyanosis or clubbing No obvious joint restrictions   SKIN: warm and dry without lesions    NEURO:  alert, approp, nl sensorium with  no motor or cerebellar deficits apparent.     CXR PA and Lateral:   12/16/2019 :    I personally reviewed images and agree with radiology impression as follows:    >>> Did not go for cxr as rec    I personally reviewed images and agree with radiology impression as follows:   Chest CTa 02/23/2019 IMPRESSION: 1. Evaluation for pulmonary emboli is limited due to respiratory motion. No central emboli identified. 2. Bilateral pulmonary opacities most prominent the right middle lobe and bases. These findings could  represent significant atelectasis versus multifocal pneumonia. Recommend clinical correlation. 3. 7 mm nodule in the left apex. Whether this is a true nodule or part of the streaky opacities in the apices is unclear. Non-contrast chest CT at 6-12 months is recommended. If the nodule is stable at time of repeat CT, then future CT at 18-24 months (from today's scan) is considered optional for low-risk patients      Assessment   SARCOIDOSIS, PULMONARY Developed sob  in [place] around 1990 dx with FOB / ? tbbx with sarcoid > worse around 2014 > [place] >  50% better on up to 60 mg Prednisone weaned off over several years - no convincing evidence of active sarcoid on CTa 02/23/2019 done post op but clearly not normal and also additional 7 mm nodule detected in this former smoker so reasonable to rec repeat CT s contrast at the one year mark = 02/23/20 and fu prn   She has no clinical evidence of sarcoid and best way to monitor for recurrence is regular aerobic ex, monitoring sats occasionally at peak levels of exertion to see if any trend downward.  Also advised yearly opth eval important as well in this setting.      Discussed in detail all the  indications, usual  risks and alternatives  relative to the benefits with patient who agrees to proceed with w/u as outlined.     Each maintenance medication was reviewed in detail including emphasizing most importantly the difference between maintenance and prns and under what circumstances the prns are to be triggered using an action plan format where appropriate.  Total time for H and P, chart review, counseling, teaching device and generating customized AVS unique to this office visit / charting = 45 min       Christinia Gully, MD 12/16/2019

## 2019-12-19 ENCOUNTER — Encounter: Payer: Self-pay | Admitting: Internal Medicine

## 2019-12-19 NOTE — Assessment & Plan Note (Addendum)
Developed sob in [place] around 1990 dx with FOB / ? tbbx with sarcoid > worse around 2014 > [place] >  50% better on up to 60 mg Prednisone weaned off over several years - no convincing evidence of active sarcoid on CTa 02/23/2019 done post op but clearly not normal and also additional 7 mm nodule detected in this former smoker so reasonable to rec repeat CT s contrast at the one year mark = 02/23/20 and fu prn   She has no clinical evidence of sarcoid and best way to monitor for recurrence is regular aerobic ex, monitoring sats occasionally at peak levels of exertion to see if any trend downward.  Also advised yearly opth eval important as well in this setting.      Discussed in detail all the  indications, usual  risks and alternatives  relative to the benefits with patient who agrees to proceed with w/u as outlined.     Each maintenance medication was reviewed in detail including emphasizing most importantly the difference between maintenance and prns and under what circumstances the prns are to be triggered using an action plan format where appropriate.  Total time for H and P, chart review, counseling, teaching device and generating customized AVS unique to this office visit / charting = 45 min

## 2019-12-21 ENCOUNTER — Telehealth: Payer: Self-pay | Admitting: *Deleted

## 2019-12-21 DIAGNOSIS — D869 Sarcoidosis, unspecified: Secondary | ICD-10-CM

## 2019-12-21 NOTE — Telephone Encounter (Signed)
Allison Cowden, MD sent to [name], CMA  Let her know p chart review she doesn't really need a cxr at this point but prefer she get a non contrasted CT chest at the one year anniversary of her prior ct = 02/23/20   Pt aware and CT ordered

## 2019-12-27 DIAGNOSIS — M5136 Other intervertebral disc degeneration, lumbar region: Secondary | ICD-10-CM | POA: Diagnosis not present

## 2019-12-27 DIAGNOSIS — I10 Essential (primary) hypertension: Secondary | ICD-10-CM | POA: Diagnosis not present

## 2019-12-27 DIAGNOSIS — Z79899 Other long term (current) drug therapy: Secondary | ICD-10-CM | POA: Diagnosis not present

## 2019-12-27 DIAGNOSIS — G894 Chronic pain syndrome: Secondary | ICD-10-CM | POA: Diagnosis not present

## 2020-01-05 ENCOUNTER — Other Ambulatory Visit: Payer: Self-pay | Admitting: Family Medicine

## 2020-01-05 NOTE — Telephone Encounter (Signed)
Please advise, I don't see where you prescribed.

## 2020-02-04 ENCOUNTER — Ambulatory Visit (INDEPENDENT_AMBULATORY_CARE_PROVIDER_SITE_OTHER): Payer: Medicare Other | Admitting: Plastic Surgery

## 2020-02-04 ENCOUNTER — Encounter: Payer: Self-pay | Admitting: Plastic Surgery

## 2020-02-04 ENCOUNTER — Other Ambulatory Visit: Payer: Self-pay

## 2020-02-04 VITALS — BP 135/90 | HR 71 | Temp 97.3°F | Ht 61.0 in | Wt 202.8 lb

## 2020-02-04 DIAGNOSIS — M7989 Other specified soft tissue disorders: Secondary | ICD-10-CM | POA: Diagnosis not present

## 2020-02-04 NOTE — Progress Notes (Signed)
Referring Provider Corum, Rex Kras, MD [address] [place],  [place] [address]   CC:  Chief Complaint  Patient presents with   Consult      [name] is an 59 y.o. female.  HPI: Patient presents to discuss a painful mass in her abdominal wall.  She had a laparoscopic gastric sleeve done about a year ago.  She had some bleeding postoperatively that required an open procedure to evacuate the blood clot and stop the bleeding.  She subsequently developed an incisional midline upper abdominal hernia that was fixed this past November which was about 6 months out from the index operation.  This operation started laparoscopically and then converted to open due to the lysis of adhesions that was required.  A retrorectus Prolene mesh was placed along with primary closure of the posterior and anterior rectus sheaths.  Subsequent to that she developed firm painful swelling to the left of her scar in the midline which is why she is here today.  She has had a CT scan which showed no hernia recurrence but significant stranding in the area of concern.  It bothers her when she is doing activities and wants to come up with a solution.  Allergies  Allergen Reactions   Aspirin Hives    Outpatient Encounter Medications as of 02/04/2020  Medication Sig   ALPRAZolam (XANAX) 0.5 MG tablet Take 0.5 mg by mouth 2 (two) times daily as needed for anxiety or sleep.    amLODipine (NORVASC) 5 MG tablet Take 5 mg by mouth daily.   atorvastatin (LIPITOR) 20 MG tablet Take 20 mg by mouth daily.   cyclobenzaprine (FLEXERIL) 10 MG tablet Take 10 mg by mouth 2 (two) times daily as needed.   escitalopram (LEXAPRO) 5 MG tablet Take 5 mg by mouth daily.    gabapentin (NEURONTIN) 300 MG capsule Take 300 mg by mouth 3 (three) times daily.    HYDROcodone-acetaminophen (NORCO) 7.5-325 MG tablet Take 1 tablet by mouth 3 (three) times daily as needed.   ibuprofen (ADVIL) 600 MG tablet Take 1 tablet (600 mg total) by  mouth every 8 (eight) hours as needed for up to 30 doses.   LINZESS 145 MCG CAPS capsule Take 145 mcg by mouth daily as needed (constipation).    methocarbamol (ROBAXIN) 500 MG tablet Take 1 tablet (500 mg total) by mouth every 8 (eight) hours as needed for up to 30 doses for muscle spasms.   Multiple Vitamins-Minerals (WOMENS 50+ MULTI VITAMIN/MIN PO) Take 1 tablet by mouth daily.   NARCAN 4 MG/0.1ML LIQD nasal spray kit ADMINISTER A SINGLE SPRAYWOF NARCAN INTO ONE NOSTRIL. REPEAT AFTER 3 MINUTES IN OTHER NOSTRIL IF NO RESPONSE.   oxyCODONE (OXY IR/ROXICODONE) 5 MG immediate release tablet Take 5 mg by mouth every 6 (six) hours as needed.   Oxycodone HCl 10 MG TABS Take 1 tablet (10 mg total) by mouth every 4 (four) hours as needed (for pain).   rosuvastatin (CRESTOR) 5 MG tablet Take 5 mg by mouth daily after breakfast.   No facility-administered encounter medications on file as of 02/04/2020.     Past Medical History:  Diagnosis Date   Anxiety    Arthritis    back, hands , knees    Chest pain 2010   due to sarcoidosis- per pt.    Depression    GERD (gastroesophageal reflux disease)    Hypercholesterolemia    Hypertension    Pneumonia    sarcoidosis- in remission  Postoperative nausea and vomiting 02/24/2019   Pulmonary sarcoidosis ([place])    Treatment with Prednisone    Past Surgical History:  Procedure Laterality Date   ABDOMINAL HYSTERECTOMY     APPENDECTOMY     CHOLECYSTECTOMY     HERNIA REPAIR     INCISIONAL HERNIA REPAIR N/A 07/01/2019   Procedure: OPEN INCISIONAL HERNIA REPAIR WITH MESH;  Surgeon: Kinsinger, Arta Bruce, MD;  Location: [facility];  Service: General;  Laterality: N/A;   INSERTION OF MESH N/A 07/01/2019   Procedure: Insertion Of Mesh;  Surgeon: Kinsinger, Arta Bruce, MD;  Location: [facility];  Service: General;  Laterality: N/A;   LAPAROSCOPIC GASTRIC SLEEVE RESECTION N/A 02/15/2019   Procedure: LAPAROSCOPIC GASTRIC SLEEVE RESECTION, UPPER  ENDO, ERAS Pathway, Hiatal Hernia Repair;  Surgeon: Kinsinger, Arta Bruce, MD;  Location: [facility];  Service: General;  Laterality: N/A;   LAPAROSCOPY N/A 02/17/2019   Procedure: LAPAROSCOPY DIAGNOSTIC CONVERTED TO OPEN, EVACUATION OF HEMATOMA AND LIGATION OF VESSEL;  Surgeon: Kinsinger, Arta Bruce, MD;  Location: [facility];  Service: General;  Laterality: N/A;   LAPAROSCOPY N/A 07/01/2019   Procedure: Laparoscopy Diagnostic;  Surgeon: Mickeal Skinner, MD;  Location: [facility];  Service: General;  Laterality: N/A;   SMALL INTESTINE SURGERY      Family History  Problem Relation Age of Onset   Hypertension Mother    Hyperlipidemia Mother    Lupus Father    Colon cancer Neg Hx     Social History   Social History Narrative   No regular exercise  Denies tobacco use  Review of Systems General: Denies fevers, chills, weight loss CV: Denies chest pain, shortness of breath, palpitations  Physical Exam Vitals with BMI 02/04/2020 12/16/2019 11/15/2019  Height '5\' 1"'$  '5\' 2"'$  -  Weight 202 lbs 13 oz 202 lbs 204 lbs  BMI 32.95 18.84 -  Systolic 166 063 016  Diastolic 90 80 84  Pulse 71 77 67    General:  No acute distress,  Alert and oriented, Non-Toxic, Normal speech and affect Examination of the abdomen shows an upper midline scar along with several laparoscopic port scars.  At the midportion of her midline incision extending to the left there is a 4 to 5 cm area of firmness that is tender.  There is no overlying skin changes.  I cannot definitively identify any hernias.  CT scan: CT scan from mid February shows maintained closure of the fascia and hernia repair.  In the area of concern there is some swelling and fat stranding.  Assessment/Plan Patient presents with what I suspect is fat necrosis adjacent to her incision that occurred after incisional hernia repair.  I explained that I do not think that this will get better on its own and the only solution I could think of was to excise it.  I  discussed the risks of excision include bleeding, infection, damage to surrounding structures, need for additional procedures.  I do not think this would affect her hernia at all as the retrorectus mesh location should keep it well out of the surgical field.  I did explain she would have a transverse or oblique scar superficial to the area that is being excised.  I explained the potential for use of drains during surgery.  I explained the potential for persistent pain in this area even after removal of the mass.  She is understanding of all this and will plan to proceed with excision of this in the operating room.  Cindra Presume  02/04/2020, 3:27 PM

## 2020-02-21 ENCOUNTER — Ambulatory Visit
Admission: RE | Admit: 2020-02-21 | Discharge: 2020-02-21 | Disposition: A | Discharge status: Home / Self Care | Payer: Medicare Other | Source: Ambulatory Visit | Attending: Internal Medicine | Admitting: Internal Medicine

## 2020-02-21 ENCOUNTER — Other Ambulatory Visit: Payer: Self-pay

## 2020-02-21 DIAGNOSIS — D869 Sarcoidosis, unspecified: Secondary | ICD-10-CM

## 2020-02-21 NOTE — Progress Notes (Signed)
LMTCB x 1 

## 2020-02-22 ENCOUNTER — Telehealth: Payer: Self-pay | Admitting: Internal Medicine

## 2020-02-22 DIAGNOSIS — K219 Gastro-esophageal reflux disease without esophagitis: Secondary | ICD-10-CM

## 2020-02-22 NOTE — Telephone Encounter (Signed)
Referral to GI in [place] placed, patient contacted.

## 2020-02-22 NOTE — Telephone Encounter (Signed)
Allison Cowden, MD  02/21/2020 10:38 AM EDT     Call patient : Study overall improved which is good - there is the one single nodule that remains and due to smoking hx readiology recs one single f/u in 1 year to close the loop > I placed in reminder file for mid July 2022  Copy to PCP   Called and spoke with pt letting her know the results of the  CT scan and she verbalized understanding. Pt stated that her stomach is still really bothering her and wants to know what could be recommended. Dr. Sherene Sires, please advise.

## 2020-02-22 NOTE — Telephone Encounter (Signed)
Refer to GI in [place]

## 2020-02-24 ENCOUNTER — Encounter: Payer: Self-pay | Admitting: Internal Medicine

## 2020-02-29 DIAGNOSIS — M5136 Other intervertebral disc degeneration, lumbar region: Secondary | ICD-10-CM | POA: Diagnosis not present

## 2020-02-29 DIAGNOSIS — G894 Chronic pain syndrome: Secondary | ICD-10-CM | POA: Diagnosis not present

## 2020-02-29 DIAGNOSIS — Z79899 Other long term (current) drug therapy: Secondary | ICD-10-CM | POA: Diagnosis not present

## 2020-03-07 DIAGNOSIS — I10 Essential (primary) hypertension: Secondary | ICD-10-CM | POA: Diagnosis not present

## 2020-03-07 DIAGNOSIS — E039 Hypothyroidism, unspecified: Secondary | ICD-10-CM | POA: Diagnosis not present

## 2020-03-07 DIAGNOSIS — E7800 Pure hypercholesterolemia, unspecified: Secondary | ICD-10-CM | POA: Diagnosis not present

## 2020-03-07 DIAGNOSIS — E559 Vitamin D deficiency, unspecified: Secondary | ICD-10-CM | POA: Diagnosis not present

## 2020-03-07 DIAGNOSIS — Z1331 Encounter for screening for depression: Secondary | ICD-10-CM | POA: Diagnosis not present

## 2020-03-07 DIAGNOSIS — Z1339 Encounter for screening examination for other mental health and behavioral disorders: Secondary | ICD-10-CM | POA: Diagnosis not present

## 2020-03-08 ENCOUNTER — Telehealth: Payer: Self-pay | Admitting: Plastic Surgery

## 2020-03-08 NOTE — Telephone Encounter (Signed)
Left message on patient's voicemail 7/12 and 7/28 for patient to return call. Attempt was to schedule surgery.

## 2020-03-09 ENCOUNTER — Ambulatory Visit (INDEPENDENT_AMBULATORY_CARE_PROVIDER_SITE_OTHER): Payer: Medicare Other | Admitting: Surgical

## 2020-03-09 ENCOUNTER — Encounter: Payer: Self-pay | Admitting: Surgical

## 2020-03-09 ENCOUNTER — Other Ambulatory Visit: Payer: Self-pay

## 2020-03-09 VITALS — BP 144/82 | HR 79 | Temp 98.5°F | Wt 204.2 lb

## 2020-03-09 DIAGNOSIS — M7989 Other specified soft tissue disorders: Secondary | ICD-10-CM

## 2020-03-09 NOTE — Progress Notes (Signed)
Patient ID: [name], female    DOB: April 24, 1961, 59 y.o.   MRN: [id number]  Chief Complaint  Patient presents with  . Pre-op Exam       ICD-10-CM   1. Fat necrosis  M79.89     History of Present Illness: [name] is a 59 y.o.  female  with a history of laparoscopic gastric sleeve, lysis of adhesions, upper abdominal hernia repair and a resulting mass in her midline abdomen that bothers her with activities.  She presents for preoperative evaluation for upcoming procedure, excision of anterior abdominal wall fat necrosis, scheduled for 03/20/20 with Dr. Claudia Desanctis  The patient has not had problems with anesthesia. No history of DVT/PE.  No family history of DVT/PE.  No family or personal history of bleeding or clotting disorders.  Patient is not currently taking any blood thinners.  No history of CVA/MI.   PMH Significant for: anxiety, sarcoidosis, arthritis, HTN, Hypercholesterolemia, PONV, GERD.  Patient has seen pulmonology, most recent visit 12/16/19, recent PFTs improved per EMR review. Patient reports no SOB, CP, Dizziness, weakness, recent f/c/n/v.  Past Medical History: Allergies: Allergies  Allergen Reactions  . Aspirin Hives    Current Medications:  Current Outpatient Medications:  .  amLODipine (NORVASC) 5 MG tablet, Take 5 mg by mouth daily., Disp: , Rfl:  .  atorvastatin (LIPITOR) 20 MG tablet, Take 20 mg by mouth daily., Disp: , Rfl:  .  cyclobenzaprine (FLEXERIL) 10 MG tablet, Take 10 mg by mouth 2 (two) times daily as needed., Disp: , Rfl:  .  escitalopram (LEXAPRO) 5 MG tablet, Take 5 mg by mouth daily. , Disp: , Rfl:  .  gabapentin (NEURONTIN) 300 MG capsule, Take 300 mg by mouth 3 (three) times daily. , Disp: , Rfl:  .  HYDROcodone-acetaminophen (NORCO) 7.5-325 MG tablet, Take 1 tablet by mouth 3 (three) times daily as needed., Disp: , Rfl:  .  LINZESS 145 MCG CAPS capsule, Take 145 mcg by mouth daily as needed (constipation). , Disp: , Rfl:  .   methocarbamol (ROBAXIN) 500 MG tablet, Take 1 tablet (500 mg total) by mouth every 8 (eight) hours as needed for up to 30 doses for muscle spasms., Disp: 30 tablet, Rfl: 0 .  Multiple Vitamins-Minerals (WOMENS 50+ MULTI VITAMIN/MIN PO), Take 1 tablet by mouth daily., Disp: , Rfl:  .  rosuvastatin (CRESTOR) 5 MG tablet, Take 5 mg by mouth daily after breakfast., Disp: , Rfl:  .  ALPRAZolam (XANAX) 0.5 MG tablet, Take 0.5 mg by mouth 2 (two) times daily as needed for anxiety or sleep.  (Patient not taking: Reported on 03/09/2020), Disp: , Rfl:  .  ibuprofen (ADVIL) 600 MG tablet, Take 1 tablet (600 mg total) by mouth every 8 (eight) hours as needed for up to 30 doses. (Patient not taking: Reported on 03/09/2020), Disp: 30 tablet, Rfl: 0 .  NARCAN 4 MG/0.1ML LIQD nasal spray kit, ADMINISTER A SINGLE SPRAYWOF NARCAN INTO ONE NOSTRIL. REPEAT AFTER 3 MINUTES IN OTHER NOSTRIL IF NO RESPONSE. (Patient not taking: Reported on 03/09/2020), Disp: , Rfl:  .  oxyCODONE (OXY IR/ROXICODONE) 5 MG immediate release tablet, Take 5 mg by mouth every 6 (six) hours as needed. (Patient not taking: Reported on 03/09/2020), Disp: , Rfl:  .  Oxycodone HCl 10 MG TABS, Take 1 tablet (10 mg total) by mouth every 4 (four) hours as needed (for pain). (Patient not taking: Reported on 03/09/2020), Disp: 30 tablet, Rfl: 0  Past Medical Problems:  Past Medical History:  Diagnosis Date  . Anxiety   . Arthritis    back, hands , knees   . Chest pain 2010   due to sarcoidosis- per pt.   . Depression   . GERD (gastroesophageal reflux disease)   . Hypercholesterolemia   . Hypertension   . Pneumonia    sarcoidosis- in remission   . Postoperative nausea and vomiting 02/24/2019  . Pulmonary sarcoidosis ([place])    Treatment with Prednisone    Past Surgical History: Past Surgical History:  Procedure Laterality Date  . ABDOMINAL HYSTERECTOMY    . APPENDECTOMY    . CHOLECYSTECTOMY    . HERNIA REPAIR    . INCISIONAL HERNIA REPAIR N/A  07/01/2019   Procedure: OPEN INCISIONAL HERNIA REPAIR WITH MESH;  Surgeon: Kinsinger, Arta Bruce, MD;  Location: [place];  Service: General;  Laterality: N/A;  . INSERTION OF MESH N/A 07/01/2019   Procedure: Insertion Of Mesh;  Surgeon: Kinsinger, Arta Bruce, MD;  Location: [place];  Service: General;  Laterality: N/A;  . LAPAROSCOPIC GASTRIC SLEEVE RESECTION N/A 02/15/2019   Procedure: LAPAROSCOPIC GASTRIC SLEEVE RESECTION, UPPER ENDO, ERAS Pathway, Hiatal Hernia Repair;  Surgeon: Kinsinger, Arta Bruce, MD;  Location: [facility];  Service: General;  Laterality: N/A;  . LAPAROSCOPY N/A 02/17/2019   Procedure: LAPAROSCOPY DIAGNOSTIC CONVERTED TO OPEN, EVACUATION OF HEMATOMA AND LIGATION OF VESSEL;  Surgeon: Kinsinger, Arta Bruce, MD;  Location: [facility];  Service: General;  Laterality: N/A;  . LAPAROSCOPY N/A 07/01/2019   Procedure: Laparoscopy Diagnostic;  Surgeon: Kieth Brightly Arta Bruce, MD;  Location: [place];  Service: General;  Laterality: N/A;  . SMALL INTESTINE SURGERY      Social History: Social History   Socioeconomic History  . Marital status: Divorced    Spouse name: Not on file  . Number of children: Not on file  . Years of education: Not on file  . Highest education level: Not on file  Occupational History  . Occupation: foster parent  Tobacco Use  . Smoking status: Former Smoker    Packs/day: 0.20    Years: 30.00    Pack years: 6.00    Types: Cigarettes    Quit date: 11/19/2012    Years since quitting: 7.3  . Smokeless tobacco: Never Used  Vaping Use  . Vaping Use: Never used  Substance and Sexual Activity  . Alcohol use: No  . Drug use: No  . Sexual activity: Not Currently    Birth control/protection: Surgical  Other Topics Concern  . Not on file  Social History Narrative   No regular exercise   Social Determinants of Health   Financial Resource Strain:   . Difficulty of Paying Living Expenses:   Food Insecurity:   . Worried About Charity fundraiser in the Last Year:    . Arboriculturist in the Last Year:   Transportation Needs:   . Film/video editor (Medical):   Marland Kitchen Lack of Transportation (Non-Medical):   Physical Activity:   . Days of Exercise per Week:   . Minutes of Exercise per Session:   Stress:   . Feeling of Stress :   Social Connections:   . Frequency of Communication with Friends and Family:   . Frequency of Social Gatherings with Friends and Family:   . Attends Religious Services:   . Active Member of Clubs or Organizations:   . Attends Archivist Meetings:   Marland Kitchen Marital Status:   Intimate Partner Violence:   .  Fear of Current or Ex-Partner:   . Emotionally Abused:   Marland Kitchen Physically Abused:   . Sexually Abused:     Family History: Family History  Problem Relation Age of Onset  . Hypertension Mother   . Hyperlipidemia Mother   . Lupus Father   . Colon cancer Neg Hx     Review of Systems: Review of Systems  Constitutional: Negative.   Respiratory: Negative.   Cardiovascular: Negative.   Gastrointestinal: Negative.   Musculoskeletal: Negative.   Skin: Negative.     Physical Exam: Vital Signs BP (!) 144/82 (BP Location: Left Arm, Patient Position: Sitting, Cuff Size: Large)   Pulse 79   Temp 98.5 F (36.9 C) (Oral)   Wt (!) 204 lb 3.2 oz (92.6 kg)   SpO2 100%   BMI 38.58 kg/m   Physical Exam Constitutional:      General: She is not in acute distress.    Appearance: Normal appearance. She is not ill-appearing.  Cardiovascular:     Rate and Rhythm: Normal rate and regular rhythm.     Pulses: Normal pulses.     Heart sounds: Normal heart sounds.  Pulmonary:     Effort: Pulmonary effort is normal. No respiratory distress.     Breath sounds: Normal breath sounds. No wheezing.  Abdominal:     General: Abdomen is flat. There is no distension.     Palpations: There is mass.     Tenderness: There is abdominal tenderness.     Hernia: No hernia is present.    Musculoskeletal:        General: No swelling.  Normal range of motion.     Right lower leg: No edema.     Left lower leg: No edema.  Skin:    General: Skin is warm and dry.  Neurological:     General: No focal deficit present.     Mental Status: She is alert and oriented to person, place, and time.  Psychiatric:        Mood and Affect: Mood normal.        Behavior: Behavior normal.     Assessment/Plan: Patient is scheduled for excision of anterior abdominal wall fat necrosis with Dr. Claudia Desanctis. Risks, benefits, and alternatives of procedure discussed, questions answered and consent obtained.    Smoking Status: quit ~ 2014; Counseling Given? NA  Caprini Score: 3, moderate; Risk Factors include: age, BMI > 25, and length of planned surgery. Recommendation for mechanical prophylaxis during surgery. Encourage early ambulation.   Pictures obtained: 02/04/20  Post-op Rx sent to pharmacy: None at this time, await letter from pain management provider Abran Duke, NP at [facility].  Patient currently on chronic Norco 7.5-325. Discussed with patient she will need to call her pain management providers and discuss with them their protocol for post-op pain. I am happy to rx post-op pain medications, but this will need to be cleared with pain management prior. Will hold rx until we receive letter/information in regards to this. Discussed with patient norco 5-325 will be prescribed.  Patient was provided with the General Surgical Risk consent document and Pain Medication Agreement prior to their appointment.  They had adequate time to read through the risk consent documents and Pain Medication Agreement. We also discussed them in person together during this preop appointment. All of their questions were answered to their satisfaction.  Recommended calling if they have any further questions.  Risk consent form and Pain Medication Agreement to be scanned into  patient's chart.  Discussed with patient the risks associated with surgery  which included, but not limited to bleeding, infection, damage to surrounding structures, need for additional procedures.  Also discussed DVT/PE risks with patient.   Electronically signed by: Carola Rhine Megean Fabio, PA-C 03/09/2020 8:56 AM

## 2020-03-09 NOTE — H&P (View-Only) (Signed)
Patient ID: Reginald Mangels, female    DOB: 05-08-1961, 59 y.o.   MRN: [id number]  Chief Complaint  Patient presents with  . Pre-op Exam       ICD-10-CM   1. Fat necrosis  M79.89     History of Present Illness: Katria Botts is a 59 y.o.  female  with a history of laparoscopic gastric sleeve, lysis of adhesions, upper abdominal hernia repair and a resulting mass in her midline abdomen that bothers her with activities.  She presents for preoperative evaluation for upcoming procedure, excision of anterior abdominal wall fat necrosis, scheduled for 03/20/20 with Dr. Claudia Desanctis  The patient has not had problems with anesthesia. No history of DVT/PE.  No family history of DVT/PE.  No family or personal history of bleeding or clotting disorders.  Patient is not currently taking any blood thinners.  No history of CVA/MI.   PMH Significant for: anxiety, sarcoidosis, arthritis, HTN, Hypercholesterolemia, PONV, GERD.  Patient has seen pulmonology, most recent visit 12/16/19, recent PFTs improved per EMR review. Patient reports no SOB, CP, Dizziness, weakness, recent f/c/n/v.  Past Medical History: Allergies: Allergies  Allergen Reactions  . Aspirin Hives    Current Medications:  Current Outpatient Medications:  .  amLODipine (NORVASC) 5 MG tablet, Take 5 mg by mouth daily., Disp: , Rfl:  .  atorvastatin (LIPITOR) 20 MG tablet, Take 20 mg by mouth daily., Disp: , Rfl:  .  cyclobenzaprine (FLEXERIL) 10 MG tablet, Take 10 mg by mouth 2 (two) times daily as needed., Disp: , Rfl:  .  escitalopram (LEXAPRO) 5 MG tablet, Take 5 mg by mouth daily. , Disp: , Rfl:  .  gabapentin (NEURONTIN) 300 MG capsule, Take 300 mg by mouth 3 (three) times daily. , Disp: , Rfl:  .  HYDROcodone-acetaminophen (NORCO) 7.5-325 MG tablet, Take 1 tablet by mouth 3 (three) times daily as needed., Disp: , Rfl:  .  LINZESS 145 MCG CAPS capsule, Take 145 mcg by mouth daily as needed (constipation). , Disp: , Rfl:  .   methocarbamol (ROBAXIN) 500 MG tablet, Take 1 tablet (500 mg total) by mouth every 8 (eight) hours as needed for up to 30 doses for muscle spasms., Disp: 30 tablet, Rfl: 0 .  Multiple Vitamins-Minerals (WOMENS 50+ MULTI VITAMIN/MIN PO), Take 1 tablet by mouth daily., Disp: , Rfl:  .  rosuvastatin (CRESTOR) 5 MG tablet, Take 5 mg by mouth daily after breakfast., Disp: , Rfl:  .  ALPRAZolam (XANAX) 0.5 MG tablet, Take 0.5 mg by mouth 2 (two) times daily as needed for anxiety or sleep.  (Patient not taking: Reported on 03/09/2020), Disp: , Rfl:  .  ibuprofen (ADVIL) 600 MG tablet, Take 1 tablet (600 mg total) by mouth every 8 (eight) hours as needed for up to 30 doses. (Patient not taking: Reported on 03/09/2020), Disp: 30 tablet, Rfl: 0 .  NARCAN 4 MG/0.1ML LIQD nasal spray kit, ADMINISTER A SINGLE SPRAYWOF NARCAN INTO ONE NOSTRIL. REPEAT AFTER 3 MINUTES IN OTHER NOSTRIL IF NO RESPONSE. (Patient not taking: Reported on 03/09/2020), Disp: , Rfl:  .  oxyCODONE (OXY IR/ROXICODONE) 5 MG immediate release tablet, Take 5 mg by mouth every 6 (six) hours as needed. (Patient not taking: Reported on 03/09/2020), Disp: , Rfl:  .  Oxycodone HCl 10 MG TABS, Take 1 tablet (10 mg total) by mouth every 4 (four) hours as needed (for pain). (Patient not taking: Reported on 03/09/2020), Disp: 30 tablet, Rfl: 0  Past Medical Problems:  Past Medical History:  Diagnosis Date  . Anxiety   . Arthritis    back, hands , knees   . Chest pain 2010   due to sarcoidosis- per pt.   . Depression   . GERD (gastroesophageal reflux disease)   . Hypercholesterolemia   . Hypertension   . Pneumonia    sarcoidosis- in remission   . Postoperative nausea and vomiting 02/24/2019  . Pulmonary sarcoidosis ([place])    Treatment with Prednisone    Past Surgical History: Past Surgical History:  Procedure Laterality Date  . ABDOMINAL HYSTERECTOMY    . APPENDECTOMY    . CHOLECYSTECTOMY    . HERNIA REPAIR    . INCISIONAL HERNIA REPAIR N/A  07/01/2019   Procedure: OPEN INCISIONAL HERNIA REPAIR WITH MESH;  Surgeon: Kinsinger, Arta Bruce, MD;  Location: [place];  Service: General;  Laterality: N/A;  . INSERTION OF MESH N/A 07/01/2019   Procedure: Insertion Of Mesh;  Surgeon: Kinsinger, Arta Bruce, MD;  Location: [place];  Service: General;  Laterality: N/A;  . LAPAROSCOPIC GASTRIC SLEEVE RESECTION N/A 02/15/2019   Procedure: LAPAROSCOPIC GASTRIC SLEEVE RESECTION, UPPER ENDO, ERAS Pathway, Hiatal Hernia Repair;  Surgeon: Kinsinger, Arta Bruce, MD;  [facility];  Service: General;  Laterality: N/A;  . LAPAROSCOPY N/A 02/17/2019   Procedure: LAPAROSCOPY DIAGNOSTIC CONVERTED TO OPEN, EVACUATION OF HEMATOMA AND LIGATION OF VESSEL;  Surgeon: Kinsinger, Arta Bruce, MD;  Location: [facility];  Service: General;  Laterality: N/A;  . LAPAROSCOPY N/A 07/01/2019   Procedure: Laparoscopy Diagnostic;  Surgeon: Kieth Brightly Arta Bruce, MD;  Location: [place];  Service: General;  Laterality: N/A;  . SMALL INTESTINE SURGERY      Social History: Social History   Socioeconomic History  . Marital status: Divorced    Spouse name: Not on file  . Number of children: Not on file  . Years of education: Not on file  . Highest education level: Not on file  Occupational History  . Occupation: foster parent  Tobacco Use  . Smoking status: Former Smoker    Packs/day: 0.20    Years: 30.00    Pack years: 6.00    Types: Cigarettes    Quit date: 11/19/2012    Years since quitting: 7.3  . Smokeless tobacco: Never Used  Vaping Use  . Vaping Use: Never used  Substance and Sexual Activity  . Alcohol use: No  . Drug use: No  . Sexual activity: Not Currently    Birth control/protection: Surgical  Other Topics Concern  . Not on file  Social History Narrative   No regular exercise   Social Determinants of Health   Financial Resource Strain:   . Difficulty of Paying Living Expenses:   Food Insecurity:   . Worried About Charity fundraiser in the Last Year:    . Arboriculturist in the Last Year:   Transportation Needs:   . Film/video editor (Medical):   Marland Kitchen Lack of Transportation (Non-Medical):   Physical Activity:   . Days of Exercise per Week:   . Minutes of Exercise per Session:   Stress:   . Feeling of Stress :   Social Connections:   . Frequency of Communication with Friends and Family:   . Frequency of Social Gatherings with Friends and Family:   . Attends Religious Services:   . Active Member of Clubs or Organizations:   . Attends Archivist Meetings:   Marland Kitchen Marital Status:   Intimate Partner Violence:   .  Fear of Current or Ex-Partner:   . Emotionally Abused:   Marland Kitchen Physically Abused:   . Sexually Abused:     Family History: Family History  Problem Relation Age of Onset  . Hypertension Mother   . Hyperlipidemia Mother   . Lupus Father   . Colon cancer Neg Hx     Review of Systems: Review of Systems  Constitutional: Negative.   Respiratory: Negative.   Cardiovascular: Negative.   Gastrointestinal: Negative.   Musculoskeletal: Negative.   Skin: Negative.     Physical Exam: Vital Signs BP (!) 144/82 (BP Location: Left Arm, Patient Position: Sitting, Cuff Size: Large)   Pulse 79   Temp 98.5 F (36.9 C) (Oral)   Wt (!) 204 lb 3.2 oz (92.6 kg)   SpO2 100%   BMI 38.58 kg/m   Physical Exam Constitutional:      General: She is not in acute distress.    Appearance: Normal appearance. She is not ill-appearing.  Cardiovascular:     Rate and Rhythm: Normal rate and regular rhythm.     Pulses: Normal pulses.     Heart sounds: Normal heart sounds.  Pulmonary:     Effort: Pulmonary effort is normal. No respiratory distress.     Breath sounds: Normal breath sounds. No wheezing.  Abdominal:     General: Abdomen is flat. There is no distension.     Palpations: There is mass.     Tenderness: There is abdominal tenderness.     Hernia: No hernia is present.    Musculoskeletal:        General: No swelling.  Normal range of motion.     Right lower leg: No edema.     Left lower leg: No edema.  Skin:    General: Skin is warm and dry.  Neurological:     General: No focal deficit present.     Mental Status: She is alert and oriented to person, place, and time.  Psychiatric:        Mood and Affect: Mood normal.        Behavior: Behavior normal.     Assessment/Plan: Patient is scheduled for excision of anterior abdominal wall fat necrosis with Dr. Claudia Desanctis. Risks, benefits, and alternatives of procedure discussed, questions answered and consent obtained.    Smoking Status: quit ~ 2014; Counseling Given? NA  Caprini Score: 3, moderate; Risk Factors include: age, BMI > 25, and length of planned surgery. Recommendation for mechanical prophylaxis during surgery. Encourage early ambulation.   Pictures obtained: 02/04/20  Post-op Rx sent to pharmacy: None at this time, await letter from pain management provider Abran Duke, NP at [facility].  Patient currently on chronic Norco 7.5-325. Discussed with patient she will need to call her pain management providers and discuss with them their protocol for post-op pain. I am happy to rx post-op pain medications, but this will need to be cleared with pain management prior. Will hold rx until we receive letter/information in regards to this. Discussed with patient norco 5-325 will be prescribed.  Patient was provided with the General Surgical Risk consent document and Pain Medication Agreement prior to their appointment.  They had adequate time to read through the risk consent documents and Pain Medication Agreement. We also discussed them in person together during this preop appointment. All of their questions were answered to their satisfaction.  Recommended calling if they have any further questions.  Risk consent form and Pain Medication Agreement to be scanned into  patient's chart.  Discussed with patient the risks associated with surgery  which included, but not limited to bleeding, infection, damage to surrounding structures, need for additional procedures.  Also discussed DVT/PE risks with patient.   Electronically signed by: Carola Rhine Laren Whaling, PA-C 03/09/2020 8:56 AM

## 2020-03-13 ENCOUNTER — Encounter (HOSPITAL_BASED_OUTPATIENT_CLINIC_OR_DEPARTMENT_OTHER): Payer: Self-pay | Admitting: Plastic Surgery

## 2020-03-13 ENCOUNTER — Other Ambulatory Visit: Payer: Self-pay

## 2020-03-16 ENCOUNTER — Other Ambulatory Visit (HOSPITAL_COMMUNITY)
Admission: RE | Admit: 2020-03-16 | Discharge: 2020-03-16 | Disposition: A | Discharge status: Home / Self Care | Payer: Medicare Other | Source: Ambulatory Visit | Attending: Plastic Surgery | Admitting: Plastic Surgery

## 2020-03-16 ENCOUNTER — Other Ambulatory Visit: Payer: Self-pay

## 2020-03-16 ENCOUNTER — Encounter (HOSPITAL_BASED_OUTPATIENT_CLINIC_OR_DEPARTMENT_OTHER)
Admission: RE | Admit: 2020-03-16 | Discharge: 2020-03-16 | Disposition: A | Discharge status: Home / Self Care | Payer: Medicare Other | Source: Ambulatory Visit | Attending: Plastic Surgery | Admitting: Plastic Surgery

## 2020-03-16 DIAGNOSIS — Z01810 Encounter for preprocedural cardiovascular examination: Secondary | ICD-10-CM | POA: Insufficient documentation

## 2020-03-16 DIAGNOSIS — Z01812 Encounter for preprocedural laboratory examination: Secondary | ICD-10-CM | POA: Insufficient documentation

## 2020-03-16 DIAGNOSIS — Z20822 Contact with and (suspected) exposure to covid-19: Secondary | ICD-10-CM | POA: Insufficient documentation

## 2020-03-16 LAB — SARS CORONAVIRUS 2 (TAT 6-24 HRS): SARS Coronavirus 2: NEGATIVE

## 2020-03-20 ENCOUNTER — Ambulatory Visit (HOSPITAL_BASED_OUTPATIENT_CLINIC_OR_DEPARTMENT_OTHER): Payer: Medicare Other | Admitting: Anesthesiology

## 2020-03-20 ENCOUNTER — Other Ambulatory Visit: Payer: Self-pay | Admitting: Surgical

## 2020-03-20 ENCOUNTER — Other Ambulatory Visit: Payer: Self-pay

## 2020-03-20 ENCOUNTER — Encounter (HOSPITAL_BASED_OUTPATIENT_CLINIC_OR_DEPARTMENT_OTHER)
Admission: RE | Disposition: A | Discharge status: Home / Self Care | Payer: Self-pay | Source: Home / Self Care | Attending: Plastic Surgery

## 2020-03-20 ENCOUNTER — Encounter (HOSPITAL_BASED_OUTPATIENT_CLINIC_OR_DEPARTMENT_OTHER): Payer: Self-pay | Admitting: Plastic Surgery

## 2020-03-20 ENCOUNTER — Ambulatory Visit (HOSPITAL_BASED_OUTPATIENT_CLINIC_OR_DEPARTMENT_OTHER)
Admission: RE | Admit: 2020-03-20 | Discharge: 2020-03-20 | Disposition: A | Discharge status: Home / Self Care | Payer: Medicare Other | Attending: Plastic Surgery | Admitting: Plastic Surgery

## 2020-03-20 DIAGNOSIS — Z87891 Personal history of nicotine dependence: Secondary | ICD-10-CM | POA: Insufficient documentation

## 2020-03-20 DIAGNOSIS — Z886 Allergy status to analgesic agent status: Secondary | ICD-10-CM | POA: Diagnosis not present

## 2020-03-20 DIAGNOSIS — M1990 Unspecified osteoarthritis, unspecified site: Secondary | ICD-10-CM | POA: Insufficient documentation

## 2020-03-20 DIAGNOSIS — Z79899 Other long term (current) drug therapy: Secondary | ICD-10-CM | POA: Diagnosis not present

## 2020-03-20 DIAGNOSIS — Z90710 Acquired absence of both cervix and uterus: Secondary | ICD-10-CM | POA: Diagnosis not present

## 2020-03-20 DIAGNOSIS — M799 Soft tissue disorder, unspecified: Secondary | ICD-10-CM | POA: Diagnosis not present

## 2020-03-20 DIAGNOSIS — K654 Sclerosing mesenteritis: Secondary | ICD-10-CM | POA: Insufficient documentation

## 2020-03-20 DIAGNOSIS — F419 Anxiety disorder, unspecified: Secondary | ICD-10-CM | POA: Diagnosis not present

## 2020-03-20 DIAGNOSIS — Z9884 Bariatric surgery status: Secondary | ICD-10-CM | POA: Insufficient documentation

## 2020-03-20 DIAGNOSIS — K219 Gastro-esophageal reflux disease without esophagitis: Secondary | ICD-10-CM | POA: Diagnosis not present

## 2020-03-20 DIAGNOSIS — Z9049 Acquired absence of other specified parts of digestive tract: Secondary | ICD-10-CM | POA: Diagnosis not present

## 2020-03-20 DIAGNOSIS — E7800 Pure hypercholesterolemia, unspecified: Secondary | ICD-10-CM | POA: Diagnosis not present

## 2020-03-20 DIAGNOSIS — I10 Essential (primary) hypertension: Secondary | ICD-10-CM | POA: Insufficient documentation

## 2020-03-20 DIAGNOSIS — Z8249 Family history of ischemic heart disease and other diseases of the circulatory system: Secondary | ICD-10-CM | POA: Diagnosis not present

## 2020-03-20 DIAGNOSIS — D649 Anemia, unspecified: Secondary | ICD-10-CM | POA: Diagnosis not present

## 2020-03-20 HISTORY — DX: Anemia, unspecified: D64.9

## 2020-03-20 HISTORY — DX: Other chronic pain: G89.29

## 2020-03-20 HISTORY — PX: EXCISION MASS ABDOMINAL: SHX6701

## 2020-03-20 SURGERY — EXCISION, MASS, TORSO
Anesthesia: General | Site: Abdomen

## 2020-03-20 MED ORDER — BUPIVACAINE HCL (PF) 0.25 % IJ SOLN
INTRAMUSCULAR | Status: AC
  Filled 2020-03-20: qty 30

## 2020-03-20 MED ORDER — MIDAZOLAM HCL 5 MG/5ML IJ SOLN
INTRAMUSCULAR | Status: DC | PRN
  Administered 2020-03-20: 2 mg via INTRAVENOUS

## 2020-03-20 MED ORDER — ONDANSETRON HCL 4 MG/2ML IJ SOLN
INTRAMUSCULAR | Status: DC | PRN
  Administered 2020-03-20: 4 mg via INTRAVENOUS

## 2020-03-20 MED ORDER — OXYCODONE HCL 5 MG/5ML PO SOLN: 5.0000 mg | Freq: Once | ORAL | Status: AC | PRN

## 2020-03-20 MED ORDER — LIDOCAINE 2% (20 MG/ML) 5 ML SYRINGE
INTRAMUSCULAR | Status: AC
  Filled 2020-03-20: qty 5

## 2020-03-20 MED ORDER — SUGAMMADEX SODIUM 200 MG/2ML IV SOLN
INTRAVENOUS | Status: DC | PRN
  Administered 2020-03-20: 200 mg via INTRAVENOUS

## 2020-03-20 MED ORDER — ONDANSETRON HCL 4 MG/2ML IJ SOLN: 4.0000 mg | Freq: Once | INTRAMUSCULAR | Status: DC | PRN

## 2020-03-20 MED ORDER — DEXAMETHASONE SODIUM PHOSPHATE 10 MG/ML IJ SOLN
INTRAMUSCULAR | Status: AC
  Filled 2020-03-20: qty 1

## 2020-03-20 MED ORDER — PROPOFOL 10 MG/ML IV BOLUS
INTRAVENOUS | Status: DC | PRN
  Administered 2020-03-20: 170 mg via INTRAVENOUS

## 2020-03-20 MED ORDER — BACITRACIN ZINC 500 UNIT/GM EX OINT
TOPICAL_OINTMENT | CUTANEOUS | Status: AC
  Filled 2020-03-20: qty 28.35

## 2020-03-20 MED ORDER — OXYCODONE HCL 5 MG PO TABS
5.0000 mg | ORAL_TABLET | Freq: Once | ORAL | Status: AC | PRN
  Administered 2020-03-20: 5 mg via ORAL

## 2020-03-20 MED ORDER — FENTANYL CITRATE (PF) 100 MCG/2ML IJ SOLN
INTRAMUSCULAR | Status: AC
  Filled 2020-03-20: qty 2

## 2020-03-20 MED ORDER — ROCURONIUM BROMIDE 100 MG/10ML IV SOLN
INTRAVENOUS | Status: DC | PRN
  Administered 2020-03-20: 55 mg via INTRAVENOUS

## 2020-03-20 MED ORDER — LIDOCAINE HCL (CARDIAC) PF 100 MG/5ML IV SOSY
PREFILLED_SYRINGE | INTRAVENOUS | Status: DC | PRN
  Administered 2020-03-20: 100 mg via INTRAVENOUS

## 2020-03-20 MED ORDER — ONDANSETRON HCL 4 MG/2ML IJ SOLN
INTRAMUSCULAR | Status: AC
  Filled 2020-03-20: qty 2

## 2020-03-20 MED ORDER — LACTATED RINGERS IV SOLN
INTRAVENOUS | Status: DC | PRN
  Administered 2020-03-20: 150 mL

## 2020-03-20 MED ORDER — OXYCODONE HCL 5 MG PO TABS
ORAL_TABLET | ORAL | Status: AC
  Filled 2020-03-20: qty 1

## 2020-03-20 MED ORDER — CEFAZOLIN SODIUM-DEXTROSE 2-4 GM/100ML-% IV SOLN
INTRAVENOUS | Status: AC
  Filled 2020-03-20: qty 100

## 2020-03-20 MED ORDER — LIDOCAINE-EPINEPHRINE 1 %-1:100000 IJ SOLN
INTRAMUSCULAR | Status: AC
  Filled 2020-03-20: qty 1

## 2020-03-20 MED ORDER — MIDAZOLAM HCL 2 MG/2ML IJ SOLN
INTRAMUSCULAR | Status: AC
  Filled 2020-03-20: qty 2

## 2020-03-20 MED ORDER — FENTANYL CITRATE (PF) 100 MCG/2ML IJ SOLN
25.0000 ug | INTRAMUSCULAR | Status: DC | PRN
  Administered 2020-03-20 (×3): 50 ug via INTRAVENOUS

## 2020-03-20 MED ORDER — PROPOFOL 10 MG/ML IV BOLUS
INTRAVENOUS | Status: AC
  Filled 2020-03-20: qty 20

## 2020-03-20 MED ORDER — LACTATED RINGERS IV SOLN: INTRAVENOUS | Status: DC

## 2020-03-20 MED ORDER — EPINEPHRINE PF 1 MG/ML IJ SOLN
INTRAMUSCULAR | Status: AC
  Filled 2020-03-20: qty 1

## 2020-03-20 MED ORDER — ROCURONIUM BROMIDE 10 MG/ML (PF) SYRINGE
PREFILLED_SYRINGE | INTRAVENOUS | Status: AC
  Filled 2020-03-20: qty 10

## 2020-03-20 MED ORDER — CEFAZOLIN SODIUM-DEXTROSE 2-4 GM/100ML-% IV SOLN
2.0000 g | INTRAVENOUS | Status: AC
  Administered 2020-03-20: 2 g via INTRAVENOUS

## 2020-03-20 MED ORDER — DEXMEDETOMIDINE (PRECEDEX) IN NS 20 MCG/5ML (4 MCG/ML) IV SYRINGE
PREFILLED_SYRINGE | INTRAVENOUS | Status: DC | PRN
  Administered 2020-03-20: 12 ug via INTRAVENOUS

## 2020-03-20 MED ORDER — DEXAMETHASONE SODIUM PHOSPHATE 4 MG/ML IJ SOLN
INTRAMUSCULAR | Status: DC | PRN
  Administered 2020-03-20: 10 mg via INTRAVENOUS

## 2020-03-20 MED ORDER — FENTANYL CITRATE (PF) 100 MCG/2ML IJ SOLN
INTRAMUSCULAR | Status: DC | PRN
  Administered 2020-03-20: 100 ug via INTRAVENOUS

## 2020-03-20 SURGICAL SUPPLY — 84 items
ADH SKN CLS APL DERMABOND .7 (GAUZE/BANDAGES/DRESSINGS)
APL PRP STRL LF DISP 70% ISPRP (MISCELLANEOUS) ×1
APL SKNCLS STERI-STRIP NONHPOA (GAUZE/BANDAGES/DRESSINGS) ×1
BAND INSRT 18 STRL LF DISP RB (MISCELLANEOUS)
BAND RUBBER #18 3X1/16 STRL (MISCELLANEOUS) IMPLANT
BENZOIN TINCTURE PRP APPL 2/3 (GAUZE/BANDAGES/DRESSINGS) ×1 IMPLANT
BINDER ABDOMINAL 10 UNV 27-48 (MISCELLANEOUS) ×1 IMPLANT
BLADE CLIPPER SURG (BLADE) IMPLANT
BLADE SURG 10 STRL SS (BLADE) ×1 IMPLANT
BLADE SURG 15 STRL LF DISP TIS (BLADE) ×1 IMPLANT
BLADE SURG 15 STRL SS (BLADE) ×2
CANISTER SUCT 1200ML W/VALVE (MISCELLANEOUS) ×1 IMPLANT
CHLORAPREP W/TINT 26 (MISCELLANEOUS) ×2 IMPLANT
COVER BACK TABLE 60X90IN (DRAPES) ×2 IMPLANT
COVER MAYO STAND STRL (DRAPES) ×2 IMPLANT
COVER WAND RF STERILE (DRAPES) IMPLANT
DERMABOND ADVANCED (GAUZE/BANDAGES/DRESSINGS)
DERMABOND ADVANCED .7 DNX12 (GAUZE/BANDAGES/DRESSINGS) IMPLANT
DRAIN JP 10F RND SILICONE (MISCELLANEOUS) IMPLANT
DRAPE LAPAROTOMY 100X72 PEDS (DRAPES) ×1 IMPLANT
DRAPE UTILITY XL STRL (DRAPES) ×2 IMPLANT
DRSG PAD ABDOMINAL 8X10 ST (GAUZE/BANDAGES/DRESSINGS) ×1 IMPLANT
DRSG TELFA 3X8 NADH (GAUZE/BANDAGES/DRESSINGS) IMPLANT
ELECT COATED BLADE 2.86 ST (ELECTRODE) IMPLANT
ELECT NDL BLADE 2-5/6 (NEEDLE) ×1 IMPLANT
ELECT NEEDLE BLADE 2-5/6 (NEEDLE) ×2 IMPLANT
ELECT REM PT RETURN 9FT ADLT (ELECTROSURGICAL) ×2
ELECT REM PT RETURN 9FT PED (ELECTROSURGICAL)
ELECTRODE REM PT RETRN 9FT PED (ELECTROSURGICAL) IMPLANT
ELECTRODE REM PT RTRN 9FT ADLT (ELECTROSURGICAL) IMPLANT
EVACUATOR SILICONE 100CC (DRAIN) IMPLANT
GAUZE SPONGE 4X4 12PLY STRL LF (GAUZE/BANDAGES/DRESSINGS) IMPLANT
GAUZE XEROFORM 1X8 LF (GAUZE/BANDAGES/DRESSINGS) IMPLANT
GLOVE BIO SURGEON STRL SZ7.5 (GLOVE) ×1 IMPLANT
GLOVE BIOGEL M STRL SZ7.5 (GLOVE) ×2 IMPLANT
GLOVE BIOGEL PI IND STRL 7.0 (GLOVE) IMPLANT
GLOVE BIOGEL PI IND STRL 8 (GLOVE) ×1 IMPLANT
GLOVE BIOGEL PI INDICATOR 7.0 (GLOVE) ×1
GLOVE BIOGEL PI INDICATOR 8 (GLOVE) ×1
GLOVE ECLIPSE 7.0 STRL STRAW (GLOVE) ×1 IMPLANT
GOWN STRL REUS W/ TWL LRG LVL3 (GOWN DISPOSABLE) ×2 IMPLANT
GOWN STRL REUS W/TWL LRG LVL3 (GOWN DISPOSABLE) ×6
NDL FILTER BLUNT 18X1 1/2 (NEEDLE) IMPLANT
NDL HYPO 30GX1 BEV (NEEDLE) IMPLANT
NDL PRECISIONGLIDE 27X1.5 (NEEDLE) ×1 IMPLANT
NDL SAFETY ECLIPSE 18X1.5 (NEEDLE) IMPLANT
NDL SPNL 18GX3.5 QUINCKE PK (NEEDLE) IMPLANT
NEEDLE FILTER BLUNT 18X 1/2SAF (NEEDLE) ×1
NEEDLE FILTER BLUNT 18X1 1/2 (NEEDLE) ×1 IMPLANT
NEEDLE HYPO 18GX1.5 SHARP (NEEDLE) ×2
NEEDLE HYPO 30GX1 BEV (NEEDLE) IMPLANT
NEEDLE PRECISIONGLIDE 27X1.5 (NEEDLE) ×2 IMPLANT
NEEDLE SPNL 18GX3.5 QUINCKE PK (NEEDLE) ×2 IMPLANT
NS IRRIG 1000ML POUR BTL (IV SOLUTION) ×1 IMPLANT
PACK BASIN DAY SURGERY FS (CUSTOM PROCEDURE TRAY) ×2 IMPLANT
PAD DRESSING TELFA 3X8 NADH (GAUZE/BANDAGES/DRESSINGS) IMPLANT
PENCIL SMOKE EVACUATOR (MISCELLANEOUS) ×2 IMPLANT
SHEET MEDIUM DRAPE 40X70 STRL (DRAPES) IMPLANT
SLEEVE SCD COMPRESS KNEE MED (MISCELLANEOUS) ×1 IMPLANT
SPONGE GAUZE 2X2 8PLY STRL LF (GAUZE/BANDAGES/DRESSINGS) IMPLANT
SPONGE LAP 18X18 RF (DISPOSABLE) ×2 IMPLANT
STAPLER INSORB 30 2030 C-SECTI (MISCELLANEOUS) ×1 IMPLANT
STAPLER VISISTAT 35W (STAPLE) ×2 IMPLANT
STRIP CLOSURE SKIN 1/2X4 (GAUZE/BANDAGES/DRESSINGS) ×1 IMPLANT
SUCTION FRAZIER HANDLE 10FR (MISCELLANEOUS)
SUCTION TUBE FRAZIER 10FR DISP (MISCELLANEOUS) IMPLANT
SUT ETHILON 4 0 PS 2 18 (SUTURE) IMPLANT
SUT MNCRL AB 4-0 PS2 18 (SUTURE) IMPLANT
SUT MON AB 5-0 P3 18 (SUTURE) IMPLANT
SUT PDS 3-0 CT2 (SUTURE) ×2
SUT PDS II 3-0 CT2 27 ABS (SUTURE) IMPLANT
SUT PLAIN 5 0 P 3 18 (SUTURE) IMPLANT
SUT PROLENE 5 0 P 3 (SUTURE) IMPLANT
SUT VICRYL 4-0 PS2 18IN ABS (SUTURE) IMPLANT
SUT VLOC 90 P-14 23 (SUTURE) ×1 IMPLANT
SWAB COLLECTION DEVICE MRSA (MISCELLANEOUS) IMPLANT
SWAB CULTURE ESWAB REG 1ML (MISCELLANEOUS) IMPLANT
SYR 50ML LL SCALE MARK (SYRINGE) ×1 IMPLANT
SYR BULB EAR ULCER 3OZ GRN STR (SYRINGE) IMPLANT
SYR CONTROL 10ML LL (SYRINGE) ×2 IMPLANT
TOWEL GREEN STERILE FF (TOWEL DISPOSABLE) ×2 IMPLANT
TRAY DSU PREP LF (CUSTOM PROCEDURE TRAY) IMPLANT
TUBE CONNECTING 20X1/4 (TUBING) ×1 IMPLANT
TUBING INFILTRATION IT-10001 (TUBING) ×1 IMPLANT

## 2020-03-20 NOTE — Anesthesia Procedure Notes (Signed)
Procedure Name: Intubation Date/Time: 03/20/2020 8:57 AM Performed by: Thornell Mule, CRNA Pre-anesthesia Checklist: Patient identified, Emergency Drugs available, Suction available and Patient being monitored Patient Re-evaluated:Patient Re-evaluated prior to induction Oxygen Delivery Method: Circle system utilized Preoxygenation: Pre-oxygenation with 100% oxygen Induction Type: IV induction Ventilation: Mask ventilation without difficulty Laryngoscope Size: Miller and 3 Grade View: Grade I Tube type: Oral Tube size: 7.0 mm Number of attempts: 1 Airway Equipment and Method: Stylet and Oral airway Placement Confirmation: ETT inserted through vocal cords under direct vision,  positive ETCO2 and breath sounds checked- equal and bilateral Secured at: 21 cm Tube secured with: Tape Dental Injury: Teeth and Oropharynx as per pre-operative assessment

## 2020-03-20 NOTE — OR Nursing (Signed)
Patient states jewelry in right nare and right lower lip cannot be removed.  Patient advised potential/possibility for a burn due to the use of electrocautery.  Patient given opportunity to remove jewelry prior to procedure but again stated it cannot be removed.

## 2020-03-20 NOTE — Transfer of Care (Signed)
Immediate Anesthesia Transfer of Care Note  Patient: [name]  Procedure(s) Performed: Excision of anterior abdominal wall fat necrosis (N/A Abdomen)  Patient Location: PACU  Anesthesia Type:General  Level of Consciousness: drowsy, patient cooperative and responds to stimulation  Airway & Oxygen Therapy: Patient Spontanous Breathing and Patient connected to face mask oxygen  Post-op Assessment: Report given to RN and Post -op Vital signs reviewed and stable  Post vital signs: Reviewed and stable  Last Vitals:  Vitals Value Taken Time  BP    Temp    Pulse 74 03/20/20 0950  Resp 16 03/20/20 0950  SpO2 100 % 03/20/20 0950  Vitals shown include unvalidated device data.  Last Pain:  Vitals:   03/20/20 0732  TempSrc: Oral  PainSc: 0-No pain         Complications: No complications documented.

## 2020-03-20 NOTE — Interval H&P Note (Signed)
History and Physical Interval Note:  03/20/2020 8:38 AM  [name]  has presented today for surgery, with the diagnosis of fat necrosis of abdomen.  The various methods of treatment have been discussed with the patient and family. After consideration of risks, benefits and other options for treatment, the patient has consented to  Procedure(s): Excision of anterior abdominal wall fat necrosis (N/A) as a surgical intervention.  The patient's history has been reviewed, patient examined, no change in status, stable for surgery.  I have reviewed the patient's chart and labs.  Questions were answered to the patient's satisfaction.     [name]

## 2020-03-20 NOTE — Anesthesia Postprocedure Evaluation (Signed)
Anesthesia Post Note  Patient: [name]  Procedure(s) Performed: Excision of anterior abdominal wall fat necrosis (N/A Abdomen)     Patient location during evaluation: PACU Anesthesia Type: General Level of consciousness: awake and alert Pain management: pain level controlled Vital Signs Assessment: post-procedure vital signs reviewed and stable Respiratory status: spontaneous breathing, nonlabored ventilation, respiratory function stable and patient connected to nasal cannula oxygen Cardiovascular status: blood pressure returned to baseline and stable Postop Assessment: no apparent nausea or vomiting Anesthetic complications: no   No complications documented.  Last Vitals:  Vitals:   03/20/20 1122 03/20/20 1215  BP: (!) 166/83   Pulse: 65   Resp: 18 20  Temp: 36.6 C   SpO2: 99%     Last Pain:  Vitals:   03/20/20 1215  TempSrc:   PainSc: 5                  Telecia Larocque COKER

## 2020-03-20 NOTE — Discharge Instructions (Addendum)
Activity As tolerated: NO showers until 24 hours after surgery.  NO driving for 16-96 hours, do not drive when taking pain medication or in pain. No heavy activities  Diet: Regular  Wound Care: Keep dressing clean & dry Wear abdominal binder 24/7, unless showering.  Special Instructions:  Call Doctor if any unusual problems occur such as pain, excessive Bleeding, unrelieved Nausea/vomiting, Fever &/or chills  Follow-up appointment: Scheduled for next week.     Post Anesthesia Home Care Instructions  Activity: Get plenty of rest for the remainder of the day. A responsible individual must stay with you for 24 hours following the procedure.  For the next 24 hours, DO NOT: -Drive a car -Advertising copywriter -Drink alcoholic beverages -Take any medication unless instructed by your physician -Make any legal decisions or sign important papers.  Meals: Start with liquid foods such as gelatin or soup. Progress to regular foods as tolerated. Avoid greasy, spicy, heavy foods. If nausea and/or vomiting occur, drink only clear liquids until the nausea and/or vomiting subsides. Call your physician if vomiting continues.  Special Instructions/Symptoms: Your throat may feel dry or sore from the anesthesia or the breathing tube placed in your throat during surgery. If this causes discomfort, gargle with warm salt water. The discomfort should disappear within 24 hours.  If you had a scopolamine patch placed behind your ear for the management of post- operative nausea and/or vomiting:  1. The medication in the patch is effective for 72 hours, after which it should be removed.  Wrap patch in a tissue and discard in the trash. Wash hands thoroughly with soap and water. 2. You may remove the patch earlier than 72 hours if you experience unpleasant side effects which may include dry mouth, dizziness or visual disturbances. 3. Avoid touching the patch. Wash your hands with soap and water after contact  with the patch.

## 2020-03-20 NOTE — Op Note (Signed)
Operative Note   DATE OF OPERATION: 03/20/2020  SURGICAL DEPARTMENT: Plastic Surgery  PREOPERATIVE DIAGNOSES: Abdominal wall fat necrosis  POSTOPERATIVE DIAGNOSES:  same  PROCEDURE: Excision of 10 x 5 cm abdominal wall fat necrosis with complex closure  SURGEON: Ancil Linsey, MD  ASSISTANT: Zadie Cleverly, PA The advanced practice practitioner (APP) assisted throughout the case.  The APP was essential in retraction and counter traction when needed to make the case progress smoothly.  This retraction and assistance made it possible to see the tissue plans for the procedure.  The assistance was needed for blood control, tissue re-approximation and assisted with closure of the incision site.  ANESTHESIA:  General.   COMPLICATIONS: None.   INDICATIONS FOR PROCEDURE:  The patient, [name] is a 59 y.o. female born on 1960-12-21, is here for treatment of symptomatic abdominal wall fat necrosis MRN: [id number]  CONSENT:  Informed consent was obtained directly from the patient. Risks, benefits and alternatives were fully discussed. Specific risks including but not limited to bleeding, infection, hematoma, seroma, scarring, pain, contracture, asymmetry, wound healing problems, and need for further surgery were all discussed. The patient did have an ample opportunity to have questions answered to satisfaction.   DESCRIPTION OF PROCEDURE:  The patient was taken to the operating room. SCDs were placed and Ancef antibiotics were given.  General anesthesia was administered.  The patient's operative site was prepped and draped in a sterile fashion. A time out was performed and all information was confirmed to be correct.  The area around the firm palpable mass was infiltrated with tumescent solution.  Made an elliptical incision directly overlying the firm palpable mass.  Cautery was used to dissect down underneath what I could feel to be firm and abnormal.  The abdominal wall was not  violated.  Cautery was then used to excise the specimen en bloc.  Hemostasis was meticulously obtained.  The diseased area was about 10 x 5 cm in size.  The surrounding skin was then undermined and advanced and closure was done with 3-0 PDS sutures, and sorb stapler, and a running 3 OV lock.  Steri-Strips and a soft dressing were applied.  The patient tolerated the procedure well.  There were no complications. The patient was allowed to wake from anesthesia, extubated and taken to the recovery room in satisfactory condition.

## 2020-03-20 NOTE — Anesthesia Preprocedure Evaluation (Addendum)
Anesthesia Evaluation  Patient identified by MRN, date of birth, ID band Patient awake    Reviewed: Allergy & Precautions, NPO status , Patient's Chart, lab work & pertinent test results  Airway Mallampati: III  TM Distance: >3 FB Neck ROM: Full    Dental  (+) Edentulous Upper, Edentulous Lower   Pulmonary former smoker,    breath sounds clear to auscultation       Cardiovascular hypertension,  Rhythm:Regular Rate:Normal     Neuro/Psych    GI/Hepatic   Endo/Other    Renal/GU      Musculoskeletal   Abdominal (+) + obese,   Peds  Hematology   Anesthesia Other Findings   Reproductive/Obstetrics                             Anesthesia Physical Anesthesia Plan  ASA: III  Anesthesia Plan: General   Post-op Pain Management:    Induction: Intravenous  PONV Risk Score and Plan: Ondansetron and Dexamethasone  Airway Management Planned: Oral ETT  Additional Equipment:   Intra-op Plan:   Post-operative Plan: Extubation in OR  Informed Consent: I have reviewed the patients History and Physical, chart, labs and discussed the procedure including the risks, benefits and alternatives for the proposed anesthesia with the patient or authorized representative who has indicated his/her understanding and acceptance.       Plan Discussed with: CRNA and Anesthesiologist  Anesthesia Plan Comments:         Anesthesia Quick Evaluation

## 2020-03-20 NOTE — Brief Op Note (Signed)
03/20/2020  9:48 AM  PATIENT:  [name]  59 y.o. female  PRE-OPERATIVE DIAGNOSIS:  fat necrosis of abdomen  POST-OPERATIVE DIAGNOSIS:  fat necrosis of abdomen  PROCEDURE:  Procedure(s): Excision of anterior abdominal wall fat necrosis (N/A)  SURGEON:  Surgeon(s) and Role:    * Sherika Kubicki, Wendy Poet, MD - Primary  PHYSICIAN ASSISTANT: Materials engineer, PA  ASSISTANTS: none   ANESTHESIA:   general  EBL:  10   BLOOD ADMINISTERED:none  DRAINS: none   LOCAL MEDICATIONS USED:  MARCAINE     SPECIMEN:  Source of Specimen:  abdominal wall fat necrossi  DISPOSITION OF SPECIMEN:  PATHOLOGY  COUNTS:  YES  TOURNIQUET:  * No tourniquets in log *  DICTATION: .Dragon Dictation  PLAN OF CARE: Discharge to home after PACU  PATIENT DISPOSITION:  PACU - hemodynamically stable.   Delay start of Pharmacological VTE agent (>24hrs) due to surgical blood loss or risk of bleeding: not applicable

## 2020-03-21 ENCOUNTER — Telehealth: Payer: Self-pay | Admitting: Plastic Surgery

## 2020-03-21 ENCOUNTER — Encounter (HOSPITAL_BASED_OUTPATIENT_CLINIC_OR_DEPARTMENT_OTHER): Payer: Self-pay | Admitting: Plastic Surgery

## 2020-03-21 LAB — SURGICAL PATHOLOGY

## 2020-03-21 NOTE — Telephone Encounter (Signed)
Calling patient to discuss

## 2020-03-21 NOTE — Telephone Encounter (Signed)
Please call patient and advise her Norco 5-325 is what we prescribe for this type of procedure and that no stronger pain medications will be called in. I recommend she supplement her pain between doses of norco with tylenol 325mg  q6 hours and ibuprofen q8 hours.  We still have not received any letter/notification from her pain specialists indicating that they are okay with prescribing pain medications

## 2020-03-21 NOTE — Telephone Encounter (Signed)
Called patient to discuss, she reports that she has been taking Norco 7.5mg  and they have provided minimal relief.  I discussed with her that I have not seen the letter from her pain specialist.  She reports that she had dropped it off by our office, but I am not aware of this.  I have discussed with the front desk staff and they have notified me that if they had received it it may be on its way to be scanned into the chart, but they do not recall if they received it.  I discussed with the patient that I recommend adding Tylenol and ibuprofen in between doses of Norco to assist with pain.  I discussed with patient typical methods of taking Tylenol and ibuprofen between doses.  I advised her I would call her if I received a letter from her pain specialist.  She is taking Norco 7.5 mg daily for chronic pain.  I discussed with her that I would not prescribe anything stronger than this.

## 2020-03-21 NOTE — Telephone Encounter (Signed)
Of note I had called her pain specialist and left a message for them to return to our clinic fax as a letter.  I discussed with the patient at her preop appointment that in order to prescribe postop pain medications a letter will be necessary from her provider indicating been prescribed.  I have previously discussed with the patient that the reason we do not prescribe postop pain medications without information from pain specialist is some providers prefer to prescribe pain medications and arrange postoperative pain

## 2020-03-21 NOTE — Telephone Encounter (Signed)
Patient calling to request something stronger than the hydrocodone. She said she is unable to sleep due to the medication not working. Her surgery was yesterday. Please call patient to advise.

## 2020-03-30 ENCOUNTER — Ambulatory Visit (INDEPENDENT_AMBULATORY_CARE_PROVIDER_SITE_OTHER): Payer: Medicare Other | Admitting: Plastic Surgery

## 2020-03-30 ENCOUNTER — Encounter: Payer: Self-pay | Admitting: Plastic Surgery

## 2020-03-30 ENCOUNTER — Other Ambulatory Visit: Payer: Self-pay

## 2020-03-30 VITALS — BP 133/81 | HR 61 | Temp 98.5°F

## 2020-03-30 DIAGNOSIS — M7989 Other specified soft tissue disorders: Secondary | ICD-10-CM

## 2020-03-30 NOTE — Progress Notes (Signed)
Patient is 2 weeks postop from excision of fat necrosis of the abdomen that occurred after a ventral hernia repair.  She is overall doing well and feels like things are going along fine.  On exam her incision is healing fine.  I do not identify any subcutaneous fluid collections or signs of any complications.  Pathology was reviewed and is consistent with fat necrosis.  I have asked her to wear the binder for another week and then she can discontinue it.  She did want to discuss the potential for cosmetic abdominal contouring.  We briefly discussed this and I think she would be a reasonable candidate for abdominoplasty but she would have some limitations in terms of the upper abdomen dissection and the potential to plicate in that area given her prior abdominal surgeries.  She is to think about this and contact us if she wants to discuss it more.

## 2020-04-05 DIAGNOSIS — Z79899 Other long term (current) drug therapy: Secondary | ICD-10-CM | POA: Diagnosis not present

## 2020-04-05 DIAGNOSIS — M5136 Other intervertebral disc degeneration, lumbar region: Secondary | ICD-10-CM | POA: Diagnosis not present

## 2020-04-05 DIAGNOSIS — G894 Chronic pain syndrome: Secondary | ICD-10-CM | POA: Diagnosis not present

## 2020-04-27 ENCOUNTER — Ambulatory Visit: Payer: Medicare Other | Admitting: Gastroenterology

## 2020-05-03 DIAGNOSIS — G894 Chronic pain syndrome: Secondary | ICD-10-CM | POA: Diagnosis not present

## 2020-05-03 DIAGNOSIS — M5136 Other intervertebral disc degeneration, lumbar region: Secondary | ICD-10-CM | POA: Diagnosis not present

## 2020-05-03 DIAGNOSIS — Z79899 Other long term (current) drug therapy: Secondary | ICD-10-CM | POA: Diagnosis not present

## 2020-05-23 DIAGNOSIS — H40003 Preglaucoma, unspecified, bilateral: Secondary | ICD-10-CM | POA: Diagnosis not present

## 2020-05-30 DIAGNOSIS — Z79899 Other long term (current) drug therapy: Secondary | ICD-10-CM | POA: Diagnosis not present

## 2020-05-30 DIAGNOSIS — M5136 Other intervertebral disc degeneration, lumbar region: Secondary | ICD-10-CM | POA: Diagnosis not present

## 2020-05-30 DIAGNOSIS — G894 Chronic pain syndrome: Secondary | ICD-10-CM | POA: Diagnosis not present

## 2020-06-06 DIAGNOSIS — Z79899 Other long term (current) drug therapy: Secondary | ICD-10-CM | POA: Diagnosis not present

## 2020-06-06 DIAGNOSIS — Z23 Encounter for immunization: Secondary | ICD-10-CM | POA: Diagnosis not present

## 2020-06-06 DIAGNOSIS — E559 Vitamin D deficiency, unspecified: Secondary | ICD-10-CM | POA: Diagnosis not present

## 2020-06-06 DIAGNOSIS — I10 Essential (primary) hypertension: Secondary | ICD-10-CM | POA: Diagnosis not present

## 2020-06-06 DIAGNOSIS — E7800 Pure hypercholesterolemia, unspecified: Secondary | ICD-10-CM | POA: Diagnosis not present

## 2020-06-20 DIAGNOSIS — H16223 Keratoconjunctivitis sicca, not specified as Sjogren's, bilateral: Secondary | ICD-10-CM | POA: Diagnosis not present

## 2020-06-30 DIAGNOSIS — Z79899 Other long term (current) drug therapy: Secondary | ICD-10-CM | POA: Diagnosis not present

## 2020-06-30 DIAGNOSIS — M5136 Other intervertebral disc degeneration, lumbar region: Secondary | ICD-10-CM | POA: Diagnosis not present

## 2020-06-30 DIAGNOSIS — G894 Chronic pain syndrome: Secondary | ICD-10-CM | POA: Diagnosis not present

## 2020-07-31 DIAGNOSIS — G894 Chronic pain syndrome: Secondary | ICD-10-CM | POA: Diagnosis not present

## 2020-07-31 DIAGNOSIS — Z79899 Other long term (current) drug therapy: Secondary | ICD-10-CM | POA: Diagnosis not present

## 2020-07-31 DIAGNOSIS — M5136 Other intervertebral disc degeneration, lumbar region: Secondary | ICD-10-CM | POA: Diagnosis not present

## 2020-07-31 DIAGNOSIS — F17210 Nicotine dependence, cigarettes, uncomplicated: Secondary | ICD-10-CM | POA: Diagnosis not present

## 2020-09-29 ENCOUNTER — Other Ambulatory Visit (HOSPITAL_COMMUNITY): Payer: Self-pay | Admitting: Internal Medicine

## 2020-09-29 DIAGNOSIS — Z1231 Encounter for screening mammogram for malignant neoplasm of breast: Secondary | ICD-10-CM

## 2020-10-13 ENCOUNTER — Ambulatory Visit (HOSPITAL_COMMUNITY): Payer: Medicare Other

## 2020-10-18 ENCOUNTER — Ambulatory Visit (HOSPITAL_COMMUNITY)
Admission: RE | Admit: 2020-10-18 | Discharge: 2020-10-18 | Disposition: A | Discharge status: Home / Self Care | Payer: Medicare Other | Source: Ambulatory Visit | Attending: Internal Medicine | Admitting: Internal Medicine

## 2020-10-18 ENCOUNTER — Other Ambulatory Visit: Payer: Self-pay

## 2020-10-18 DIAGNOSIS — Z1231 Encounter for screening mammogram for malignant neoplasm of breast: Secondary | ICD-10-CM | POA: Diagnosis present

## 2020-12-11 ENCOUNTER — Encounter (HOSPITAL_COMMUNITY): Payer: Self-pay | Admitting: *Deleted

## 2020-12-11 ENCOUNTER — Emergency Department (HOSPITAL_COMMUNITY): Payer: Medicare Other

## 2020-12-11 ENCOUNTER — Other Ambulatory Visit: Payer: Self-pay

## 2020-12-11 ENCOUNTER — Emergency Department (HOSPITAL_COMMUNITY)
Admission: EM | Admit: 2020-12-11 | Discharge: 2020-12-11 | Disposition: A | Discharge status: Home / Self Care | Payer: Medicare Other | Attending: Emergency Medicine | Admitting: Emergency Medicine

## 2020-12-11 DIAGNOSIS — N3000 Acute cystitis without hematuria: Secondary | ICD-10-CM

## 2020-12-11 DIAGNOSIS — Z87891 Personal history of nicotine dependence: Secondary | ICD-10-CM | POA: Insufficient documentation

## 2020-12-11 DIAGNOSIS — Z9049 Acquired absence of other specified parts of digestive tract: Secondary | ICD-10-CM | POA: Insufficient documentation

## 2020-12-11 DIAGNOSIS — R1084 Generalized abdominal pain: Secondary | ICD-10-CM | POA: Diagnosis not present

## 2020-12-11 DIAGNOSIS — K219 Gastro-esophageal reflux disease without esophagitis: Secondary | ICD-10-CM | POA: Insufficient documentation

## 2020-12-11 DIAGNOSIS — I10 Essential (primary) hypertension: Secondary | ICD-10-CM | POA: Diagnosis not present

## 2020-12-11 DIAGNOSIS — D72829 Elevated white blood cell count, unspecified: Secondary | ICD-10-CM | POA: Insufficient documentation

## 2020-12-11 DIAGNOSIS — Z79899 Other long term (current) drug therapy: Secondary | ICD-10-CM | POA: Insufficient documentation

## 2020-12-11 LAB — CBC WITH DIFFERENTIAL/PLATELET
Abs Immature Granulocytes: 0.03 10*3/uL (ref 0.00–0.07)
Basophils Absolute: 0 10*3/uL (ref 0.0–0.1)
Basophils Relative: 1 %
Eosinophils Absolute: 0.2 10*3/uL (ref 0.0–0.5)
Eosinophils Relative: 2 %
HCT: 43.5 % (ref 36.0–46.0)
Hemoglobin: 14.3 g/dL (ref 12.0–15.0)
Immature Granulocytes: 0 %
Lymphocytes Relative: 26 %
Lymphs Abs: 1.9 10*3/uL (ref 0.7–4.0)
MCH: 30.7 pg (ref 26.0–34.0)
MCHC: 32.9 g/dL (ref 30.0–36.0)
MCV: 93.3 fL (ref 80.0–100.0)
Monocytes Absolute: 0.6 10*3/uL (ref 0.1–1.0)
Monocytes Relative: 8 %
Neutro Abs: 4.8 10*3/uL (ref 1.7–7.7)
Neutrophils Relative %: 63 %
Platelets: 289 10*3/uL (ref 150–400)
RBC: 4.66 MIL/uL (ref 3.87–5.11)
RDW: 13.4 % (ref 11.5–15.5)
WBC: 7.5 10*3/uL (ref 4.0–10.5)
nRBC: 0 % (ref 0.0–0.2)

## 2020-12-11 LAB — COMPREHENSIVE METABOLIC PANEL
ALT: 16 U/L (ref 0–44)
AST: 30 U/L (ref 15–41)
Albumin: 3.7 g/dL (ref 3.5–5.0)
Alkaline Phosphatase: 111 U/L (ref 38–126)
Anion gap: 8 (ref 5–15)
BUN: 7 mg/dL (ref 6–20)
CO2: 29 mmol/L (ref 22–32)
Calcium: 9.2 mg/dL (ref 8.9–10.3)
Chloride: 103 mmol/L (ref 98–111)
Creatinine, Ser: 0.72 mg/dL (ref 0.44–1.00)
GFR, Estimated: >60 mL/min (ref >60)
Glucose, Bld: 96 mg/dL (ref 70–99)
Potassium: 3.4 mmol/L — ABNORMAL LOW (ref 3.5–5.1)
Sodium: 140 mmol/L (ref 135–145)
Total Bilirubin: 0.8 mg/dL (ref 0.3–1.2)
Total Protein: 8.5 g/dL — ABNORMAL HIGH (ref 6.5–8.1)

## 2020-12-11 LAB — URINALYSIS, ROUTINE W REFLEX MICROSCOPIC
Bilirubin Urine: NEGATIVE
Glucose, UA: NEGATIVE mg/dL
Hgb urine dipstick: NEGATIVE
Ketones, ur: NEGATIVE mg/dL
Nitrite: POSITIVE — AB
Protein, ur: NEGATIVE mg/dL
Specific Gravity, Urine: 1.017 (ref 1.005–1.030)
pH: 6 (ref 5.0–8.0)

## 2020-12-11 LAB — LIPASE, BLOOD: Lipase: 20 U/L (ref 11–51)

## 2020-12-11 MED ORDER — SODIUM CHLORIDE 0.9 % IV SOLN
1.0000 g | Freq: Once | INTRAVENOUS | Status: AC
  Administered 2020-12-11: 1 g via INTRAVENOUS
  Filled 2020-12-11: qty 10

## 2020-12-11 MED ORDER — CEPHALEXIN 500 MG PO CAPS: 500.0000 mg | ORAL_CAPSULE | Freq: Two times a day (BID) | ORAL | 0 refills | Status: AC

## 2020-12-11 MED ORDER — FENTANYL CITRATE (PF) 100 MCG/2ML IJ SOLN
50.0000 ug | Freq: Once | INTRAMUSCULAR | Status: AC
  Administered 2020-12-11: 50 ug via INTRAVENOUS
  Filled 2020-12-11: qty 2

## 2020-12-11 MED ORDER — HYDROCODONE-ACETAMINOPHEN 5-325 MG PO TABS
1.0000 | ORAL_TABLET | ORAL | Status: AC
  Administered 2020-12-11: 1 via ORAL
  Filled 2020-12-11: qty 1

## 2020-12-11 MED ORDER — IOHEXOL 300 MG/ML  SOLN
100.0000 mL | Freq: Once | INTRAMUSCULAR | Status: AC | PRN
  Administered 2020-12-11: 100 mL via INTRAVENOUS

## 2020-12-11 MED ORDER — FENTANYL CITRATE (PF) 100 MCG/2ML IJ SOLN: 50.0000 ug | Freq: Once | INTRAMUSCULAR | Status: DC

## 2020-12-11 MED ORDER — ONDANSETRON HCL 4 MG/2ML IJ SOLN
4.0000 mg | Freq: Once | INTRAMUSCULAR | Status: AC
  Administered 2020-12-11: 4 mg via INTRAVENOUS
  Filled 2020-12-11: qty 2

## 2020-12-11 NOTE — ED Triage Notes (Signed)
Abdominal pain onset yesterday, denies nausea or vomiting

## 2020-12-11 NOTE — ED Triage Notes (Signed)
Emergency Medicine Provider Triage Evaluation Note  [name] , a 60 y.o. female  was evaluated in triage.  Pt complains of abdominal pain, onset yesterday.  Describes pain as sharp knifelike pains to her mid abdomen, nonradiating.  States pain is constant.  No vomiting, diarrhea or dysuria.  No history of constipation last BM this morning.  She denies fever, chest pains shortness of breath.  Normal brown stools.  Review of Systems  Positive: Abdominal pain Negative: Fever, vomiting, diarrhea  Physical Exam  BP (!) 158/139   Pulse 66   Temp 98.5 F (36.9 C) (Oral)   Resp 18   Ht 5' 2.5" (1.588 m)   SpO2 94%   BMI 37.26 kg/m  Gen:   Awake, no distress   HEENT:  Atraumatic  Resp:  Normal effort  Cardiac:  Normal rate and rhythm Abd:   Diffuse ttp of the mid abdomen.  No guarding or rebound tenderness. MSK:   Moves extremities without difficulty  Neuro:  Speech clear   Medical Decision Making  Medically screening exam initiated at 6:20 PM.  Appropriate orders placed.  [name] was informed that the remainder of the evaluation will be completed by another provider, this initial triage assessment does not replace that evaluation, and the importance of remaining in the ED until their evaluation is complete.  Clinical Impression    Patient here for evaluation of diffuse abdominal pain.  Onset yesterday.  Labs and CT abdomen pelvis ordered.  Patient will need further evaluation in the ER, she is agreeable to plan.   Pauline Aus, PA-C 12/11/20 1824

## 2020-12-11 NOTE — ED Provider Notes (Signed)
[facility] EMERGENCY DEPARTMENT Provider Note   CSN: [id number] Arrival date & time: 12/11/20  1337     History Chief Complaint  Patient presents with  . Abdominal Pain    [name] is a 60 y.o. female.  HPI      [name] is a 60 y.o. female with past medical history of hypertension, anemia, and history of sleeve gastrectomy 2020 and subsequently developed bleeding and required open evacuation of a hematoma who presents to the Emergency Department complaining of diffuse, mid abdominal pain beginning yesterday.  She describes the pain as sharp and stabbing and along the area of her prior abdominal incision.  She states pain is constant and nonradiating, not associated with nausea or vomiting.  No fever or chills.  States that she had a normal bowel movement earlier today.  No bloody or black stools.  She denies chest pain, flank pain or dysuria symptoms.    Past Medical History:  Diagnosis Date  . Anemia   . Anxiety   . Arthritis    back, hands , knees   . Chest pain 2010   due to sarcoidosis- per pt.   . Chronic pain   . Depression   . GERD (gastroesophageal reflux disease)   . Hypercholesterolemia   . Hypertension   . Pneumonia    sarcoidosis- in remission   . Pulmonary sarcoidosis ([place])    Treatment with Prednisone    Patient Active Problem List   Diagnosis Date Noted  . Other hyperlipidemia 07/21/2019  . Other secondary hypertension 07/21/2019  . Encounter for screening mammogram for malignant neoplasm of breast 07/21/2019  . Incisional hernia 07/01/2019  . Postoperative nausea and vomiting 02/24/2019  . Obesity 02/15/2019  . Pneumonia 11/19/2016  . Back pain 11/19/2016  . Constipation 04/27/2012  . GASTROESOPHAGEAL REFLUX DISEASE 06/26/2009  . SARCOIDOSIS, PULMONARY 06/23/2009  . PNEUMONIA 06/23/2009  . CHEST PAIN 06/23/2009    Past Surgical History:  Procedure Laterality Date  . ABDOMINAL HYSTERECTOMY    . APPENDECTOMY    .  CHOLECYSTECTOMY    . EXCISION MASS ABDOMINAL N/A 03/20/2020   Procedure: Excision of anterior abdominal wall fat necrosis;  Surgeon: Cindra Presume, MD;  Location: [place];  Service: Plastics;  Laterality: N/A;  . HERNIA REPAIR    . INCISIONAL HERNIA REPAIR N/A 07/01/2019   Procedure: OPEN INCISIONAL HERNIA REPAIR WITH MESH;  Surgeon: Kinsinger, Arta Bruce, MD;  Location: [place];  Service: General;  Laterality: N/A;  . INSERTION OF MESH N/A 07/01/2019   Procedure: Insertion Of Mesh;  Surgeon: Kinsinger, Arta Bruce, MD;  Location: [place];  Service: General;  Laterality: N/A;  . LAPAROSCOPIC GASTRIC SLEEVE RESECTION N/A 02/15/2019   Procedure: LAPAROSCOPIC GASTRIC SLEEVE RESECTION, UPPER ENDO, ERAS Pathway, Hiatal Hernia Repair;  Surgeon: Kinsinger, Arta Bruce, MD;  Location: [facility];  Service: General;  Laterality: N/A;  . LAPAROSCOPY N/A 02/17/2019   Procedure: LAPAROSCOPY DIAGNOSTIC CONVERTED TO OPEN, EVACUATION OF HEMATOMA AND LIGATION OF VESSEL;  Surgeon: Kinsinger, Arta Bruce, MD;  Location: [facility];  Service: General;  Laterality: N/A;  . LAPAROSCOPY N/A 07/01/2019   Procedure: Laparoscopy Diagnostic;  Surgeon: Kieth Brightly Arta Bruce, MD;  Location: [place];  Service: General;  Laterality: N/A;  . SMALL INTESTINE SURGERY       OB History   No obstetric history on file.     Family History  Problem Relation Age of Onset  . Hypertension Mother   . Hyperlipidemia Mother   .  Lupus Father   . Colon cancer Neg Hx     Social History   Tobacco Use  . Smoking status: Former Smoker    Packs/day: 0.20    Years: 30.00    Pack years: 6.00    Types: Cigarettes    Quit date: 11/19/2012    Years since quitting: 8.0  . Smokeless tobacco: Never Used  Vaping Use  . Vaping Use: Never used  Substance Use Topics  . Alcohol use: No  . Drug use: No    Home Medications Prior to Admission medications   Medication Sig Start Date End Date Taking? Authorizing Provider  amLODipine  (NORVASC) 5 MG tablet Take 5 mg by mouth daily.    [provider]  atorvastatin (LIPITOR) 20 MG tablet Take 20 mg by mouth daily. 03/03/19   [provider]  cyclobenzaprine (FLEXERIL) 10 MG tablet Take 10 mg by mouth 2 (two) times daily as needed. 09/03/19   [provider]  escitalopram (LEXAPRO) 5 MG tablet Take 5 mg by mouth daily.  01/29/19   [provider]  gabapentin (NEURONTIN) 300 MG capsule Take 300 mg by mouth 3 (three) times daily.  01/25/19   [provider]  HYDROcodone-acetaminophen (NORCO) 7.5-325 MG tablet Take 1 tablet by mouth 3 (three) times daily as needed. 10/29/19   [provider]  LINZESS 145 MCG CAPS capsule Take 145 mcg by mouth daily as needed (constipation).  12/31/18   [provider]  Multiple Vitamins-Minerals (WOMENS 50+ [place] VITAMIN/MIN PO) Take 1 tablet by mouth daily.    [provider]  NARCAN 4 MG/0.1ML LIQD nasal spray kit ADMINISTER A SINGLE SPRAYWOF NARCAN INTO ONE NOSTRIL. REPEAT AFTER 3 MINUTES IN OTHER NOSTRIL IF NO RESPONSE. 06/08/19   [provider]    Allergies    Aspirin  Review of Systems   Review of Systems  Constitutional: Negative for appetite change, chills and fever.  Respiratory: Negative for shortness of breath.   Cardiovascular: Negative for chest pain.  Gastrointestinal: Positive for abdominal pain. Negative for abdominal distention, blood in stool, constipation, diarrhea, nausea and vomiting.  Genitourinary: Negative for decreased urine volume, difficulty urinating, dysuria and flank pain.  Musculoskeletal: Negative for back pain.  Skin: Negative for color change and rash.  Neurological: Negative for dizziness, weakness and numbness.  Hematological: Negative for adenopathy.    Physical Exam Updated Vital Signs BP (!) 158/139   Pulse 66   Temp 98.5 F (36.9 C) (Oral)   Resp 18   Ht 5' 2.5" (1.588 m)   SpO2 94%   BMI 37.26 kg/m   Physical  Exam Vitals and nursing note reviewed.  Constitutional:      Appearance: She is obese. She is not toxic-appearing.  HENT:     Mouth/Throat:     Mouth: Mucous membranes are moist.  Cardiovascular:     Rate and Rhythm: Normal rate and regular rhythm.     Pulses: Normal pulses.  Pulmonary:     Effort: Pulmonary effort is normal.     Breath sounds: Normal breath sounds.  Abdominal:     General: Bowel sounds are normal.     Palpations: Abdomen is soft. There is no mass.     Tenderness: There is abdominal tenderness. There is no right CVA tenderness or left CVA tenderness.     Comments: Well-healed midline abdominal scar.  Diffuse tenderness of the abdomen.  Abdomen is soft.  No CVA tenderness  Musculoskeletal:  Right lower leg: No edema.     Left lower leg: No edema.  Skin:    General: Skin is warm.     Capillary Refill: Capillary refill takes less than 2 seconds.     Findings: No rash.  Neurological:     General: No focal deficit present.     Mental Status: She is alert.  Psychiatric:        Mood and Affect: Mood normal.     ED Results / Procedures / Treatments   Labs (all labs ordered are listed, but only abnormal results are displayed) Labs Reviewed  COMPREHENSIVE METABOLIC PANEL - Abnormal; Notable for the following components:      Result Value   Potassium 3.4 (*)    Total Protein 8.5 (*)    All other components within normal limits  URINALYSIS, ROUTINE W REFLEX MICROSCOPIC - Abnormal; Notable for the following components:   APPearance HAZY (*)    Nitrite POSITIVE (*)    Leukocytes,Ua MODERATE (*)    Bacteria, UA MANY (*)    All other components within normal limits  URINE CULTURE  CBC WITH DIFFERENTIAL/PLATELET  LIPASE, BLOOD    EKG None  Radiology No results found.  Procedures Procedures   Medications Ordered in ED Medications  ondansetron (ZOFRAN) injection 4 mg (has no administration in time range)  fentaNYL (SUBLIMAZE) injection 50 mcg (has no  administration in time range)    ED Course  I have reviewed the triage vital signs and the nursing notes.  Pertinent labs & imaging results that were available during my care of the patient were reviewed by me and considered in my medical decision making (see chart for details).    MDM Rules/Calculators/A&P                          Patient here with 1 day history of diffuse abdominal pain.  History of gastric bypass 2 years ago.  Denies any vomiting, diarrhea, fever or chills.  No dysuria symptoms. Seen by me at triage, labs and CT abdomen pelvis ordered at triage.  Labs interpreted by me, no leukocytosis.  No electrolyte derangement.  Lipase unremarkable.  Urinalysis does show positive nitrites and moderate leukocytes.    On further history, patient denies any dysuria symptoms.  No fever, flank pain to suggest pyelonephritis.  Urine culture pending.  Pt denies pain improvement after IV Fentanyl.  Pt being taken to CT, will reassess her pain when she returns  1900  Pt returned, continues to have pain.  Additional medication ordered.  Discussed findings with Eustaquio Maize, PA-C who will review CT A/P findings and arrange dispo.  If CT is w/o acute findings, pt will likely be discharged home and will treat for UTI.    Final Clinical Impression(s) / ED Diagnoses Final diagnoses:  None    Rx / DC Orders ED Discharge Orders    None       Bufford Lope 12/11/20 1906    Margette Fast, MD 12/12/20 1048

## 2020-12-11 NOTE — ED Provider Notes (Signed)
Care assumed from [facility], [place], at shift change, please see their notes for full documentation of patient's complaint/HPI. Briefly, pt here with complaints of gradual onset, constant, diffuse, abdominal pain that began yesterday. Results so far show no leukocytosis, stable hgb. Potassium 3.4. Lipase 20. U/A positive for nitrites and leuks. Awaiting CT A/P. Plan is to dispo accordingly.    Physical Exam  BP (!) 158/139   Pulse 66   Temp 98.5 F (36.9 C) (Oral)   Resp 18   Ht 5' 2.5" (1.588 m)   SpO2 94%   BMI 37.26 kg/m   Physical Exam Vitals and nursing note reviewed.  Constitutional:      Appearance: She is not ill-appearing.  HENT:     Head: Normocephalic and atraumatic.  Eyes:     Conjunctiva/sclera: Conjunctivae normal.  Cardiovascular:     Rate and Rhythm: Normal rate and regular rhythm.  Pulmonary:     Effort: Pulmonary effort is normal.     Breath sounds: Normal breath sounds.  Skin:    General: Skin is warm and dry.     Coloration: Skin is not jaundiced.  Neurological:     Mental Status: She is alert.     ED Course/Procedures     Procedures  Results for orders placed or performed during the hospital encounter of 12/11/20  Comprehensive metabolic panel  Result Value Ref Range   Sodium 140 135 - 145 mmol/L   Potassium 3.4 (L) 3.5 - 5.1 mmol/L   Chloride 103 98 - 111 mmol/L   CO2 29 22 - 32 mmol/L   Glucose, Bld 96 70 - 99 mg/dL   BUN 7 6 - 20 mg/dL   Creatinine, Ser 3.76 0.44 - 1.00 mg/dL   Calcium 9.2 8.9 - 28.3 mg/dL   Total Protein 8.5 (H) 6.5 - 8.1 g/dL   Albumin 3.7 3.5 - 5.0 g/dL   AST 30 15 - 41 U/L   ALT 16 0 - 44 U/L   Alkaline Phosphatase 111 38 - 126 U/L   Total Bilirubin 0.8 0.3 - 1.2 mg/dL   GFR, Estimated >15 >17 mL/min   Anion gap 8 5 - 15  CBC with Differential  Result Value Ref Range   WBC 7.5 4.0 - 10.5 K/uL   RBC 4.66 3.87 - 5.11 MIL/uL   Hemoglobin 14.3 12.0 - 15.0 g/dL   HCT 61.6 07.3 - 71.0 %   MCV 93.3 80.0 - 100.0  fL   MCH 30.7 26.0 - 34.0 pg   MCHC 32.9 30.0 - 36.0 g/dL   RDW 62.6 94.8 - 54.6 %   Platelets 289 150 - 400 K/uL   nRBC 0.0 0.0 - 0.2 %   Neutrophils Relative % 63 %   Neutro Abs 4.8 1.7 - 7.7 K/uL   Lymphocytes Relative 26 %   Lymphs Abs 1.9 0.7 - 4.0 K/uL   Monocytes Relative 8 %   Monocytes Absolute 0.6 0.1 - 1.0 K/uL   Eosinophils Relative 2 %   Eosinophils Absolute 0.2 0.0 - 0.5 K/uL   Basophils Relative 1 %   Basophils Absolute 0.0 0.0 - 0.1 K/uL   Immature Granulocytes 0 %   Abs Immature Granulocytes 0.03 0.00 - 0.07 K/uL  Urinalysis, Routine w reflex microscopic Urine, Clean Catch  Result Value Ref Range   Color, Urine YELLOW YELLOW   APPearance HAZY (A) CLEAR   Specific Gravity, Urine 1.017 1.005 - 1.030   pH 6.0 5.0 - 8.0   Glucose,  UA NEGATIVE NEGATIVE mg/dL   Hgb urine dipstick NEGATIVE NEGATIVE   Bilirubin Urine NEGATIVE NEGATIVE   Ketones, ur NEGATIVE NEGATIVE mg/dL   Protein, ur NEGATIVE NEGATIVE mg/dL   Nitrite POSITIVE (A) NEGATIVE   Leukocytes,Ua MODERATE (A) NEGATIVE   RBC / HPF 0-5 0 - 5 RBC/hpf   WBC, UA 11-20 0 - 5 WBC/hpf   Bacteria, UA MANY (A) NONE SEEN   Squamous Epithelial / LPF 6-10 0 - 5   Mucus PRESENT   Lipase, blood  Result Value Ref Range   Lipase 20 11 - 51 U/L   CT ABDOMEN PELVIS W CONTRAST  Result Date: 12/11/2020 CLINICAL DATA:  Acute abdominal pain. History of gastric bypass surgery, hernia pair, cholecystectomy, appendectomy and hysterectomy. EXAM: CT ABDOMEN AND PELVIS WITH CONTRAST TECHNIQUE: Multidetector CT imaging of the abdomen and pelvis was performed using the standard protocol following bolus administration of intravenous contrast. CONTRAST:  OMNIPAQUE IOHEXOL 300 MG/ML  SOLN COMPARISON:  CT 09/22/2019 FINDINGS: Lower chest: Band like atelectasis at the LEFT lung base Hepatobiliary: No focal hepatic lesion. Postcholecystectomy. No biliary dilatation. Low attenuation along the RIGHT hepatic lobe likely represent focal  fatty infiltration (image 37/2). Unchanged from comparison exam. Pancreas: Pancreas is normal. No ductal dilatation. No pancreatic inflammation. Spleen: Normal spleen Adrenals/urinary tract: Adrenal glands and kidneys are normal. The ureters and bladder normal. Stomach/Bowel: Post bariatric surgery gas sleeve anatomy. Duodenum and small-bowel normal. Post appendectomy. The colon and rectosigmoid colon are normal. Vascular/Lymphatic: Abdominal aorta is normal caliber with atherosclerotic calcification. There is no retroperitoneal or periportal lymphadenopathy. No pelvic lymphadenopathy. Reproductive: Post hysterectomy.  Adnexa unremarkable Other: Midline ventral hernia above the umbilicus is wide-mouth at 2.6 cm. Hernia sac measures 5.4 cm. Retroperitoneal fat within the hernia sac without inflammation. Musculoskeletal: No aggressive osseous lesion. IMPRESSION: 1. No acute abdominopelvic findings. 2. Moderate size ventral hernia. Electronically Signed   By: Genevive Bi M.D.   On: 12/11/2020 19:17    MDM  CT scan without acute findings at this time. Moderate ventral hernia with retroperitoneal fat appreciated; no concern for incarcerated or strangulated hernia at this time. Pt reports previous hernia repair; have advised follow up with her surgeon for same. Will treat for UTI at this time. Pt endorses no improvement after fentanyl; she is chronically on 7.5 Percocet TID; had initially ordered regular pain meds however we only carry 5 mg Percocet here; have provided. Will treat with rocephin and discharge home with keflex. Pt instructed to follow up with PCP. She is in agreement with plan; stable for discharge after 1 round of IV abx.   This note was prepared using Dragon voice recognition software and may include unintentional dictation errors due to the inherent limitations of voice recognition software.       Tanda Rockers, PA-C 12/11/20 2030    Maia Plan, MD 12/12/20 1048

## 2020-12-11 NOTE — Discharge Instructions (Addendum)
Please pick up antibiotics and take as prescribed. We have sent your urine for culture and will call you in 2-3 days time IF the antibiotic needs to be changed.   Follow up with your PCP regarding your ED visit today  Return to the ED for any worsening symptoms

## 2020-12-14 LAB — URINE CULTURE: Culture: >=100000 — AB

## 2020-12-15 ENCOUNTER — Telehealth: Payer: Self-pay | Admitting: *Deleted

## 2020-12-15 NOTE — Telephone Encounter (Signed)
Post ED Visit - Positive Culture Follow-up  Culture report reviewed by antimicrobial stewardship pharmacist: [facility] []  , Pharm.D. []  Enzo Bi, .D., BCPS AQ-ID []  Celedonio Miyamoto, Pharm.D., BCPS []  [address], Pharm.D., BCPS []  [place], Garvin Fila.D., BCPS, AAHIVP []  , Pharm.D., BCPS, AAHIVP []  Georgina Pillion, PharmD, BCPS []  , PharmD, BCPS []  [place], PharmD, BCPS []  [address], PharmD []  , PharmD, BCPS []  Estella Husk, PharmD  Pharmacy Team []  Lysle Pearl, PharmD []  , PharmD []  Phillips Climes, PharmD []  , Rph []  Agapito Games) , PharmD []  Verlan Friends, PharmD []  , PharmD []  Mervyn Gay, PharmD []  , PharmD []  Vinnie Level, PharmD []  [facility], PharmD []  , PharmD []  Len Childs, PharmD   Positive urine culture Treated with Cephalexin, organism sensitive to the same and no further patient follow-up is required at this time.  [facility] PharmD  Greer Pickerel Talley 12/15/2020, 9:47 AM

## 2021-02-13 ENCOUNTER — Encounter (HOSPITAL_COMMUNITY): Payer: Self-pay | Admitting: General Surgery

## 2021-02-13 ENCOUNTER — Other Ambulatory Visit: Payer: Self-pay

## 2021-02-13 NOTE — Progress Notes (Signed)
COVID Vaccine Completed: Yes Date COVID Vaccine completed: 11/24/19 Has received booster: x1 COVID vaccine manufacturer:    Moderna     Date of COVID positive in last 90 days: No  PCP - [facility] Cardiologist - N/A Pulmonologist-Wert, Charlaine Dalton, MD  last office visit note 12/16/2019 in epic  Chest x-ray - greater than 1 year in epic EKG - 03/16/2020 in epic Stress Test - N/A ECHO - N/A Cardiac Cath - N/A Pacemaker/ICD device last checked: N/A  Sleep Study - N/A CPAP - N/A  Fasting Blood Sugar - N/A Checks Blood Sugar ___N/A__ times a day  Blood Thinner Instructions: N/A Aspirin Instructions: N/A Last Dose: N/A  Activity level:  Able to exercise without symptoms     Anesthesia review: Pulmonary Sarcoidosis  Patient denies shortness of breath, fever, cough and chest pain at PAT appointment   Patient verbalized understanding of instructions that were given to them at the PAT appointment. Patient was also instructed that they will need to review over the PAT instructions again at home before surgery.

## 2021-02-15 ENCOUNTER — Ambulatory Visit: Payer: Self-pay | Admitting: General Surgery

## 2021-02-16 ENCOUNTER — Other Ambulatory Visit (HOSPITAL_COMMUNITY)
Admission: RE | Admit: 2021-02-16 | Discharge: 2021-02-16 | Disposition: A | Discharge status: Home / Self Care | Payer: Medicare Other | Source: Ambulatory Visit | Attending: General Surgery | Admitting: General Surgery

## 2021-02-16 DIAGNOSIS — Z01812 Encounter for preprocedural laboratory examination: Secondary | ICD-10-CM | POA: Diagnosis present

## 2021-02-16 DIAGNOSIS — Z20822 Contact with and (suspected) exposure to covid-19: Secondary | ICD-10-CM | POA: Diagnosis not present

## 2021-02-17 LAB — SARS CORONAVIRUS 2 (TAT 6-24 HRS): SARS Coronavirus 2: NEGATIVE

## 2021-02-20 ENCOUNTER — Other Ambulatory Visit: Payer: Self-pay | Admitting: Internal Medicine

## 2021-02-20 ENCOUNTER — Ambulatory Visit (HOSPITAL_COMMUNITY): Payer: Medicare Other | Admitting: Emergency Medicine

## 2021-02-20 ENCOUNTER — Other Ambulatory Visit: Payer: Self-pay

## 2021-02-20 ENCOUNTER — Encounter (HOSPITAL_COMMUNITY)
Admission: RE | Disposition: A | Discharge status: Home / Self Care | Payer: Self-pay | Source: Home / Self Care | Attending: General Surgery

## 2021-02-20 ENCOUNTER — Inpatient Hospital Stay (HOSPITAL_COMMUNITY)
Admission: RE | Admit: 2021-02-20 | Discharge: 2021-02-25 | DRG: 355 | Disposition: A | Discharge status: Home / Self Care | Payer: Medicare Other | Attending: General Surgery | Admitting: General Surgery

## 2021-02-20 ENCOUNTER — Encounter (HOSPITAL_COMMUNITY): Payer: Self-pay | Admitting: General Surgery

## 2021-02-20 DIAGNOSIS — I10 Essential (primary) hypertension: Secondary | ICD-10-CM | POA: Diagnosis present

## 2021-02-20 DIAGNOSIS — Z90710 Acquired absence of both cervix and uterus: Secondary | ICD-10-CM

## 2021-02-20 DIAGNOSIS — G8929 Other chronic pain: Secondary | ICD-10-CM | POA: Diagnosis present

## 2021-02-20 DIAGNOSIS — F419 Anxiety disorder, unspecified: Secondary | ICD-10-CM | POA: Diagnosis present

## 2021-02-20 DIAGNOSIS — Z87891 Personal history of nicotine dependence: Secondary | ICD-10-CM

## 2021-02-20 DIAGNOSIS — Z79899 Other long term (current) drug therapy: Secondary | ICD-10-CM

## 2021-02-20 DIAGNOSIS — Z886 Allergy status to analgesic agent status: Secondary | ICD-10-CM

## 2021-02-20 DIAGNOSIS — R911 Solitary pulmonary nodule: Secondary | ICD-10-CM

## 2021-02-20 DIAGNOSIS — K660 Peritoneal adhesions (postprocedural) (postinfection): Secondary | ICD-10-CM | POA: Diagnosis present

## 2021-02-20 DIAGNOSIS — K432 Incisional hernia without obstruction or gangrene: Principal | ICD-10-CM | POA: Diagnosis present

## 2021-02-20 DIAGNOSIS — E7800 Pure hypercholesterolemia, unspecified: Secondary | ICD-10-CM | POA: Diagnosis present

## 2021-02-20 DIAGNOSIS — R109 Unspecified abdominal pain: Secondary | ICD-10-CM

## 2021-02-20 DIAGNOSIS — M1990 Unspecified osteoarthritis, unspecified site: Secondary | ICD-10-CM | POA: Diagnosis present

## 2021-02-20 DIAGNOSIS — F32A Depression, unspecified: Secondary | ICD-10-CM | POA: Diagnosis present

## 2021-02-20 DIAGNOSIS — Z8249 Family history of ischemic heart disease and other diseases of the circulatory system: Secondary | ICD-10-CM

## 2021-02-20 DIAGNOSIS — Z9049 Acquired absence of other specified parts of digestive tract: Secondary | ICD-10-CM

## 2021-02-20 DIAGNOSIS — K219 Gastro-esophageal reflux disease without esophagitis: Secondary | ICD-10-CM | POA: Diagnosis present

## 2021-02-20 HISTORY — PX: INCISIONAL HERNIA REPAIR: SHX193

## 2021-02-20 LAB — CBC
HCT: 41.3 % (ref 36.0–46.0)
Hemoglobin: 14 g/dL (ref 12.0–15.0)
MCH: 30.4 pg (ref 26.0–34.0)
MCHC: 33.9 g/dL (ref 30.0–36.0)
MCV: 89.6 fL (ref 80.0–100.0)
Platelets: 257 10*3/uL (ref 150–400)
RBC: 4.61 MIL/uL (ref 3.87–5.11)
RDW: 14.6 % (ref 11.5–15.5)
WBC: 6.5 10*3/uL (ref 4.0–10.5)
nRBC: 0 % (ref 0.0–0.2)

## 2021-02-20 LAB — BASIC METABOLIC PANEL
Anion gap: 9 (ref 5–15)
BUN: 8 mg/dL (ref 6–20)
CO2: 27 mmol/L (ref 22–32)
Calcium: 10.2 mg/dL (ref 8.9–10.3)
Chloride: 103 mmol/L (ref 98–111)
Creatinine, Ser: 0.62 mg/dL (ref 0.44–1.00)
GFR, Estimated: >60 mL/min (ref >60)
Glucose, Bld: 98 mg/dL (ref 70–99)
Potassium: 3.3 mmol/L — ABNORMAL LOW (ref 3.5–5.1)
Sodium: 139 mmol/L (ref 135–145)

## 2021-02-20 SURGERY — REPAIR, HERNIA, INCISIONAL, LAPAROSCOPIC
Anesthesia: General | Site: Abdomen

## 2021-02-20 MED ORDER — EPHEDRINE 5 MG/ML INJ
INTRAVENOUS | Status: AC
  Filled 2021-02-20: qty 10

## 2021-02-20 MED ORDER — FENTANYL CITRATE (PF) 100 MCG/2ML IJ SOLN
INTRAMUSCULAR | Status: DC | PRN
  Administered 2021-02-20: 50 ug via INTRAVENOUS
  Administered 2021-02-20: 100 ug via INTRAVENOUS
  Administered 2021-02-20 (×4): 50 ug via INTRAVENOUS

## 2021-02-20 MED ORDER — AMLODIPINE BESYLATE 5 MG PO TABS
5.0000 mg | ORAL_TABLET | Freq: Every day | ORAL | Status: DC
  Administered 2021-02-21 – 2021-02-25 (×5): 5 mg via ORAL
  Filled 2021-02-20 (×5): qty 1

## 2021-02-20 MED ORDER — KCL IN DEXTROSE-NACL 20-5-0.45 MEQ/L-%-% IV SOLN
INTRAVENOUS | Status: DC
  Filled 2021-02-20 (×2): qty 1000

## 2021-02-20 MED ORDER — BUPIVACAINE-EPINEPHRINE (PF) 0.25% -1:200000 IJ SOLN
INTRAMUSCULAR | Status: AC
  Filled 2021-02-20: qty 30

## 2021-02-20 MED ORDER — SUGAMMADEX SODIUM 200 MG/2ML IV SOLN
INTRAVENOUS | Status: DC | PRN
  Administered 2021-02-20: 200 mg via INTRAVENOUS

## 2021-02-20 MED ORDER — LIDOCAINE HCL 2 % IJ SOLN
INTRAMUSCULAR | Status: AC
  Filled 2021-02-20: qty 20

## 2021-02-20 MED ORDER — OXYCODONE HCL 5 MG PO TABS
5.0000 mg | ORAL_TABLET | ORAL | Status: DC | PRN
  Administered 2021-02-20 – 2021-02-21 (×2): 5 mg via ORAL
  Administered 2021-02-21 (×2): 10 mg via ORAL
  Administered 2021-02-21: 5 mg via ORAL
  Administered 2021-02-22 (×2): 10 mg via ORAL
  Filled 2021-02-20 (×2): qty 1
  Filled 2021-02-20 (×3): qty 2
  Filled 2021-02-20: qty 1
  Filled 2021-02-20: qty 2

## 2021-02-20 MED ORDER — LACTATED RINGERS IV SOLN: INTRAVENOUS | Status: DC

## 2021-02-20 MED ORDER — CHLORHEXIDINE GLUCONATE CLOTH 2 % EX PADS: 6.0000 | MEDICATED_PAD | Freq: Once | CUTANEOUS | Status: DC

## 2021-02-20 MED ORDER — ONDANSETRON HCL 4 MG/2ML IJ SOLN
INTRAMUSCULAR | Status: AC
  Filled 2021-02-20: qty 2

## 2021-02-20 MED ORDER — SCOPOLAMINE 1 MG/3DAYS TD PT72
MEDICATED_PATCH | TRANSDERMAL | Status: AC
  Filled 2021-02-20: qty 1

## 2021-02-20 MED ORDER — HYDROMORPHONE HCL 1 MG/ML IJ SOLN
INTRAMUSCULAR | Status: AC
  Filled 2021-02-20: qty 1

## 2021-02-20 MED ORDER — FENTANYL CITRATE (PF) 100 MCG/2ML IJ SOLN
25.0000 ug | INTRAMUSCULAR | Status: DC | PRN
  Administered 2021-02-20 (×2): 50 ug via INTRAVENOUS

## 2021-02-20 MED ORDER — SODIUM CHLORIDE 0.9 % IV SOLN
2.0000 g | INTRAVENOUS | Status: AC
  Administered 2021-02-20: 2 g via INTRAVENOUS
  Filled 2021-02-20: qty 2

## 2021-02-20 MED ORDER — MIDAZOLAM HCL 2 MG/2ML IJ SOLN
INTRAMUSCULAR | Status: AC
  Filled 2021-02-20: qty 2

## 2021-02-20 MED ORDER — DEXAMETHASONE SODIUM PHOSPHATE 10 MG/ML IJ SOLN
INTRAMUSCULAR | Status: DC | PRN
  Administered 2021-02-20: 10 mg via INTRAVENOUS

## 2021-02-20 MED ORDER — FENTANYL CITRATE (PF) 100 MCG/2ML IJ SOLN
INTRAMUSCULAR | Status: AC
  Filled 2021-02-20: qty 2

## 2021-02-20 MED ORDER — ONDANSETRON HCL 4 MG/2ML IJ SOLN
INTRAMUSCULAR | Status: DC | PRN
  Administered 2021-02-20: 4 mg via INTRAVENOUS

## 2021-02-20 MED ORDER — ROCURONIUM BROMIDE 10 MG/ML (PF) SYRINGE
PREFILLED_SYRINGE | INTRAVENOUS | Status: DC | PRN
  Administered 2021-02-20: 60 mg via INTRAVENOUS

## 2021-02-20 MED ORDER — BUPIVACAINE-EPINEPHRINE 0.25% -1:200000 IJ SOLN
INTRAMUSCULAR | Status: DC | PRN
  Administered 2021-02-20: 30 mL

## 2021-02-20 MED ORDER — ONDANSETRON HCL 4 MG/2ML IJ SOLN
4.0000 mg | Freq: Four times a day (QID) | INTRAMUSCULAR | Status: DC | PRN
  Administered 2021-02-22 (×2): 4 mg via INTRAVENOUS
  Filled 2021-02-20 (×2): qty 2

## 2021-02-20 MED ORDER — ONDANSETRON 4 MG PO TBDP: 4.0000 mg | ORAL_TABLET | Freq: Four times a day (QID) | ORAL | Status: DC | PRN

## 2021-02-20 MED ORDER — LIDOCAINE 20MG/ML (2%) 15 ML SYRINGE OPTIME
INTRAMUSCULAR | Status: DC | PRN
  Administered 2021-02-20: 1.5 mg/kg/h via INTRAVENOUS

## 2021-02-20 MED ORDER — OXYCODONE HCL 5 MG/5ML PO SOLN: 5.0000 mg | Freq: Once | ORAL | Status: DC | PRN

## 2021-02-20 MED ORDER — KETAMINE HCL 10 MG/ML IJ SOLN
INTRAMUSCULAR | Status: DC | PRN
  Administered 2021-02-20: 30 mg via INTRAVENOUS

## 2021-02-20 MED ORDER — HYDRALAZINE HCL 10 MG PO TABS
10.0000 mg | ORAL_TABLET | Freq: Four times a day (QID) | ORAL | Status: DC | PRN
  Filled 2021-02-20: qty 1

## 2021-02-20 MED ORDER — KETAMINE HCL 10 MG/ML IJ SOLN
INTRAMUSCULAR | Status: AC
  Filled 2021-02-20: qty 1

## 2021-02-20 MED ORDER — LACTATED RINGERS IR SOLN
Status: DC | PRN
  Administered 2021-02-20: 1000 mL

## 2021-02-20 MED ORDER — GABAPENTIN 300 MG PO CAPS
300.0000 mg | ORAL_CAPSULE | Freq: Two times a day (BID) | ORAL | Status: DC
  Administered 2021-02-20 – 2021-02-25 (×10): 300 mg via ORAL
  Filled 2021-02-20 (×10): qty 1

## 2021-02-20 MED ORDER — MIDAZOLAM HCL 5 MG/5ML IJ SOLN
INTRAMUSCULAR | Status: DC | PRN
  Administered 2021-02-20: 2 mg via INTRAVENOUS

## 2021-02-20 MED ORDER — ROCURONIUM BROMIDE 10 MG/ML (PF) SYRINGE
PREFILLED_SYRINGE | INTRAVENOUS | Status: AC
  Filled 2021-02-20: qty 10

## 2021-02-20 MED ORDER — ORAL CARE MOUTH RINSE: 15.0000 mL | Freq: Once | OROMUCOSAL | Status: AC

## 2021-02-20 MED ORDER — OXYCODONE HCL 5 MG PO TABS: 5.0000 mg | ORAL_TABLET | Freq: Once | ORAL | Status: DC | PRN

## 2021-02-20 MED ORDER — PROMETHAZINE HCL 25 MG/ML IJ SOLN: 6.2500 mg | INTRAMUSCULAR | Status: DC | PRN

## 2021-02-20 MED ORDER — DEXAMETHASONE SODIUM PHOSPHATE 10 MG/ML IJ SOLN
INTRAMUSCULAR | Status: AC
  Filled 2021-02-20: qty 1

## 2021-02-20 MED ORDER — ENSURE PRE-SURGERY PO LIQD
296.0000 mL | Freq: Once | ORAL | Status: DC
  Filled 2021-02-20: qty 296

## 2021-02-20 MED ORDER — LIDOCAINE 2% (20 MG/ML) 5 ML SYRINGE
INTRAMUSCULAR | Status: DC | PRN
  Administered 2021-02-20: 60 mg via INTRAVENOUS

## 2021-02-20 MED ORDER — MORPHINE SULFATE (PF) 2 MG/ML IV SOLN
2.0000 mg | INTRAVENOUS | Status: DC | PRN
  Administered 2021-02-20 – 2021-02-22 (×6): 2 mg via INTRAVENOUS
  Filled 2021-02-20 (×6): qty 1

## 2021-02-20 MED ORDER — CHLORHEXIDINE GLUCONATE 0.12 % MT SOLN
15.0000 mL | Freq: Once | OROMUCOSAL | Status: AC
  Administered 2021-02-20: 15 mL via OROMUCOSAL

## 2021-02-20 MED ORDER — FENTANYL CITRATE (PF) 100 MCG/2ML IJ SOLN
INTRAMUSCULAR | Status: AC
  Administered 2021-02-20: 50 ug via INTRAVENOUS
  Filled 2021-02-20: qty 2

## 2021-02-20 MED ORDER — PHENYLEPHRINE 40 MCG/ML (10ML) SYRINGE FOR IV PUSH (FOR BLOOD PRESSURE SUPPORT)
PREFILLED_SYRINGE | INTRAVENOUS | Status: DC | PRN
  Administered 2021-02-20 (×2): 80 ug via INTRAVENOUS

## 2021-02-20 MED ORDER — FENTANYL CITRATE (PF) 250 MCG/5ML IJ SOLN
INTRAMUSCULAR | Status: AC
  Filled 2021-02-20: qty 5

## 2021-02-20 MED ORDER — EPHEDRINE SULFATE-NACL 50-0.9 MG/10ML-% IV SOSY
PREFILLED_SYRINGE | INTRAVENOUS | Status: DC | PRN
  Administered 2021-02-20 (×2): 10 mg via INTRAVENOUS

## 2021-02-20 MED ORDER — PROPOFOL 10 MG/ML IV BOLUS
INTRAVENOUS | Status: DC | PRN
  Administered 2021-02-20: 200 mg via INTRAVENOUS

## 2021-02-20 MED ORDER — ENOXAPARIN SODIUM 40 MG/0.4ML IJ SOSY
40.0000 mg | PREFILLED_SYRINGE | INTRAMUSCULAR | Status: DC
  Administered 2021-02-21 – 2021-02-25 (×5): 40 mg via SUBCUTANEOUS
  Filled 2021-02-20 (×5): qty 0.4

## 2021-02-20 MED ORDER — KETOROLAC TROMETHAMINE 15 MG/ML IJ SOLN
15.0000 mg | Freq: Four times a day (QID) | INTRAMUSCULAR | Status: DC | PRN
  Administered 2021-02-20 – 2021-02-25 (×11): 15 mg via INTRAVENOUS
  Filled 2021-02-20 (×12): qty 1

## 2021-02-20 MED ORDER — PROPOFOL 10 MG/ML IV BOLUS
INTRAVENOUS | Status: AC
  Filled 2021-02-20: qty 20

## 2021-02-20 MED ORDER — ACETAMINOPHEN 500 MG PO TABS
1000.0000 mg | ORAL_TABLET | ORAL | Status: AC
  Administered 2021-02-20: 1000 mg via ORAL
  Filled 2021-02-20: qty 2

## 2021-02-20 MED ORDER — HYDROMORPHONE HCL 1 MG/ML IJ SOLN
0.5000 mg | INTRAMUSCULAR | Status: AC | PRN
Start: 2021-02-20 — End: 2021-02-20
  Administered 2021-02-20 (×3): 0.5 mg via INTRAVENOUS

## 2021-02-20 SURGICAL SUPPLY — 47 items
ADH SKN CLS APL DERMABOND .7 (GAUZE/BANDAGES/DRESSINGS) ×1
APL PRP STRL LF DISP 70% ISPRP (MISCELLANEOUS) ×1
APL SKNCLS STERI-STRIP NONHPOA (GAUZE/BANDAGES/DRESSINGS) ×1
APPLIER CLIP 5 13 M/L LIGAMAX5 (MISCELLANEOUS)
APR CLP MED LRG 5 ANG JAW (MISCELLANEOUS)
BAG COUNTER SPONGE SURGICOUNT (BAG) IMPLANT
BAG SPNG CNTER NS LX DISP (BAG)
BENZOIN TINCTURE PRP APPL 2/3 (GAUZE/BANDAGES/DRESSINGS) ×2 IMPLANT
BINDER ABDOMINAL 12 ML 46-62 (SOFTGOODS) IMPLANT
BNDG ADH 1X3 SHEER STRL LF (GAUZE/BANDAGES/DRESSINGS) ×6 IMPLANT
BNDG ADH THN 3X1 STRL LF (GAUZE/BANDAGES/DRESSINGS) ×3
CABLE HIGH FREQUENCY MONO STRZ (ELECTRODE) ×2 IMPLANT
CHLORAPREP W/TINT 26 (MISCELLANEOUS) ×2 IMPLANT
CLIP APPLIE 5 13 M/L LIGAMAX5 (MISCELLANEOUS) IMPLANT
COVER SURGICAL LIGHT HANDLE (MISCELLANEOUS) ×2 IMPLANT
DECANTER SPIKE VIAL GLASS SM (MISCELLANEOUS) ×1 IMPLANT
DERMABOND ADVANCED (GAUZE/BANDAGES/DRESSINGS) ×1
DERMABOND ADVANCED .7 DNX12 (GAUZE/BANDAGES/DRESSINGS) IMPLANT
DEVICE SECURE STRAP 25 ABSORB (INSTRUMENTS) ×2 IMPLANT
DRAIN CHANNEL 19F RND (DRAIN) IMPLANT
ELECT REM PT RETURN 15FT ADLT (MISCELLANEOUS) ×2 IMPLANT
EVACUATOR SILICONE 100CC (DRAIN) IMPLANT
GLOVE SURG POLYISO LF SZ7 (GLOVE) ×2 IMPLANT
GLOVE SURG UNDER POLY LF SZ7 (GLOVE) ×2 IMPLANT
GOWN STRL REUS W/TWL LRG LVL3 (GOWN DISPOSABLE) ×2 IMPLANT
GOWN STRL REUS W/TWL XL LVL3 (GOWN DISPOSABLE) ×4 IMPLANT
GRASPER SUT TROCAR 14GX15 (MISCELLANEOUS) ×2 IMPLANT
KIT BASIN OR (CUSTOM PROCEDURE TRAY) ×2 IMPLANT
KIT TURNOVER KIT A (KITS) ×2 IMPLANT
MESH VENTRALIGHT ST 6X8 (Mesh General) ×2 IMPLANT
MESH VENTRLGHT ELIP 8X6XMFL (Mesh General) IMPLANT
PENCIL SMOKE EVACUATOR (MISCELLANEOUS) IMPLANT
SCISSORS LAP 5X35 DISP (ENDOMECHANICALS) ×2 IMPLANT
SET IRRIG TUBING LAPAROSCOPIC (IRRIGATION / IRRIGATOR) ×1 IMPLANT
SET TUBE SMOKE EVAC HIGH FLOW (TUBING) ×2 IMPLANT
SHEARS HARMONIC ACE PLUS 36CM (ENDOMECHANICALS) ×1 IMPLANT
SLEEVE XCEL OPT CAN 5 100 (ENDOMECHANICALS) ×2 IMPLANT
STRIP CLOSURE SKIN 1/2X4 (GAUZE/BANDAGES/DRESSINGS) ×2 IMPLANT
SUT ETHILON 2 0 PS N (SUTURE) IMPLANT
SUT MNCRL AB 4-0 PS2 18 (SUTURE) ×2 IMPLANT
SUT NOVA NAB GS-21 0 18 T12 DT (SUTURE) ×4 IMPLANT
SUT PDS AB 0 CT 36 (SUTURE) ×2 IMPLANT
SUT VICRYL 0 UR6 27IN ABS (SUTURE) IMPLANT
TOWEL OR 17X26 10 PK STRL BLUE (TOWEL DISPOSABLE) ×2 IMPLANT
TRAY LAPAROSCOPIC (CUSTOM PROCEDURE TRAY) ×2 IMPLANT
TROCAR BLADELESS OPT 5 100 (ENDOMECHANICALS) ×2 IMPLANT
TROCAR XCEL 12X100 BLDLESS (ENDOMECHANICALS) ×2 IMPLANT

## 2021-02-20 NOTE — Transfer of Care (Signed)
Immediate Anesthesia Transfer of Care Note  Patient: [name]  Procedure(s) Performed: LAPAROSCOPIC RECURRENT INCISIONAL HERNIA REPAIR WITH MESH (Abdomen)  Patient Location: PACU  Anesthesia Type:General  Level of Consciousness: awake, alert  and oriented  Airway & Oxygen Therapy: Patient Spontanous Breathing and Patient connected to face mask oxygen  Post-op Assessment: Report given to RN and Post -op Vital signs reviewed and stable  Post vital signs: Reviewed and stable  Last Vitals:  Vitals Value Taken Time  BP    Temp    Pulse 67 02/20/21 1220  Resp 17 02/20/21 1220  SpO2 99 % 02/20/21 1220  Vitals shown include unvalidated device data.  Last Pain:  Vitals:   02/20/21 0846  TempSrc: Oral  PainSc: 4       Patients Stated Pain Goal: 2 (02/20/21 0846)  Complications: No notable events documented.

## 2021-02-20 NOTE — Anesthesia Preprocedure Evaluation (Addendum)
Anesthesia Evaluation    Reviewed: Allergy & Precautions, Patient's Chart, lab work & pertinent test results  History of Anesthesia Complications (+) PONV and history of anesthetic complications  Airway Mallampati: III  TM Distance: >3 FB Neck ROM: Full    Dental  (+) Edentulous Lower, Edentulous Upper   Pulmonary former smoker,   Sarcoidosis    Pulmonary exam normal        Cardiovascular hypertension, Pt. on medications Normal cardiovascular exam     Neuro/Psych PSYCHIATRIC DISORDERS Anxiety Depression negative neurological ROS     GI/Hepatic Neg liver ROS, GERD  Controlled,  Endo/Other   Obesity   Renal/GU negative Renal ROS     Musculoskeletal  (+) Arthritis ,   Abdominal   Peds  Hematology negative hematology ROS (+)   Anesthesia Other Findings Covid test negative   Reproductive/Obstetrics  s/p hysterectomy                            Anesthesia Physical Anesthesia Plan  ASA: 2  Anesthesia Plan: General   Post-op Pain Management:    Induction: Intravenous  PONV Risk Score and Plan: 4 or greater and Treatment may vary due to age or medical condition, Ondansetron, Midazolam, Dexamethasone and Scopolamine patch - Pre-op  Airway Management Planned: Oral ETT  Additional Equipment: None  Intra-op Plan:   Post-operative Plan: Extubation in OR  Informed Consent: I have reviewed the patients History and Physical, chart, labs and discussed the procedure including the risks, benefits and alternatives for the proposed anesthesia with the patient or authorized representative who has indicated his/her understanding and acceptance.     Dental advisory given  Plan Discussed with: CRNA and Anesthesiologist  Anesthesia Plan Comments:        Anesthesia Quick Evaluation

## 2021-02-20 NOTE — Op Note (Addendum)
Operative Report  Preoperative diagnosis: Recurrent incisional hernia without obstruction or gangrene  Postoperative diagnosis: Same   Procedure: diagnostic laparoscopy, lysis of adhesions, laparoscopic recurrent incisional hernia repair with placement of underlay mesh  Surgeon: Feliciana Rossetti, MD  Asst: Alfonso Patten, MD MPH - resident  Anesthesia: General/Endoracheal  Indications for procedure: [name] is a 60 y.o. female who had undergone sleeve gastrectomy complicated by bleeding requiring laparotomy. She developed a hernia, which was then repaired in the fall of 2020 using an open approach wit ha mesh placed in the retrorectus space, which has now recurred to the right lateral aspect. It is palpable but not completely reducible on exam.  Procedure in detail:  The patient was identified in the holding area and after ascertaining informed consent, was brought into the operative suite and placed supine. Anesthesia was administered via endotracheal tube. Patient was strapped in place and foot board was secured. Both arms were tucked. All pressure points were offloaded by foam padding. The patient was prepped and draped in the usual sterile fashion. A time out was conducted confirming the correct patient, procedure, and necessary materials.  A small incision was made over the left subcostal area and 26mm trocar was placed using the Optiview entry maneuver. Pneumoperitoneum was applied with high flow low pressure without any hemodynamic changes. The abdominal cavity was inspected and multiple adhesions to the ventral abdominal wall were noted. One 6mm trocar was placed in the mid left abdomen and an additional 92mm trocar was placed in the LLQ.  Bilateral TAP blocks were placed with Marcaine with Epinephrine.  Blunt dissection was used to remove most of the filmy adhesion and safe occasional sharp dissection to access the edges of the hernia defect. Cautery was used to provide  hemostasis. Total lysis of adhesions took approximately 25 minutes. The hernia was identified and was filled with omental fat and several loops of small bowel. It was slowly dissected free and reduced; no serosal tears or bleeding was noted. The defect was about  3 cm by 3cm. The defect was repaired with four  interrupted 0 PDS. A 6" x 8" ventralight mesh was inserted and placed beneath the fascia and peritoneum to the repair the hernia. Eight transfascial 0 novafil sutures were used to secure the mesh in place and absorbable tackers were used to appose the mesh against the abdominal wall in all areas.  The abdominal contents were again inspected and hemostasis was satisfactory.  An 0 PDS was used to close the fascial defect of the 71mm trocar site using suture passer. Pneumoperitoneum was removed, all trocar were removed. All incisions were closed with 4-0 monocryl subcuticular stitch. The patient awakened and was brought to PACU in stable condition. All needle, sponge, and instrument counts were correct x2 at the conclusion of the case.  Operative findings: incisional ventral hernia in the supraumbilical area, to the right lateral aspect of the midline incision.  Specimen: none  Blood loss: 10cc  Local anesthesia:  30 ml 0.5% marcaine  Complications: none  Implant: Bard Ventralight ST mesh 6" x 8"  Alfonso Patten, MD MPH Resident, General Surgery [facility]   Feliciana Rossetti, M.D. General, Bariatric, & Minimally Invasive Surgery [facility]

## 2021-02-20 NOTE — Anesthesia Postprocedure Evaluation (Signed)
Anesthesia Post Note  Patient: [name]  Procedure(s) Performed: LAPAROSCOPIC RECURRENT INCISIONAL HERNIA REPAIR WITH MESH (Abdomen)     Patient location during evaluation: PACU Anesthesia Type: General Level of consciousness: awake and alert Pain management: pain level controlled Vital Signs Assessment: post-procedure vital signs reviewed and stable Respiratory status: spontaneous breathing, nonlabored ventilation and respiratory function stable Cardiovascular status: stable and blood pressure returned to baseline Anesthetic complications: no   No notable events documented.  Last Vitals:  Vitals:   02/20/21 1345 02/20/21 1400  BP: (!) 148/82 129/75  Pulse: 64 64  Resp: 14 17  Temp:    SpO2: 98% 94%    Last Pain:  Vitals:   02/20/21 1400  TempSrc:   PainSc: Asleep                 Beryle Lathe

## 2021-02-20 NOTE — Anesthesia Procedure Notes (Signed)
Procedure Name: Intubation Date/Time: 02/20/2021 10:16 AM Performed by: Sherwood Castilla D, CRNA Pre-anesthesia Checklist: Patient identified, Emergency Drugs available, Suction available and Patient being monitored Patient Re-evaluated:Patient Re-evaluated prior to induction Oxygen Delivery Method: Circle system utilized Preoxygenation: Pre-oxygenation with 100% oxygen Induction Type: IV induction Ventilation: Mask ventilation without difficulty Laryngoscope Size: Mac and 4 Grade View: Grade I Tube type: Oral Tube size: 7.0 mm Number of attempts: 1 Airway Equipment and Method: Stylet Placement Confirmation: ETT inserted through vocal cords under direct vision, positive ETCO2 and breath sounds checked- equal and bilateral Secured at: 21 cm Tube secured with: Tape Dental Injury: Teeth and Oropharynx as per pre-operative assessment

## 2021-02-20 NOTE — H&P (Signed)
[name] is an 60 y.o. female.   Chief Complaint: recurrent hernia HPI: 60 yo female underwent open incisional hernia after bleeding complication after sleeve gastrectomy. She has a new hernia in the supraumbilical space. She presents for surgery today.  Past Medical History:  Diagnosis Date   Anemia    Anxiety    Arthritis    back, hands , knees    Chest pain 2010   due to sarcoidosis- per pt.    Chronic pain    Depression    GERD (gastroesophageal reflux disease)    Hypercholesterolemia    Hypertension    Pneumonia    sarcoidosis- in remission    Pulmonary sarcoidosis ([place])    Treatment with Prednisone    Past Surgical History:  Procedure Laterality Date   ABDOMINAL HYSTERECTOMY     APPENDECTOMY     CHOLECYSTECTOMY     EXCISION MASS ABDOMINAL N/A 03/20/2020   Procedure: Excision of anterior abdominal wall fat necrosis;  Surgeon: Cindra Presume, MD;  Location: [place];  Service: Plastics;  Laterality: N/A;   HERNIA REPAIR     INCISIONAL HERNIA REPAIR N/A 07/01/2019   Procedure: OPEN INCISIONAL HERNIA REPAIR WITH MESH;  Surgeon: Shahid Flori, Arta Bruce, MD;  Location: [place];  Service: General;  Laterality: N/A;   INSERTION OF MESH N/A 07/01/2019   Procedure: Insertion Of Mesh;  Surgeon: Maryan Sivak, Arta Bruce, MD;  Location: [place];  Service: General;  Laterality: N/A;   LAPAROSCOPIC GASTRIC SLEEVE RESECTION N/A 02/15/2019   Procedure: LAPAROSCOPIC GASTRIC SLEEVE RESECTION, UPPER ENDO, ERAS Pathway, Hiatal Hernia Repair;  Surgeon: Emmajane Altamura, Arta Bruce, MD;  Location: [facility];  Service: General;  Laterality: N/A;   LAPAROSCOPY N/A 02/17/2019   Procedure: LAPAROSCOPY DIAGNOSTIC CONVERTED TO OPEN, EVACUATION OF HEMATOMA AND LIGATION OF VESSEL;  Surgeon: Italo Banton, Arta Bruce, MD;  Location: [facility];  Service: General;  Laterality: N/A;   LAPAROSCOPY N/A 07/01/2019   Procedure: Laparoscopy Diagnostic;  Surgeon: Kieth Brightly Arta Bruce, MD;  Location: [place];  Service:  General;  Laterality: N/A;   SMALL INTESTINE SURGERY      Family History  Problem Relation Age of Onset   Hypertension Mother    Hyperlipidemia Mother    Lupus Father    Colon cancer Neg Hx    Social History:  reports that she quit smoking about 8 years ago. Her smoking use included cigarettes. She has a 6.00 pack-year smoking history. She has never used smokeless tobacco. She reports current alcohol use. She reports that she does not use drugs.  Allergies:  Allergies  Allergen Reactions   Aspirin Hives    Medications Prior to Admission  Medication Sig Dispense Refill   amLODipine (NORVASC) 5 MG tablet Take 5 mg by mouth daily.     gabapentin (NEURONTIN) 300 MG capsule Take 300 mg by mouth 2 (two) times daily.     HYDROcodone-acetaminophen (NORCO) 7.5-325 MG tablet Take 1 tablet by mouth 3 (three) times daily as needed for moderate pain.     Multiple Vitamins-Minerals (WOMENS 50+ MULTI VITAMIN/MIN PO) Take 1 tablet by mouth daily.     NARCAN 4 MG/0.1ML LIQD nasal spray kit Place 1 spray into the nose once.     nicotine (NICODERM CQ - DOSED IN MG/24 HOURS) 14 mg/24hr patch Place 14 mg onto the skin daily.      No results found for this or any previous visit (from the past 48 hour(s)). No results found.  Review of Systems  Constitutional:  Negative for chills and fever.  HENT:  Negative for hearing loss.   Respiratory:  Negative for cough.   Cardiovascular:  Negative for chest pain and palpitations.  Gastrointestinal:  Negative for abdominal pain, nausea and vomiting.  Genitourinary:  Negative for dysuria and urgency.  Musculoskeletal:  Negative for myalgias and neck pain.  Skin:  Negative for rash.  Neurological:  Negative for dizziness and headaches.  Hematological:  Does not bruise/bleed easily.  Psychiatric/Behavioral:  Negative for suicidal ideas.    Height 5' 2.5" (1.588 m), weight 89.4 kg. Physical Exam Vitals reviewed.  Constitutional:      Appearance: She is  well-developed.  HENT:     Head: Normocephalic and atraumatic.  Eyes:     Conjunctiva/sclera: Conjunctivae normal.     Pupils: Pupils are equal, round, and reactive to light.  Cardiovascular:     Rate and Rhythm: Normal rate and regular rhythm.  Pulmonary:     Effort: Pulmonary effort is normal.     Breath sounds: Normal breath sounds.  Abdominal:     General: Bowel sounds are normal. There is no distension.     Palpations: Abdomen is soft.     Tenderness: There is no abdominal tenderness.     Comments: Supraumbilical hernia  Musculoskeletal:        General: Normal range of motion.     Cervical back: Normal range of motion and neck supple.  Skin:    General: Skin is warm and dry.  Neurological:     Mental Status: She is alert and oriented to person, place, and time.  Psychiatric:        Behavior: Behavior normal.     Assessment/Plan 60 yo female with recurrent incisional hernia -lap incisional hernia repair with mesh -obs stay -ERAS protocol  Mickeal Skinner, MD 02/20/2021, 8:33 AM

## 2021-02-21 ENCOUNTER — Encounter (HOSPITAL_COMMUNITY): Payer: Self-pay | Admitting: General Surgery

## 2021-02-21 DIAGNOSIS — G8929 Other chronic pain: Secondary | ICD-10-CM | POA: Diagnosis present

## 2021-02-21 DIAGNOSIS — Z9049 Acquired absence of other specified parts of digestive tract: Secondary | ICD-10-CM | POA: Diagnosis not present

## 2021-02-21 DIAGNOSIS — M1990 Unspecified osteoarthritis, unspecified site: Secondary | ICD-10-CM | POA: Diagnosis present

## 2021-02-21 DIAGNOSIS — Z79899 Other long term (current) drug therapy: Secondary | ICD-10-CM | POA: Diagnosis not present

## 2021-02-21 DIAGNOSIS — F419 Anxiety disorder, unspecified: Secondary | ICD-10-CM | POA: Diagnosis present

## 2021-02-21 DIAGNOSIS — K660 Peritoneal adhesions (postprocedural) (postinfection): Secondary | ICD-10-CM | POA: Diagnosis present

## 2021-02-21 DIAGNOSIS — F32A Depression, unspecified: Secondary | ICD-10-CM | POA: Diagnosis present

## 2021-02-21 DIAGNOSIS — Z90710 Acquired absence of both cervix and uterus: Secondary | ICD-10-CM | POA: Diagnosis not present

## 2021-02-21 DIAGNOSIS — Z8249 Family history of ischemic heart disease and other diseases of the circulatory system: Secondary | ICD-10-CM | POA: Diagnosis not present

## 2021-02-21 DIAGNOSIS — K219 Gastro-esophageal reflux disease without esophagitis: Secondary | ICD-10-CM | POA: Diagnosis present

## 2021-02-21 DIAGNOSIS — Z87891 Personal history of nicotine dependence: Secondary | ICD-10-CM | POA: Diagnosis not present

## 2021-02-21 DIAGNOSIS — E7800 Pure hypercholesterolemia, unspecified: Secondary | ICD-10-CM | POA: Diagnosis present

## 2021-02-21 DIAGNOSIS — Z886 Allergy status to analgesic agent status: Secondary | ICD-10-CM | POA: Diagnosis not present

## 2021-02-21 DIAGNOSIS — I10 Essential (primary) hypertension: Secondary | ICD-10-CM | POA: Diagnosis present

## 2021-02-21 DIAGNOSIS — K432 Incisional hernia without obstruction or gangrene: Secondary | ICD-10-CM | POA: Diagnosis present

## 2021-02-21 LAB — CBC
HCT: 37.7 % (ref 36.0–46.0)
Hemoglobin: 12.4 g/dL (ref 12.0–15.0)
MCH: 30.2 pg (ref 26.0–34.0)
MCHC: 32.9 g/dL (ref 30.0–36.0)
MCV: 92 fL (ref 80.0–100.0)
Platelets: 248 10*3/uL (ref 150–400)
RBC: 4.1 MIL/uL (ref 3.87–5.11)
RDW: 14.7 % (ref 11.5–15.5)
WBC: 11.1 10*3/uL — ABNORMAL HIGH (ref 4.0–10.5)
nRBC: 0 % (ref 0.0–0.2)

## 2021-02-21 LAB — BASIC METABOLIC PANEL
Anion gap: 6 (ref 5–15)
BUN: 9 mg/dL (ref 6–20)
CO2: 27 mmol/L (ref 22–32)
Calcium: 8.6 mg/dL — ABNORMAL LOW (ref 8.9–10.3)
Chloride: 104 mmol/L (ref 98–111)
Creatinine, Ser: 0.61 mg/dL (ref 0.44–1.00)
GFR, Estimated: >60 mL/min (ref >60)
Glucose, Bld: 122 mg/dL — ABNORMAL HIGH (ref 70–99)
Potassium: 4.1 mmol/L (ref 3.5–5.1)
Sodium: 137 mmol/L (ref 135–145)

## 2021-02-21 MED ORDER — LIP MEDEX EX OINT
TOPICAL_OINTMENT | CUTANEOUS | Status: AC
  Filled 2021-02-21: qty 7

## 2021-02-21 NOTE — Progress Notes (Signed)
  1 Day Post-Op   Chief Complaint/Subjective: Pain issues, ambulating, tolerating diet  Review of Systems See above, otherwise other systems negative   Objective: Vital signs in last 24 hours: Temp:  [97.7 F (36.5 C)-98.4 F (36.9 C)] 97.7 F (36.5 C) (07/13 1325) Pulse Rate:  [58-76] 58 (07/13 1325) Resp:  [17-18] 18 (07/13 0645) BP: (135-162)/(73-88) 149/79 (07/13 1325) SpO2:  [94 %-100 %] 96 % (07/13 1325) FiO2 (%):  [2 %] 2 % (07/13 0155)   Intake/Output from previous day: 07/12 0701 - 07/13 0700 In: 3253.3 [P.O.:640; I.V.:2513.3; IV Piggyback:100] Out: 0  Intake/Output this shift: Total I/O In: 720 [P.O.:720] Out: 0   PE: Gen: NAD Resp: nonlabored Card: RRR Abd: soft, incisions c/d/I, appropriately tender  Lab Results:  Recent Labs    02/20/21 0840 02/21/21 0422  WBC 6.5 11.1*  HGB 14.0 12.4  HCT 41.3 37.7  PLT 257 248   BMET Recent Labs    02/20/21 0840 02/21/21 0519  NA 139 137  K 3.3* 4.1  CL 103 104  CO2 27 27  GLUCOSE 98 122*  BUN 8 9  CREATININE 0.62 0.61  CALCIUM 10.2 8.6*   PT/INR No results for input(s): LABPROT, INR in the last 72 hours. CMP     Component Value Date/Time   NA 137 02/21/2021 0519   NA 139 04/11/2015 1220   K 4.1 02/21/2021 0519   CL 104 02/21/2021 0519   CO2 27 02/21/2021 0519   GLUCOSE 122 (H) 02/21/2021 0519   BUN 9 02/21/2021 0519   BUN 8 04/11/2015 1220   CREATININE 0.61 02/21/2021 0519   CALCIUM 8.6 (L) 02/21/2021 0519   PROT 8.5 (H) 12/11/2020 1728   PROT 8.3 04/11/2015 1220   ALBUMIN 3.7 12/11/2020 1728   ALBUMIN 4.1 04/11/2015 1220   AST 30 12/11/2020 1728   ALT 16 12/11/2020 1728   ALKPHOS 111 12/11/2020 1728   BILITOT 0.8 12/11/2020 1728   BILITOT 0.4 04/11/2015 1220   GFRNONAA >60 02/21/2021 0519   GFRAA >60 07/06/2019 0200   Lipase     Component Value Date/Time   LIPASE 20 12/11/2020 1728    Studies/Results: No results found.  Anti-infectives: Anti-infectives (From admission,  onward)    Start     Dose/Rate Route Frequency Ordered Stop   02/20/21 0830  ceFAZolin (ANCEF) 2 g in sodium chloride 0.9 % 100 mL IVPB        2 g 200 mL/hr over 30 Minutes Intravenous On call 02/20/21 0827 02/20/21 1018       Assessment/Plan  s/p Procedure(s): LAPAROSCOPIC RECURRENT INCISIONAL HERNIA REPAIR WITH MESH 02/20/2021  -pain control issues Continue to mobilize -binder FEN - carb mod diet VTE - lovenox ID - no issues Disposition - pain control   LOS: 0 days   [facility] [place] 02/21/2021, 5:42 PM Please see Amion for pager number during day hours 7:00am-4:30pm or 7:00am -11:30am on weekends

## 2021-02-22 MED ORDER — HYDROCODONE-ACETAMINOPHEN 10-325 MG PO TABS
1.0000 | ORAL_TABLET | Freq: Four times a day (QID) | ORAL | Status: DC | PRN
  Administered 2021-02-22 – 2021-02-25 (×10): 2 via ORAL
  Filled 2021-02-22 (×10): qty 2

## 2021-02-22 MED ORDER — HYDROMORPHONE HCL 1 MG/ML IJ SOLN
1.0000 mg | INTRAMUSCULAR | Status: DC | PRN
  Administered 2021-02-22 – 2021-02-23 (×8): 1 mg via INTRAVENOUS
  Filled 2021-02-22 (×9): qty 1

## 2021-02-22 MED ORDER — POLYETHYLENE GLYCOL 3350 17 G PO PACK
17.0000 g | PACK | Freq: Every day | ORAL | Status: DC
  Administered 2021-02-22 – 2021-02-25 (×4): 17 g via ORAL
  Filled 2021-02-22 (×4): qty 1

## 2021-02-22 NOTE — Progress Notes (Signed)
Mobility Specialist - Progress Note    02/22/21 1611  Mobility  Activity Ambulated in hall  Level of Assistance Contact guard assist, steadying assist  Assistive Device Front wheel walker  Distance Ambulated (ft) 300 ft  Mobility Ambulated with assistance in hallway  Mobility Response Tolerated well  Mobility performed by Mobility specialist  $Mobility charge 1 Mobility    Pt ambulated 300 ft in hallway with RW. Pt did not c/o of SOB or dizziness, but did state pain levels increased during ambulation and when getting into bed. Pt returned to bed after ambulation and requested SCD sleeves to be put on. Left room with pt in bed and call bell at side.   Arliss Journey Mobility Specialist Acute Rehabilitation Services Office: [phone] 02/22/21, 4:18 PM

## 2021-02-22 NOTE — Plan of Care (Signed)

## 2021-02-22 NOTE — Progress Notes (Signed)
  2 Days Post-Op   Chief Complaint/Subjective: Pain moderate, tolerating diet well  Review of Systems See above, otherwise other systems negative    Objective: Vital signs in last 24 hours: Temp:  [98.1 F (36.7 C)-98.9 F (37.2 C)] 98.1 F (36.7 C) (07/14 1312) Pulse Rate:  [57-59] 59 (07/14 1312) Resp:  [16-18] 16 (07/14 1312) BP: (143-147)/(71-78) 143/71 (07/14 1312) SpO2:  [90 %-97 %] 90 % (07/14 1312) Last BM Date: 02/20/21 Intake/Output from previous day: 07/13 0701 - 07/14 0700 In: 2160 [P.O.:2160] Out: 0  Intake/Output this shift: Total I/O In: 240 [P.O.:240] Out: 100 [Urine:100]  PE: Gen: NAD Resp: nonlabored Card: RRR Abd: soft, ATTP, incisions c/d/i  Lab Results:  Recent Labs    02/20/21 0840 02/21/21 0422  WBC 6.5 11.1*  HGB 14.0 12.4  HCT 41.3 37.7  PLT 257 248   BMET Recent Labs    02/20/21 0840 02/21/21 0519  NA 139 137  K 3.3* 4.1  CL 103 104  CO2 27 27  GLUCOSE 98 122*  BUN 8 9  CREATININE 0.62 0.61  CALCIUM 10.2 8.6*   PT/INR No results for input(s): LABPROT, INR in the last 72 hours. CMP     Component Value Date/Time   NA 137 02/21/2021 0519   NA 139 04/11/2015 1220   K 4.1 02/21/2021 0519   CL 104 02/21/2021 0519   CO2 27 02/21/2021 0519   GLUCOSE 122 (H) 02/21/2021 0519   BUN 9 02/21/2021 0519   BUN 8 04/11/2015 1220   CREATININE 0.61 02/21/2021 0519   CALCIUM 8.6 (L) 02/21/2021 0519   PROT 8.5 (H) 12/11/2020 1728   PROT 8.3 04/11/2015 1220   ALBUMIN 3.7 12/11/2020 1728   ALBUMIN 4.1 04/11/2015 1220   AST 30 12/11/2020 1728   ALT 16 12/11/2020 1728   ALKPHOS 111 12/11/2020 1728   BILITOT 0.8 12/11/2020 1728   BILITOT 0.4 04/11/2015 1220   GFRNONAA >60 02/21/2021 0519   GFRAA >60 07/06/2019 0200   Lipase     Component Value Date/Time   LIPASE 20 12/11/2020 1728    Studies/Results: No results found.  Anti-infectives: Anti-infectives (From admission, onward)    Start     Dose/Rate Route Frequency  Ordered Stop   02/20/21 0830  ceFAZolin (ANCEF) 2 g in sodium chloride 0.9 % 100 mL IVPB        2 g 200 mL/hr over 30 Minutes Intravenous On call 02/20/21 0827 02/20/21 1018       Assessment/Plan  s/p Procedure(s): LAPAROSCOPIC RECURRENT INCISIONAL HERNIA REPAIR WITH MESH 02/20/2021  Changing medications around for better pain control Hopefully ambulate in the hall this afternoon  FEN - carb mod diet VTE - lovenox ID - no issues Disposition - inpatient   LOS: 1 day   [facility] 02/22/2021, 1:35 PM Please see Amion for pager number during day hours 7:00am-4:30pm or 7:00am -11:30am on weekends

## 2021-02-23 ENCOUNTER — Inpatient Hospital Stay (HOSPITAL_COMMUNITY): Payer: Medicare Other

## 2021-02-23 MED ORDER — LIP MEDEX EX OINT
TOPICAL_OINTMENT | CUTANEOUS | Status: DC | PRN
  Filled 2021-02-23: qty 7

## 2021-02-23 MED ORDER — BISACODYL 10 MG RE SUPP: 10.0000 mg | Freq: Every day | RECTAL | Status: DC | PRN

## 2021-02-23 MED ORDER — HYDROCODONE-ACETAMINOPHEN 10-325 MG PO TABS: 1.0000 | ORAL_TABLET | Freq: Four times a day (QID) | ORAL | 0 refills | Status: DC | PRN

## 2021-02-23 MED ORDER — IBUPROFEN 800 MG PO TABS: 800.0000 mg | ORAL_TABLET | Freq: Three times a day (TID) | ORAL | 0 refills | Status: DC | PRN

## 2021-02-23 NOTE — Progress Notes (Addendum)
Mobility Specialist - Progress Note   02/23/21 1133  Mobility  Activity Ambulated in hall  Level of Assistance Contact guard assist, steadying assist  Assistive Device None  Distance Ambulated (ft) 160 ft  Mobility Ambulated with assistance in hallway  Mobility Response Tolerated well  Mobility performed by Mobility specialist (11:01-11:11)  $Mobility charge 1 Mobility     Pt ambulated 160 ft in hallway. Pt stated feeling "light headed" and SOB towards end of ambulation. Pt returned to recliner after ambulation with call bell at side.   Arliss Journey Mobility Specialist Acute Rehabilitation Services Office: [phone] 02/23/21, 11:37 AM

## 2021-02-24 NOTE — Progress Notes (Signed)
Assessment & Plan: POD#4 - status post LAPAROSCOPIC RECURRENT INCISIONAL HERNIA REPAIR WITH MESH - 02/20/2021   Carb mod diet  Pain control  Ambulate in halls  Miralax - no BM yet        Darnell Level, MD       [facility]       Office: [phone]   Chief Complaint: Ventral incisional hernia   Subjective: Patient up in room, moving to chair.  Complains of pain.  Solid food for breakfast.  Wants to shower.  Objective: Vital signs in last 24 hours: Temp:  [97.9 F (36.6 C)-99.5 F (37.5 C)] 97.9 F (36.6 C) (07/16 0558) Pulse Rate:  [62-66] 66 (07/16 0558) Resp:  [18] 18 (07/16 0558) BP: (122-130)/(63-85) 130/85 (07/16 0558) SpO2:  [91 %-94 %] 94 % (07/16 0558) Last BM Date: 02/20/21  Intake/Output from previous day: 07/15 0701 - 07/16 0700 In: 1440 [P.O.:1440] Out: 1700 [Urine:1700] Intake/Output this shift: Total I/O In: 240 [P.O.:240] Out: 300 [Urine:300]  Physical Exam: HEENT - sclerae clear, mucous membranes moist Neck - soft Abdomen - mild distension; wounds dry and intact; binder replaced Ext - no edema, non-tender Neuro - alert & oriented, no focal deficits  Lab Results:  No results for input(s): WBC, HGB, HCT, PLT in the last 72 hours. BMET No results for input(s): NA, K, CL, CO2, GLUCOSE, BUN, CREATININE, CALCIUM in the last 72 hours. PT/INR No results for input(s): LABPROT, INR in the last 72 hours. Comprehensive Metabolic Panel:    Component Value Date/Time   NA 137 02/21/2021 0519   NA 139 02/20/2021 0840   NA 139 04/11/2015 1220   K 4.1 02/21/2021 0519   K 3.3 (L) 02/20/2021 0840   CL 104 02/21/2021 0519   CL 103 02/20/2021 0840   CO2 27 02/21/2021 0519   CO2 27 02/20/2021 0840   BUN 9 02/21/2021 0519   BUN 8 02/20/2021 0840   BUN 8 04/11/2015 1220   CREATININE 0.61 02/21/2021 0519   CREATININE 0.62 02/20/2021 0840   GLUCOSE 122 (H) 02/21/2021 0519   GLUCOSE 98 02/20/2021 0840   CALCIUM 8.6 (L) 02/21/2021  0519   CALCIUM 10.2 02/20/2021 0840   AST 30 12/11/2020 1728   AST 11 (L) 02/25/2019 0341   ALT 16 12/11/2020 1728   ALT 29 02/25/2019 0341   ALKPHOS 111 12/11/2020 1728   ALKPHOS 60 02/25/2019 0341   BILITOT 0.8 12/11/2020 1728   BILITOT 0.8 02/25/2019 0341   BILITOT 0.4 04/11/2015 1220   PROT 8.5 (H) 12/11/2020 1728   PROT 6.2 (L) 02/25/2019 0341   PROT 8.3 04/11/2015 1220   ALBUMIN 3.7 12/11/2020 1728   ALBUMIN 1.9 (L) 02/25/2019 0341   ALBUMIN 4.1 04/11/2015 1220    Studies/Results: DG Abd Portable 1V  Result Date: 02/23/2021 CLINICAL DATA:  Central abdominal pain, nausea, recent laparoscopic hernia repair 02/20/2021 EXAM: PORTABLE ABDOMEN - 1 VIEW COMPARISON:  12/11/2020 FINDINGS: 2 supine frontal views of the abdomen and pelvis demonstrate an unremarkable bowel gas pattern. Moderate stool throughout the colon. No obstruction or ileus. There are no masses or abnormal calcifications. Postsurgical changes from previous bariatric surgery and cholecystectomy. Lung bases are clear. IMPRESSION: 1. Unremarkable bowel gas pattern. 2. Moderate retained stool throughout the colon. Electronically Signed   By: Sharlet Salina M.D.   On: 02/23/2021 17:11      Darnell Level 02/24/2021   Patient ID: [name], female   DOB: 11-Jun-1961, 60 y.o.  MRN: [id number]

## 2021-02-24 NOTE — Plan of Care (Signed)
Patient ambulated 800 ft, tolerated well. Patient requested prune juice to help with Bowl movement.  Problem: Education: Goal: Knowledge of General Education information will improve Description: Including pain rating scale, medication(s)/side effects and non-pharmacologic comfort measures Outcome: Progressing   Problem: Health Behavior/Discharge Planning: Goal: Ability to manage health-related needs will improve Outcome: Progressing   Problem: Clinical Measurements: Goal: Ability to maintain clinical measurements within normal limits will improve Outcome: Progressing Goal: Will remain free from infection Outcome: Progressing Goal: Diagnostic test results will improve Outcome: Progressing Goal: Respiratory complications will improve Outcome: Progressing Goal: Cardiovascular complication will be avoided Outcome: Progressing   Problem: Activity: Goal: Risk for activity intolerance will decrease Outcome: Progressing   Problem: Nutrition: Goal: Adequate nutrition will be maintained Outcome: Progressing   Problem: Coping: Goal: Level of anxiety will decrease Outcome: Progressing   Problem: Elimination: Goal: Will not experience complications related to bowel motility Outcome: Progressing Goal: Will not experience complications related to urinary retention Outcome: Progressing   Problem: Pain Managment: Goal: General experience of comfort will improve Outcome: Progressing   Problem: Safety: Goal: Ability to remain free from injury will improve Outcome: Progressing   Problem: Skin Integrity: Goal: Risk for impaired skin integrity will decrease Outcome: Progressing

## 2021-02-25 NOTE — Discharge Instructions (Signed)
[facility]  LAPAROSCOPIC SURGERY:  POST-OP INSTRUCTIONS  Always review your discharge instruction sheet given to you by the facility where your surgery was performed.  A prescription for pain medication may be given to you upon discharge.  Take your pain medication as prescribed.  If narcotic pain medicine is not needed, then you may take acetaminophen (Tylenol) or ibuprofen (Advil) as needed.  Take your usually prescribed medications unless otherwise directed.  If you need a refill on your pain medication, please contact your pharmacy.  They will contact our office to request authorization. Prescriptions will not be filled after 5 P.M. or on weekends.  You should follow a light diet the first few days after arrival home, such as soup and crackers or toast.  Be sure to include plenty of fluids daily.  Most patients will experience some swelling and bruising in the area of the incisions.  Ice packs will help.  Swelling and bruising can take several days to resolve.   It is common to experience some constipation after surgery.  Increasing fluid intake and taking a stool softener (such as Colace) will usually help or prevent this problem from occurring.  A mild laxative (Milk of Magnesia or Miralax) should be taken according to package instructions if there has been no bowel movement after 48 hours.  You will likely have Dermabond (topical glue) over your incisions.  This seals the incisions and allows you to bathe and shower at any time after your surgery.  Glue should remain in place for up to 10 days.  It may be removed after 10 days by pealing off the Dermabond material or using Vaseline or naval jelly to remove.  If you have steri-strips over your incisions, you may remove the gauze bandage on the second day after surgery, and you may shower at that time.  Leave your steri-strips (small skin tapes) in place directly over the incision.  These strips should remain on the  skin for 5-7 days and then be removed.  You may get them wet in the shower and pat them dry.  Any sutures or staples will be removed at the office during your follow-up visit.  ACTIVITIES:  You may resume regular (light) daily activities beginning the next day - such as daily self-care, walking, climbing stairs - gradually increasing activities as tolerated.  You may have sexual intercourse when it is comfortable.  Refrain from any heavy lifting or straining until approved by your doctor.  You may drive when you are no longer taking prescription pain medication, when you can comfortably wear a seatbelt, and when you can safely maneuver your car and apply brakes.  You should see your doctor in the office for a follow-up appointment approximately 2-3 weeks after your surgery.  Make sure that you call for this appointment within a day or two after you arrive home to insure a convenient appointment time.  WHEN TO CALL YOUR DOCTOR: Fever over 101.0 Inability to urinate Continued bleeding from incision Increased pain, redness, or drainage from the incision Increasing abdominal pain  The clinic staff is available to answer your questions during regular business hours.  Please don't hesitate to call and ask to speak to one of the nurses for clinical concerns.  If you have a medical emergency, go to the nearest emergency room or call 911.  A surgeon from [facility] is always on call for the hospital.  Ramello Cordial, MD [facility] Office: [phone] Toll Free:    [phone] FAX [phone]  Website: [email or web address] 

## 2021-02-25 NOTE — Progress Notes (Signed)
Nurse reviewed discharge instructions with pt. Pt verbalized understanding of discharge instructions, follow up appointment and new medications.  No concerns at time of discharge. 

## 2021-02-25 NOTE — Discharge Summary (Signed)
Physician Discharge Summary [facility]  Patient ID: Lucky Alverson MRN: [id number] DOB/AGE: October 05, 1960 60 y.o.  Admit date: 02/20/2021  Discharge date: 02/25/2021  Discharge Diagnoses:  Active Problems:   Incisional hernia   Discharged Condition: good  Hospital Course: Patient was admitted for observation following laparoscopic recurrent ventral incisional hernia surgery.  Post op course was uncomplicated.  Pain was well controlled.  Tolerated diet.  Patient was prepared for discharge home on POD#5.   Consults: None  Treatments: surgery: laparoscopic repair recurrent ventral incisional hernia with mesh  Discharge Exam: Blood pressure (!) 142/97, pulse 61, temperature 98.2 F (36.8 C), temperature source Oral, resp. rate 18, height $RemoveBe'5\' 2"'cvRFkSbaF$  (1.575 m), weight 89.4 kg, SpO2 97 %. HEENT - clear Neck - soft Chest - clear bilaterally Cor - RRR Abd - wounds dry and intact; BS present; binder replaced  Disposition: Home  Discharge Instructions     Diet - low sodium heart healthy   Complete by: As directed    Increase activity slowly   Complete by: As directed    No dressing needed   Complete by: As directed       Allergies as of 02/25/2021       Reactions   Aspirin Hives        Medication List     STOP taking these medications    HYDROcodone-acetaminophen 7.5-325 MG tablet Commonly known as: NORCO Replaced by: HYDROcodone-acetaminophen 10-325 MG tablet       TAKE these medications    amLODipine 5 MG tablet Commonly known as: NORVASC Take 5 mg by mouth daily.   gabapentin 300 MG capsule Commonly known as: NEURONTIN Take 300 mg by mouth 2 (two) times daily.   HYDROcodone-acetaminophen 10-325 MG tablet Commonly known as: NORCO Take 1-2 tablets by mouth every 6 (six) hours as needed for up to 30 doses. Replaces: HYDROcodone-acetaminophen 7.5-325 MG tablet   ibuprofen 800 MG tablet Commonly known as: ADVIL Take 1 tablet (800 mg  total) by mouth every 8 (eight) hours as needed.   Narcan 4 MG/0.1ML Liqd nasal spray kit Generic drug: naloxone Place 1 spray into the nose once.   nicotine 14 mg/24hr patch Commonly known as: NICODERM CQ - dosed in mg/24 hours Place 14 mg onto the skin daily.   WOMENS 50+ MULTI VITAMIN/MIN PO Take 1 tablet by mouth daily.               Discharge Care Instructions  (From admission, onward)           Start     Ordered   02/25/21 0000  No dressing needed        02/25/21 8921            Follow-up Information     Kinsinger, Arta Bruce, MD. Schedule an appointment as soon as possible for a visit in 2 week(s).   Specialty: General Surgery Why: For wound re-check Contact information: [place] [address] [phone]                 Ellarose Brandi, [facility] Office: [phone]   Signed: [name] 02/25/2021, 9:23 AM

## 2021-03-05 ENCOUNTER — Ambulatory Visit (HOSPITAL_COMMUNITY)
Admission: RE | Admit: 2021-03-05 | Discharge: 2021-03-05 | Disposition: A | Discharge status: Home / Self Care | Payer: Medicare Other | Source: Ambulatory Visit | Attending: Internal Medicine | Admitting: Internal Medicine

## 2021-03-05 ENCOUNTER — Other Ambulatory Visit: Payer: Self-pay

## 2021-03-05 DIAGNOSIS — R911 Solitary pulmonary nodule: Secondary | ICD-10-CM | POA: Diagnosis present

## 2021-03-07 ENCOUNTER — Other Ambulatory Visit: Payer: Self-pay | Admitting: Internal Medicine

## 2021-03-07 DIAGNOSIS — D869 Sarcoidosis, unspecified: Secondary | ICD-10-CM

## 2021-03-07 NOTE — Progress Notes (Signed)
Spoke with pt and notified of results per Dr. Sherene Sires. Pt verbalized understanding and denied any questions. Appt with MW set up for 05/01/21 and ECHO ordered to be done before.

## 2021-04-16 ENCOUNTER — Other Ambulatory Visit (HOSPITAL_COMMUNITY): Payer: Medicare Other

## 2021-04-18 ENCOUNTER — Other Ambulatory Visit: Payer: Self-pay

## 2021-04-18 ENCOUNTER — Ambulatory Visit (HOSPITAL_COMMUNITY)
Admission: RE | Admit: 2021-04-18 | Discharge: 2021-04-18 | Disposition: A | Discharge status: Home / Self Care | Payer: Medicare Other | Source: Ambulatory Visit | Attending: Internal Medicine | Admitting: Internal Medicine

## 2021-04-18 DIAGNOSIS — R0609 Other forms of dyspnea: Secondary | ICD-10-CM | POA: Diagnosis not present

## 2021-04-18 DIAGNOSIS — D869 Sarcoidosis, unspecified: Secondary | ICD-10-CM | POA: Insufficient documentation

## 2021-04-18 LAB — ECHOCARDIOGRAM COMPLETE
AR max vel: 2.41 cm2
AV Area VTI: 2.41 cm2
AV Area mean vel: 2.3 cm2
AV Mean grad: 5 mmHg
AV Peak grad: 9.1 mmHg
Ao pk vel: 1.51 m/s
Area-P 1/2: 2.23 cm2
Calc EF: 71.5 %
MV VTI: 824.59 cm2
S' Lateral: 3.22 cm
Single Plane A2C EF: 74.2 %
Single Plane A4C EF: 68.9 %

## 2021-04-20 ENCOUNTER — Encounter: Payer: Self-pay | Admitting: *Deleted

## 2021-05-01 ENCOUNTER — Encounter: Payer: Self-pay | Admitting: Internal Medicine

## 2021-05-01 ENCOUNTER — Ambulatory Visit (INDEPENDENT_AMBULATORY_CARE_PROVIDER_SITE_OTHER): Payer: Medicare Other | Admitting: Internal Medicine

## 2021-05-01 ENCOUNTER — Other Ambulatory Visit: Payer: Self-pay

## 2021-05-01 VITALS — BP 142/88 | HR 74 | Temp 98.0°F | Ht 62.5 in | Wt 202.1 lb

## 2021-05-01 DIAGNOSIS — D869 Sarcoidosis, unspecified: Secondary | ICD-10-CM

## 2021-05-01 DIAGNOSIS — Z23 Encounter for immunization: Secondary | ICD-10-CM

## 2021-05-01 NOTE — Progress Notes (Signed)
[name], female    DOB: 10/02/1960    MRN: [id number]   Brief patient profile:  39 yobf quit smoking 2014 developed sob in [place] around 1990 dx with FOB / ? tbbx with sarcoid > worse around 2014 > [place] >  50% better on up to 60 mg Prednisone weaned off over several years and "gained about 100 lb" and had gastric bypass July 2020 at [facility] complicated by post hosp resp fiailure and lost about 30 lb and referred back to pulmonary clinic 12/16/2019 by Dr  Dorette Grate.  Peak wt 390      History of Present Illness  12/16/2019  Pulmonary/ 1st office eval/Micahel Omlor f/u sarcoid  - s/p second shot covid 19 moderna Chief Complaint  Patient presents with   Consult   Dyspnea:  Housework causes her to feek tired > sob / has treadmill did 30 min 3-26mph flat s stopping one day prior to OV   Cough: none  Sleep: on side/ one pillow bed flat  SABA use: none Arthritis knees / back >> hands and not ankles Rec Weight control is simply a matter of calorie balance  If there is a concern about your breathing> Make sure you check your oxygen saturations at highest level of activity to be sure it stays over 90%  (by pulse oximeter)        05/01/2021  f/u ov/[place] office/[name] re:  Chief Complaint  Patient presents with   Follow-up    Patient states that she hasn't had any SOB or cough since last visit. No issues to report.   Dyspnea:  not working out but doing steps / yardwork Cough: none  Sleeping: no problem flat, 2 pillows  SABA use: none  02: none  Covid status:  vax x 3/ never infected Opth eval yearly just done      No obvious day to day or daytime variability or assoc excess/ purulent sputum or mucus plugs or hemoptysis or cp or chest tightness, subjective wheeze or overt sinus or hb symptoms.   Sleeping  without nocturnal  or early am exacerbation  of respiratory  c/o's or need for noct saba. Also denies any obvious fluctuation of symptoms with weather or environmental changes or other  aggravating or alleviating factors except as outlined above   No unusual exposure hx or h/o childhood pna/ asthma or knowledge of premature birth.  Current Allergies, Complete Past Medical History, Past Surgical History, Family History, and Social History were reviewed in [employer] record.  ROS  The following are not active complaints unless bolded Hoarseness, sore throat, dysphagia, dental problems, itching, sneezing,  nasal congestion or discharge of excess mucus or purulent secretions, ear ache,   fever, chills, sweats, unintended wt loss or wt gain, classically pleuritic or exertional cp,  orthopnea pnd or arm/hand swelling  or leg swelling, presyncope, palpitations, abdominal pain, anorexia, nausea, vomiting, diarrhea  or change in bowel habits or change in bladder habits, change in stools or change in urine, dysuria, hematuria,  rash, arthralgias, visual complaints, headache, numbness, weakness or ataxia or problems with walking or coordination,  change in mood or  memory.        Current Meds  Medication Sig   amLODipine (NORVASC) 5 MG tablet Take 5 mg by mouth daily.   gabapentin (NEURONTIN) 300 MG capsule Take 300 mg by mouth 2 (two) times daily.   HYDROcodone-acetaminophen (NORCO) 10-325 MG tablet Take 1-2 tablets by mouth every 6 (six) hours as  needed for up to 30 doses.   ibuprofen (ADVIL) 800 MG tablet Take 1 tablet (800 mg total) by mouth every 8 (eight) hours as needed.   Multiple Vitamins-Minerals (WOMENS 50+ MULTI VITAMIN/MIN PO) Take 1 tablet by mouth daily.   NARCAN 4 MG/0.1ML LIQD nasal spray kit Place 1 spray into the nose once.   nicotine (NICODERM CQ - DOSED IN MG/24 HOURS) 14 mg/24hr patch Place 14 mg onto the skin daily.              Past Medical History:  Diagnosis Date   Anxiety    Arthritis    back, hands , knees    Chest pain 2010   due to sarcoidosis- per pt.    Depression    GERD (gastroesophageal reflux disease)     Hypercholesterolemia    Hypertension    Pneumonia    sarcoidosis- in remission    Postoperative nausea and vomiting 02/24/2019   Pulmonary sarcoidosis ([place])    Treatment with Prednisone      Objective:       Wt Readings from Last 3 Encounters:  05/01/21 202 lb 1.9 oz (91.7 kg)  02/20/21 197 lb (89.4 kg)  03/20/20 207 lb 0.2 oz (93.9 kg)      Vital signs reviewed  05/01/2021  - Note at rest 02 sats  99% on RA   General appearance:    amb mod obese bf nad   HEENT : pt wearing mask not removed for exam due to covid -19 concerns.    NECK :  without JVD/Nodes/TM/ nl carotid upstrokes bilaterally   LUNGS: no acc muscle use,  Nl contour chest which is clear to A and P bilaterally without cough on insp or exp maneuvers   CV:  RRR  no s3 or murmur or increase in P2, and no edema   ABD:  soft and nontender with nl inspiratory excursion in the supine position. No bruits or organomegaly appreciated, bowel sounds nl  MS:  Nl gait/ ext warm without deformities, calf tenderness, cyanosis or clubbing No obvious joint restrictions   SKIN: warm and dry without lesions    NEURO:  alert, approp, nl sensorium with  no motor or cerebellar deficits apparent.        I personally reviewed images and agree with radiology impression as follows:   Chest CT s contrast 03/05/21 1. Chronic imaging stigmata of sarcoidosis in the lungs, similar to prior studies, as above. 2. No lymphadenopathy noted on today's examination. 3. Dilatation of the pulmonic trunk (3.6 cm in diameter), concerning for associated pulmonary arterial hypertension.  Echo 04/18/21 neg for [facility]       Assessment

## 2021-05-01 NOTE — Assessment & Plan Note (Addendum)
Developed sob in [place] around 1990 dx with FOB / ? tbbx with sarcoid > worse around 2014 > [place] >  50% better on up to 60 mg Prednisone weaned off over several years  - no convincing evidence of active sarcoid on CTa 02/23/2019 done post op but clearly not normal and also additional 7 mm nodule detected in this former smoker so reasonable to rec repeat CT s contrast at the one year mark = 02/23/20 and fu prn  - CT chest 02/21/2020 1. Interval resolution of the bilateral patchy ground-glass attenuation seen previous  ly. 2. Areas of architectural distortion and parenchymal scarring in the lungs bilaterally, similar to prior. 3. Left apical nodule described on the prior study is stable. CT at 12 months (from today's scan) is considered optional for low-risk Patients >   rec final f/u 02/2021 as quit smoking 2014  - CT s contrast 03/05/21  1. Chronic imaging stigmata of sarcoidosis in the lungs, similar to prior studies, as above. 2. No lymphadenopathy noted on today's examination. 3. Dilatation of the pulmonic trunk (3.6 cm in diameter), concerning for associated pulmonary arterial hypertension  - Echo 04/18/21 wnl   No evidence of active sarcoidosis   rec Wt loss with regular aerobics see avs for instructions unique to this ov  Keep up with opth   Pulmonary f/u is prn           Each maintenance medication was reviewed in detail including emphasizing most importantly the difference between maintenance and prns and under what circumstances the prns are to be triggered using an action plan format where appropriate.  Total time for H and P, chart review, counseling,   and generating customized AVS unique to this office visit / same day charting = 25 min

## 2021-05-01 NOTE — Patient Instructions (Addendum)
Keep up your eye appointments   To get the most out of exercise, you need to be continuously aware that you are short of breath, but never out of breath, for at least 30 minutes daily. As you improve, it will actually be easier for you to do the same amount of exercise  in  30 minutes so always push to the level where you are short of breath.       Pulmonary follow up is as needed

## 2021-07-04 ENCOUNTER — Ambulatory Visit: Payer: Medicare Other | Admitting: Nurse Practitioner

## 2021-07-10 ENCOUNTER — Ambulatory Visit: Payer: Medicare Other | Admitting: Nurse Practitioner

## 2021-07-12 ENCOUNTER — Ambulatory Visit (INDEPENDENT_AMBULATORY_CARE_PROVIDER_SITE_OTHER): Payer: Medicare Other | Admitting: Nurse Practitioner

## 2021-07-12 ENCOUNTER — Other Ambulatory Visit: Payer: Self-pay

## 2021-07-12 ENCOUNTER — Encounter: Payer: Self-pay | Admitting: Nurse Practitioner

## 2021-07-12 VITALS — BP 156/80 | HR 75 | Ht 62.5 in | Wt 203.0 lb

## 2021-07-12 DIAGNOSIS — Z6841 Body Mass Index (BMI) 40.0 and over, adult: Secondary | ICD-10-CM

## 2021-07-12 DIAGNOSIS — E559 Vitamin D deficiency, unspecified: Secondary | ICD-10-CM | POA: Insufficient documentation

## 2021-07-12 DIAGNOSIS — I700 Atherosclerosis of aorta: Secondary | ICD-10-CM | POA: Insufficient documentation

## 2021-07-12 DIAGNOSIS — M5442 Lumbago with sciatica, left side: Secondary | ICD-10-CM

## 2021-07-12 DIAGNOSIS — E7849 Other hyperlipidemia: Secondary | ICD-10-CM

## 2021-07-12 DIAGNOSIS — I10 Essential (primary) hypertension: Secondary | ICD-10-CM | POA: Insufficient documentation

## 2021-07-12 DIAGNOSIS — D869 Sarcoidosis, unspecified: Secondary | ICD-10-CM | POA: Diagnosis not present

## 2021-07-12 MED ORDER — AMLODIPINE BESYLATE 10 MG PO TABS
10.0000 mg | ORAL_TABLET | Freq: Every day | ORAL | 3 refills | Status: DC
Start: 2021-07-12 — End: 2022-11-18

## 2021-07-12 MED ORDER — BLOOD PRESSURE KIT KIT: PACK | 0 refills | Status: AC

## 2021-07-12 NOTE — Assessment & Plan Note (Addendum)
Found on CT dated 03/05/2021. Not on Asprin Not smoking currently Has no diabetes Will check lipid panel next visit and start on medications indicated.   Will check lipid panel next visit. Eat a healthy diet, including lots of fruits and vegetables. Avoid foods with a lot of saturated and trans fats, such as red meat, butter, fried foods and cheese . Maintain a healthy weight.

## 2021-07-12 NOTE — Patient Instructions (Signed)
Start taking amlodipine 10 mg daily for your blood pressure. Check your blood pressure three times a week write it down and bring with you to your next visit.    It is important that you exercise regularly at least 30 minutes 5 times a week.  Think about what you will eat, plan ahead. Choose " clean, green, fresh or frozen" over canned, processed or packaged foods which are more sugary, salty and fatty. 70 to 75% of food eaten should be vegetables and fruit. Three meals at set times with snacks allowed between meals, but they must be fruit or vegetables. Aim to eat over a 12 hour period , example 7 am to 7 pm, and STOP after  your last meal of the day. Drink water,generally about 64 ounces per day, no other drink is as healthy. Fruit juice is best enjoyed in a healthy way, by EATING the fruit.  Thanks for choosing [facility], we consider it a privelige to serve you.

## 2021-07-12 NOTE — Assessment & Plan Note (Addendum)
previously going to pain management. Takes norco, ibuprofen and gabapentin

## 2021-07-12 NOTE — Assessment & Plan Note (Signed)
BP Readings from Last 3 Encounters:  07/12/21 (!) 156/80  05/01/21 (!) 142/88  02/25/21 (!) 142/97  start taking amlodipine 10mg  daily.  Avoid salty food, engage in regular vigorous excercise.

## 2021-07-12 NOTE — Assessment & Plan Note (Addendum)
  No evidence of active sarcoidosis 05/01/21 F/U with  pulmonology as needed.

## 2021-07-12 NOTE — Assessment & Plan Note (Addendum)
Portion control, heart healthy low fat diet, and regular excercise 30 minutes 5 times a week discussed.  Had gastric bypass surgery in 2020. Wt Readings from Last 3 Encounters:  07/12/21 203 lb (92.1 kg)  05/01/21 202 lb 1.9 oz (91.7 kg)  02/20/21 197 lb (89.4 kg)

## 2021-07-12 NOTE — Assessment & Plan Note (Signed)
Will check labs at next visit.   

## 2021-07-12 NOTE — Assessment & Plan Note (Signed)
Takes vitamin D 50,000 units weekly. Check labs at next visit

## 2021-07-12 NOTE — Progress Notes (Signed)
   [name]     MRN: [id number]      DOB: 20-Nov-1960   HPI [name] is here to establish care. Previous PCP D. Wynelle Link. PT stated that her last annual exam was 3 months ago at previous PCP's office. .We will obtain her medical record from her previous PCP.   Pt was going to pulmonologist for sarcoidosis.She has now been cleared and recommended to see them as needed.  Pt  stated that she was previously going to pain management for back pain.  Pt has not had her shingles, flu, TDAP and pneumonia vaccine, she refused them today. The need for her to get the recommended vaccines discussed with pt. She verbalized understanding. Pt stated that she had a cologuard done 4 years ago and it was negative She had a total hysterectomy.  Hernia repair was done this year, no complications.    ROS Denies recent fever or chills. Denies sinus pressure, nasal congestion, ear pain or sore throat. Denies chest congestion, productive cough or wheezing. Denies chest pains, palpitations and leg swelling Denies abdominal pain, nausea, vomiting,diarrhea or constipation.   Denies dysuria, frequency, hesitancy or incontinence. Denies joint pain, swelling and limitation in mobility. Denies headaches, seizures, numbness, or tingling. Denies depression, anxiety or insomnia. Denies skin break down or rash.   PE  BP (!) 156/80   Pulse 75   Ht 5' 2.5" (1.588 m)   Wt 203 lb (92.1 kg)   SpO2 95%   BMI 36.54 kg/m   Patient alert and oriented and in no cardiopulmonary distress.  HEENT: No facial asymmetry, EOMI,     Neck supple .  Chest: Clear to auscultation bilaterally.  CVS: S1, S2 no murmurs, no S3.Regular rate.  ABD: Soft non tender., scars from hernia repair.   Ext: No edema  MS: Adequate ROM spine, shoulders, hips and knees.  Skin: Intact, no ulcerations or rash noted.  Psych: Good eye contact, normal affect. Memory intact not anxious or depressed appearing.  CNS: CN 2-12 intact, power,   normal throughout.no focal deficits noted.   Assessment & Plan

## 2021-08-14 ENCOUNTER — Ambulatory Visit (INDEPENDENT_AMBULATORY_CARE_PROVIDER_SITE_OTHER): Payer: Medicare HMO

## 2021-08-14 ENCOUNTER — Other Ambulatory Visit: Payer: Self-pay

## 2021-08-14 DIAGNOSIS — Z59819 Housing instability, housed unspecified: Secondary | ICD-10-CM

## 2021-08-14 DIAGNOSIS — Z780 Asymptomatic menopausal state: Secondary | ICD-10-CM | POA: Diagnosis not present

## 2021-08-14 DIAGNOSIS — Z0000 Encounter for general adult medical examination without abnormal findings: Secondary | ICD-10-CM | POA: Diagnosis not present

## 2021-08-14 DIAGNOSIS — Z1211 Encounter for screening for malignant neoplasm of colon: Secondary | ICD-10-CM

## 2021-08-14 NOTE — Progress Notes (Signed)
Subjective:   [name] is a 61 y.o. female who presents for an Initial Medicare Annual Wellness Visit. I connected with  [name] on 08/14/21 by a audio enabled telemedicine application and verified that I am speaking with the correct person using two identifiers.  Patient Location: Home  Provider Location: Office/Clinic  I discussed the limitations of evaluation and management by telemedicine. The patient expressed understanding and agreed to proceed.  Review of Systems    Defer to PCP Cardiac Risk Factors include: hypertension     Objective:    Today's Vitals   08/14/21 0805  PainSc: 0-No pain   There is no height or weight on file to calculate BMI.  Advanced Directives 08/14/2021 02/22/2021 02/13/2021 12/11/2020 03/20/2020 03/13/2020 07/02/2019  Does Patient Have a Medical Advance Directive? No No No No No No No  Would patient like information on creating a medical advance directive? Yes (ED - Information included in AVS) No - Patient declined - No - Patient declined No - Patient declined No - Patient declined No - Patient declined    Current Medications (verified) Outpatient Encounter Medications as of 08/14/2021  Medication Sig   amLODipine (NORVASC) 10 MG tablet Take 1 tablet (10 mg total) by mouth daily.   Blood Pressure Monitoring (BLOOD PRESSURE KIT) KIT Check your blood pressure 3 x a week   gabapentin (NEURONTIN) 300 MG capsule Take 300 mg by mouth 2 (two) times daily.   HYDROcodone-acetaminophen (NORCO) 10-325 MG tablet Take 1-2 tablets by mouth every 6 (six) hours as needed for up to 30 doses.   ibuprofen (ADVIL) 800 MG tablet Take 1 tablet (800 mg total) by mouth every 8 (eight) hours as needed.   Multiple Vitamins-Minerals (WOMENS 50+ MULTI VITAMIN/MIN PO) Take 1 tablet by mouth daily.   NARCAN 4 MG/0.1ML LIQD nasal spray kit Place 1 spray into the nose once.   Vitamin D, Ergocalciferol, (DRISDOL) 1.25 MG (50000 UNIT) CAPS capsule Take 50,000 Units by mouth  once a week.   No facility-administered encounter medications on file as of 08/14/2021.    Allergies (verified) Aspirin   History: Past Medical History:  Diagnosis Date   Anemia    Anxiety    Arthritis    back, hands , knees    Chest pain 2010   due to sarcoidosis- per pt.    Chronic pain    Depression    GERD (gastroesophageal reflux disease)    Hypercholesterolemia    Hypertension    Pneumonia    sarcoidosis- in remission    Pulmonary sarcoidosis (HCC)    Treatment with Prednisone   Past Surgical History:  Procedure Laterality Date   ABDOMINAL HYSTERECTOMY     APPENDECTOMY     CHOLECYSTECTOMY     EXCISION MASS ABDOMINAL N/A 03/20/2020   Procedure: Excision of anterior abdominal wall fat necrosis;  Surgeon: Allena Napoleon, MD;  Location: [facility];  Service: Plastics;  Laterality: N/A;   HERNIA REPAIR     INCISIONAL HERNIA REPAIR N/A 07/01/2019   Procedure: OPEN INCISIONAL HERNIA REPAIR WITH MESH;  Surgeon: Kinsinger, De Blanch, MD;  Location: [facility] OR;  Service: General;  Laterality: N/A;   INCISIONAL HERNIA REPAIR N/A 02/20/2021   Procedure: LAPAROSCOPIC RECURRENT INCISIONAL HERNIA REPAIR WITH MESH;  Surgeon: Sheliah Hatch De Blanch, MD;  Location: [facility];  Service: General;  Laterality: N/A;   INSERTION OF MESH N/A 07/01/2019   Procedure: Insertion Of Mesh;  Surgeon: Kinsinger, De Blanch, MD;  Location: [facility]  OR;  Service: General;  Laterality: N/A;   LAPAROSCOPIC GASTRIC SLEEVE RESECTION N/A 02/15/2019   Procedure: LAPAROSCOPIC GASTRIC SLEEVE RESECTION, UPPER ENDO, ERAS Pathway, Hiatal Hernia Repair;  Surgeon: Kinsinger, Arta Bruce, MD;  Location: [facility];  Service: General;  Laterality: N/A;   LAPAROSCOPY N/A 02/17/2019   Procedure: LAPAROSCOPY DIAGNOSTIC CONVERTED TO OPEN, EVACUATION OF HEMATOMA AND LIGATION OF VESSEL;  Surgeon: Kinsinger, Arta Bruce, MD;  Location: [facility];  Service: General;  Laterality: N/A;   LAPAROSCOPY N/A 07/01/2019   Procedure: Laparoscopy  Diagnostic;  Surgeon: Kieth Brightly, Arta Bruce, MD;  Location: [place];  Service: General;  Laterality: N/A;   SMALL INTESTINE SURGERY     Family History  Problem Relation Age of Onset   Hypertension Mother    Hyperlipidemia Mother    Lupus Father    Brain cancer Sister        2020   Lung cancer Neg Hx    Social History   Socioeconomic History   Marital status: Divorced    Spouse name: Not on file   Number of children: 2   Years of education: 12th   Highest education level: Some college, no degree  Occupational History   Occupation: foster parent  Tobacco Use   Smoking status: Former    Packs/day: 0.20    Years: 30.00    Pack years: 6.00    Types: Cigarettes    Quit date: 11/19/2012    Years since quitting: 8.7   Smokeless tobacco: Never   Tobacco comments:    Not smoking currently, uses nicotine patch  Vaping Use   Vaping Use: Never used  Substance and Sexual Activity   Alcohol use: Yes    Comment: some   Drug use: No   Sexual activity: Yes    Birth control/protection: Surgical  Other Topics Concern   Not on file  Social History Narrative   does regular exercise. Three times a week   Social Determinants of Health   Financial Resource Strain: Medium Risk   Difficulty of Paying Living Expenses: Somewhat hard  Food Insecurity: No Food Insecurity   Worried About Charity fundraiser in the Last Year: Never true   Ran Out of Food in the Last Year: Never true  Transportation Needs: No Transportation Needs   Lack of Transportation (Medical): No   Lack of Transportation (Non-Medical): No  Physical Activity: Sufficiently Active   Days of Exercise per Week: 3 days   Minutes of Exercise per Session: 50 min  Stress: Stress Concern Present   Feeling of Stress : Rather much  Social Connections: Moderately Isolated   Frequency of Communication with Friends and Family: More than three times a week   Frequency of Social Gatherings with Friends and Family: Once a week    Attends Religious Services: 1 to 4 times per year   Active Member of [employer] or Organizations: No   Attends Music therapist: Never   Marital Status: Divorced    Tobacco Counseling Counseling given: Not Answered Tobacco comments: Not smoking currently, uses nicotine patch   Clinical Intake:  Pre-visit preparation completed: No  Pain : No/denies pain Pain Score: 0-No pain     Nutritional Risks: None Diabetes: No  How often do you need to have someone help you when you read instructions, pamphlets, or other written materials from your doctor or pharmacy?: 1 - Never What is the last grade level you completed in school?: 12th  Diabetic?no  Interpreter Needed?: No  Information  entered by :: Judeen Hammans   Activities of Daily Living In your present state of health, do you have any difficulty performing the following activities: 08/14/2021 02/20/2021  Hearing? N N  Vision? N N  Difficulty concentrating or making decisions? N N  Walking or climbing stairs? N N  Dressing or bathing? N N  Doing errands, shopping? N N  Preparing Food and eating ? N -  Using the Toilet? N -  In the past six months, have you accidently leaked urine? N -  Do you have problems with loss of bowel control? N -  Managing your Medications? N -  Housekeeping or managing your Housekeeping? N -  Some recent data might be hidden    Patient Care Team: Renee Rival, FNP as PCP - General (Nurse Practitioner) Daneil Dolin, MD (Gastroenterology)  Indicate any recent Medical Services you may have received from other than [facility] providers in the past year (date may be approximate).     Assessment:   This is a routine wellness examination for [place].  Hearing/Vision screen No results found.  Dietary issues and exercise activities discussed: Current Exercise Habits: Home exercise routine, Type of exercise: walking, Time (Minutes): 45, Frequency (Times/Week): 3, Weekly Exercise  (Minutes/Week): 135, Intensity: Moderate   Goals Addressed             This Visit's Progress    Eat Healthy       Timeframe:  Long-Range Goal Priority:  Medium Start Date:                             Expected End Date:                       Follow Up Date 08/12/2022/    - drink 6 to 8 glasses of water each day    Why is this important?   When you are ready to manage your nutrition or weight, having a plan and setting goals will help.  Taking small steps to change how you eat and exercise is a good place to start.    Notes:       Depression Screen PHQ 2/9 Scores 08/14/2021 07/12/2021 07/21/2019 07/13/2019 01/29/2018 09/09/2014  PHQ - 2 Score 0 0 0 0 0 0    Fall Risk Fall Risk  08/14/2021 07/12/2021 07/21/2019 07/21/2019 01/29/2018  Falls in the past year? 0 0 0 0 No  Number falls in past yr: 0 0 0 0 -  Injury with Fall? 0 0 0 0 -  Risk for fall due to : No Fall Risks No Fall Risks - - -  Follow up Falls evaluation completed Falls evaluation completed - - -    FALL RISK PREVENTION PERTAINING TO THE HOME:  Any stairs in or around the home? Yes  If so, are there any without handrails? No  Home free of loose throw rugs in walkways, pet beds, electrical cords, etc? Yes  Adequate lighting in your home to reduce risk of falls? Yes   ASSISTIVE DEVICES UTILIZED TO PREVENT FALLS:  Life alert? No  Use of a cane, walker or w/c? No  Grab bars in the bathroom? No  Shower chair or bench in shower? No  Elevated toilet seat or a handicapped toilet? No    Cognitive Function:     6CIT Screen 08/14/2021  What Year? 0 points  What month? 0 points  What time? 0  points  Count back from 20 0 points  Months in reverse 4 points  Repeat phrase 0 points  Total Score 4    Immunizations Immunization History  Administered Date(s) Administered   Influenza,inj,Quad PF,6+ Mos 07/18/2019, 05/01/2021   Influenza-Unspecified 06/10/2013, 05/16/2014, 05/19/2015, 04/29/2016, 05/21/2017, 07/23/2018    Moderna SARS-COV2 Booster Vaccination 06/15/2021   Moderna Sars-Covid-2 Vaccination 11/03/2019, 11/24/2019, 12/15/2020   Pneumococcal Polysaccharide-23 06/10/2013    TDAP status: Due, Education has been provided regarding the importance of this vaccine. Advised may receive this vaccine at local pharmacy or Health Dept. Aware to provide a copy of the vaccination record if obtained from local pharmacy or Health Dept. Verbalized acceptance and understanding.  Flu Vaccine status: Up to date  Pneumococcal vaccine status: Due, Education has been provided regarding the importance of this vaccine. Advised may receive this vaccine at local pharmacy or Health Dept. Aware to provide a copy of the vaccination record if obtained from local pharmacy or Health Dept. Verbalized acceptance and understanding.  Covid-19 vaccine status: Information provided on how to obtain vaccines.   Qualifies for Shingles Vaccine? Yes   Zostavax completed Yes   Shingrix Completed?: No.    Education has been provided regarding the importance of this vaccine. Patient has been advised to call insurance company to determine out of pocket expense if they have not yet received this vaccine. Advised may also receive vaccine at local pharmacy or Health Dept. Verbalized acceptance and understanding.  Screening Tests Health Maintenance  Topic Date Due   Hepatitis C Screening  Never done   TETANUS/TDAP  Never done   COLONOSCOPY (Pts 45-57yrs Insurance coverage will need to be confirmed)  Never done   Pneumococcal Vaccine 83-45 Years old (2 - PCV) 06/10/2014   COVID-19 Vaccine (4 - Booster) 08/10/2021   Zoster Vaccines- Shingrix (1 of 2) 10/10/2021 (Originally [birth year]24[birth year]1980)   MAMMOGRAM  10/19/2022   INFLUENZA VACCINE  Completed   HIV Screening  Completed   HPV VACCINES  Aged Out   PAP SMEAR-Modifier  Discontinued    Health Maintenance  Health Maintenance Due  Topic Date Due   Hepatitis C Screening  Never done    TETANUS/TDAP  Never done   COLONOSCOPY (Pts 45-74yrs Insurance coverage will need to be confirmed)  Never done   Pneumococcal Vaccine 40-46 Years old (2 - PCV) 06/10/2014   COVID-19 Vaccine (4 - Booster) 08/10/2021    Colorectal cancer screening: Referral to GI placed Pt wants to do cologaurd. Pt aware the office will call re: appt.  Mammogram status: Completed 10/18/2020. Repeat every year  Bone Density status: Ordered 08/14/2021. Pt provided with contact info and advised to call to schedule appt.  Lung Cancer Screening: (Low Dose CT Chest recommended if Age 108-80 years, 30 pack-year currently smoking OR have quit w/in 15years.) does not qualify.   Lung Cancer Screening Referral: n/a  Additional Screening:  Hepatitis C Screening: does qualify; Completed will discuss at next appt  Vision Screening: Recommended annual ophthalmology exams for early detection of glaucoma and other disorders of the eye. Is the patient up to date with their annual eye exam?  No  Who is the provider or what is the name of the office in which the patient attends annual eye exams? [facility] If pt is not established with a provider, would they like to be referred to a provider to establish care?  Pt has appt on 11/27/2021 .   Dental Screening: Recommended annual dental exams for proper oral hygiene  Community Resource Referral / Chronic Care Management: CRR required this visit?  Yes   CCM required this visit?  No      Plan:     I have personally reviewed and noted the following in the patients chart:   Medical and social history Use of alcohol, tobacco or illicit drugs  Current medications and supplements including opioid prescriptions. Patient is currently taking opioid prescriptions. Information provided to patient regarding non-opioid alternatives. Patient advised to discuss non-opioid treatment plan with their provider. Functional ability and status Nutritional status Physical  activity Advanced directives List of other physicians Hospitalizations, surgeries, and ER visits in previous 12 months Vitals Screenings to include cognitive, depression, and falls Referrals and appointments  In addition, I have reviewed and discussed with patient certain preventive protocols, quality metrics, and best practice recommendations. A written personalized care plan for preventive services as well as general preventive health recommendations were provided to patient.     Earline Mayotte, [place]   08/14/2021   Nurse Notes:  [name] , Thank you for taking time to come for your Medicare Wellness Visit. I appreciate your ongoing commitment to your health goals. Please review the following plan we discussed and let me know if I can assist you in the future.   These are the goals we discussed:  Goals      Eat Healthy     Timeframe:  Long-Range Goal Priority:  Medium Start Date:                             Expected End Date:                       Follow Up Date 08/12/2022/    - drink 6 to 8 glasses of water each day    Why is this important?   When you are ready to manage your nutrition or weight, having a plan and setting goals will help.  Taking small steps to change how you eat and exercise is a good place to start.    Notes:         This is a list of the screening recommended for you and due dates:  Health Maintenance  Topic Date Due   Hepatitis C Screening: USPSTF Recommendation to screen - Ages 19-79 yo.  Never done   Tetanus Vaccine  Never done   Colon Cancer Screening  Never done   Pneumococcal Vaccination (2 - PCV) 06/10/2014   COVID-19 Vaccine (4 - Booster) 08/10/2021   Zoster (Shingles) Vaccine (1 of 2) 10/10/2021*   Mammogram  10/19/2022   Flu Shot  Completed   HIV Screening  Completed   HPV Vaccine  Aged Out   Pap Smear  Discontinued  *Topic was postponed. The date shown is not the original due date.

## 2021-08-14 NOTE — Patient Instructions (Addendum)
[name] , Thank you for taking time to come for your Medicare Wellness Visit. I appreciate your ongoing commitment to your health goals. Please review the following plan we discussed and let me know if I can assist you in the future.   These are the goals we discussed:  Goals      Eat Healthy     Timeframe:  Long-Range Goal Priority:  Medium Start Date:                             Expected End Date:                       Follow Up Date 08/12/2022/    - drink 6 to 8 glasses of water each day    Why is this important?   When you are ready to manage your nutrition or weight, having a plan and setting goals will help.  Taking small steps to change how you eat and exercise is a good place to start.    Notes:         This is a list of the screening recommended for you and due dates:  Health Maintenance  Topic Date Due   Hepatitis C Screening: USPSTF Recommendation to screen - Ages 66-79 yo.  Never done   Tetanus Vaccine  Never done   Colon Cancer Screening  Never done   Pneumococcal Vaccination (2 - PCV) 06/10/2014   COVID-19 Vaccine (4 - Booster) 08/10/2021   Zoster (Shingles) Vaccine (1 of 2) 10/10/2021*   Mammogram  10/19/2022   Flu Shot  Completed   HIV Screening  Completed   HPV Vaccine  Aged Out   Pap Smear  Discontinued  *Topic was postponed. The date shown is not the original due date.    Health Maintenance, Female Adopting a healthy lifestyle and getting preventive care are important in promoting health and wellness. Ask your health care provider about: The right schedule for you to have regular tests and exams. Things you can do on your own to prevent diseases and keep yourself healthy. What should I know about diet, weight, and exercise? Eat a healthy diet  Eat a diet that includes plenty of vegetables, fruits, low-fat dairy products, and lean protein. Do not eat a lot of foods that are high in solid fats, added sugars, or sodium. Maintain a healthy  weight Body mass index (BMI) is used to identify weight problems. It estimates body fat based on height and weight. Your health care provider can help determine your BMI and help you achieve or maintain a healthy weight. Get regular exercise Get regular exercise. This is one of the most important things you can do for your health. Most adults should: Exercise for at least 150 minutes each week. The exercise should increase your heart rate and make you sweat (moderate-intensity exercise). Do strengthening exercises at least twice a week. This is in addition to the moderate-intensity exercise. Spend less time sitting. Even light physical activity can be beneficial. Watch cholesterol and blood lipids Have your blood tested for lipids and cholesterol at 62 years of age, then have this test every 5 years. Have your cholesterol levels checked more often if: Your lipid or cholesterol levels are high. You are older than 61 years of age. You are at high risk for heart disease. What should I know about cancer screening? Depending on your health history and family history,  you may need to have cancer screening at various ages. This may include screening for: Breast cancer. Cervical cancer. Colorectal cancer. Skin cancer. Lung cancer. What should I know about heart disease, diabetes, and high blood pressure? Blood pressure and heart disease High blood pressure causes heart disease and increases the risk of stroke. This is more likely to develop in people who have high blood pressure readings or are overweight. Have your blood pressure checked: Every 3-5 years if you are 63-60 years of age. Every year if you are 53 years old or older. Diabetes Have regular diabetes screenings. This checks your fasting blood sugar level. Have the screening done: Once every three years after age 20 if you are at a normal weight and have a low risk for diabetes. More often and at a younger age if you are overweight or  have a high risk for diabetes. What should I know about preventing infection? Hepatitis B If you have a higher risk for hepatitis B, you should be screened for this virus. Talk with your health care provider to find out if you are at risk for hepatitis B infection. Hepatitis C Testing is recommended for: Everyone born from [birth year] through 1965. Anyone with known risk factors for hepatitis C. Sexually transmitted infections (STIs) Get screened for STIs, including gonorrhea and chlamydia, if: You are sexually active and are younger than 61 years of age. You are older than 61 years of age and your health care provider tells you that you are at risk for this type of infection. Your sexual activity has changed since you were last screened, and you are at increased risk for chlamydia or gonorrhea. Ask your health care provider if you are at risk. Ask your health care provider about whether you are at high risk for HIV. Your health care provider may recommend a prescription medicine to help prevent HIV infection. If you choose to take medicine to prevent HIV, you should first get tested for HIV. You should then be tested every 3 months for as long as you are taking the medicine. Pregnancy If you are about to stop having your period (premenopausal) and you may become pregnant, seek counseling before you get pregnant. Take 400 to 800 micrograms (mcg) of folic acid every day if you become pregnant. Ask for birth control (contraception) if you want to prevent pregnancy. Osteoporosis and menopause Osteoporosis is a disease in which the bones lose minerals and strength with aging. This can result in bone fractures. If you are 72 years old or older, or if you are at risk for osteoporosis and fractures, ask your health care provider if you should: Be screened for bone loss. Take a calcium or vitamin D supplement to lower your risk of fractures. Be given hormone replacement therapy (HRT) to treat symptoms of  menopause. Follow these instructions at home: Alcohol use Do not drink alcohol if: Your health care provider tells you not to drink. You are pregnant, may be pregnant, or are planning to become pregnant. If you drink alcohol: Limit how much you have to: 0-1 drink a day. Know how much alcohol is in your drink. In the [place] one drink equals one 12 oz bottle of beer (355 mL), one 5 oz glass of wine (148 mL), or one 1 oz glass of hard liquor (44 mL). Lifestyle Do not use any products that contain nicotine or tobacco. These products include cigarettes, chewing tobacco, and vaping devices, such as e-cigarettes. If you need help quitting, ask your  health care provider. Do not use street drugs. Do not share needles. Ask your health care provider for help if you need support or information about quitting drugs. General instructions Schedule regular health, dental, and eye exams. Stay current with your vaccines. Tell your health care provider if: You often feel depressed. You have ever been abused or do not feel safe at home. Summary Adopting a healthy lifestyle and getting preventive care are important in promoting health and wellness. Follow your health care provider's instructions about healthy diet, exercising, and getting tested or screened for diseases. Follow your health care provider's instructions on monitoring your cholesterol and blood pressure. This information is not intended to replace advice given to you by your health care provider. Make sure you discuss any questions you have with your health care provider. Document Revised: 12/18/2020 Document Reviewed: 12/18/2020 Elsevier Patient Education  [place].

## 2021-09-12 ENCOUNTER — Ambulatory Visit (INDEPENDENT_AMBULATORY_CARE_PROVIDER_SITE_OTHER): Payer: Medicare HMO | Admitting: Nurse Practitioner

## 2021-09-12 ENCOUNTER — Other Ambulatory Visit: Payer: Self-pay

## 2021-09-12 ENCOUNTER — Encounter: Payer: Self-pay | Admitting: Nurse Practitioner

## 2021-09-12 VITALS — BP 148/82 | HR 76 | Ht 62.0 in | Wt 198.0 lb

## 2021-09-12 DIAGNOSIS — R69 Illness, unspecified: Secondary | ICD-10-CM | POA: Diagnosis not present

## 2021-09-12 DIAGNOSIS — E559 Vitamin D deficiency, unspecified: Secondary | ICD-10-CM | POA: Diagnosis not present

## 2021-09-12 DIAGNOSIS — I10 Essential (primary) hypertension: Secondary | ICD-10-CM | POA: Diagnosis not present

## 2021-09-12 DIAGNOSIS — J0120 Acute ethmoidal sinusitis, unspecified: Secondary | ICD-10-CM | POA: Diagnosis not present

## 2021-09-12 DIAGNOSIS — I700 Atherosclerosis of aorta: Secondary | ICD-10-CM

## 2021-09-12 DIAGNOSIS — Z6836 Body mass index (BMI) 36.0-36.9, adult: Secondary | ICD-10-CM

## 2021-09-12 DIAGNOSIS — F17200 Nicotine dependence, unspecified, uncomplicated: Secondary | ICD-10-CM | POA: Insufficient documentation

## 2021-09-12 DIAGNOSIS — Z131 Encounter for screening for diabetes mellitus: Secondary | ICD-10-CM | POA: Diagnosis not present

## 2021-09-12 MED ORDER — FLUTICASONE PROPIONATE 50 MCG/ACT NA SUSP: 2.0000 | Freq: Every day | NASAL | 6 refills | Status: DC

## 2021-09-12 MED ORDER — BUPROPION HCL ER (SR) 150 MG PO TB12: ORAL_TABLET | ORAL | 3 refills | Status: DC

## 2021-09-12 MED ORDER — LISINOPRIL 2.5 MG PO TABS: 2.5000 mg | ORAL_TABLET | Freq: Every day | ORAL | 1 refills | Status: DC

## 2021-09-12 NOTE — Assessment & Plan Note (Signed)
Check vitamin D level today 

## 2021-09-12 NOTE — Assessment & Plan Note (Addendum)
Patient stated that she has started smoking again.  She is interested in smoking cessation medication.  bupropionis 150 mg/day for three days, then 150 mg twice daily thereafter   treating for at least 12 weeks.  Starts 1 week before quit date.  Side effects of medication discussed with patient, she verbalized understanding.

## 2021-09-12 NOTE — Assessment & Plan Note (Signed)
Lipid panel today  she has started smoking again, smokes 4 cigarettes daily. Pt educated on the need to quit smoking she verbalized understanding, she has started using nicotine .

## 2021-09-12 NOTE — Patient Instructions (Addendum)
Start taking lisinopril 2.5 mg for blood pressure. Take amlodipine 10 mg for blood pressure. Use Flonase nasal spray 2 sprays daily for your sinuses I will get back to you about your smoking cessation medication.  Please get your labs done today.   It is important that you exercise regularly at least 30 minutes 5 times a week.  Think about what you will eat, plan ahead. Choose " clean, green, fresh or frozen" over canned, processed or packaged foods which are more sugary, salty and fatty. 70 to 75% of food eaten should be vegetables and fruit. Three meals at set times with snacks allowed between meals, but they must be fruit or vegetables. Aim to eat over a 12 hour period , example 7 am to 7 pm, and STOP after  your last meal of the day. Drink water,generally about 64 ounces per day, no other drink is as healthy. Fruit juice is best enjoyed in a healthy way, by EATING the fruit.  Thanks for choosing [facility], we consider it a privelige to serve you.

## 2021-09-12 NOTE — Assessment & Plan Note (Signed)
Rx Flonase nasal spray used to spray daily.

## 2021-09-12 NOTE — Assessment & Plan Note (Addendum)
DASH diet and commitment to daily physical activity for a minimum of 30 minutes discussed and encouraged, as a part of hypertension management. The importance of attaining a healthy weight is also discussed.  BP/Weight 09/12/2021 07/12/2021 05/01/2021 02/25/2021 02/20/2021 12/11/2020 3/42/8768  Systolic BP 115 726 203 559 - 741 638  Diastolic BP 82 80 88 97 - 88 81  Wt. (Lbs) 198.04 203 202.12 - 197 - -  BMI 36.22 36.54 36.38 - 36.03 37.26 -   Continue amlodipine 10 mg daily Start lisinopril 2.5 mg daily Check eGFR today, and recheck in 2 weeks Follow-up for blood pressure check in 6 weeks.

## 2021-09-12 NOTE — Assessment & Plan Note (Signed)
Wt Readings from Last 3 Encounters:  09/12/21 198 lb 0.6 oz (89.8 kg)  07/12/21 203 lb (92.1 kg)  05/01/21 202 lb 1.9 oz (91.7 kg)    Importance of healthy food choices with portion control discussed as well as eating regularly within 12  hour window.   The need to choose clean green food 50%-75% of time is discussed as well as make water the primary drink and set a goal for 64 ounces daily.  Patient reeducated about the importance of committment to minimum of 150 minutes of exercise per week.  Three meals at set times with snacks allowed between meals but they must be fruit or vegetable.   Aim to eat  over 12 hour period  for example 7 am to 7 pm. Stop after your last meal of the day.

## 2021-09-12 NOTE — Progress Notes (Signed)
° °  Francene Mcerlean     MRN: [id number]      DOB: 11/10/1960   HPI Ms. Gorton is here for follow up for her blood pressure  and re-evaluation of chronic medical conditions, medication management and review of any available recent lab and radiology data.  Preventive health is updated, specifically  Cancer screening and Immunization.   Questions or concerns regarding consultations or procedures which the PT has had in the interim are  addressed. The PT denies any adverse reactions to current medications since the last visit.  There are no new concerns.  There are no specific complaints   Patient takes amlodipine 10 mg daily, systolic blood pressure at home has been mostly in daily 140s 150s and sometimes the low 120s   Pt c/o of sinus pressure, it hurts to touch my face, has stuffy nose, no sneezing, some HA, denies nasal discharged, SOB wheezing. She tried some OTC sinus spray, it did not help. She has never have symptoms before.      ROS Denies recent fever or chills. Has sinus pressure, nasal congestion, no ear pain or sore throat. Denies chest congestion, productive cough or wheezing. Denies chest pains, palpitations and leg swelling Denies abdominal pain, nausea, vomiting,diarrhea or constipation.   Denies dysuria, frequency, hesitancy or incontinence. Denies depression, anxiety or insomnia.    PE  BP (!) 148/82 (BP Location: Left Arm, Cuff Size: Normal)    Pulse 76    Ht 5\' 2"  (1.575 m)    Wt 198 lb 0.6 oz (89.8 kg)    SpO2 97%    BMI 36.22 kg/m   Patient alert and oriented and in no cardiopulmonary distress.  HEENT: No facial asymmetry, EOMI,     Neck supple .voiced tenderness of ethmoid sinus area on palpation   Chest: Clear to auscultation bilaterally.  CVS: S1, S2 no murmurs, no S3.Regular rate.  ABD: Soft non tender.   Ext: No edema  MS: Adequate ROM spine, shoulders, hips and knees.  Skin: Intact, no ulcerations or rash noted.  Psych: Good eye contact,  normal affect. Memory intact not anxious or depressed appearing.   Assessment & Plan

## 2021-09-13 ENCOUNTER — Other Ambulatory Visit: Payer: Self-pay | Admitting: Nurse Practitioner

## 2021-09-13 DIAGNOSIS — I700 Atherosclerosis of aorta: Secondary | ICD-10-CM

## 2021-09-13 DIAGNOSIS — E782 Mixed hyperlipidemia: Secondary | ICD-10-CM

## 2021-09-13 LAB — CMP14+EGFR
ALT: 23 IU/L (ref 0–32)
AST: 33 IU/L (ref 0–40)
Albumin/Globulin Ratio: 1 — ABNORMAL LOW (ref 1.2–2.2)
Albumin: 3.9 g/dL (ref 3.8–4.9)
Alkaline Phosphatase: 152 IU/L — ABNORMAL HIGH (ref 44–121)
BUN/Creatinine Ratio: 6 — ABNORMAL LOW (ref 12–28)
BUN: 4 mg/dL — ABNORMAL LOW (ref 8–27)
Bilirubin Total: 0.8 mg/dL (ref 0.0–1.2)
CO2: 28 mmol/L (ref 20–29)
Calcium: 9.5 mg/dL (ref 8.7–10.3)
Chloride: 100 mmol/L (ref 96–106)
Creatinine, Ser: 0.72 mg/dL (ref 0.57–1.00)
Globulin, Total: 4.1 g/dL (ref 1.5–4.5)
Glucose: 89 mg/dL (ref 70–99)
Potassium: 3.5 mmol/L (ref 3.5–5.2)
Sodium: 142 mmol/L (ref 134–144)
Total Protein: 8 g/dL (ref 6.0–8.5)
eGFR: 96 mL/min/{1.73_m2} (ref >59)

## 2021-09-13 LAB — LIPID PANEL
Chol/HDL Ratio: 6.3 ratio — ABNORMAL HIGH (ref 0.0–4.4)
Cholesterol, Total: 221 mg/dL — ABNORMAL HIGH (ref 100–199)
HDL: 35 mg/dL — ABNORMAL LOW (ref >39)
LDL Chol Calc (NIH): 131 mg/dL — ABNORMAL HIGH (ref 0–99)
Triglycerides: 307 mg/dL — ABNORMAL HIGH (ref 0–149)
VLDL Cholesterol Cal: 55 mg/dL — ABNORMAL HIGH (ref 5–40)

## 2021-09-13 LAB — HEMOGLOBIN A1C
Est. average glucose Bld gHb Est-mCnc: 111 mg/dL
Hgb A1c MFr Bld: 5.5 % (ref 4.8–5.6)

## 2021-09-13 MED ORDER — ATORVASTATIN CALCIUM 10 MG PO TABS: 10.0000 mg | ORAL_TABLET | Freq: Every day | ORAL | 3 refills | Status: DC

## 2021-09-14 ENCOUNTER — Telehealth: Payer: Self-pay

## 2021-09-14 NOTE — Telephone Encounter (Signed)
Called pt back advised of lab results

## 2021-09-14 NOTE — Telephone Encounter (Signed)
Patient returning call lab results. 

## 2021-10-03 DIAGNOSIS — I10 Essential (primary) hypertension: Secondary | ICD-10-CM | POA: Diagnosis not present

## 2021-10-03 DIAGNOSIS — I700 Atherosclerosis of aorta: Secondary | ICD-10-CM | POA: Diagnosis not present

## 2021-10-03 DIAGNOSIS — E559 Vitamin D deficiency, unspecified: Secondary | ICD-10-CM | POA: Diagnosis not present

## 2021-10-04 ENCOUNTER — Other Ambulatory Visit: Payer: Self-pay | Admitting: Nurse Practitioner

## 2021-10-04 DIAGNOSIS — E876 Hypokalemia: Secondary | ICD-10-CM

## 2021-10-04 LAB — CMP14+EGFR
ALT: 20 IU/L (ref 0–32)
AST: 45 IU/L — ABNORMAL HIGH (ref 0–40)
Albumin/Globulin Ratio: 1.1 — ABNORMAL LOW (ref 1.2–2.2)
Albumin: 4 g/dL (ref 3.8–4.9)
Alkaline Phosphatase: 114 IU/L (ref 44–121)
BUN/Creatinine Ratio: 7 — ABNORMAL LOW (ref 12–28)
BUN: 5 mg/dL — ABNORMAL LOW (ref 8–27)
Bilirubin Total: 0.6 mg/dL (ref 0.0–1.2)
CO2: 24 mmol/L (ref 20–29)
Calcium: 9.8 mg/dL (ref 8.7–10.3)
Chloride: 97 mmol/L (ref 96–106)
Creatinine, Ser: 0.71 mg/dL (ref 0.57–1.00)
Globulin, Total: 3.7 g/dL (ref 1.5–4.5)
Glucose: 90 mg/dL (ref 70–99)
Potassium: 3.2 mmol/L — ABNORMAL LOW (ref 3.5–5.2)
Sodium: 140 mmol/L (ref 134–144)
Total Protein: 7.7 g/dL (ref 6.0–8.5)
eGFR: 97 mL/min/{1.73_m2} (ref >59)

## 2021-10-04 LAB — VITAMIN D 25 HYDROXY (VIT D DEFICIENCY, FRACTURES): Vit D, 25-Hydroxy: 24 ng/mL — ABNORMAL LOW (ref 30.0–100.0)

## 2021-10-04 MED ORDER — POTASSIUM CHLORIDE CRYS ER 20 MEQ PO TBCR: 20.0000 meq | EXTENDED_RELEASE_TABLET | Freq: Every day | ORAL | 0 refills | Status: DC

## 2021-10-04 NOTE — Progress Notes (Signed)
Please review labs with patient. potassium level is low, I have sent in a prescription of potassium to her pharmacy. She should take 1 tablet of potassium daily for 4 days, patient should come to the clinic to have her potassium level checked on Monday. Vitamin D level is low, patient should start taking vitamin D 1000 units daily. Thank you.

## 2021-10-09 DIAGNOSIS — E876 Hypokalemia: Secondary | ICD-10-CM | POA: Diagnosis not present

## 2021-10-10 ENCOUNTER — Other Ambulatory Visit: Payer: Self-pay | Admitting: Nurse Practitioner

## 2021-10-10 DIAGNOSIS — I10 Essential (primary) hypertension: Secondary | ICD-10-CM

## 2021-10-10 LAB — POTASSIUM: Potassium: 3.5 mmol/L (ref 3.5–5.2)

## 2021-10-10 NOTE — Progress Notes (Signed)
Potassium level is normal. Thank you

## 2021-10-25 ENCOUNTER — Ambulatory Visit (INDEPENDENT_AMBULATORY_CARE_PROVIDER_SITE_OTHER): Payer: Medicare HMO | Admitting: Nurse Practitioner

## 2021-10-25 ENCOUNTER — Other Ambulatory Visit: Payer: Self-pay

## 2021-10-25 ENCOUNTER — Encounter: Payer: Self-pay | Admitting: Nurse Practitioner

## 2021-10-25 VITALS — BP 142/84 | HR 69 | Ht 62.0 in | Wt 198.0 lb

## 2021-10-25 DIAGNOSIS — I10 Essential (primary) hypertension: Secondary | ICD-10-CM | POA: Diagnosis not present

## 2021-10-25 DIAGNOSIS — I700 Atherosclerosis of aorta: Secondary | ICD-10-CM

## 2021-10-25 MED ORDER — LISINOPRIL 5 MG PO TABS: 5.0000 mg | ORAL_TABLET | Freq: Every day | ORAL | 3 refills | Status: DC

## 2021-10-25 NOTE — Progress Notes (Signed)
? ?  Henrine Hayter     MRN: [id number]      DOB: 04-25-1961 ? ? ?HPI ?Ms. [name] with medical history of high blood pressure, aortic atherosclerosis, GERD is here for follow up for hyperlipidemia and hypertension.  Patient denies adverse reactions to current medications since the last visit.  States that she has been taking her medications as prescribed.  States that when she checked her blood pressure at home systolic has been in the 140s.  ? ?Patient encouraged to get Tdap vaccine and shingles vaccine at her pharmacy, states that she has not received Cologuard test supply ? ?ROS ?Denies recent fever or chills. ?Denies sinus pressure, nasal congestion, ear pain or sore throat. ?Denies chest congestion, productive cough or wheezing. ?Denies chest pains, palpitations and leg swelling ?Denies abdominal pain, nausea, vomiting,diarrhea or constipation.   ?Denies dysuria, frequency, hesitancy or incontinence. ?Denies joint pain, swelling and limitation in mobility. ?Denies headaches, seizures, numbness, or tingling. ?Denies depression, anxiety or insomnia. ?. ? ? ?PE ? ?BP 140/72 (BP Location: Right Arm, Cuff Size: Large)   Pulse 69   Ht 5\' 2"  (1.575 m)   Wt 198 lb (89.8 kg)   SpO2 100%   BMI 36.21 kg/m?  ? ?Patient alert and oriented and in no cardiopulmonary distress.. ? ?Chest: Clear to auscultation bilaterally. ? ?CVS: S1, S2 no murmurs, no S3.Regular rate. ? ?ABD: Soft non tender.  ? ?MS: Adequate ROM spine, shoulders, hips and knees. ? ?Skin: Intact, no ulcerations or rash noted. ? ?Psych: Good eye contact, normal affect. Memory intact not anxious or depressed appearing. ?. ? ? ?Assessment & Plan ?High blood pressure ?BP Readings from Last 3 Encounters:  ?10/25/21 (!) 142/84  ?09/12/21 (!) 148/82  ?07/12/21 (!) 156/80  ?Takes lisinopril 2.5 mg daily, amlodipine 10 mg daily ?Blood pressure still elevated in office today. ?Patient told to start taking lisinopril 5 mg daily, continue amlodipine 10 mg daily,  check BMP in 2 weeks. ?DASH diet advised, exercise 30 minutes daily 5 days a week.  Patient encouraged to continue to monitor blood pressure at home keep a log and bring to next visit ? ?Aortic atherosclerosis (HCC) ?Takes atorvastatin 10 mg daily, lipid panel ordered today, patient will get labs done in 2 weeks.   ? ?

## 2021-10-25 NOTE — Assessment & Plan Note (Signed)
BP Readings from Last 3 Encounters:  ?10/25/21 (!) 142/84  ?09/12/21 (!) 148/82  ?07/12/21 (!) 156/80  ?Takes lisinopril 2.5 mg daily, amlodipine 10 mg daily ?Blood pressure still elevated in office today. ?Patient told to start taking lisinopril 5 mg daily, continue amlodipine 10 mg daily, check BMP in 2 weeks. ?DASH diet advised, exercise 30 minutes daily 5 days a week.  Patient encouraged to continue to monitor blood pressure at home keep a log and bring to next visit ?

## 2021-10-25 NOTE — Patient Instructions (Addendum)
Please get your fasting labs done in 2 weeks  ?Please start taking lisinopril 5mg  daily for your high blood pressure ?Please get your tdap and shingles vaccines at your pharmacy.  ? ? ? ?It is important that you exercise regularly at least 30 minutes 5 times a week.  ?Think about what you will eat, plan ahead. ?Choose " clean, green, fresh or frozen" over canned, processed or packaged foods which are more sugary, salty and fatty. ?70 to 75% of food eaten should be vegetables and fruit. ?Three meals at set times with snacks allowed between meals, but they must be fruit or vegetables. ?Aim to eat over a 12 hour period , example 7 am to 7 pm, and STOP after  your last meal of the day. ?Drink water,generally about 64 ounces per day, no other drink is as healthy. Fruit juice is best enjoyed in a healthy way, by EATING the fruit. ? ?Thanks for choosing [facility], we consider it a privelige to serve you. ? ?

## 2021-10-25 NOTE — Assessment & Plan Note (Signed)
Takes atorvastatin 10 mg daily, lipid panel ordered today, patient will get labs done in 2 weeks.  ?

## 2021-11-27 DIAGNOSIS — Z0100 Encounter for examination of eyes and vision without abnormal findings: Secondary | ICD-10-CM | POA: Diagnosis not present

## 2021-11-27 DIAGNOSIS — H52229 Regular astigmatism, unspecified eye: Secondary | ICD-10-CM | POA: Diagnosis not present

## 2021-12-18 DIAGNOSIS — I10 Essential (primary) hypertension: Secondary | ICD-10-CM | POA: Diagnosis not present

## 2021-12-18 DIAGNOSIS — I700 Atherosclerosis of aorta: Secondary | ICD-10-CM | POA: Diagnosis not present

## 2021-12-19 ENCOUNTER — Other Ambulatory Visit: Payer: Self-pay | Admitting: Nurse Practitioner

## 2021-12-19 ENCOUNTER — Other Ambulatory Visit: Payer: Self-pay

## 2021-12-19 ENCOUNTER — Telehealth: Payer: Self-pay

## 2021-12-19 DIAGNOSIS — E782 Mixed hyperlipidemia: Secondary | ICD-10-CM

## 2021-12-19 LAB — BASIC METABOLIC PANEL
BUN/Creatinine Ratio: 8 — ABNORMAL LOW (ref 12–28)
BUN: 5 mg/dL — ABNORMAL LOW (ref 8–27)
CO2: 23 mmol/L (ref 20–29)
Calcium: 9.8 mg/dL (ref 8.7–10.3)
Chloride: 102 mmol/L (ref 96–106)
Creatinine, Ser: 0.61 mg/dL (ref 0.57–1.00)
Glucose: 85 mg/dL (ref 70–99)
Potassium: 3.6 mmol/L (ref 3.5–5.2)
Sodium: 141 mmol/L (ref 134–144)
eGFR: 102 mL/min/{1.73_m2} (ref >59)

## 2021-12-19 LAB — LIPID PANEL
Chol/HDL Ratio: 5.9 ratio — ABNORMAL HIGH (ref 0.0–4.4)
Cholesterol, Total: 240 mg/dL — ABNORMAL HIGH (ref 100–199)
HDL: 41 mg/dL (ref >39)
LDL Chol Calc (NIH): 114 mg/dL — ABNORMAL HIGH (ref 0–99)
Triglycerides: 490 mg/dL — ABNORMAL HIGH (ref 0–149)
VLDL Cholesterol Cal: 85 mg/dL — ABNORMAL HIGH (ref 5–40)

## 2021-12-19 MED ORDER — OMEGA-3-ACID ETHYL ESTERS 1 G PO CAPS: 2.0000 g | ORAL_CAPSULE | Freq: Two times a day (BID) | ORAL | 0 refills | Status: DC

## 2021-12-19 MED ORDER — ATORVASTATIN CALCIUM 20 MG PO TABS: 20.0000 mg | ORAL_TABLET | Freq: Every day | ORAL | 3 refills | Status: DC

## 2021-12-19 NOTE — Telephone Encounter (Signed)
Pt called in regards to labs advised of results pt verbalized understanding. Is lovaza suppose to be at pharmacy? I see the atorvastatin went. Please advise ?

## 2021-12-19 NOTE — Progress Notes (Signed)
LDL not at goal of less than 100 but better than it was . Start taking atorvastatin 20mg  daily , recheck labs at next visit, avoid fried fatty foods  ? ?Triglycerides are elevated start taking lovaza 2g BID  ? ? ? ?The 10-year ASCVD risk score (Arnett DK, et al., 2019) is: 23.7% ?  Values used to calculate the score: ?    Age: 61 years ?    Sex: Female ?    Is Non-Hispanic African American: Yes ?    Diabetic: No ?    Tobacco smoker: Yes ?    Systolic Blood Pressure: 142 mmHg ?    Is BP treated: Yes ?    HDL Cholesterol: 41 mg/dL ?    Total Cholesterol: 240 mg/dL ?

## 2021-12-26 ENCOUNTER — Other Ambulatory Visit: Payer: Self-pay | Admitting: Nurse Practitioner

## 2021-12-26 ENCOUNTER — Telehealth: Payer: Self-pay

## 2021-12-26 DIAGNOSIS — E7849 Other hyperlipidemia: Secondary | ICD-10-CM

## 2021-12-26 MED ORDER — FENOFIBRATE 48 MG PO TABS: 48.0000 mg | ORAL_TABLET | Freq: Every day | ORAL | 0 refills | Status: DC

## 2021-12-26 NOTE — Telephone Encounter (Signed)
Spoke with pt fola had sent in [place] but insurance will not cover it. Fola sent in fenofibrate to pharmacy instead. Pt aware ?

## 2022-01-02 ENCOUNTER — Other Ambulatory Visit (HOSPITAL_COMMUNITY): Payer: Self-pay | Admitting: Family Medicine

## 2022-01-02 ENCOUNTER — Ambulatory Visit (HOSPITAL_COMMUNITY)
Admission: RE | Admit: 2022-01-02 | Discharge: 2022-01-02 | Disposition: A | Discharge status: Home / Self Care | Payer: Medicare HMO | Source: Ambulatory Visit | Attending: Family Medicine | Admitting: Family Medicine

## 2022-01-02 DIAGNOSIS — Z780 Asymptomatic menopausal state: Secondary | ICD-10-CM | POA: Insufficient documentation

## 2022-01-02 DIAGNOSIS — Z1231 Encounter for screening mammogram for malignant neoplasm of breast: Secondary | ICD-10-CM | POA: Insufficient documentation

## 2022-02-06 ENCOUNTER — Encounter: Payer: Self-pay | Admitting: Nurse Practitioner

## 2022-02-06 ENCOUNTER — Ambulatory Visit (INDEPENDENT_AMBULATORY_CARE_PROVIDER_SITE_OTHER): Payer: Medicare HMO | Admitting: Nurse Practitioner

## 2022-02-06 VITALS — BP 130/84 | HR 69 | Ht 62.0 in | Wt 200.0 lb

## 2022-02-06 DIAGNOSIS — Z9884 Bariatric surgery status: Secondary | ICD-10-CM | POA: Insufficient documentation

## 2022-02-06 DIAGNOSIS — I10 Essential (primary) hypertension: Secondary | ICD-10-CM | POA: Diagnosis not present

## 2022-02-06 DIAGNOSIS — Z0001 Encounter for general adult medical examination with abnormal findings: Secondary | ICD-10-CM | POA: Diagnosis not present

## 2022-02-06 DIAGNOSIS — E559 Vitamin D deficiency, unspecified: Secondary | ICD-10-CM | POA: Diagnosis not present

## 2022-02-06 DIAGNOSIS — Z1211 Encounter for screening for malignant neoplasm of colon: Secondary | ICD-10-CM | POA: Insufficient documentation

## 2022-02-06 DIAGNOSIS — Z0000 Encounter for general adult medical examination without abnormal findings: Secondary | ICD-10-CM

## 2022-02-06 MED ORDER — ABDOMINAL BINDER/ELASTIC LARGE MISC: 1.0000 | Freq: Every day | 0 refills | Status: DC

## 2022-02-06 NOTE — Assessment & Plan Note (Addendum)
Wears abdominal binder daily. Wants to know if medication would cover the cost of another abdominal binder. Abdominal binder ordered today

## 2022-02-06 NOTE — Progress Notes (Signed)
Complete physical exam  Patient: [name]   DOB: 1960/09/21   61 y.o. Female  MRN: [id number]  Subjective:    Chief Complaint  Patient presents with   Annual Exam    cpe    [name] is a 61 y.o. female with past medical history of high blood pressure, aortic atherosclerosis, GERD, vitamin D deficiency, history of gastric bypass who presents today for a complete physical exam. She reports consuming a low fat diet. Goes to [facility] 3 days a week for exercises, She generally feels well. She reports sleeping well. She does not have additional problems to discuss today.     She stopped smoking since a month and half ago.  She denies shortness of breath, wheezing.  Patient congratulated on her smoking cessation encouraged to continue to abstain from smoking.  Had gastric bypass surgery about 2 years ago ,has had 2 hernia surgeries after that. She wears abdominal  binder daily for support, denies , abdominal pain, N/V/ diarrhea.    Most recent fall risk assessment:    02/06/2022    9:16 AM  [place] in the past year? 0  Number falls in past yr: 0  Injury with Fall? 0  Risk for fall due to : No Fall Risks  Follow up Falls evaluation completed     Most recent depression screenings:    02/06/2022    9:16 AM 10/25/2021    8:50 AM  PHQ 2/9 Scores  PHQ - 2 Score 0 0        Patient Care Team: Renee Rival, FNP as PCP - General (Nurse Practitioner) Daneil Dolin, MD (Gastroenterology)   Outpatient Medications Prior to Visit  Medication Sig   amLODipine (NORVASC) 10 MG tablet Take 1 tablet (10 mg total) by mouth daily.   atorvastatin (LIPITOR) 20 MG tablet Take 1 tablet (20 mg total) by mouth daily.   Blood Pressure Monitoring (BLOOD PRESSURE KIT) KIT Check your blood pressure 3 x a week   fenofibrate (TRICOR) 48 MG tablet Take 1 tablet (48 mg total) by mouth daily.   fluticasone (FLONASE) 50 MCG/ACT nasal spray Place 2 sprays into both nostrils  daily.   lisinopril (ZESTRIL) 5 MG tablet Take 1 tablet (5 mg total) by mouth daily.   Multiple Vitamins-Minerals (WOMENS 50+ MULTI VITAMIN/MIN PO) Take 1 tablet by mouth daily.   omega-3 acid ethyl esters (LOVAZA) 1 g capsule Take 2 capsules (2 g total) by mouth 2 (two) times daily.   RESTASIS 0.05 % ophthalmic emulsion 1 drop 2 (two) times daily.   ibuprofen (ADVIL) 800 MG tablet Take 1 tablet (800 mg total) by mouth every 8 (eight) hours as needed. (Patient not taking: Reported on 02/06/2022)   [DISCONTINUED] buPROPion (WELLBUTRIN SR) 150 MG 12 hr tablet Take 118m daily for 3 days, then 1585mtwo times daily afterwards for 7 weeks (Patient not taking: Reported on 02/06/2022)   No facility-administered medications prior to visit.    Review of Systems  Constitutional: Negative.  Negative for chills, fever and weight loss.  HENT: Negative.  Negative for ear pain, hearing loss, nosebleeds, sore throat and tinnitus.   Eyes: Negative.  Negative for photophobia, pain, discharge and redness.  Respiratory: Negative.  Negative for cough, hemoptysis, sputum production, shortness of breath and wheezing.   Cardiovascular: Negative.  Negative for chest pain, palpitations, orthopnea, claudication and leg swelling.  Gastrointestinal: Negative.  Negative for abdominal pain, blood in stool, constipation, diarrhea, heartburn, melena,  nausea and vomiting.  Genitourinary: Negative.  Negative for dysuria, frequency, hematuria and urgency.  Musculoskeletal: Negative.  Negative for back pain, falls, joint pain and myalgias.  Skin: Negative.  Negative for itching and rash.  Neurological: Negative.  Negative for dizziness, tingling, tremors, sensory change, speech change and headaches.  Endo/Heme/Allergies: Negative.  Negative for environmental allergies and polydipsia. Does not bruise/bleed easily.  Psychiatric/Behavioral: Negative.  Negative for depression, hallucinations, substance abuse and suicidal ideas. The  patient is not nervous/anxious.           Objective:     BP 130/84 (BP Location: Right Arm, Patient Position: Sitting, Cuff Size: Large)   Pulse 69   Ht _0  (1.575 m)   Wt 200 lb (90.7 kg)   SpO2 100%   BMI 36.58 kg/m    Physical Exam Vitals and nursing note reviewed. Exam conducted with a chaperone present.  Constitutional:      General: She is not in acute distress.    Appearance: She is obese. She is not ill-appearing, toxic-appearing or diaphoretic.  HENT:     Head: Normocephalic and atraumatic.     Right Ear: Tympanic membrane, ear canal and external ear normal. There is no impacted cerumen.     Left Ear: Tympanic membrane, ear canal and external ear normal. There is no impacted cerumen.     Nose: No congestion or rhinorrhea.     Mouth/Throat:     Mouth: Mucous membranes are moist.     Pharynx: Oropharynx is clear. No oropharyngeal exudate or posterior oropharyngeal erythema.  Eyes:     General: No scleral icterus.       Right eye: No discharge.        Left eye: No discharge.     Extraocular Movements: Extraocular movements intact.     Pupils: Pupils are equal, round, and reactive to light.  Neck:     Vascular: No carotid bruit.  Cardiovascular:     Rate and Rhythm: Normal rate and regular rhythm.     Pulses: Normal pulses.     Heart sounds: Normal heart sounds. No murmur heard.    No friction rub. No gallop.  Pulmonary:     Effort: Pulmonary effort is normal. No respiratory distress.     Breath sounds: Normal breath sounds. No stridor. No wheezing, rhonchi or rales.  Chest:     Chest wall: No tenderness.  Abdominal:     General: Bowel sounds are normal. There is no distension.     Palpations: Abdomen is soft. There is no mass.     Tenderness: There is no abdominal tenderness. There is no right CVA tenderness, left CVA tenderness, guarding or rebound.     Hernia: No hernia is present.  Musculoskeletal:        General: No swelling, tenderness, deformity  or signs of injury. Normal range of motion.     Cervical back: Normal range of motion and neck supple. No rigidity or tenderness.     Right lower leg: No edema.     Left lower leg: No edema.  Lymphadenopathy:     Cervical: No cervical adenopathy.  Skin:    Capillary Refill: Capillary refill takes less than 2 seconds.     Coloration: Skin is not jaundiced or pale.     Findings: No bruising, erythema or lesion.  Neurological:     Mental Status: She is alert and oriented to person, place, and time.     Cranial Nerves: No cranial nerve  deficit.     Sensory: No sensory deficit.     Motor: No weakness.     Coordination: Coordination normal.     Gait: Gait normal.     Deep Tendon Reflexes: Reflexes normal.  Psychiatric:        Mood and Affect: Mood normal.        Behavior: Behavior normal.        Thought Content: Thought content normal.        Judgment: Judgment normal.      No results found for any visits on 02/06/22.     Assessment & Plan:    Routine Health Maintenance and Physical Exam  Immunization History  Administered Date(s) Administered   Influenza,inj,Quad PF,6+ Mos 07/18/2019, 05/01/2021   Influenza-Unspecified 06/10/2013, 05/16/2014, 05/19/2015, 04/29/2016, 05/21/2017, 07/23/2018   Moderna SARS-COV2 Booster Vaccination 06/15/2021   Moderna Sars-Covid-2 Vaccination 11/03/2019, 11/24/2019, 12/15/2020   Pneumococcal Polysaccharide-23 06/10/2013    Health Maintenance  Topic Date Due   Hepatitis C Screening  Never done   TETANUS/TDAP  Never done   Zoster Vaccines- Shingrix (1 of 2) Never done   COLONOSCOPY (Pts 45-59yr Insurance coverage will need to be confirmed)  Never done   INFLUENZA VACCINE  03/12/2022   MAMMOGRAM  01/03/2024   HIV Screening  Completed   HPV VACCINES  Aged Out   PAP SMEAR-Modifier  Discontinued   COVID-19 Vaccine  Discontinued    Discussed health benefits of physical activity, and encouraged her to engage in regular exercise appropriate  for her age and condition.  Problem List Items Addressed This Visit       Cardiovascular and Mediastinum   High blood pressure    BP Readings from Last 3 Encounters:  02/06/22 130/84  10/25/21 (!) 142/84  09/12/21 (!) 148/82  Chronic condition well-controlled on lisinopril 5 mg daily, amlodipine 10 mg daily Continue current medications DASH diet advised engage in regular walking exercises at least for 150 minutes weekly      Relevant Orders   Lipid Profile   CBC with Differential   TSH   Vitamin D (25 hydroxy)   Hepatitis C Antibody     Other   Vitamin D deficiency    Last vitamin D Lab Results  Component Value Date   VD25OH 24.0 (L) 10/03/2021  not on vitamin D supplement Check labs today        Annual physical exam - Primary    Annual exam as documented.  Counseling done include healthy lifestyle involving committing to 150 minutes of exercise per week, heart healthy diet, and attaining healthy weight. The importance of adequate sleep also discussed.  Regular use of seat belt and home safety were also discussed . Changes in health habits are decided on by patient with goals and time frames set for achieving them. Cologuard ordered today.  Patient encouraged to get Tdap vaccine shingles vaccine at her pharmacy      History of gastric bypass    Wears abdominal binder daily. Wants to know if medication would cover the cost of another abdominal binder. Abdominal binder ordered today      Relevant Medications   Elastic Bandages & Supports (ABDOMINAL BINDER/ELASTIC LARGE) MISC   Other Visit Diagnoses     Screening for colon cancer       Relevant Orders   Cologuard      Return in about 6 months (around 08/08/2022).     FRenee Rival FNP

## 2022-02-06 NOTE — Assessment & Plan Note (Signed)
Last vitamin D Lab Results  Component Value Date   VD25OH 24.0 (L) 10/03/2021  not on vitamin D supplement Check labs today

## 2022-02-06 NOTE — Assessment & Plan Note (Signed)
Annual exam as documented.  Counseling done include healthy lifestyle involving committing to 150 minutes of exercise per week, heart healthy diet, and attaining healthy weight. The importance of adequate sleep also discussed.  Regular use of seat belt and home safety were also discussed . Changes in health habits are decided on by patient with goals and time frames set for achieving them. Cologuard ordered today.  Patient encouraged to get Tdap vaccine shingles vaccine at her pharmacy

## 2022-02-06 NOTE — Assessment & Plan Note (Signed)
BP Readings from Last 3 Encounters:  02/06/22 130/84  10/25/21 (!) 142/84  09/12/21 (!) 148/82  Chronic condition well-controlled on lisinopril 5 mg daily, amlodipine 10 mg daily Continue current medications DASH diet advised engage in regular walking exercises at least for 150 minutes weekly

## 2022-02-06 NOTE — Patient Instructions (Addendum)
Please get your TDAP and shingles vaccine at your pharmacy.  Congratulations on your smoking cessation. Please continue to abstain from smoking   It is important that you exercise regularly at least 30 minutes 5 times a week.  Think about what you will eat, plan ahead. Choose " clean, green, fresh or frozen" over canned, processed or packaged foods which are more sugary, salty and fatty. 70 to 75% of food eaten should be vegetables and fruit. Three meals at set times with snacks allowed between meals, but they must be fruit or vegetables. Aim to eat over a 12 hour period , example 7 am to 7 pm, and STOP after  your last meal of the day. Drink water,generally about 64 ounces per day, no other drink is as healthy. Fruit juice is best enjoyed in a healthy way, by EATING the fruit.  Thanks for choosing [facility], we consider it a privelige to serve you.

## 2022-02-07 ENCOUNTER — Other Ambulatory Visit: Payer: Self-pay | Admitting: Nurse Practitioner

## 2022-02-07 DIAGNOSIS — E559 Vitamin D deficiency, unspecified: Secondary | ICD-10-CM

## 2022-02-07 DIAGNOSIS — E782 Mixed hyperlipidemia: Secondary | ICD-10-CM

## 2022-02-07 DIAGNOSIS — Z1211 Encounter for screening for malignant neoplasm of colon: Secondary | ICD-10-CM

## 2022-02-07 LAB — TSH: TSH: 3.63 u[IU]/mL (ref 0.450–4.500)

## 2022-02-07 LAB — CBC WITH DIFFERENTIAL/PLATELET
Basophils Absolute: 0 10*3/uL (ref 0.0–0.2)
Basos: 1 %
EOS (ABSOLUTE): 0.1 10*3/uL (ref 0.0–0.4)
Eos: 2 %
Hematocrit: 40.9 % (ref 34.0–46.6)
Hemoglobin: 13.8 g/dL (ref 11.1–15.9)
Immature Grans (Abs): 0 10*3/uL (ref 0.0–0.1)
Immature Granulocytes: 0 %
Lymphocytes Absolute: 1.2 10*3/uL (ref 0.7–3.1)
Lymphs: 23 %
MCH: 31.1 pg (ref 26.6–33.0)
MCHC: 33.7 g/dL (ref 31.5–35.7)
MCV: 92 fL (ref 79–97)
Monocytes Absolute: 0.5 10*3/uL (ref 0.1–0.9)
Monocytes: 9 %
Neutrophils Absolute: 3.6 10*3/uL (ref 1.4–7.0)
Neutrophils: 65 %
Platelets: 320 10*3/uL (ref 150–450)
RBC: 4.44 x10E6/uL (ref 3.77–5.28)
RDW: 12.8 % (ref 11.7–15.4)
WBC: 5.4 10*3/uL (ref 3.4–10.8)

## 2022-02-07 LAB — LIPID PANEL
Chol/HDL Ratio: 4.6 ratio — ABNORMAL HIGH (ref 0.0–4.4)
Cholesterol, Total: 235 mg/dL — ABNORMAL HIGH (ref 100–199)
HDL: 51 mg/dL (ref >39)
LDL Chol Calc (NIH): 144 mg/dL — ABNORMAL HIGH (ref 0–99)
Triglycerides: 224 mg/dL — ABNORMAL HIGH (ref 0–149)
VLDL Cholesterol Cal: 40 mg/dL (ref 5–40)

## 2022-02-07 LAB — VITAMIN D 25 HYDROXY (VIT D DEFICIENCY, FRACTURES): Vit D, 25-Hydroxy: 19.2 ng/mL — ABNORMAL LOW (ref 30.0–100.0)

## 2022-02-07 LAB — HEPATITIS C ANTIBODY: Hep C Virus Ab: NONREACTIVE

## 2022-02-07 MED ORDER — VITAMIN D3 25 MCG (1000 UT) PO CAPS: 1000.0000 [IU] | ORAL_CAPSULE | Freq: Every day | ORAL | 3 refills | Status: AC

## 2022-02-07 MED ORDER — ATORVASTATIN CALCIUM 40 MG PO TABS: 40.0000 mg | ORAL_TABLET | Freq: Every day | ORAL | 3 refills | Status: DC

## 2022-02-07 NOTE — Progress Notes (Signed)
  LDL is not at goal of less than 100, patient should start taking atorvastatin 40 mg daily continue other current medications avoid fatty fried foods.  LDL actually went up from 1 14-1 44.  Please schedule an appointment in 8 weeks to recheck labs.  Patient should have fasting labs done 3 to 5 days before her appointment in 8 weeks.  Triglyceride is much improved.    Vitamin D level is low.  Patient should start taking vitamin D 1000 units daily.  Patient should get a medication over-the-counter.   The labs are normal     The 10-year ASCVD risk score (Arnett DK, et al., 2019) is: 8.6%   Values used to calculate the score:     Age: 61 years     Sex: Female     Is Non-Hispanic African American: Yes     Diabetic: No     Tobacco smoker: No     Systolic Blood Pressure: 130 mmHg     Is BP treated: Yes     HDL Cholesterol: 51 mg/dL     Total Cholesterol: 235 mg/dL

## 2022-03-21 DIAGNOSIS — E782 Mixed hyperlipidemia: Secondary | ICD-10-CM | POA: Diagnosis not present

## 2022-03-22 ENCOUNTER — Other Ambulatory Visit: Payer: Self-pay | Admitting: Nurse Practitioner

## 2022-03-22 LAB — LIPID PANEL
Chol/HDL Ratio: 4.7 ratio — ABNORMAL HIGH (ref 0.0–4.4)
Cholesterol, Total: 186 mg/dL (ref 100–199)
HDL: 40 mg/dL (ref >39)
LDL Chol Calc (NIH): 114 mg/dL — ABNORMAL HIGH (ref 0–99)
Triglycerides: 184 mg/dL — ABNORMAL HIGH (ref 0–149)
VLDL Cholesterol Cal: 32 mg/dL (ref 5–40)

## 2022-03-22 NOTE — Progress Notes (Signed)
Will review results with patient at her upcoming appointment

## 2022-03-28 ENCOUNTER — Encounter: Payer: Self-pay | Admitting: Nurse Practitioner

## 2022-03-28 ENCOUNTER — Ambulatory Visit (INDEPENDENT_AMBULATORY_CARE_PROVIDER_SITE_OTHER): Payer: Medicare HMO | Admitting: Nurse Practitioner

## 2022-03-28 VITALS — BP 131/77 | HR 63 | Ht 62.0 in | Wt 201.0 lb

## 2022-03-28 DIAGNOSIS — L600 Ingrowing nail: Secondary | ICD-10-CM | POA: Diagnosis not present

## 2022-03-28 DIAGNOSIS — E7849 Other hyperlipidemia: Secondary | ICD-10-CM

## 2022-03-28 DIAGNOSIS — I10 Essential (primary) hypertension: Secondary | ICD-10-CM | POA: Diagnosis not present

## 2022-03-28 MED ORDER — ATORVASTATIN CALCIUM 80 MG PO TABS: 80.0000 mg | ORAL_TABLET | Freq: Every day | ORAL | 0 refills | Status: DC

## 2022-03-28 NOTE — Assessment & Plan Note (Signed)
BP Readings from Last 3 Encounters:  03/28/22 131/77  02/06/22 130/84  10/25/21 (!) 142/84  Chronic condition well-controlled On amlodipine 10 mg daily lisinopril 5 mg daily Continue current medications DASHDiet advised engage in regular moderate exercises at least 150 minutes weekly

## 2022-03-28 NOTE — Patient Instructions (Signed)
Please start taking atorvastatin 80 mg daily  Get your TDAP and shingles vaccine at the pharmacy   It is important that you exercise regularly at least 30 minutes 5 times a week.  Think about what you will eat, plan ahead. Choose " clean, green, fresh or frozen" over canned, processed or packaged foods which are more sugary, salty and fatty. 70 to 75% of food eaten should be vegetables and fruit. Three meals at set times with snacks allowed between meals, but they must be fruit or vegetables. Aim to eat over a 12 hour period , example 7 am to 7 pm, and STOP after  your last meal of the day. Drink water,generally about 64 ounces per day, no other drink is as healthy. Fruit juice is best enjoyed in a healthy way, by EATING the fruit.  Thanks for choosing [facility], we consider it a privelige to serve you.

## 2022-03-28 NOTE — Assessment & Plan Note (Signed)
Lab Results  Component Value Date   CHOL 186 03/21/2022   HDL 40 03/21/2022   LDLCALC 114 (H) 03/21/2022   TRIG 184 (H) 03/21/2022   CHOLHDL 4.7 (H) 03/21/2022  LDL much improved but not at goal of less than 100 Start atorvastatin 80 mg daily Continue lovastatin and fenofibrate Fasting lipid in 2 months Avoid fatty fried foods

## 2022-03-28 NOTE — Assessment & Plan Note (Signed)
Referral sent to podiatry today

## 2022-03-28 NOTE — Progress Notes (Signed)
   [name]     MRN: [id number]      DOB: 1961/07/06   HPI [name] with past medical history of hyperlipidemia, hypertension, obesity, GERD is here for follow up for hyperlipidemia  Hyperlipidemia.  Currently on atorvastatin 40 mg daily, fenofibrate 48 mg daily, Lovaza 2 g twice daily.  Patient denies muscle aches  ingrown left big toe nail   Needs referral to podiatrist, nail hurts when she wears closed toe foot wear.  Due for shingles and Tdap vaccine need for both vaccines discussed patient encouraged to get the vaccine at her pharmacy     ROS Denies recent fever or chills. Denies sinus pressure, nasal congestion, ear pain or sore throat. Denies chest congestion, productive cough or wheezing. Denies chest pains, palpitations and leg swelling Denies abdominal pain, nausea, vomiting,diarrhea or constipation.   Denies dysuria, frequency, hesitancy or incontinence. Denies headaches, seizures, numbness, or tingling. Denies depression, anxiety or insomnia.    PE  BP 131/77 (BP Location: Left Arm, Patient Position: Sitting, Cuff Size: Large)   Pulse 63   Ht 5\' 2"  (1.575 m)   Wt 201 lb (91.2 kg)   SpO2 100%   BMI 36.76 kg/m   Patient alert and oriented and in no cardiopulmonary distress.  Chest: Clear to auscultation bilaterally.  CVS: S1, S2 no murmurs, no S3.Regular rate.  ABD: Soft non tender.   Ext: No edema  MS: Adequate ROM spine, shoulders, hips and knees, ingrown toe left great toe, nail polish on.   Psych: Good eye contact, normal affect. Memory intact not anxious or depressed appearing.  CNS: CN 2-12 intact, power,  normal throughout.no focal deficits noted.  Assessment & Plan High blood pressure BP Readings from Last 3 Encounters:  03/28/22 131/77  02/06/22 130/84  10/25/21 (!) 142/84  Chronic condition well-controlled On amlodipine 10 mg daily lisinopril 5 mg daily Continue current medications DASHDiet advised engage in regular moderate  exercises at least 150 minutes weekly  Ingrown left big toenail Referral sent to podiatry today  Other hyperlipidemia Lab Results  Component Value Date   CHOL 186 03/21/2022   HDL 40 03/21/2022   LDLCALC 114 (H) 03/21/2022   TRIG 184 (H) 03/21/2022   CHOLHDL 4.7 (H) 03/21/2022  LDL much improved but not at goal of less than 100 Start atorvastatin 80 mg daily Continue lovastatin and fenofibrate Fasting lipid in 2 months Avoid fatty fried foods

## 2022-04-04 ENCOUNTER — Ambulatory Visit: Payer: Medicare HMO | Admitting: Nurse Practitioner

## 2022-04-08 ENCOUNTER — Other Ambulatory Visit: Payer: Self-pay | Admitting: Nurse Practitioner

## 2022-04-08 DIAGNOSIS — E7849 Other hyperlipidemia: Secondary | ICD-10-CM

## 2022-05-16 DIAGNOSIS — L603 Nail dystrophy: Secondary | ICD-10-CM | POA: Diagnosis not present

## 2022-05-16 DIAGNOSIS — M79675 Pain in left toe(s): Secondary | ICD-10-CM | POA: Diagnosis not present

## 2022-05-16 DIAGNOSIS — B351 Tinea unguium: Secondary | ICD-10-CM | POA: Diagnosis not present

## 2022-05-16 DIAGNOSIS — L601 Onycholysis: Secondary | ICD-10-CM | POA: Diagnosis not present

## 2022-05-16 DIAGNOSIS — L600 Ingrowing nail: Secondary | ICD-10-CM | POA: Diagnosis not present

## 2022-05-29 ENCOUNTER — Encounter: Payer: Self-pay | Admitting: Family Medicine

## 2022-05-29 ENCOUNTER — Ambulatory Visit (INDEPENDENT_AMBULATORY_CARE_PROVIDER_SITE_OTHER): Payer: Medicare HMO | Admitting: Family Medicine

## 2022-05-29 DIAGNOSIS — J0120 Acute ethmoidal sinusitis, unspecified: Secondary | ICD-10-CM | POA: Diagnosis not present

## 2022-05-29 DIAGNOSIS — E7849 Other hyperlipidemia: Secondary | ICD-10-CM | POA: Diagnosis not present

## 2022-05-29 DIAGNOSIS — I10 Essential (primary) hypertension: Secondary | ICD-10-CM

## 2022-05-29 DIAGNOSIS — Z1211 Encounter for screening for malignant neoplasm of colon: Secondary | ICD-10-CM

## 2022-05-29 DIAGNOSIS — Z23 Encounter for immunization: Secondary | ICD-10-CM | POA: Insufficient documentation

## 2022-05-29 DIAGNOSIS — E559 Vitamin D deficiency, unspecified: Secondary | ICD-10-CM | POA: Diagnosis not present

## 2022-05-29 DIAGNOSIS — R7301 Impaired fasting glucose: Secondary | ICD-10-CM | POA: Diagnosis not present

## 2022-05-29 MED ORDER — LISINOPRIL 5 MG PO TABS: 5.0000 mg | ORAL_TABLET | Freq: Every day | ORAL | 3 refills | Status: DC

## 2022-05-29 NOTE — Assessment & Plan Note (Signed)
Will get basic labs today to assess her cholesterol levels Encouraged to continue taking atorvastatin 80 mg daily, Lovaza 2 g capsule twice daily and fenofibrate 48 mg daily No changes to treatment regimen today

## 2022-05-29 NOTE — Assessment & Plan Note (Signed)
She requested a refill of lisinopril 5 mg Refill sent to her pharmacy

## 2022-05-29 NOTE — Assessment & Plan Note (Signed)
Patient educated on CDC recommendation for the vaccine. Verbal consent was obtained from the patient, vaccine administered by nurse, no sign of adverse reactions noted at this time. Patient education on arm soreness and use of tylenol or ibuprofen for this patient  was discussed. Patient educated on the signs and symptoms of adverse effect and advise to contact the office if they occur.  

## 2022-05-29 NOTE — Progress Notes (Signed)
Established Patient Office Visit  Subjective:  Patient ID: [name], female    DOB: September 18, 1960  Age: 61 y.o. MRN: [id number]  CC:  Chief Complaint  Patient presents with   Follow-up    2 month f/u, never received cologuard kit would like to do a fecal card instead.     HPI [name] is a 61 y.o. female with past medical history of hypertension, hyperlipidemia, and obesity presents for f/u of  chronic medical conditions.  Hyperlipidemia: She takes atorvastatin 80 mg daily, Lovaza 2 g capsule twice daily and fenofibrate 48 mg daily.  She reports compliance with treatment regimen.  She denies muscle aches, chest pain, palpitations, and shortness of breath.  Past Medical History:  Diagnosis Date   Anemia    Anxiety    Arthritis    back, hands , knees    Chest pain 2010   due to sarcoidosis- per pt.    Chronic pain    Depression    GERD (gastroesophageal reflux disease)    Hypercholesterolemia    Hypertension    Pneumonia    sarcoidosis- in remission    Pulmonary sarcoidosis ([place])    Treatment with Prednisone    Past Surgical History:  Procedure Laterality Date   ABDOMINAL HYSTERECTOMY     APPENDECTOMY     CHOLECYSTECTOMY     EXCISION MASS ABDOMINAL N/A 03/20/2020   Procedure: Excision of anterior abdominal wall fat necrosis;  Surgeon: Cindra Presume, MD;  Location: [place];  Service: Plastics;  Laterality: N/A;   HERNIA REPAIR     INCISIONAL HERNIA REPAIR N/A 07/01/2019   Procedure: OPEN INCISIONAL HERNIA REPAIR WITH MESH;  Surgeon: Kinsinger, Arta Bruce, MD;  Location: [place];  Service: General;  Laterality: N/A;   [place] N/A 02/20/2021   Procedure: LAPAROSCOPIC RECURRENT INCISIONAL HERNIA REPAIR WITH MESH;  Surgeon: Kieth Brightly Arta Bruce, MD;  Location: [facility];  Service: General;  Laterality: N/A;   INSERTION OF MESH N/A 07/01/2019   Procedure: Insertion Of Mesh;  Surgeon: Kinsinger, Arta Bruce, MD;  Location: [place];   Service: General;  Laterality: N/A;   LAPAROSCOPIC GASTRIC SLEEVE RESECTION N/A 02/15/2019   Procedure: LAPAROSCOPIC GASTRIC SLEEVE RESECTION, UPPER ENDO, ERAS Pathway, Hiatal Hernia Repair;  Surgeon: Kinsinger, Arta Bruce, MD;  Location: [facility];  Service: General;  Laterality: N/A;   LAPAROSCOPY N/A 02/17/2019   Procedure: LAPAROSCOPY DIAGNOSTIC CONVERTED TO OPEN, EVACUATION OF HEMATOMA AND LIGATION OF VESSEL;  Surgeon: Kinsinger, Arta Bruce, MD;  Location: [facility];  Service: General;  Laterality: N/A;   LAPAROSCOPY N/A 07/01/2019   Procedure: Laparoscopy Diagnostic;  Surgeon: Kieth Brightly, Arta Bruce, MD;  Location: [place];  Service: General;  Laterality: N/A;   SMALL INTESTINE SURGERY      Family History  Problem Relation Age of Onset   Hypertension Mother    Hyperlipidemia Mother    Lupus Father    Brain cancer Sister        2020   Heart disease Brother    Heart attack Brother    Kidney disease Maternal Grandmother    Lung cancer Neg Hx     Social History   Socioeconomic History   Marital status: Divorced    Spouse name: Not on file   Number of children: 2   Years of education: 12th   Highest education level: Some college, no degree  Occupational History   Occupation: foster parent  Tobacco Use   Smoking status: Former  Packs/day: 0.20    Years: 30.00    Total pack years: 6.00    Types: Cigarettes    Quit date: 11/2021    Years since quitting: 0.5   Smokeless tobacco: Never   Tobacco comments:    Quit smoking 04/23  Vaping Use   Vaping Use: Never used  Substance and Sexual Activity   Alcohol use: Not Currently    Comment: some   Drug use: No   Sexual activity: Yes    Birth control/protection: Surgical  Other Topics Concern   Not on file  Social History Narrative   does regular exercise. Three times a week   Social Determinants of Health   Financial Resource Strain: Medium Risk (08/14/2021)   Overall Financial Resource Strain (CARDIA)    Difficulty of Paying  Living Expenses: Somewhat hard  Food Insecurity: No Food Insecurity (08/14/2021)   Hunger Vital Sign    Worried About Running Out of Food in the Last Year: Never true    Ran Out of Food in the Last Year: Never true  Transportation Needs: No Transportation Needs (08/14/2021)   PRAPARE - Hydrologist (Medical): No    Lack of Transportation (Non-Medical): No  Physical Activity: Sufficiently Active (08/14/2021)   Exercise Vital Sign    Days of Exercise per Week: 3 days    Minutes of Exercise per Session: 50 min  Stress: Stress Concern Present (08/14/2021)   [place]    Feeling of Stress : Rather much  Social Connections: Moderately Isolated (08/14/2021)   Social Connection and Isolation Panel [NHANES]    Frequency of Communication with Friends and Family: More than three times a week    Frequency of Social Gatherings with Friends and Family: Once a week    Attends Religious Services: 1 to 4 times per year    Active Member of [employer] or Organizations: No    Attends Archivist Meetings: Never    Marital Status: Divorced  Human resources officer Violence: Not At Risk (08/14/2021)   Humiliation, Afraid, Rape, and Kick questionnaire    Fear of Current or Ex-Partner: No    Emotionally Abused: No    Physically Abused: No    Sexually Abused: No    Outpatient Medications Prior to Visit  Medication Sig Dispense Refill   amLODipine (NORVASC) 10 MG tablet Take 1 tablet (10 mg total) by mouth daily. 90 tablet 3   atorvastatin (LIPITOR) 80 MG tablet Take 1 tablet (80 mg total) by mouth daily. 90 tablet 0   Blood Pressure Monitoring (BLOOD PRESSURE KIT) KIT Check your blood pressure 3 x a week 1 kit 0   Cholecalciferol (VITAMIN D3) 25 MCG (1000 UT) CAPS Take 1 capsule (1,000 Units total) by mouth daily. 30 capsule 3   Elastic Bandages & Supports (ABDOMINAL BINDER/ELASTIC LARGE) MISC 1 each by Does not apply route  daily. 1 each 0   fenofibrate (TRICOR) 48 MG tablet TAKE ONE TABLET BY MOUTH ONCE DAILY. 90 tablet 0   fluticasone (FLONASE) 50 MCG/ACT nasal spray Place 2 sprays into both nostrils daily. 16 g 6   ibuprofen (ADVIL) 800 MG tablet Take 1 tablet (800 mg total) by mouth every 8 (eight) hours as needed. 30 tablet 0   Multiple Vitamins-Minerals (WOMENS 50+ MULTI VITAMIN/MIN PO) Take 1 tablet by mouth daily.     RESTASIS 0.05 % ophthalmic emulsion 1 drop 2 (two) times daily.     lisinopril (  ZESTRIL) 5 MG tablet Take 1 tablet (5 mg total) by mouth daily. 90 tablet 3   omega-3 acid ethyl esters (LOVAZA) 1 g capsule Take 2 capsules (2 g total) by mouth 2 (two) times daily. 360 capsule 0   No facility-administered medications prior to visit.    Allergies  Allergen Reactions   Aspirin Hives    ROS Review of Systems  Eyes:  Negative for photophobia and visual disturbance.  Respiratory:  Negative for chest tightness and shortness of breath.   Cardiovascular:  Negative for chest pain and palpitations.  Musculoskeletal:  Negative for myalgias.  Neurological:  Negative for dizziness and headaches.      Objective:    Physical Exam HENT:     Head: Normocephalic.     Right Ear: External ear normal.     Left Ear: External ear normal.  Cardiovascular:     Rate and Rhythm: Normal rate and regular rhythm.     Pulses: Normal pulses.     Heart sounds: Normal heart sounds.  Pulmonary:     Effort: Pulmonary effort is normal.     Breath sounds: Normal breath sounds.  Neurological:     Mental Status: She is alert.     There were no vitals taken for this visit. Wt Readings from Last 3 Encounters:  03/28/22 201 lb (91.2 kg)  02/06/22 200 lb (90.7 kg)  10/25/21 198 lb (89.8 kg)    Lab Results  Component Value Date   TSH 3.630 02/06/2022   Lab Results  Component Value Date   WBC 5.4 02/06/2022   HGB 13.8 02/06/2022   HCT 40.9 02/06/2022   MCV 92 02/06/2022   PLT 320 02/06/2022   Lab  Results  Component Value Date   NA 141 12/18/2021   K 3.6 12/18/2021   CO2 23 12/18/2021   GLUCOSE 85 12/18/2021   BUN 5 (L) 12/18/2021   CREATININE 0.61 12/18/2021   BILITOT 0.6 10/03/2021   ALKPHOS 114 10/03/2021   AST 45 (H) 10/03/2021   ALT 20 10/03/2021   PROT 7.7 10/03/2021   ALBUMIN 4.0 10/03/2021   CALCIUM 9.8 12/18/2021   ANIONGAP 6 02/21/2021   EGFR 102 12/18/2021   Lab Results  Component Value Date   CHOL 186 03/21/2022   Lab Results  Component Value Date   HDL 40 03/21/2022   Lab Results  Component Value Date   LDLCALC 114 (H) 03/21/2022   Lab Results  Component Value Date   TRIG 184 (H) 03/21/2022   Lab Results  Component Value Date   CHOLHDL 4.7 (H) 03/21/2022   Lab Results  Component Value Date   HGBA1C 5.5 09/12/2021      Assessment & Plan:   Problem List Items Addressed This Visit       Cardiovascular and Mediastinum   High blood pressure    She requested a refill of lisinopril 5 mg Refill sent to her pharmacy      Relevant Medications   lisinopril (ZESTRIL) 5 MG tablet     Respiratory   Acute ethmoidal sinusitis     Other   Other hyperlipidemia    Will get basic labs today to assess her cholesterol levels Encouraged to continue taking atorvastatin 80 mg daily, Lovaza 2 g capsule twice daily and fenofibrate 48 mg daily No changes to treatment regimen today      Relevant Medications   lisinopril (ZESTRIL) 5 MG tablet   Other Relevant Orders   CBC with Differential/Platelet  CMP14+EGFR   TSH + free T4   Lipid Profile   Vitamin D deficiency   Relevant Orders   Vitamin D (25 hydroxy)   Need for immunization against influenza    Patient educated on CDC recommendation for the vaccine. Verbal consent was obtained from the patient, vaccine administered by nurse, no sign of adverse reactions noted at this time. Patient education on arm soreness and use of tylenol or ibuprofen for this patient  was discussed. Patient educated on  the signs and symptoms of adverse effect and advise to contact the office if they occur.      Other Visit Diagnoses     Flu vaccine need    -  Primary   Relevant Orders   Flu Vaccine QUAD 6+ mos PF IM (Fluarix Quad PF)   IFG (impaired fasting glucose)       Relevant Orders   Hemoglobin A1C       Meds ordered this encounter  Medications   lisinopril (ZESTRIL) 5 MG tablet    Sig: Take 1 tablet (5 mg total) by mouth daily.    Dispense:  90 tablet    Refill:  3    Follow-up: Return in about 3 months (around 08/29/2022).    Alvira Monday, FNP

## 2022-05-29 NOTE — Patient Instructions (Addendum)
I appreciate the opportunity to provide care to you today!    Follow up:  3 months  Labs: please stop by the lab today to get your blood drawn (CBC, CMP, TSH, Lipid profile, HgA1c, Vit D)  You have 6 refills on your Flonase, you can request a refill when you go to your pharmacy.  Please pick up your lisinopril at your pharmacy, you have 3 refills on your lisinopril.   Please continue to a heart-healthy diet and increase your physical activities. Try to exercise for 20mins at least three times a week.      It was a pleasure to see you and I look forward to continuing to work together on your health and well-being. Please do not hesitate to call the office if you need care or have questions about your care.   Have a wonderful day and week. With Gratitude, Alvira Monday MSN, FNP-BC

## 2022-05-30 ENCOUNTER — Other Ambulatory Visit: Payer: Self-pay | Admitting: Family Medicine

## 2022-05-30 DIAGNOSIS — E7849 Other hyperlipidemia: Secondary | ICD-10-CM

## 2022-05-30 LAB — LIPID PANEL
Chol/HDL Ratio: 5 ratio — ABNORMAL HIGH (ref 0.0–4.4)
Cholesterol, Total: 198 mg/dL (ref 100–199)
HDL: 40 mg/dL (ref >39)
LDL Chol Calc (NIH): 120 mg/dL — ABNORMAL HIGH (ref 0–99)
Triglycerides: 218 mg/dL — ABNORMAL HIGH (ref 0–149)
VLDL Cholesterol Cal: 38 mg/dL (ref 5–40)

## 2022-05-30 LAB — CBC WITH DIFFERENTIAL/PLATELET
Basophils Absolute: 0 10*3/uL (ref 0.0–0.2)
Basos: 1 %
EOS (ABSOLUTE): 0.2 10*3/uL (ref 0.0–0.4)
Eos: 3 %
Hematocrit: 40.3 % (ref 34.0–46.6)
Hemoglobin: 13.5 g/dL (ref 11.1–15.9)
Immature Grans (Abs): 0 10*3/uL (ref 0.0–0.1)
Immature Granulocytes: 0 %
Lymphocytes Absolute: 1.2 10*3/uL (ref 0.7–3.1)
Lymphs: 20 %
MCH: 30.6 pg (ref 26.6–33.0)
MCHC: 33.5 g/dL (ref 31.5–35.7)
MCV: 91 fL (ref 79–97)
Monocytes Absolute: 0.5 10*3/uL (ref 0.1–0.9)
Monocytes: 9 %
Neutrophils Absolute: 3.9 10*3/uL (ref 1.4–7.0)
Neutrophils: 67 %
Platelets: 283 10*3/uL (ref 150–450)
RBC: 4.41 x10E6/uL (ref 3.77–5.28)
RDW: 13.2 % (ref 11.7–15.4)
WBC: 5.8 10*3/uL (ref 3.4–10.8)

## 2022-05-30 LAB — CMP14+EGFR
ALT: 14 IU/L (ref 0–32)
AST: 32 IU/L (ref 0–40)
Albumin/Globulin Ratio: 1.1 — ABNORMAL LOW (ref 1.2–2.2)
Albumin: 3.9 g/dL (ref 3.8–4.9)
Alkaline Phosphatase: 103 IU/L (ref 44–121)
BUN/Creatinine Ratio: 4 — ABNORMAL LOW (ref 12–28)
BUN: 3 mg/dL — ABNORMAL LOW (ref 8–27)
Bilirubin Total: 0.7 mg/dL (ref 0.0–1.2)
CO2: 25 mmol/L (ref 20–29)
Calcium: 10.1 mg/dL (ref 8.7–10.3)
Chloride: 102 mmol/L (ref 96–106)
Creatinine, Ser: 0.76 mg/dL (ref 0.57–1.00)
Globulin, Total: 3.7 g/dL (ref 1.5–4.5)
Glucose: 89 mg/dL (ref 70–99)
Potassium: 3.5 mmol/L (ref 3.5–5.2)
Sodium: 142 mmol/L (ref 134–144)
Total Protein: 7.6 g/dL (ref 6.0–8.5)
eGFR: 90 mL/min/{1.73_m2} (ref >59)

## 2022-05-30 LAB — TSH+FREE T4
Free T4: 1.08 ng/dL (ref 0.82–1.77)
TSH: 2.99 u[IU]/mL (ref 0.450–4.500)

## 2022-05-30 LAB — VITAMIN D 25 HYDROXY (VIT D DEFICIENCY, FRACTURES): Vit D, 25-Hydroxy: 29.5 ng/mL — ABNORMAL LOW (ref 30.0–100.0)

## 2022-05-30 LAB — HEMOGLOBIN A1C
Est. average glucose Bld gHb Est-mCnc: 108 mg/dL
Hgb A1c MFr Bld: 5.4 % (ref 4.8–5.6)

## 2022-05-30 MED ORDER — EZETIMIBE 10 MG PO TABS: 10.0000 mg | ORAL_TABLET | Freq: Every day | ORAL | 3 refills | Status: DC

## 2022-05-30 NOTE — Progress Notes (Signed)
The 10-year ASCVD risk score (Arnett DK, et al., 2019) is: 8.7%   Values used to calculate the score:     Age: 61 years     Sex: Female     Is Non-Hispanic African American: Yes     Diabetic: No     Tobacco smoker: No     Systolic Blood Pressure: 481 mmHg     Is BP treated: Yes     HDL Cholesterol: 40 mg/dL     Total Cholesterol: 198 mg/dL

## 2022-05-30 NOTE — Progress Notes (Signed)
Please inform the patient that her cholesterol is elevated and not at goal.  I want her LDL to less than 100.  I have added ezetimibe 10 mg daily to her treatment regimen.  Please encourage her to pick up the prescription and start therapy.  Her BUN is low, I recommend increasing her intake of high-protein foods.  I also recommend low carbs, low-fat, and increasing her physical activities daily.

## 2022-06-18 DIAGNOSIS — B351 Tinea unguium: Secondary | ICD-10-CM | POA: Diagnosis not present

## 2022-06-18 DIAGNOSIS — L600 Ingrowing nail: Secondary | ICD-10-CM | POA: Diagnosis not present

## 2022-06-19 ENCOUNTER — Other Ambulatory Visit: Payer: Self-pay | Admitting: Nurse Practitioner

## 2022-06-19 DIAGNOSIS — E7849 Other hyperlipidemia: Secondary | ICD-10-CM

## 2022-07-09 ENCOUNTER — Other Ambulatory Visit: Payer: Self-pay | Admitting: Nurse Practitioner

## 2022-07-09 DIAGNOSIS — J0120 Acute ethmoidal sinusitis, unspecified: Secondary | ICD-10-CM

## 2022-07-09 DIAGNOSIS — E7849 Other hyperlipidemia: Secondary | ICD-10-CM

## 2022-08-08 ENCOUNTER — Ambulatory Visit: Payer: Medicare HMO | Admitting: Family Medicine

## 2022-08-15 ENCOUNTER — Encounter: Payer: Medicare HMO | Admitting: Nurse Practitioner

## 2022-08-20 ENCOUNTER — Ambulatory Visit (INDEPENDENT_AMBULATORY_CARE_PROVIDER_SITE_OTHER): Payer: Medicare HMO | Admitting: Internal Medicine

## 2022-08-20 ENCOUNTER — Encounter: Payer: Self-pay | Admitting: Internal Medicine

## 2022-08-20 VITALS — BP 125/78 | HR 72 | Resp 16 | Ht 62.5 in | Wt 206.0 lb

## 2022-08-20 DIAGNOSIS — Z0000 Encounter for general adult medical examination without abnormal findings: Secondary | ICD-10-CM | POA: Diagnosis not present

## 2022-08-20 DIAGNOSIS — R059 Cough, unspecified: Secondary | ICD-10-CM | POA: Diagnosis not present

## 2022-08-20 NOTE — Patient Instructions (Addendum)
  Ms. Griffo , Thank you for taking time to come for your Medicare Wellness Visit. I appreciate your ongoing commitment to your health goals. Please review the following plan we discussed and let me know if I can assist you in the future.   These are the goals we discussed:  Cough - RSV, Covid, Flu test - Avoid others while having these symptoms  - Recommend continuing to treat symptoms with OTC medications  This is a list of the screening recommended for you and due dates:  Health Maintenance  Topic Date Due   DTaP/Tdap/Td vaccine (1 - Tdap) Never done   Zoster (Shingles) Vaccine (1 of 2) Never done   Colon Cancer Screening  Never done   Medicare Annual Wellness Visit  08/14/2022   Mammogram  01/03/2024   Flu Shot  Completed   Hepatitis C Screening: USPSTF Recommendation to screen - Ages 18-79 yo.  Completed   HIV Screening  Completed   HPV Vaccine  Aged Out   Pap Smear  Discontinued   COVID-19 Vaccine  Discontinued

## 2022-08-20 NOTE — Progress Notes (Signed)
Subjective:   [name] is a 62 y.o. female who presents for Medicare Annual (Subsequent) preventive examination.  In addition patient is having cough, itchy throat, and nasal congestion. These symptoms started on Wednesday. She has allergies and has been using Flonase. Also taking OTC cold and flu medicine. No fever or sick contacts.   Review of Systems    Review of Systems  Constitutional:  Negative for chills and fever.  HENT:  Positive for congestion, sinus pain and sore throat.   All other systems reviewed and are negative.         Objective:    Today's Vitals   08/20/22 0913  BP: 125/78  Pulse: 72  Resp: 16  Weight: 206 lb (93.4 kg)  Height: 5' 2.5" (1.588 m)   Body mass index is 37.08 kg/m.   Physical Exam Constitutional:      Appearance: She is well-developed and well-groomed.  HENT:     Nose:     Right Turbinates: Swollen.     Left Turbinates: Swollen.     Right Sinus: Maxillary sinus tenderness present. No frontal sinus tenderness.     Left Sinus: Maxillary sinus tenderness present. No frontal sinus tenderness.  Eyes:     General: No scleral icterus.    Conjunctiva/sclera: Conjunctivae normal.  Cardiovascular:     Rate and Rhythm: Normal rate and regular rhythm.     Heart sounds: No murmur heard.    No friction rub. No gallop.  Pulmonary:     Effort: Pulmonary effort is normal.     Breath sounds: No wheezing, rhonchi or rales.  Musculoskeletal:     Right lower leg: No edema.     Left lower leg: No edema.  Skin:    General: Skin is warm and dry.         08/20/2022    9:20 AM 08/14/2021    8:20 AM 02/22/2021    3:00 PM 02/13/2021    1:42 PM 12/11/2020    2:39 PM 03/20/2020    7:38 AM 03/13/2020   12:21 PM  Advanced Directives  Does Patient Have a Medical Advance Directive? No No No No No No No  Would patient like information on creating a medical advance directive? Yes (Inpatient - patient defers creating a medical advance directive at this  time - Information given) Yes (ED - Information included in AVS) No - Patient declined  No - Patient declined No - Patient declined No - Patient declined    Current Medications (verified) Outpatient Encounter Medications as of 08/20/2022  Medication Sig   amLODipine (NORVASC) 10 MG tablet Take 1 tablet (10 mg total) by mouth daily.   atorvastatin (LIPITOR) 80 MG tablet TAKE ONE TABLET BY MOUTH ONCE DAILY.   Blood Pressure Monitoring (BLOOD PRESSURE KIT) KIT Check your blood pressure 3 x a week   Cholecalciferol (VITAMIN D3) 25 MCG (1000 UT) CAPS Take 1 capsule (1,000 Units total) by mouth daily.   Elastic Bandages & Supports (ABDOMINAL BINDER/ELASTIC LARGE) MISC 1 each by Does not apply route daily.   ezetimibe (ZETIA) 10 MG tablet Take 1 tablet (10 mg total) by mouth daily.   fenofibrate (TRICOR) 48 MG tablet TAKE ONE TABLET BY MOUTH ONCE DAILY.   fluticasone (FLONASE) 50 MCG/ACT nasal spray PLACE 2 SPRAYS INTO BOTH NOSTRILS DAILY.   ibuprofen (ADVIL) 800 MG tablet Take 1 tablet (800 mg total) by mouth every 8 (eight) hours as needed.   lisinopril (ZESTRIL) 5 MG tablet Take 1  tablet (5 mg total) by mouth daily.   Multiple Vitamins-Minerals (WOMENS 50+ MULTI VITAMIN/MIN PO) Take 1 tablet by mouth daily.   RESTASIS 0.05 % ophthalmic emulsion 1 drop 2 (two) times daily.   omega-3 acid ethyl esters (LOVAZA) 1 g capsule Take 2 capsules (2 g total) by mouth 2 (two) times daily.   No facility-administered encounter medications on file as of 08/20/2022.    Allergies (verified) Aspirin   History: Past Medical History:  Diagnosis Date   Anemia    Anxiety    Arthritis    back, hands , knees    Chest pain 2010   due to sarcoidosis- per pt.    Chronic pain    Depression    GERD (gastroesophageal reflux disease)    Hypercholesterolemia    Hypertension    Pneumonia    sarcoidosis- in remission    Pulmonary sarcoidosis ([place])    Treatment with Prednisone   Past Surgical History:  Procedure  Laterality Date   ABDOMINAL HYSTERECTOMY     APPENDECTOMY     CHOLECYSTECTOMY     EXCISION MASS ABDOMINAL N/A 03/20/2020   Procedure: Excision of anterior abdominal wall fat necrosis;  Surgeon: Cindra Presume, MD;  Location: [facility];  Service: Plastics;  Laterality: N/A;   HERNIA REPAIR     INCISIONAL HERNIA REPAIR N/A 07/01/2019   Procedure: OPEN INCISIONAL HERNIA REPAIR WITH MESH;  Surgeon: Kinsinger, Arta Bruce, MD;  Location: [facility];  Service: General;  Laterality: N/A;   [place] N/A 02/20/2021   Procedure: LAPAROSCOPIC RECURRENT INCISIONAL HERNIA REPAIR WITH MESH;  Surgeon: Kieth Brightly Arta Bruce, MD;  Location: [facility];  Service: General;  Laterality: N/A;   INSERTION OF MESH N/A 07/01/2019   Procedure: Insertion Of Mesh;  Surgeon: Kinsinger, Arta Bruce, MD;  Location: [facility];  Service: General;  Laterality: N/A;   LAPAROSCOPIC GASTRIC SLEEVE RESECTION N/A 02/15/2019   Procedure: LAPAROSCOPIC GASTRIC SLEEVE RESECTION, UPPER ENDO, ERAS Pathway, Hiatal Hernia Repair;  Surgeon: Kinsinger, Arta Bruce, MD;  Location: [facility];  Service: General;  Laterality: N/A;   LAPAROSCOPY N/A 02/17/2019   Procedure: LAPAROSCOPY DIAGNOSTIC CONVERTED TO OPEN, EVACUATION OF HEMATOMA AND LIGATION OF VESSEL;  Surgeon: Kinsinger, Arta Bruce, MD;  Location: [facility];  Service: General;  Laterality: N/A;   LAPAROSCOPY N/A 07/01/2019   Procedure: Laparoscopy Diagnostic;  Surgeon: Kieth Brightly, Arta Bruce, MD;  Location: [facility];  Service: General;  Laterality: N/A;   SMALL INTESTINE SURGERY     Family History  Problem Relation Age of Onset   Hypertension Mother    Hyperlipidemia Mother    Lupus Father    Brain cancer Sister        2020   Heart disease Brother    Heart attack Brother    Kidney disease Maternal Grandmother    Lung cancer Neg Hx    Social History   Socioeconomic History   Marital status: Divorced    Spouse name: Not on file   Number of children: 2   Years of  education: 12th   Highest education level: Some college, no degree  Occupational History   Occupation: foster parent  Tobacco Use   Smoking status: Former    Packs/day: 0.20    Years: 30.00    Total pack years: 6.00    Types: Cigarettes    Quit date: 11/2021    Years since quitting: 0.7   Smokeless tobacco: Never   Tobacco comments:    Quit smoking  04/23  Vaping Use   Vaping Use: Never used  Substance and Sexual Activity   Alcohol use: Not Currently    Comment: some   Drug use: No   Sexual activity: Yes    Birth control/protection: Surgical  Other Topics Concern   Not on file  Social History Narrative   does regular exercise. Three times a week   Social Determinants of Health   Financial Resource Strain: Medium Risk (08/14/2021)   Overall Financial Resource Strain (CARDIA)    Difficulty of Paying Living Expenses: Somewhat hard  Food Insecurity: No Food Insecurity (08/14/2021)   Hunger Vital Sign    Worried About Running Out of Food in the Last Year: Never true    Ran Out of Food in the Last Year: Never true  Transportation Needs: No Transportation Needs (08/14/2021)   PRAPARE - Hydrologist (Medical): No    Lack of Transportation (Non-Medical): No  Physical Activity: Sufficiently Active (08/14/2021)   Exercise Vital Sign    Days of Exercise per Week: 3 days    Minutes of Exercise per Session: 50 min  Stress: Stress Concern Present (08/14/2021)   [place]    Feeling of Stress : Rather much  Social Connections: Moderately Isolated (08/14/2021)   Social Connection and Isolation Panel [NHANES]    Frequency of Communication with Friends and Family: More than three times a week    Frequency of Social Gatherings with Friends and Family: Once a week    Attends Religious Services: 1 to 4 times per year    Active Member of [employer] or Organizations: No    Attends Archivist Meetings:  Never    Marital Status: Divorced    Tobacco Counseling Counseling given: Not Answered Tobacco comments: Quit smoking 04/23   Clinical Intake:  Pre-visit preparation completed: Yes  Pain : No/denies pain     Diabetes: No  How often do you need to have someone help you when you read instructions, pamphlets, or other written materials from your doctor or pharmacy?: 1 - Never What is the last grade level you completed in school?: 12th plus associate  Diabetic?NO   Activities of Daily Living    08/20/2022    9:22 AM  In your present state of health, do you have any difficulty performing the following activities:  Hearing? 0  Vision? 0  Difficulty concentrating or making decisions? 0  Walking or climbing stairs? 0  Doing errands, shopping? 0    Patient Care Team: Renee Rival, FNP as PCP - General (Nurse Practitioner) Daneil Dolin, MD (Gastroenterology)  Indicate any recent Medical Services you may have received from other than [facility] providers in the past year (date may be approximate).     Assessment:   This is a routine wellness examination for [place].  Hearing/Vision screen No results found.  Dietary issues and exercise activities discussed:     Goals Addressed   None    Depression Screen    08/20/2022    9:22 AM 05/29/2022    9:05 AM 03/28/2022    8:31 AM 02/06/2022    9:16 AM 10/25/2021    8:50 AM 09/12/2021    8:08 AM 08/14/2021    8:12 AM  PHQ 2/9 Scores  PHQ - 2 Score 0 0 0 0 0 0 0    Fall Risk    08/20/2022    9:22 AM 05/29/2022    9:04  AM 03/28/2022    8:31 AM 02/06/2022    9:16 AM 10/25/2021    8:50 AM  Fall Risk   Falls in the past year? 0 0 0 0 0  Number falls in past yr: 0 0 0 0 0  Injury with Fall? 0 0 0 0 0  Risk for fall due to :  No Fall Risks No Fall Risks No Fall Risks No Fall Risks  Follow up  Falls evaluation completed Falls evaluation completed Falls evaluation completed Falls evaluation completed    FALL RISK  PREVENTION PERTAINING TO THE HOME:  Any stairs in or around the home? Yes  If so, are there any without handrails? Yes  Home free of loose throw rugs in walkways, pet beds, electrical cords, etc? Yes  Adequate lighting in your home to reduce risk of falls? Yes   ASSISTIVE DEVICES UTILIZED TO PREVENT FALLS:  Life alert? No  Use of a cane, walker or w/c? No  Grab bars in the bathroom? No  Shower chair or bench in shower? No  Elevated toilet seat or a handicapped toilet? No    Cognitive Function:        08/20/2022    9:23 AM 08/14/2021    8:23 AM  6CIT Screen  What Year?  0 points  What month?  0 points  What time? 0 points 0 points  Count back from 20 0 points 0 points  Months in reverse 4 points 4 points  Repeat phrase 0 points 0 points  Total Score  4 points    Immunizations Immunization History  Administered Date(s) Administered   Influenza,inj,Quad PF,6+ Mos 07/18/2019, 05/01/2021, 05/29/2022   Influenza-Unspecified 06/10/2013, 05/16/2014, 05/19/2015, 04/29/2016, 05/21/2017, 07/23/2018   Moderna SARS-COV2 Booster Vaccination 06/15/2021   Moderna Sars-Covid-2 Vaccination 11/03/2019, 11/24/2019, 12/15/2020   Pneumococcal Polysaccharide-23 06/10/2013    TDAP status: Due, Education has been provided regarding the importance of this vaccine. Advised may receive this vaccine at local pharmacy or Health Dept. Aware to provide a copy of the vaccination record if obtained from local pharmacy or Health Dept. Verbalized acceptance and understanding.  Flu Vaccine status: Up to date  Pneumococcal vaccine status: Up to date  Covid-19 vaccine status: Completed vaccines Has had 2023 vaccination , will bring immunization card to next visit  Qualifies for Shingles Vaccine? Yes   Zostavax completed No   Shingrix Completed?: No.    Education has been provided regarding the importance of this vaccine. Patient has been advised to call insurance company to determine out of pocket expense  if they have not yet received this vaccine. Advised may also receive vaccine at local pharmacy or Health Dept. Verbalized acceptance and understanding.  Screening Tests Health Maintenance  Topic Date Due   DTaP/Tdap/Td (1 - Tdap) Never done   Zoster Vaccines- Shingrix (1 of 2) Never done   COLONOSCOPY (Pts 45-58yrs Insurance coverage will need to be confirmed)  Never done   Medicare Annual Wellness (AWV)  08/14/2022   MAMMOGRAM  01/03/2024   INFLUENZA VACCINE  Completed   Hepatitis C Screening  Completed   HIV Screening  Completed   HPV VACCINES  Aged Out   PAP SMEAR-Modifier  Discontinued   COVID-19 Vaccine  Discontinued    Health Maintenance  Health Maintenance Due  Topic Date Due   DTaP/Tdap/Td (1 - Tdap) Never done   Zoster Vaccines- Shingrix (1 of 2) Never done   COLONOSCOPY (Pts 45-49yrs Insurance coverage will need to be confirmed)  Never done  Medicare Annual Wellness (AWV)  08/14/2022    Colorectal cancer screening: Has Cologuard , needs to complete test.   Mammogram status: Completed 01/02/2022. Repeat every year  Lung Cancer Screening: (Low Dose CT Chest recommended if Age 47-80 years, 30 pack-year currently smoking OR have quit w/in 15years.) does not qualify.   Additional Screening:  Hepatitis C Screening: does not qualify; Completed 02/06/2022  Vision Screening: Recommended annual ophthalmology exams for early detection of glaucoma and other disorders of the eye. Is the patient up to date with their annual eye exam?  Yes  Who is the provider or what is the name of the office in which the patient attends annual eye exams? [facility] If pt is not established with a provider, would they like to be referred to a provider to establish care? No .   Dental Screening: Recommended annual dental exams for proper oral hygiene  Community Resource Referral / Chronic Care Management: CRR required this visit?  No   CCM required this visit?  No      Plan:     Cough -  RSV, Covid, Flu test - Avoid others while having these symptoms  - Recommend continuing to treat symptoms with OTC medications   I have personally reviewed and noted the following in the patient's chart:   Medical and social history Use of alcohol, tobacco or illicit drugs  Current medications and supplements including opioid prescriptions. Patient is not currently taking opioid prescriptions. Functional ability and status Nutritional status Physical activity Advanced directives List of other physicians Hospitalizations, surgeries, and ER visits in previous 12 months Vitals Screenings to include cognitive, depression, and falls Referrals and appointments  In addition, I have reviewed and discussed with patient certain preventive protocols, quality metrics, and best practice recommendations. A written personalized care plan for preventive services as well as general preventive health recommendations were provided to patient.     Lorene Dy, MD   08/20/2022

## 2022-08-21 ENCOUNTER — Other Ambulatory Visit: Payer: Self-pay | Admitting: Family Medicine

## 2022-08-21 DIAGNOSIS — J0120 Acute ethmoidal sinusitis, unspecified: Secondary | ICD-10-CM

## 2022-08-22 LAB — COVID-19, FLU A+B AND RSV
Influenza A, NAA: NOT DETECTED
Influenza B, NAA: NOT DETECTED
RSV, NAA: NOT DETECTED
SARS-CoV-2, NAA: DETECTED — AB

## 2022-08-29 ENCOUNTER — Encounter: Payer: Self-pay | Admitting: Family Medicine

## 2022-08-29 ENCOUNTER — Ambulatory Visit (INDEPENDENT_AMBULATORY_CARE_PROVIDER_SITE_OTHER): Payer: Medicare HMO | Admitting: Family Medicine

## 2022-08-29 VITALS — BP 134/80 | HR 76 | Ht 62.5 in | Wt 208.1 lb

## 2022-08-29 DIAGNOSIS — I10 Essential (primary) hypertension: Secondary | ICD-10-CM | POA: Diagnosis not present

## 2022-08-29 DIAGNOSIS — E559 Vitamin D deficiency, unspecified: Secondary | ICD-10-CM

## 2022-08-29 DIAGNOSIS — E7849 Other hyperlipidemia: Secondary | ICD-10-CM | POA: Diagnosis not present

## 2022-08-29 DIAGNOSIS — E038 Other specified hypothyroidism: Secondary | ICD-10-CM | POA: Diagnosis not present

## 2022-08-29 DIAGNOSIS — R7301 Impaired fasting glucose: Secondary | ICD-10-CM | POA: Diagnosis not present

## 2022-08-29 DIAGNOSIS — E782 Mixed hyperlipidemia: Secondary | ICD-10-CM

## 2022-08-29 NOTE — Patient Instructions (Addendum)
I appreciate the opportunity to provide care to you today!    Follow up:  4 months  Labs: please stop by the lab during the week to get your blood drawn (CBC, CMP, TSH, Lipid profile, HgA1c, Vit D)  Physical activity helps: Lower your blood glucose, improve your heart health, lower your blood pressure and cholesterol, burn calories to help manage her weight, gave you energy, lower stress, and improve his sleep.  The American diabetes Association (ADA) recommends being active for 2-1/2 hours (150 minutes) or more week.  Exercise for 30 minutes, 5 days a week (150 minutes total)     Please continue to a heart-healthy diet and increase your physical activities. Try to exercise for 29mins at least five times a week.      It was a pleasure to see you and I look forward to continuing to work together on your health and well-being. Please do not hesitate to call the office if you need care or have questions about your care.   Have a wonderful day and week. With Gratitude, Alvira Monday MSN, FNP-BC

## 2022-08-29 NOTE — Assessment & Plan Note (Signed)
She takes atorvastatin 80 mg daily, ezetimibe 10 mg daily, fenofibrate 48 mg daily, and omega-3 fatty acid 2 g twice daily She reports compliance with treatment regimen She denies muscle aches and pain Will assess lipid panel today Lab Results  Component Value Date   CHOL 198 05/29/2022   HDL 40 05/29/2022   LDLCALC 120 (H) 05/29/2022   TRIG 218 (H) 05/29/2022   CHOLHDL 5.0 (H) 05/29/2022

## 2022-08-29 NOTE — Assessment & Plan Note (Signed)
Controlled She takes amlodipine 10 mg daily and lisinopril 5 mg daily She reports compliance with treatment regiment Patient is asymptomatic Encouraged low-sodium diet with increased physical activities Dash diet reviewed Will assess CBC and CMP today Encouraged to continue treatment regimen BP Readings from Last 3 Encounters:  08/29/22 134/80  08/20/22 125/78  03/28/22 131/77

## 2022-08-29 NOTE — Progress Notes (Signed)
Established Patient Office Visit  Subjective:  Patient ID: [name], female    DOB: 10/13/1960  Age: 62 y.o. MRN: [id number]  CC:  Chief Complaint  Patient presents with   Follow-up    Follow up, pt reports feeling well.    HPI [name] is a 62 y.o. female with past medical history of hypertension, hyperlipidemia, pneumonia, and constipation presents for f/u of  chronic medical conditions. For the details of today's visit, please refer to the assessment and plan.     Past Medical History:  Diagnosis Date   Anemia    Anxiety    Arthritis    back, hands , knees    Chest pain 2010   due to sarcoidosis- per pt.    Chronic pain    Depression    GERD (gastroesophageal reflux disease)    Hypercholesterolemia    Hypertension    Pneumonia    sarcoidosis- in remission    Pulmonary sarcoidosis (HCC)    Treatment with Prednisone    Past Surgical History:  Procedure Laterality Date   ABDOMINAL HYSTERECTOMY     APPENDECTOMY     CHOLECYSTECTOMY     EXCISION MASS ABDOMINAL N/A 03/20/2020   Procedure: Excision of anterior abdominal wall fat necrosis;  Surgeon: Allena Napoleon, MD;  Location: [facility];  Service: Plastics;  Laterality: N/A;   HERNIA REPAIR     INCISIONAL HERNIA REPAIR N/A 07/01/2019   Procedure: OPEN INCISIONAL HERNIA REPAIR WITH MESH;  Surgeon: Kinsinger, De Blanch, MD;  Location: [facility] OR;  Service: General;  Laterality: N/A;   INCISIONAL HERNIA REPAIR N/A 02/20/2021   Procedure: LAPAROSCOPIC RECURRENT INCISIONAL HERNIA REPAIR WITH MESH;  Surgeon: Sheliah Hatch De Blanch, MD;  Location: [facility]S;  Service: General;  Laterality: N/A;   INSERTION OF MESH N/A 07/01/2019   Procedure: Insertion Of Mesh;  Surgeon: Kinsinger, De Blanch, MD;  Location: [facility] OR;  Service: General;  Laterality: N/A;   LAPAROSCOPIC GASTRIC SLEEVE RESECTION N/A 02/15/2019   Procedure: LAPAROSCOPIC GASTRIC SLEEVE RESECTION, UPPER ENDO, ERAS Pathway, Hiatal Hernia Repair;   Surgeon: Kinsinger, De Blanch, MD;  Location: [facility];  Service: General;  Laterality: N/A;   LAPAROSCOPY N/A 02/17/2019   Procedure: LAPAROSCOPY DIAGNOSTIC CONVERTED TO OPEN, EVACUATION OF HEMATOMA AND LIGATION OF VESSEL;  Surgeon: Kinsinger, De Blanch, MD;  Location: [facility];  Service: General;  Laterality: N/A;   LAPAROSCOPY N/A 07/01/2019   Procedure: Laparoscopy Diagnostic;  Surgeon: Sheliah Hatch, De Blanch, MD;  Location: [facility] OR;  Service: General;  Laterality: N/A;   SMALL INTESTINE SURGERY      Family History  Problem Relation Age of Onset   Hypertension Mother    Hyperlipidemia Mother    Lupus Father    Brain cancer Sister        2020   Heart disease Brother    Heart attack Brother    Kidney disease Maternal Grandmother    Lung cancer Neg Hx     Social History   Socioeconomic History   Marital status: Divorced    Spouse name: Not on file   Number of children: 2   Years of education: 12th   Highest education level: Some college, no degree  Occupational History   Occupation: foster parent  Tobacco Use   Smoking status: Former    Packs/day: 0.20    Years: 30.00    Total pack years: 6.00    Types: Cigarettes    Quit date: 11/2021    Years since  quitting: 0.8   Smokeless tobacco: Never   Tobacco comments:    Quit smoking 04/23  Vaping Use   Vaping Use: Never used  Substance and Sexual Activity   Alcohol use: Not Currently    Comment: some   Drug use: No   Sexual activity: Yes    Birth control/protection: Surgical  Other Topics Concern   Not on file  Social History Narrative   does regular exercise. Three times a week   Social Determinants of Health   Financial Resource Strain: Medium Risk (08/14/2021)   Overall Financial Resource Strain (CARDIA)    Difficulty of Paying Living Expenses: Somewhat hard  Food Insecurity: No Food Insecurity (08/14/2021)   Hunger Vital Sign    Worried About Running Out of Food in the Last Year: Never true    Ran Out of Food in the  Last Year: Never true  Transportation Needs: No Transportation Needs (08/14/2021)   PRAPARE - Hydrologist (Medical): No    Lack of Transportation (Non-Medical): No  Physical Activity: Sufficiently Active (08/14/2021)   Exercise Vital Sign    Days of Exercise per Week: 3 days    Minutes of Exercise per Session: 50 min  Stress: Stress Concern Present (08/14/2021)   [place]    Feeling of Stress : Rather much  Social Connections: Moderately Isolated (08/14/2021)   Social Connection and Isolation Panel [NHANES]    Frequency of Communication with Friends and Family: More than three times a week    Frequency of Social Gatherings with Friends and Family: Once a week    Attends Religious Services: 1 to 4 times per year    Active Member of [employer] or Organizations: No    Attends Archivist Meetings: Never    Marital Status: Divorced  Human resources officer Violence: Not At Risk (08/14/2021)   Humiliation, Afraid, Rape, and Kick questionnaire    Fear of Current or Ex-Partner: No    Emotionally Abused: No    Physically Abused: No    Sexually Abused: No    Outpatient Medications Prior to Visit  Medication Sig Dispense Refill   amLODipine (NORVASC) 10 MG tablet Take 1 tablet (10 mg total) by mouth daily. 90 tablet 3   atorvastatin (LIPITOR) 80 MG tablet TAKE ONE TABLET BY MOUTH ONCE DAILY. 90 tablet 0   Blood Pressure Monitoring (BLOOD PRESSURE KIT) KIT Check your blood pressure 3 x a week 1 kit 0   Cholecalciferol (VITAMIN D3) 25 MCG (1000 UT) CAPS Take 1 capsule (1,000 Units total) by mouth daily. 30 capsule 3   Elastic Bandages & Supports (ABDOMINAL BINDER/ELASTIC LARGE) MISC 1 each by Does not apply route daily. 1 each 0   ezetimibe (ZETIA) 10 MG tablet Take 1 tablet (10 mg total) by mouth daily. 90 tablet 3   fenofibrate (TRICOR) 48 MG tablet TAKE ONE TABLET BY MOUTH ONCE DAILY. 90 tablet 0    fluticasone (FLONASE) 50 MCG/ACT nasal spray PLACE 2 SPRAYS INTO BOTH NOSTRILS DAILY. 16 g 0   ibuprofen (ADVIL) 800 MG tablet Take 1 tablet (800 mg total) by mouth every 8 (eight) hours as needed. 30 tablet 0   lisinopril (ZESTRIL) 5 MG tablet Take 1 tablet (5 mg total) by mouth daily. 90 tablet 3   Multiple Vitamins-Minerals (WOMENS 50+ MULTI VITAMIN/MIN PO) Take 1 tablet by mouth daily.     RESTASIS 0.05 % ophthalmic emulsion 1 drop 2 (two) times  daily.     omega-3 acid ethyl esters (LOVAZA) 1 g capsule Take 2 capsules (2 g total) by mouth 2 (two) times daily. 360 capsule 0   No facility-administered medications prior to visit.    Allergies  Allergen Reactions   Aspirin Hives    ROS Review of Systems  Constitutional:  Negative for chills and fever.  Eyes:  Negative for visual disturbance.  Respiratory:  Negative for chest tightness and shortness of breath.   Neurological:  Negative for dizziness and headaches.      Objective:    Physical Exam HENT:     Head: Normocephalic.     Mouth/Throat:     Mouth: Mucous membranes are moist.  Cardiovascular:     Rate and Rhythm: Normal rate.     Heart sounds: Normal heart sounds.  Pulmonary:     Effort: Pulmonary effort is normal.     Breath sounds: Normal breath sounds.  Neurological:     Mental Status: She is alert.     BP 134/80 (BP Location: Left Arm)   Pulse 76   Ht 5' 2.5" (1.588 m)   Wt 208 lb 1.9 oz (94.4 kg)   SpO2 97%   BMI 37.46 kg/m  Wt Readings from Last 3 Encounters:  08/29/22 208 lb 1.9 oz (94.4 kg)  08/20/22 206 lb (93.4 kg)  03/28/22 201 lb (91.2 kg)    Lab Results  Component Value Date   TSH 2.990 05/29/2022   Lab Results  Component Value Date   WBC 5.8 05/29/2022   HGB 13.5 05/29/2022   HCT 40.3 05/29/2022   MCV 91 05/29/2022   PLT 283 05/29/2022   Lab Results  Component Value Date   NA 142 05/29/2022   K 3.5 05/29/2022   CO2 25 05/29/2022   GLUCOSE 89 05/29/2022   BUN 3 (L) 05/29/2022    CREATININE 0.76 05/29/2022   BILITOT 0.7 05/29/2022   ALKPHOS 103 05/29/2022   AST 32 05/29/2022   ALT 14 05/29/2022   PROT 7.6 05/29/2022   ALBUMIN 3.9 05/29/2022   CALCIUM 10.1 05/29/2022   ANIONGAP 6 02/21/2021   EGFR 90 05/29/2022   Lab Results  Component Value Date   CHOL 198 05/29/2022   Lab Results  Component Value Date   HDL 40 05/29/2022   Lab Results  Component Value Date   LDLCALC 120 (H) 05/29/2022   Lab Results  Component Value Date   TRIG 218 (H) 05/29/2022   Lab Results  Component Value Date   CHOLHDL 5.0 (H) 05/29/2022   Lab Results  Component Value Date   HGBA1C 5.4 05/29/2022      Assessment & Plan:  Hypertension, unspecified type Assessment & Plan: Controlled She takes amlodipine 10 mg daily and lisinopril 5 mg daily She reports compliance with treatment regiment Patient is asymptomatic Encouraged low-sodium diet with increased physical activities Dash diet reviewed Will assess CBC and CMP today Encouraged to continue treatment regimen BP Readings from Last 3 Encounters:  08/29/22 134/80  08/20/22 125/78  03/28/22 131/77     Orders: -     CMP14+EGFR -     CBC with Differential/Platelet  Other hyperlipidemia Assessment & Plan: She takes atorvastatin 80 mg daily, ezetimibe 10 mg daily, fenofibrate 48 mg daily, and omega-3 fatty acid 2 g twice daily She reports compliance with treatment regimen She denies muscle aches and pain Will assess lipid panel today Lab Results  Component Value Date   CHOL 198 05/29/2022  HDL 40 05/29/2022   LDLCALC 120 (H) 05/29/2022   TRIG 218 (H) 05/29/2022   CHOLHDL 5.0 (H) 05/29/2022      Mixed hyperlipidemia -     Lipid panel  Other specified hypothyroidism -     TSH + free T4  Vitamin D deficiency -     VITAMIN D 25 Hydroxy (Vit-D Deficiency, Fractures)  IFG (impaired fasting glucose) -     Hemoglobin A1c    Follow-up: Return in about 4 months (around 12/28/2022).   Alvira Monday, FNP

## 2022-09-06 ENCOUNTER — Encounter (HOSPITAL_COMMUNITY): Payer: Self-pay | Admitting: *Deleted

## 2022-09-09 ENCOUNTER — Encounter (HOSPITAL_COMMUNITY): Payer: Self-pay | Admitting: Emergency Medicine

## 2022-09-09 ENCOUNTER — Other Ambulatory Visit: Payer: Self-pay

## 2022-09-09 ENCOUNTER — Emergency Department (HOSPITAL_COMMUNITY)
Admission: EM | Admit: 2022-09-09 | Discharge: 2022-09-09 | Disposition: A | Discharge status: Home / Self Care | Payer: Medicare HMO | Attending: Emergency Medicine | Admitting: Emergency Medicine

## 2022-09-09 DIAGNOSIS — Z79899 Other long term (current) drug therapy: Secondary | ICD-10-CM | POA: Diagnosis not present

## 2022-09-09 DIAGNOSIS — B2790 Infectious mononucleosis, unspecified without complication: Secondary | ICD-10-CM | POA: Insufficient documentation

## 2022-09-09 DIAGNOSIS — Z7721 Contact with and (suspected) exposure to potentially hazardous body fluids: Secondary | ICD-10-CM | POA: Diagnosis not present

## 2022-09-09 DIAGNOSIS — Z20828 Contact with and (suspected) exposure to other viral communicable diseases: Secondary | ICD-10-CM

## 2022-09-09 LAB — MONONUCLEOSIS SCREEN: Mono Screen: POSITIVE — AB

## 2022-09-09 NOTE — ED Provider Notes (Signed)
[facility] Provider Note   CSN: [id number] Arrival date & time: 09/09/22  0038     History  Chief Complaint  Patient presents with   Mono Test    [name] is a 62 y.o. female.  62 year old female who presents ER today secondary to requesting a monotest.  Patient asymptomatic.  She states she is upset because she found out that her husband has mono and she thinks that he may have gotten it from someone else.  Of note her husband is undergoing chemotherapy right now.  He came in with sore throat was found to have mono.  Patient does not have sore throat, fever, swollen lymph nodes, fatigue or any other symptoms.  She did have COVID a couple weeks ago but is fully recovered.  She just wants to be tested.        Home Medications Prior to Admission medications   Medication Sig Start Date End Date Taking? Authorizing Provider  amLODipine (NORVASC) 10 MG tablet Take 1 tablet (10 mg total) by mouth daily. 07/12/21   Paseda, Dewaine Conger, FNP  atorvastatin (LIPITOR) 80 MG tablet TAKE ONE TABLET BY MOUTH ONCE DAILY. 06/20/22   Alvira Monday, FNP  Blood Pressure Monitoring (BLOOD PRESSURE KIT) KIT Check your blood pressure 3 x a week 07/12/21   Vena Rua R, FNP  Cholecalciferol (VITAMIN D3) 25 MCG (1000 UT) CAPS Take 1 capsule (1,000 Units total) by mouth daily. 02/07/22   Renee Rival, FNP  Elastic Bandages & Supports (ABDOMINAL BINDER/ELASTIC LARGE) MISC 1 each by Does not apply route daily. 02/06/22   Renee Rival, FNP  ezetimibe (ZETIA) 10 MG tablet Take 1 tablet (10 mg total) by mouth daily. 05/30/22   Alvira Monday, FNP  fenofibrate (TRICOR) 48 MG tablet TAKE ONE TABLET BY MOUTH ONCE DAILY. 07/09/22   Alvira Monday, FNP  fluticasone (FLONASE) 50 MCG/ACT nasal spray PLACE 2 SPRAYS INTO BOTH NOSTRILS DAILY. 08/21/22   Alvira Monday, FNP  ibuprofen (ADVIL) 800 MG tablet Take 1 tablet (800 mg total) by mouth every 8  (eight) hours as needed. 02/23/21   Kinsinger, Arta Bruce, MD  lisinopril (ZESTRIL) 5 MG tablet Take 1 tablet (5 mg total) by mouth daily. 05/29/22   Alvira Monday, FNP  Multiple Vitamins-Minerals (WOMENS 50+ MULTI VITAMIN/MIN PO) Take 1 tablet by mouth daily.    [provider]  omega-3 acid ethyl esters (LOVAZA) 1 g capsule Take 2 capsules (2 g total) by mouth 2 (two) times daily. 12/19/21 03/19/22  Paseda, Dewaine Conger, FNP  RESTASIS 0.05 % ophthalmic emulsion 1 drop 2 (two) times daily. 12/03/21   [provider]      Allergies    Aspirin    Review of Systems   Review of Systems  Physical Exam Updated Vital Signs BP 137/82 (BP Location: Right Arm)   Pulse 69   Temp 98 F (36.7 C) (Oral)   Resp 17   Ht 5' 2.5" (1.588 m)   Wt 94.4 kg   SpO2 98%   BMI 37.46 kg/m  Physical Exam Vitals and nursing note reviewed.  Constitutional:      Appearance: She is well-developed.  HENT:     Head: Normocephalic and atraumatic.  Eyes:     Pupils: Pupils are equal, round, and reactive to light.  Cardiovascular:     Rate and Rhythm: Normal rate and regular rhythm.  Pulmonary:     Effort: No respiratory distress.  Breath sounds: No stridor.  Abdominal:     General: There is no distension.  Musculoskeletal:        General: Normal range of motion.     Cervical back: Normal range of motion.  Lymphadenopathy:     Cervical: No cervical adenopathy.  Skin:    General: Skin is warm and dry.  Neurological:     General: No focal deficit present.     Mental Status: She is alert.     ED Results / Procedures / Treatments   Labs (all labs ordered are listed, but only abnormal results are displayed) Labs Reviewed  MONONUCLEOSIS SCREEN - Abnormal; Notable for the following components:      Result Value   Mono Screen POSITIVE (*)    All other components within normal limits    EKG None  Radiology No results found.  Procedures Procedures    Medications Ordered in  ED Medications - No data to display  ED Course/ Medical Decision Making/ A&P                             Medical Decision Making Amount and/or Complexity of Data Reviewed Labs: ordered.   Patient opted to check her test results at home as she had no symptoms and there is no real treatment for besides supportive care.  I discussed with her that it is a herpes type virus that will be latent in your system and the only thing to worry about if he had an active infection which I think she does not would be splenomegaly and not having splenic rupture otherwise just treat her symptoms accordingly.  She will check her on test results at home.  Final Clinical Impression(s) / ED Diagnoses Final diagnoses:  Mono exposure    Rx / DC Orders ED Discharge Orders     None         Leota Maka, Corene Cornea, MD 09/09/22 [phone]

## 2022-09-09 NOTE — ED Triage Notes (Signed)
Pt arrives POV stating she needs a test for mono. Pt states her husband was here earlier tonight and tested positive for mono.

## 2022-09-11 ENCOUNTER — Telehealth: Payer: Self-pay

## 2022-09-11 NOTE — Telephone Encounter (Signed)
     Patient  visit on 1/29  at [facility]  Patient has not had a follow up yet and she is waiting on medication from the ED visit to be filled. It was never sent  Have you been able to follow up with your primary care physician? No   The patient was or was not able to obtain any needed medicine or equipment. No   Are there diet recommendations that you are having difficulty following? No   Patient expresses understanding of discharge instructions and education provided has no other needs at this time. Yes    [facility] [phone] [address], [place], [place] [address] Phone: [phone] Email: [email or web address]

## 2022-09-17 ENCOUNTER — Other Ambulatory Visit: Payer: Self-pay | Admitting: Family Medicine

## 2022-09-17 ENCOUNTER — Other Ambulatory Visit: Payer: Self-pay | Admitting: Nurse Practitioner

## 2022-09-17 DIAGNOSIS — J0120 Acute ethmoidal sinusitis, unspecified: Secondary | ICD-10-CM

## 2022-09-17 DIAGNOSIS — E7849 Other hyperlipidemia: Secondary | ICD-10-CM

## 2022-10-11 DIAGNOSIS — E782 Mixed hyperlipidemia: Secondary | ICD-10-CM | POA: Diagnosis not present

## 2022-10-11 DIAGNOSIS — E559 Vitamin D deficiency, unspecified: Secondary | ICD-10-CM | POA: Diagnosis not present

## 2022-10-11 DIAGNOSIS — E038 Other specified hypothyroidism: Secondary | ICD-10-CM | POA: Diagnosis not present

## 2022-10-11 DIAGNOSIS — I10 Essential (primary) hypertension: Secondary | ICD-10-CM | POA: Diagnosis not present

## 2022-10-11 DIAGNOSIS — R7301 Impaired fasting glucose: Secondary | ICD-10-CM | POA: Diagnosis not present

## 2022-10-12 LAB — CMP14+EGFR
ALT: 19 IU/L (ref 0–32)
AST: 44 IU/L — ABNORMAL HIGH (ref 0–40)
Albumin/Globulin Ratio: 1.2 (ref 1.2–2.2)
Albumin: 4 g/dL (ref 3.9–4.9)
Alkaline Phosphatase: 98 IU/L (ref 44–121)
BUN/Creatinine Ratio: 4 — ABNORMAL LOW (ref 12–28)
BUN: 3 mg/dL — ABNORMAL LOW (ref 8–27)
Bilirubin Total: 0.5 mg/dL (ref 0.0–1.2)
CO2: 22 mmol/L (ref 20–29)
Calcium: 9.3 mg/dL (ref 8.7–10.3)
Chloride: 102 mmol/L (ref 96–106)
Creatinine, Ser: 0.72 mg/dL (ref 0.57–1.00)
Globulin, Total: 3.3 g/dL (ref 1.5–4.5)
Glucose: 83 mg/dL (ref 70–99)
Potassium: 3.6 mmol/L (ref 3.5–5.2)
Sodium: 143 mmol/L (ref 134–144)
Total Protein: 7.3 g/dL (ref 6.0–8.5)
eGFR: 95 mL/min/{1.73_m2} (ref >59)

## 2022-10-12 LAB — CBC WITH DIFFERENTIAL/PLATELET
Basophils Absolute: 0 10*3/uL (ref 0.0–0.2)
Basos: 1 %
EOS (ABSOLUTE): 0.2 10*3/uL (ref 0.0–0.4)
Eos: 3 %
Hematocrit: 42.1 % (ref 34.0–46.6)
Hemoglobin: 14 g/dL (ref 11.1–15.9)
Immature Grans (Abs): 0 10*3/uL (ref 0.0–0.1)
Immature Granulocytes: 0 %
Lymphocytes Absolute: 1.5 10*3/uL (ref 0.7–3.1)
Lymphs: 26 %
MCH: 31.1 pg (ref 26.6–33.0)
MCHC: 33.3 g/dL (ref 31.5–35.7)
MCV: 94 fL (ref 79–97)
Monocytes Absolute: 0.5 10*3/uL (ref 0.1–0.9)
Monocytes: 9 %
Neutrophils Absolute: 3.6 10*3/uL (ref 1.4–7.0)
Neutrophils: 61 %
Platelets: 332 10*3/uL (ref 150–450)
RBC: 4.5 x10E6/uL (ref 3.77–5.28)
RDW: 13.8 % (ref 11.7–15.4)
WBC: 5.9 10*3/uL (ref 3.4–10.8)

## 2022-10-12 LAB — LIPID PANEL
Chol/HDL Ratio: 5 ratio — ABNORMAL HIGH (ref 0.0–4.4)
Cholesterol, Total: 201 mg/dL — ABNORMAL HIGH (ref 100–199)
HDL: 40 mg/dL (ref >39)
LDL Chol Calc (NIH): 116 mg/dL — ABNORMAL HIGH (ref 0–99)
Triglycerides: 258 mg/dL — ABNORMAL HIGH (ref 0–149)
VLDL Cholesterol Cal: 45 mg/dL — ABNORMAL HIGH (ref 5–40)

## 2022-10-12 LAB — HEMOGLOBIN A1C
Est. average glucose Bld gHb Est-mCnc: 114 mg/dL
Hgb A1c MFr Bld: 5.6 % (ref 4.8–5.6)

## 2022-10-12 LAB — VITAMIN D 25 HYDROXY (VIT D DEFICIENCY, FRACTURES): Vit D, 25-Hydroxy: 32.2 ng/mL (ref 30.0–100.0)

## 2022-10-12 LAB — TSH+FREE T4
Free T4: 1.1 ng/dL (ref 0.82–1.77)
TSH: 3.74 u[IU]/mL (ref 0.450–4.500)

## 2022-10-13 ENCOUNTER — Other Ambulatory Visit: Payer: Self-pay | Admitting: Family Medicine

## 2022-10-13 DIAGNOSIS — E782 Mixed hyperlipidemia: Secondary | ICD-10-CM

## 2022-10-13 NOTE — Progress Notes (Signed)
Kindly verify if the patient is compliant with atorvastatin 80 mg daily, fenofibrate 48 mg daily, ezetimibe 10 mg daily, and Lovaza 1 g capsule twice daily for her cholesterol.  Her cholesterol levels are elevated.  I want her LDL less than 100.  I recommend avoiding simple carbohydrates, including cakes, sweet desserts, ice cream, soda (diet or regular), sweet tea, candies, chips, cookies, store-bought juices, alcohol in excess of 1-2 drinks a day, lemonade, artificial sweeteners, donuts, coffee creamers, and sugar-free products.  I recommend avoiding greasy, fatty foods with increased physical activity. All other labs are stable

## 2022-10-13 NOTE — Progress Notes (Signed)
The 10-year ASCVD risk score (Arnett DK, et al., 2019) is: 10.3%   Values used to calculate the score:     Age: 62 years     Sex: Female     Is Non-Hispanic African American: Yes     Diabetic: No     Tobacco smoker: No     Systolic Blood Pressure: 0000000 mmHg     Is BP treated: Yes     HDL Cholesterol: 40 mg/dL     Total Cholesterol: 201 mg/dL

## 2022-10-18 ENCOUNTER — Other Ambulatory Visit: Payer: Self-pay | Admitting: Family Medicine

## 2022-10-18 DIAGNOSIS — J0120 Acute ethmoidal sinusitis, unspecified: Secondary | ICD-10-CM

## 2022-10-18 DIAGNOSIS — E7849 Other hyperlipidemia: Secondary | ICD-10-CM

## 2022-11-09 ENCOUNTER — Emergency Department (HOSPITAL_COMMUNITY): Payer: Medicare HMO

## 2022-11-09 ENCOUNTER — Other Ambulatory Visit: Payer: Self-pay

## 2022-11-09 ENCOUNTER — Encounter (HOSPITAL_COMMUNITY): Payer: Self-pay

## 2022-11-09 ENCOUNTER — Emergency Department (HOSPITAL_COMMUNITY)
Admission: EM | Admit: 2022-11-09 | Discharge: 2022-11-09 | Disposition: A | Discharge status: Home / Self Care | Payer: Medicare HMO | Attending: Emergency Medicine | Admitting: Emergency Medicine

## 2022-11-09 DIAGNOSIS — R0602 Shortness of breath: Secondary | ICD-10-CM | POA: Diagnosis not present

## 2022-11-09 DIAGNOSIS — Z87891 Personal history of nicotine dependence: Secondary | ICD-10-CM | POA: Insufficient documentation

## 2022-11-09 DIAGNOSIS — I2510 Atherosclerotic heart disease of native coronary artery without angina pectoris: Secondary | ICD-10-CM | POA: Diagnosis not present

## 2022-11-09 DIAGNOSIS — K859 Acute pancreatitis without necrosis or infection, unspecified: Secondary | ICD-10-CM | POA: Insufficient documentation

## 2022-11-09 DIAGNOSIS — Z79899 Other long term (current) drug therapy: Secondary | ICD-10-CM | POA: Insufficient documentation

## 2022-11-09 DIAGNOSIS — I700 Atherosclerosis of aorta: Secondary | ICD-10-CM | POA: Diagnosis not present

## 2022-11-09 DIAGNOSIS — R1013 Epigastric pain: Secondary | ICD-10-CM | POA: Diagnosis not present

## 2022-11-09 DIAGNOSIS — I10 Essential (primary) hypertension: Secondary | ICD-10-CM | POA: Diagnosis not present

## 2022-11-09 DIAGNOSIS — R109 Unspecified abdominal pain: Secondary | ICD-10-CM | POA: Diagnosis not present

## 2022-11-09 DIAGNOSIS — J984 Other disorders of lung: Secondary | ICD-10-CM | POA: Insufficient documentation

## 2022-11-09 DIAGNOSIS — R7989 Other specified abnormal findings of blood chemistry: Secondary | ICD-10-CM | POA: Diagnosis not present

## 2022-11-09 LAB — COMPREHENSIVE METABOLIC PANEL
ALT: 23 U/L (ref 0–44)
AST: 57 U/L — ABNORMAL HIGH (ref 15–41)
Albumin: 3.4 g/dL — ABNORMAL LOW (ref 3.5–5.0)
Alkaline Phosphatase: 77 U/L (ref 38–126)
Anion gap: 11 (ref 5–15)
BUN: 5 mg/dL — ABNORMAL LOW (ref 8–23)
CO2: 24 mmol/L (ref 22–32)
Calcium: 8.7 mg/dL — ABNORMAL LOW (ref 8.9–10.3)
Chloride: 103 mmol/L (ref 98–111)
Creatinine, Ser: 0.64 mg/dL (ref 0.44–1.00)
GFR, Estimated: >60 mL/min (ref >60)
Glucose, Bld: 133 mg/dL — ABNORMAL HIGH (ref 70–99)
Potassium: 3.1 mmol/L — ABNORMAL LOW (ref 3.5–5.1)
Sodium: 138 mmol/L (ref 135–145)
Total Bilirubin: 0.5 mg/dL (ref 0.3–1.2)
Total Protein: 8 g/dL (ref 6.5–8.1)

## 2022-11-09 LAB — CBC
HCT: 39.3 % (ref 36.0–46.0)
Hemoglobin: 13.6 g/dL (ref 12.0–15.0)
MCH: 31.1 pg (ref 26.0–34.0)
MCHC: 34.6 g/dL (ref 30.0–36.0)
MCV: 89.9 fL (ref 80.0–100.0)
Platelets: 321 10*3/uL (ref 150–400)
RBC: 4.37 MIL/uL (ref 3.87–5.11)
RDW: 13.4 % (ref 11.5–15.5)
WBC: 9.3 10*3/uL (ref 4.0–10.5)
nRBC: 0 % (ref 0.0–0.2)

## 2022-11-09 LAB — URINALYSIS, ROUTINE W REFLEX MICROSCOPIC
Bilirubin Urine: NEGATIVE
Glucose, UA: NEGATIVE mg/dL
Ketones, ur: NEGATIVE mg/dL
Leukocytes,Ua: NEGATIVE
Nitrite: NEGATIVE
Protein, ur: NEGATIVE mg/dL
Specific Gravity, Urine: 1.002 — ABNORMAL LOW (ref 1.005–1.030)
pH: 7 (ref 5.0–8.0)

## 2022-11-09 LAB — LIPASE, BLOOD: Lipase: 51 U/L (ref 11–51)

## 2022-11-09 LAB — TROPONIN I (HIGH SENSITIVITY): Troponin I (High Sensitivity): 3 ng/L (ref <18)

## 2022-11-09 LAB — BRAIN NATRIURETIC PEPTIDE: B Natriuretic Peptide: 18 pg/mL (ref 0.0–100.0)

## 2022-11-09 MED ORDER — PANTOPRAZOLE SODIUM 20 MG PO TBEC: 20.0000 mg | DELAYED_RELEASE_TABLET | Freq: Every day | ORAL | 0 refills | Status: DC

## 2022-11-09 MED ORDER — KETOROLAC TROMETHAMINE 15 MG/ML IJ SOLN
15.0000 mg | Freq: Once | INTRAMUSCULAR | Status: AC
  Administered 2022-11-09: 15 mg via INTRAVENOUS
  Filled 2022-11-09: qty 1

## 2022-11-09 MED ORDER — SODIUM CHLORIDE 0.9 % IV BOLUS
1000.0000 mL | Freq: Once | INTRAVENOUS | Status: AC
  Administered 2022-11-09: 1000 mL via INTRAVENOUS

## 2022-11-09 MED ORDER — SUCRALFATE 1 G PO TABS: 1.0000 g | ORAL_TABLET | Freq: Three times a day (TID) | ORAL | 0 refills | Status: DC

## 2022-11-09 MED ORDER — POTASSIUM CHLORIDE CRYS ER 20 MEQ PO TBCR
40.0000 meq | EXTENDED_RELEASE_TABLET | Freq: Once | ORAL | Status: AC
  Administered 2022-11-09: 40 meq via ORAL
  Filled 2022-11-09: qty 2

## 2022-11-09 MED ORDER — PANTOPRAZOLE SODIUM 40 MG IV SOLR
40.0000 mg | Freq: Once | INTRAVENOUS | Status: AC
  Administered 2022-11-09: 40 mg via INTRAVENOUS
  Filled 2022-11-09: qty 10

## 2022-11-09 MED ORDER — FAMOTIDINE IN NACL 20-0.9 MG/50ML-% IV SOLN
20.0000 mg | Freq: Once | INTRAVENOUS | Status: AC
  Administered 2022-11-09: 20 mg via INTRAVENOUS
  Filled 2022-11-09: qty 50

## 2022-11-09 MED ORDER — TRAMADOL HCL 50 MG PO TABS: 50.0000 mg | ORAL_TABLET | Freq: Four times a day (QID) | ORAL | 0 refills | Status: DC | PRN

## 2022-11-09 MED ORDER — HYDROMORPHONE HCL 1 MG/ML IJ SOLN
1.0000 mg | Freq: Once | INTRAMUSCULAR | Status: AC
  Administered 2022-11-09: 1 mg via INTRAVENOUS
  Filled 2022-11-09: qty 1

## 2022-11-09 MED ORDER — ACETAMINOPHEN 325 MG PO TABS: 650.0000 mg | ORAL_TABLET | Freq: Four times a day (QID) | ORAL | 0 refills | Status: DC | PRN

## 2022-11-09 MED ORDER — ACETAMINOPHEN 500 MG PO TABS
1000.0000 mg | ORAL_TABLET | Freq: Once | ORAL | Status: AC
  Administered 2022-11-09: 1000 mg via ORAL
  Filled 2022-11-09: qty 2

## 2022-11-09 MED ORDER — ONDANSETRON HCL 4 MG/2ML IJ SOLN
4.0000 mg | Freq: Once | INTRAMUSCULAR | Status: AC
  Administered 2022-11-09: 4 mg via INTRAVENOUS
  Filled 2022-11-09: qty 2

## 2022-11-09 MED ORDER — IOHEXOL 350 MG/ML SOLN
100.0000 mL | Freq: Once | INTRAVENOUS | Status: AC | PRN
  Administered 2022-11-09: 100 mL via INTRAVENOUS

## 2022-11-09 NOTE — Discharge Instructions (Addendum)
Please refrain from alcohol or tobacco use, avoid NSAIDs.  Please eat a bland diet as outlined below. Please follow up with PCP within the next week for recheck.  If your symptoms persist more than 2 weeks recommend you follow-up with gastroenterology  It was a pleasure caring for you today in the emergency department.  Please return to the emergency department for any worsening or worrisome symptoms.

## 2022-11-09 NOTE — ED Provider Notes (Signed)
[facility] Provider Note  CSN: [id number] Arrival date & time: 11/09/22 2022  Chief Complaint(s) Abdominal Pain  HPI [name] is a 62 y.o. female with past medical history as below, significant for pulmonary sarcoidosis, obesity, hypertension, GERD, HLD who presents to the ED with complaint of abd pain, dyspnea, leg swelling.  Onset around 4 to 5 days ago, progressively worsening abdominal pain, periumbilical, sharp and stabbing.  Nausea without vomiting.  Is worse in the past 24 hours.  Having dyspnea that she attributes to the abdominal pain.  Increased swelling to her left ankle over the past 3 weeks, no history of DVT or PE, no recent travel, no oral hormone use, no trauma.  No Change in bowel or bladder function, no sick contacts, suspicious p.o. intake.  No medication prior to arrival  Past Medical History Past Medical History:  Diagnosis Date   Anemia    Anxiety    Arthritis    back, hands , knees    Chest pain 2010   due to sarcoidosis- per pt.    Chronic pain    Depression    GERD (gastroesophageal reflux disease)    Hypercholesterolemia    Hypertension    Pneumonia    sarcoidosis- in remission    Pulmonary sarcoidosis ([place])    Treatment with Prednisone   Patient Active Problem List   Diagnosis Date Noted   Cough 08/20/2022   Need for immunization against influenza 05/29/2022   Ingrown left big toenail 03/28/2022   Annual physical exam 02/06/2022   History of gastric bypass 02/06/2022   Acute ethmoidal sinusitis 09/12/2021   Screening for diabetes mellitus 09/12/2021   Current every day smoker 09/12/2021   Hypertension 07/12/2021   Vitamin D deficiency 07/12/2021   Aortic atherosclerosis ([place]) 07/12/2021   Other hyperlipidemia 07/21/2019   Other secondary hypertension 07/21/2019   Encounter for screening mammogram for malignant neoplasm of breast 07/21/2019   Incisional hernia 07/01/2019   Postoperative  nausea and vomiting 02/24/2019   Obesity 02/15/2019   Pneumonia 11/19/2016   Back pain 11/19/2016   Constipation 04/27/2012   GASTROESOPHAGEAL REFLUX DISEASE 06/26/2009   SARCOIDOSIS, PULMONARY 06/23/2009   PNEUMONIA 06/23/2009   CHEST PAIN 06/23/2009   Home Medication(s) Prior to Admission medications   Medication Sig Start Date End Date Taking? Authorizing Provider  acetaminophen (TYLENOL) 325 MG tablet Take 2 tablets (650 mg total) by mouth every 6 (six) hours as needed. 11/09/22  Yes [name] A, DO  pantoprazole (PROTONIX) 20 MG tablet Take 1 tablet (20 mg total) by mouth daily for 14 days. 11/09/22 11/23/22 Yes [name] A, DO  sucralfate (CARAFATE) 1 g tablet Take 1 tablet (1 g total) by mouth with breakfast, with lunch, and with evening meal for 7 days. 11/09/22 11/16/22 Yes Jeanell Sparrow, DO  traMADol (ULTRAM) 50 MG tablet Take 1 tablet (50 mg total) by mouth every 6 (six) hours as needed. 11/09/22  Yes [name] A, DO  amLODipine (NORVASC) 10 MG tablet Take 1 tablet (10 mg total) by mouth daily. 07/12/21   Paseda, Dewaine Conger, FNP  atorvastatin (LIPITOR) 80 MG tablet TAKE ONE TABLET BY MOUTH ONCE DAILY. 09/17/22   Alvira Monday, FNP  Blood Pressure Monitoring (BLOOD PRESSURE KIT) KIT Check your blood pressure 3 x a week 07/12/21   Vena Rua R, FNP  Cholecalciferol (VITAMIN D3) 25 MCG (1000 UT) CAPS Take 1 capsule (1,000 Units total) by mouth daily. 02/07/22   Paseda,  Dewaine Conger, FNP  Elastic Bandages & Supports (ABDOMINAL BINDER/ELASTIC LARGE) MISC 1 each by Does not apply route daily. 02/06/22   Renee Rival, FNP  ezetimibe (ZETIA) 10 MG tablet Take 1 tablet (10 mg total) by mouth daily. 05/30/22   Alvira Monday, FNP  fenofibrate (TRICOR) 48 MG tablet TAKE ONE TABLET BY MOUTH ONCE DAILY. 10/18/22   Alvira Monday, FNP  fluticasone (FLONASE) 50 MCG/ACT nasal spray PLACE 2 SPRAYS INTO BOTH NOSTRILS DAILY. 10/18/22   Alvira Monday, FNP  ibuprofen (ADVIL) 800 MG tablet  Take 1 tablet (800 mg total) by mouth every 8 (eight) hours as needed. 02/23/21   Kinsinger, Arta Bruce, MD  lisinopril (ZESTRIL) 5 MG tablet Take 1 tablet (5 mg total) by mouth daily. 05/29/22   Alvira Monday, FNP  Multiple Vitamins-Minerals (WOMENS 50+ MULTI VITAMIN/MIN PO) Take 1 tablet by mouth daily.    [provider]  omega-3 acid ethyl esters (LOVAZA) 1 g capsule Take 2 capsules (2 g total) by mouth 2 (two) times daily. 12/19/21 03/19/22  Paseda, Dewaine Conger, FNP  RESTASIS 0.05 % ophthalmic emulsion 1 drop 2 (two) times daily. 12/03/21   [provider]                                                                                                                                    Past Surgical History Past Surgical History:  Procedure Laterality Date   ABDOMINAL HYSTERECTOMY     APPENDECTOMY     CHOLECYSTECTOMY     EXCISION MASS ABDOMINAL N/A 03/20/2020   Procedure: Excision of anterior abdominal wall fat necrosis;  Surgeon: Cindra Presume, MD;  Location: [place];  Service: Plastics;  Laterality: N/A;   HERNIA REPAIR     INCISIONAL HERNIA REPAIR N/A 07/01/2019   Procedure: OPEN INCISIONAL HERNIA REPAIR WITH MESH;  Surgeon: Kinsinger, Arta Bruce, MD;  Location: [place];  Service: General;  Laterality: N/A;   [place] N/A 02/20/2021   Procedure: LAPAROSCOPIC RECURRENT INCISIONAL HERNIA REPAIR WITH MESH;  Surgeon: Kieth Brightly Arta Bruce, MD;  Location: [facility];  Service: General;  Laterality: N/A;   INSERTION OF MESH N/A 07/01/2019   Procedure: Insertion Of Mesh;  Surgeon: Kinsinger, Arta Bruce, MD;  Location: [place];  Service: General;  Laterality: N/A;   LAPAROSCOPIC GASTRIC SLEEVE RESECTION N/A 02/15/2019   Procedure: LAPAROSCOPIC GASTRIC SLEEVE RESECTION, UPPER ENDO, ERAS Pathway, Hiatal Hernia Repair;  Surgeon: Kinsinger, Arta Bruce, MD;  Location: [facility];  Service: General;  Laterality: N/A;   LAPAROSCOPY N/A 02/17/2019   Procedure:  LAPAROSCOPY DIAGNOSTIC CONVERTED TO OPEN, EVACUATION OF HEMATOMA AND LIGATION OF VESSEL;  Surgeon: Kinsinger, Arta Bruce, MD;  Location: [facility];  Service: General;  Laterality: N/A;   LAPAROSCOPY N/A 07/01/2019   Procedure: Laparoscopy Diagnostic;  Surgeon: Kieth Brightly Arta Bruce, MD;  Location: [facility];  Service: General;  Laterality: N/A;   SMALL INTESTINE SURGERY  Family History Family History  Problem Relation Age of Onset   Hypertension Mother    Hyperlipidemia Mother    Lupus Father    Brain cancer Sister        2020   Heart disease Brother    Heart attack Brother    Kidney disease Maternal Grandmother    Lung cancer Neg Hx     Social History Social History   Tobacco Use   Smoking status: Former    Packs/day: 0.20    Years: 30.00    Additional pack years: 0.00    Total pack years: 6.00    Types: Cigarettes    Quit date: 11/2021    Years since quitting: 0.9   Smokeless tobacco: Never   Tobacco comments:    Quit smoking 04/23  Vaping Use   Vaping Use: Never used  Substance Use Topics   Alcohol use: Not Currently    Comment: some   Drug use: No   Allergies Aspirin  Review of Systems Review of Systems  Constitutional:  Negative for chills and fever.  HENT:  Negative for facial swelling and trouble swallowing.   Eyes:  Negative for photophobia and visual disturbance.  Respiratory:  Positive for chest tightness and shortness of breath. Negative for cough.   Cardiovascular:  Positive for leg swelling. Negative for chest pain and palpitations.  Gastrointestinal:  Positive for abdominal pain. Negative for nausea and vomiting.  Endocrine: Negative for polydipsia and polyuria.  Genitourinary:  Negative for difficulty urinating and hematuria.  Musculoskeletal:  Negative for gait problem and joint swelling.  Skin:  Negative for pallor and rash.  Neurological:  Negative for syncope and headaches.  Psychiatric/Behavioral:  Negative for agitation and confusion.      Physical Exam Vital Signs  I have reviewed the triage vital signs BP 132/70   Pulse 67   Temp 98.9 F (37.2 C) (Oral)   Resp 18   Ht 5\' 2"  (1.575 m)   Wt 89.4 kg   SpO2 95%   BMI 36.03 kg/m  Physical Exam Vitals and nursing note reviewed.  Constitutional:      General: She is not in acute distress.    Appearance: Normal appearance. She is obese.  HENT:     Head: Normocephalic and atraumatic.     Right Ear: External ear normal.     Left Ear: External ear normal.     Nose: Nose normal.     Mouth/Throat:     Mouth: Mucous membranes are moist.  Eyes:     General: No scleral icterus.       Right eye: No discharge.        Left eye: No discharge.  Cardiovascular:     Rate and Rhythm: Normal rate and regular rhythm.     Pulses: Normal pulses.     Heart sounds: Normal heart sounds.  Pulmonary:     Effort: Pulmonary effort is normal. No tachypnea, accessory muscle usage or respiratory distress.     Breath sounds: Normal breath sounds.  Abdominal:     General: Abdomen is flat.     Palpations: Abdomen is soft.     Tenderness: There is abdominal tenderness in the periumbilical area. There is no guarding or rebound.     Comments: Not peritoneal  Musculoskeletal:        General: Normal range of motion.     Cervical back: Normal range of motion.     Right lower leg: No edema.  Left lower leg: Edema (pedal 1+) present.  Skin:    General: Skin is warm and dry.     Capillary Refill: Capillary refill takes less than 2 seconds.  Neurological:     Mental Status: She is alert.  Psychiatric:        Mood and Affect: Mood normal.        Behavior: Behavior normal.     ED Results and Treatments Labs (all labs ordered are listed, but only abnormal results are displayed) Labs Reviewed  COMPREHENSIVE METABOLIC PANEL - Abnormal; Notable for the following components:      Result Value   Potassium 3.1 (*)    Glucose, Bld 133 (*)    BUN 5 (*)    Calcium 8.7 (*)    Albumin 3.4  (*)    AST 57 (*)    All other components within normal limits  URINALYSIS, ROUTINE W REFLEX MICROSCOPIC - Abnormal; Notable for the following components:   Color, Urine STRAW (*)    Specific Gravity, Urine 1.002 (*)    Hgb urine dipstick SMALL (*)    Bacteria, UA RARE (*)    All other components within normal limits  LIPASE, BLOOD  CBC  BRAIN NATRIURETIC PEPTIDE  TROPONIN I (HIGH SENSITIVITY)                                                                                                                          Radiology CT ABDOMEN PELVIS W CONTRAST  Result Date: 11/09/2022 CLINICAL DATA:  Abdominal pain, acute, nonlocalized periumbilical, hx gastric bypass EXAM: CT ABDOMEN AND PELVIS WITH CONTRAST TECHNIQUE: Multidetector CT imaging of the abdomen and pelvis was performed using the standard protocol following bolus administration of intravenous contrast. RADIATION DOSE REDUCTION: This exam was performed according to the departmental dose-optimization program which includes automated exposure control, adjustment of the mA and/or kV according to patient size and/or use of iterative reconstruction technique. CONTRAST:  124mL OMNIPAQUE IOHEXOL 350 MG/ML SOLN COMPARISON:  12/11/2020 FINDINGS: Lower chest: See today's chest CT report Hepatobiliary: No focal liver abnormality is seen. Status post cholecystectomy. No biliary dilatation. Pancreas: Slight stranding adjacent to the pancreatic head. Remainder of the pancreas unremarkable. No ductal dilatation. Spleen: No focal abnormality.  Normal size. Adrenals/Urinary Tract: 1.4 cm cyst in the upper pole of the left kidney, stable. No follow-up imaging recommended. No stones or hydronephrosis. Adrenal glands and urinary bladder unremarkable. Stomach/Bowel: Stomach, large and small bowel grossly unremarkable. Postoperative changes in the stomach from prior gastric sleeve. Vascular/Lymphatic: Aortic atherosclerosis. No evidence of aneurysm or adenopathy.  Reproductive: Prior hysterectomy.  No adnexal masses. Other: No free fluid or free air. Musculoskeletal: No acute bony abnormality. IMPRESSION: Slight stranding noted in the region of the pancreatic head which could reflect early acute focal pancreatitis. Recommend clinical correlation and correlation with laboratory values. Electronically Signed   By: Rolm Baptise M.D.   On: 11/09/2022 22:31   CT Angio Chest PE W and/or Wo Contrast  Result Date: 11/09/2022 CLINICAL DATA:  Pulmonary embolism (PE) suspected, low to intermediate prob, positive D-dimer EXAM: CT ANGIOGRAPHY CHEST WITH CONTRAST TECHNIQUE: Multidetector CT imaging of the chest was performed using the standard protocol during bolus administration of intravenous contrast. Multiplanar CT image reconstructions and MIPs were obtained to evaluate the vascular anatomy. RADIATION DOSE REDUCTION: This exam was performed according to the departmental dose-optimization program which includes automated exposure control, adjustment of the mA and/or kV according to patient size and/or use of iterative reconstruction technique. CONTRAST:  128mL OMNIPAQUE IOHEXOL 350 MG/ML SOLN COMPARISON:  03/05/2021 FINDINGS: Cardiovascular: No filling defects in the pulmonary arteries to suggest pulmonary emboli. Heart is normal size. Aorta is normal caliber. Scattered coronary artery and aortic calcifications. Mediastinum/Nodes: No mediastinal, hilar, or axillary adenopathy. Trachea and esophagus are unremarkable. Thyroid unremarkable. Lungs/Pleura: Areas of scarring in the apices/upper lobes as well as lung bases, stable since prior study. No acute confluent opacities or effusions. Upper Abdomen: No acute findings Musculoskeletal: Chest wall soft tissues are unremarkable. No acute bony abnormality. Review of the MIP images confirms the above findings. IMPRESSION: No evidence of pulmonary embolus. Coronary artery disease. Areas of scarring in the lungs bilaterally. No acute  cardiopulmonary disease. Aortic Atherosclerosis (ICD10-I70.0). Electronically Signed   By: Rolm Baptise M.D.   On: 11/09/2022 22:28    Pertinent labs & imaging results that were available during my care of the patient were reviewed by me and considered in my medical decision making (see MDM for details).  Medications Ordered in ED Medications  acetaminophen (TYLENOL) tablet 1,000 mg (has no administration in time range)  HYDROmorphone (DILAUDID) injection 1 mg (1 mg Intravenous Given 11/09/22 2125)  ondansetron (ZOFRAN) injection 4 mg (4 mg Intravenous Given 11/09/22 2126)  famotidine (PEPCID) IVPB 20 mg premix (0 mg Intravenous Stopped 11/09/22 2230)  sodium chloride 0.9 % bolus 1,000 mL (1,000 mLs Intravenous New Bag/Given 11/09/22 2122)  iohexol (OMNIPAQUE) 350 MG/ML injection 100 mL (100 mLs Intravenous Contrast Given 11/09/22 2201)  potassium chloride SA (KLOR-CON M) CR tablet 40 mEq (40 mEq Oral Given 11/09/22 2255)  ketorolac (TORADOL) 15 MG/ML injection 15 mg (15 mg Intravenous Given 11/09/22 2254)  pantoprazole (PROTONIX) injection 40 mg (40 mg Intravenous Given 11/09/22 2254)                                                                                                                                     Procedures Procedures  (including critical care time)  Medical Decision Making / ED Course    Medical Decision Making:    Tailynn Syverson is a 62 y.o. female with past medical history as below, significant for pulmonary sarcoidosis, obesity, hypertension, GERD, HLD who presents to the ED with complaint of abd pain, dyspnea, leg swelling.. The complaint involves an extensive differential diagnosis and also carries with it a high risk of complications and morbidity.  Serious etiology was considered. Ddx includes  but is not limited to: Differential diagnosis includes but is not exclusive to ectopic pregnancy, ovarian cyst, ovarian torsion, acute appendicitis, urinary tract infection,  endometriosis, bowel obstruction, hernia, colitis, renal colic, gastroenteritis, volvulus etc. In my evaluation of this patient's dyspnea my DDx includes, but is not limited to, pneumonia, pulmonary embolism, pneumothorax, pulmonary edema, metabolic acidosis, asthma, COPD, cardiac cause, anemia, anxiety, etc.    Complete initial physical exam performed, notably the patient  was abdomen is soft, nonperitoneal, vital signs stable, no hypoxia.    Reviewed and confirmed nursing documentation for past medical history, family history, social history.  Vital signs reviewed.    Clinical Course as of 11/09/22 2336  Sat Nov 09, 2022  2241 CT with possible early pancreatitis, lipase not elevated, liver enzymes are stable.  Imaging otherwise unremarkable.  Pain improved.  She is tolerant p.o. intake [SG]    Clinical Course User Index [SG] Jeanell Sparrow, DO   Labs reviewed are stable, CT imaging reviewed and stable.  She is feeling much better on recheck, able tolerate p.o. intake.  Abdomen is soft, nonperitoneal. DIB resolved with improvement to her pain Questionable mild early acute pancreatitis, she does drink alcohol and use tobacco products.  Obesity. No  Gallstones on CT.  Liver enzymes and lipase are stable. Gastritis is also possible concern Recommend supportive care at home, will give tramadol short course for possible early pancreatitis Follow-up with GI, follow-up PCP  The patient improved significantly and was discharged in stable condition. Detailed discussions were had with the patient regarding current findings, and need for close f/u with PCP or on call doctor. The patient has been instructed to return immediately if the symptoms worsen in any way for re-evaluation. Patient verbalized understanding and is in agreement with current care plan. All questions answered prior to discharge.     Additional history obtained: -Additional history obtained from spouse -External records from  outside source obtained and reviewed including: Chart review including previous notes, labs, imaging, consultation notes including prior ED visits, prior labs and imaging, home medications   Lab Tests: -I ordered, reviewed, and interpreted labs.   The pertinent results include:   Labs Reviewed  COMPREHENSIVE METABOLIC PANEL - Abnormal; Notable for the following components:      Result Value   Potassium 3.1 (*)    Glucose, Bld 133 (*)    BUN 5 (*)    Calcium 8.7 (*)    Albumin 3.4 (*)    AST 57 (*)    All other components within normal limits  URINALYSIS, ROUTINE W REFLEX MICROSCOPIC - Abnormal; Notable for the following components:   Color, Urine STRAW (*)    Specific Gravity, Urine 1.002 (*)    Hgb urine dipstick SMALL (*)    Bacteria, UA RARE (*)    All other components within normal limits  LIPASE, BLOOD  CBC  BRAIN NATRIURETIC PEPTIDE  TROPONIN I (HIGH SENSITIVITY)    Notable for K low, replaced  EKG   EKG Interpretation  Date/Time:  Saturday November 09 2022 20:34:27 EDT Ventricular Rate:  66 PR Interval:  198 QRS Duration: 74 QT Interval:  426 QTC Calculation: 446 R Axis:   61 Text Interpretation: Normal sinus rhythm Low voltage QRS Borderline ECG When compared with ECG of 16-Mar-2020 10:54, No significant change was found similar to prior no stemi Confirmed by [name] ([id number]) on 11/09/2022 9:11:19 PM         Imaging Studies ordered: I ordered imaging studies including  ctpe ctap I independently visualized the following imaging with scope of interpretation limited to determining acute life threatening conditions related to emergency care; findings noted above, significant for ?pancreatitis  I independently visualized and interpreted imaging. I agree with the radiologist interpretation   Medicines ordered and prescription drug management: Meds ordered this encounter  Medications   HYDROmorphone (DILAUDID) injection 1 mg   ondansetron (ZOFRAN) injection 4 mg    famotidine (PEPCID) IVPB 20 mg premix   sodium chloride 0.9 % bolus 1,000 mL   iohexol (OMNIPAQUE) 350 MG/ML injection 100 mL   potassium chloride SA (KLOR-CON M) CR tablet 40 mEq   ketorolac (TORADOL) 15 MG/ML injection 15 mg   pantoprazole (PROTONIX) injection 40 mg   acetaminophen (TYLENOL) tablet 1,000 mg   traMADol (ULTRAM) 50 MG tablet    Sig: Take 1 tablet (50 mg total) by mouth every 6 (six) hours as needed.    Dispense:  15 tablet    Refill:  0   acetaminophen (TYLENOL) 325 MG tablet    Sig: Take 2 tablets (650 mg total) by mouth every 6 (six) hours as needed.    Dispense:  36 tablet    Refill:  0   sucralfate (CARAFATE) 1 g tablet    Sig: Take 1 tablet (1 g total) by mouth with breakfast, with lunch, and with evening meal for 7 days.    Dispense:  21 tablet    Refill:  0   pantoprazole (PROTONIX) 20 MG tablet    Sig: Take 1 tablet (20 mg total) by mouth daily for 14 days.    Dispense:  14 tablet    Refill:  0    -I have reviewed the patients home medicines and have made adjustments as needed   Consultations Obtained: na   Cardiac Monitoring: The patient was maintained on a cardiac monitor.  I personally viewed and interpreted the cardiac monitored which showed an underlying rhythm of: SR  Social Determinants of Health:  Diagnosis or treatment significantly limited by social determinants of health: former smoker, obesity, and alcohol use   Reevaluation: After the interventions noted above, I reevaluated the patient and found that they have improved  Co morbidities that complicate the patient evaluation  Past Medical History:  Diagnosis Date   Anemia    Anxiety    Arthritis    back, hands , knees    Chest pain 2010   due to sarcoidosis- per pt.    Chronic pain    Depression    GERD (gastroesophageal reflux disease)    Hypercholesterolemia    Hypertension    Pneumonia    sarcoidosis- in remission    Pulmonary sarcoidosis ([place])    Treatment with  Prednisone      Dispostion: Disposition decision including need for hospitalization was considered, and patient discharged from emergency department.    Final Clinical Impression(s) / ED Diagnoses Final diagnoses:  Acute pancreatitis, unspecified complication status, unspecified pancreatitis type  Epigastric pain     This chart was dictated using voice recognition software.  Despite best efforts to proofread,  errors can occur which can change the documentation meaning.    Jeanell Sparrow, DO 11/09/22 2336

## 2022-11-09 NOTE — ED Triage Notes (Signed)
Patient arrives with complaints of abdominal pain for 2 days. Patient is stabbing and sharp with some nausea. Patient states she has never had pain like this before. History of gastric bypass 3 years ago performed at [facility]. No changes in bowel movements or urine.

## 2022-11-09 NOTE — ED Notes (Signed)
Pt desaturated to 84% on RA after dilaudid.  Pt arousable, placed on 2L [place] at this time.   No distress, pt resting more comfortable

## 2022-11-09 NOTE — ED Notes (Signed)
Pt ambulated to the restroom and back to the bed, pt was out of breathe. Checked pts O2 ans pt was on 88%, after a minute pt was at 94%.  Placed nasal canula back on pt. Pt resting at this time

## 2022-11-09 NOTE — ED Notes (Signed)
Patient transported to CT 

## 2022-11-12 ENCOUNTER — Telehealth: Payer: Self-pay

## 2022-11-12 NOTE — Telephone Encounter (Signed)
     Patient  visit on 11/09/2022  at [facility] was for Abdominal Pain.  Have you been able to follow up with your primary care physician? Yes  The patient was or was not able to obtain any needed medicine or equipment. Patient was able to obtain medication.  Are there diet recommendations that you are having difficulty following? No  Patient expresses understanding of discharge instructions and education provided has no other needs at this time. Yes   [facility] Care Guide   ??[email or web address]  ?? [id number]   Website: [email or web address]  [email or web address]

## 2022-11-14 ENCOUNTER — Telehealth: Payer: Self-pay | Admitting: Family Medicine

## 2022-11-14 ENCOUNTER — Ambulatory Visit (INDEPENDENT_AMBULATORY_CARE_PROVIDER_SITE_OTHER): Payer: Medicare HMO | Admitting: Family Medicine

## 2022-11-14 ENCOUNTER — Encounter: Payer: Self-pay | Admitting: Family Medicine

## 2022-11-14 VITALS — BP 124/79 | HR 75 | Ht 62.5 in | Wt 209.1 lb

## 2022-11-14 DIAGNOSIS — R1030 Lower abdominal pain, unspecified: Secondary | ICD-10-CM | POA: Diagnosis not present

## 2022-11-14 DIAGNOSIS — E559 Vitamin D deficiency, unspecified: Secondary | ICD-10-CM | POA: Diagnosis not present

## 2022-11-14 DIAGNOSIS — R1010 Upper abdominal pain, unspecified: Secondary | ICD-10-CM | POA: Insufficient documentation

## 2022-11-14 DIAGNOSIS — R1013 Epigastric pain: Secondary | ICD-10-CM | POA: Diagnosis not present

## 2022-11-14 DIAGNOSIS — N3090 Cystitis, unspecified without hematuria: Secondary | ICD-10-CM | POA: Diagnosis not present

## 2022-11-14 DIAGNOSIS — E538 Deficiency of other specified B group vitamins: Secondary | ICD-10-CM | POA: Diagnosis not present

## 2022-11-14 LAB — POCT URINALYSIS DIPSTICK
Bilirubin, UA: NEGATIVE
Blood, UA: NEGATIVE
Glucose, UA: NEGATIVE
Ketones, UA: NEGATIVE
Nitrite, UA: NEGATIVE
Protein, UA: NEGATIVE
Spec Grav, UA: 1.02 (ref 1.010–1.025)
Urobilinogen, UA: 1 E.U./dL
pH, UA: 7 (ref 5.0–8.0)

## 2022-11-14 MED ORDER — SULFAMETHOXAZOLE-TRIMETHOPRIM 800-160 MG PO TABS: 1.0000 | ORAL_TABLET | Freq: Two times a day (BID) | ORAL | 0 refills | Status: AC

## 2022-11-14 MED ORDER — TRAMADOL HCL 50 MG PO TABS: 50.0000 mg | ORAL_TABLET | Freq: Four times a day (QID) | ORAL | 0 refills | Status: DC | PRN

## 2022-11-14 MED ORDER — PANTOPRAZOLE SODIUM 20 MG PO TBEC: 20.0000 mg | DELAYED_RELEASE_TABLET | Freq: Every day | ORAL | 0 refills | Status: DC

## 2022-11-14 MED ORDER — SUCRALFATE 1 G PO TABS: 1.0000 g | ORAL_TABLET | Freq: Three times a day (TID) | ORAL | 0 refills | Status: DC

## 2022-11-14 NOTE — Telephone Encounter (Signed)
Pt called stating phar did not receive the traMADol (ULTRAM) 50 MG tablet . Can you please resend?

## 2022-11-14 NOTE — Progress Notes (Signed)
Established Patient Office Visit  Subjective:  Patient ID: [name], female    DOB: 11-Jul-1961  Age: 62 y.o. MRN: [id number]  CC:  Chief Complaint  Patient presents with   Follow-up    ER f/u from 11/09/22, pt report feeling rough, reports abdominal pain, joints hurting.     HPI [name] is a 62 y.o. female with past medical history of  prpulmonary sarcoidosis, obesity, hypertension, GERD,and  HLD presents for  ED f/u.  Epigastric Pain: the patient was seen in the ED on 11/09/2022 with complaints of periumbilical abdominal Pain, dyspnea, and leg swelling. CT scan of the abdomen showed possible early pancreatitis with stable lipase and liver enzymes. Imaging studies of her chest were negative for pulmonary embolism. The patient was discharged home with Protonix 20 mg daily, sacral fate 1 g 3 times daily, tramadol for abdominal Pain, and Tylenol. She reports compliance with the treatment regimen since discharge from the emergency department. She complains today of suprapubic Pain and denies urgency, frequency, and dysuria. She denies nausea or vomiting. She denies fever or chills.  She complains of joint pain and morning stiffness. She has been having a flare-up for a week. She reports a history of arthritis.  Past Medical History:  Diagnosis Date   Anemia    Anxiety    Arthritis    back, hands , knees    Chest pain 2010   due to sarcoidosis- per pt.    Chronic pain    Depression    GERD (gastroesophageal reflux disease)    Hypercholesterolemia    Hypertension    Pneumonia    sarcoidosis- in remission    Pulmonary sarcoidosis    Treatment with Prednisone    Past Surgical History:  Procedure Laterality Date   ABDOMINAL HYSTERECTOMY     APPENDECTOMY     CHOLECYSTECTOMY     EXCISION MASS ABDOMINAL N/A 03/20/2020   Procedure: Excision of anterior abdominal wall fat necrosis;  Surgeon: Cindra Presume, MD;  Location: [facility];  Service: Plastics;   Laterality: N/A;   HERNIA REPAIR     INCISIONAL HERNIA REPAIR N/A 07/01/2019   Procedure: OPEN INCISIONAL HERNIA REPAIR WITH MESH;  Surgeon: Kinsinger, Arta Bruce, MD;  Location: [facility];  Service: General;  Laterality: N/A;   [place] N/A 02/20/2021   Procedure: LAPAROSCOPIC RECURRENT INCISIONAL HERNIA REPAIR WITH MESH;  Surgeon: Kieth Brightly Arta Bruce, MD;  Location: [facility];  Service: General;  Laterality: N/A;   INSERTION OF MESH N/A 07/01/2019   Procedure: Insertion Of Mesh;  Surgeon: Kinsinger, Arta Bruce, MD;  Location: [facility];  Service: General;  Laterality: N/A;   LAPAROSCOPIC GASTRIC SLEEVE RESECTION N/A 02/15/2019   Procedure: LAPAROSCOPIC GASTRIC SLEEVE RESECTION, UPPER ENDO, ERAS Pathway, Hiatal Hernia Repair;  Surgeon: Kinsinger, Arta Bruce, MD;  Location: [facility];  Service: General;  Laterality: N/A;   LAPAROSCOPY N/A 02/17/2019   Procedure: LAPAROSCOPY DIAGNOSTIC CONVERTED TO OPEN, EVACUATION OF HEMATOMA AND LIGATION OF VESSEL;  Surgeon: Kinsinger, Arta Bruce, MD;  Location: [facility];  Service: General;  Laterality: N/A;   LAPAROSCOPY N/A 07/01/2019   Procedure: Laparoscopy Diagnostic;  Surgeon: Kieth Brightly Arta Bruce, MD;  Location: [facility];  Service: General;  Laterality: N/A;   SMALL INTESTINE SURGERY      Family History  Problem Relation Age of Onset   Hypertension Mother    Hyperlipidemia Mother    Lupus Father    Brain cancer Sister  2020   Heart disease Brother    Heart attack Brother    Kidney disease Maternal Grandmother    Lung cancer Neg Hx     Social History   Socioeconomic History   Marital status: Divorced    Spouse name: Not on file   Number of children: 2   Years of education: 12th   Highest education level: Some college, no degree  Occupational History   Occupation: foster parent  Tobacco Use   Smoking status: Former    Packs/day: 0.20    Years: 30.00    Additional pack years: 0.00    Total pack years: 6.00    Types: Cigarettes     Quit date: 11/2021    Years since quitting: 1.0   Smokeless tobacco: Never   Tobacco comments:    Quit smoking 04/23  Vaping Use   Vaping Use: Never used  Substance and Sexual Activity   Alcohol use: Not Currently    Comment: some   Drug use: No   Sexual activity: Yes    Birth control/protection: Surgical  Other Topics Concern   Not on file  Social History Narrative   does regular exercise. Three times a week   Social Determinants of Health   Financial Resource Strain: Medium Risk (08/14/2021)   Overall Financial Resource Strain (CARDIA)    Difficulty of Paying Living Expenses: Somewhat hard  Food Insecurity: No Food Insecurity (08/14/2021)   Hunger Vital Sign    Worried About Running Out of Food in the Last Year: Never true    Ran Out of Food in the Last Year: Never true  Transportation Needs: No Transportation Needs (08/14/2021)   PRAPARE - Hydrologist (Medical): No    Lack of Transportation (Non-Medical): No  Physical Activity: Sufficiently Active (08/14/2021)   Exercise Vital Sign    Days of Exercise per Week: 3 days    Minutes of Exercise per Session: 50 min  Stress: Stress Concern Present (08/14/2021)   [place]    Feeling of Stress : Rather much  Social Connections: Moderately Isolated (08/14/2021)   Social Connection and Isolation Panel [NHANES]    Frequency of Communication with Friends and Family: More than three times a week    Frequency of Social Gatherings with Friends and Family: Once a week    Attends Religious Services: 1 to 4 times per year    Active Member of [employer] or Organizations: No    Attends Archivist Meetings: Never    Marital Status: Divorced  Human resources officer Violence: Not At Risk (08/14/2021)   Humiliation, Afraid, Rape, and Kick questionnaire    Fear of Current or Ex-Partner: No    Emotionally Abused: No    Physically Abused: No    Sexually  Abused: No    Outpatient Medications Prior to Visit  Medication Sig Dispense Refill   acetaminophen (TYLENOL) 325 MG tablet Take 2 tablets (650 mg total) by mouth every 6 (six) hours as needed. 36 tablet 0   amLODipine (NORVASC) 10 MG tablet Take 1 tablet (10 mg total) by mouth daily. 90 tablet 3   atorvastatin (LIPITOR) 80 MG tablet TAKE ONE TABLET BY MOUTH ONCE DAILY. 90 tablet 0   Blood Pressure Monitoring (BLOOD PRESSURE KIT) KIT Check your blood pressure 3 x a week 1 kit 0   Cholecalciferol (VITAMIN D3) 25 MCG (1000 UT) CAPS Take 1 capsule (1,000 Units total) by mouth daily.  30 capsule 3   Elastic Bandages & Supports (ABDOMINAL BINDER/ELASTIC LARGE) MISC 1 each by Does not apply route daily. 1 each 0   ezetimibe (ZETIA) 10 MG tablet Take 1 tablet (10 mg total) by mouth daily. 90 tablet 3   fenofibrate (TRICOR) 48 MG tablet TAKE ONE TABLET BY MOUTH ONCE DAILY. 90 tablet 0   fluticasone (FLONASE) 50 MCG/ACT nasal spray PLACE 2 SPRAYS INTO BOTH NOSTRILS DAILY. 16 g 0   ibuprofen (ADVIL) 800 MG tablet Take 1 tablet (800 mg total) by mouth every 8 (eight) hours as needed. 30 tablet 0   lisinopril (ZESTRIL) 5 MG tablet Take 1 tablet (5 mg total) by mouth daily. 90 tablet 3   Multiple Vitamins-Minerals (WOMENS 50+ MULTI VITAMIN/MIN PO) Take 1 tablet by mouth daily.     RESTASIS 0.05 % ophthalmic emulsion 1 drop 2 (two) times daily.     pantoprazole (PROTONIX) 20 MG tablet Take 1 tablet (20 mg total) by mouth daily for 14 days. 14 tablet 0   sucralfate (CARAFATE) 1 g tablet Take 1 tablet (1 g total) by mouth with breakfast, with lunch, and with evening meal for 7 days. 21 tablet 0   traMADol (ULTRAM) 50 MG tablet Take 1 tablet (50 mg total) by mouth every 6 (six) hours as needed. 15 tablet 0   omega-3 acid ethyl esters (LOVAZA) 1 g capsule Take 2 capsules (2 g total) by mouth 2 (two) times daily. 360 capsule 0   No facility-administered medications prior to visit.    Allergies  Allergen  Reactions   Aspirin Hives    ROS Review of Systems  Constitutional:  Negative for chills and fever.  Eyes:  Negative for visual disturbance.  Respiratory:  Negative for chest tightness and shortness of breath.   Gastrointestinal:  Positive for abdominal pain. Negative for nausea and vomiting.  Musculoskeletal:  Positive for arthralgias.  Neurological:  Negative for dizziness and headaches.      Objective:    Physical Exam HENT:     Head: Normocephalic.     Mouth/Throat:     Mouth: Mucous membranes are moist.  Cardiovascular:     Rate and Rhythm: Normal rate.     Heart sounds: Normal heart sounds.  Pulmonary:     Effort: Pulmonary effort is normal.     Breath sounds: Normal breath sounds.  Abdominal:     General: Bowel sounds are normal.     Tenderness: There is abdominal tenderness in the suprapubic area.  Neurological:     Mental Status: She is alert.     BP 124/79   Pulse 75   Ht 5' 2.5" (1.588 m)   Wt 209 lb 1.3 oz (94.8 kg)   SpO2 92%   BMI 37.63 kg/m  Wt Readings from Last 3 Encounters:  11/14/22 209 lb 1.3 oz (94.8 kg)  11/09/22 197 lb (89.4 kg)  09/09/22 208 lb 1.9 oz (94.4 kg)    Lab Results  Component Value Date   TSH 3.740 10/11/2022   Lab Results  Component Value Date   WBC 9.3 11/09/2022   HGB 13.6 11/09/2022   HCT 39.3 11/09/2022   MCV 89.9 11/09/2022   PLT 321 11/09/2022   Lab Results  Component Value Date   NA 138 11/09/2022   K 3.1 (L) 11/09/2022   CO2 24 11/09/2022   GLUCOSE 133 (H) 11/09/2022   BUN 5 (L) 11/09/2022   CREATININE 0.64 11/09/2022   BILITOT 0.5 11/09/2022   ALKPHOS  77 11/09/2022   AST 57 (H) 11/09/2022   ALT 23 11/09/2022   PROT 8.0 11/09/2022   ALBUMIN 3.4 (L) 11/09/2022   CALCIUM 8.7 (L) 11/09/2022   ANIONGAP 11 11/09/2022   EGFR 95 10/11/2022   Lab Results  Component Value Date   CHOL 201 (H) 10/11/2022   Lab Results  Component Value Date   HDL 40 10/11/2022   Lab Results  Component Value Date    LDLCALC 116 (H) 10/11/2022   Lab Results  Component Value Date   TRIG 258 (H) 10/11/2022   Lab Results  Component Value Date   CHOLHDL 5.0 (H) 10/11/2022   Lab Results  Component Value Date   HGBA1C 5.6 10/11/2022      Assessment & Plan:  Epigastric pain Assessment & Plan: Labs and imaging reviewed UA shows a trace of leukocytes Will treat today with bactrim Refilled Protonix, Carafate and Tramadol Pending labs   Cystitis -     Sulfamethoxazole-Trimethoprim; Take 1 tablet by mouth 2 (two) times daily for 5 days.  Dispense: 10 tablet; Refill: 0  Lower abdominal pain -     POCT urinalysis dipstick -     Pantoprazole Sodium; Take 1 tablet (20 mg total) by mouth daily for 14 days.  Dispense: 14 tablet; Refill: 0 -     Sucralfate; Take 1 tablet (1 g total) by mouth with breakfast, with lunch, and with evening meal for 20 days.  Dispense: 60 tablet; Refill: 0 -     Amylase -     Lipase -     CBC with Differential/Platelet -     traMADol HCl; Take 1 tablet (50 mg total) by mouth every 6 (six) hours as needed.  Dispense: 15 tablet; Refill: 0  Vitamin B12 deficiency -     Vitamin B12  Vitamin D deficiency -     VITAMIN D 25 Hydroxy (Vit-D Deficiency, Fractures)    Follow-up: Return in about 3 months (around 02/13/2023).   Alvira Monday, FNP

## 2022-11-14 NOTE — Assessment & Plan Note (Signed)
Labs and imaging reviewed UA shows a trace of leukocytes Will treat today with bactrim Refilled Protonix, Carafate and Tramadol Pending labs

## 2022-11-14 NOTE — Patient Instructions (Addendum)
I appreciate the opportunity to provide care to you today!    Follow up:  3 months  Labs: please stop by the lab today to get your blood drawn (CBC, Lipase, Amylase, Vit B12 and  Vit D)  Fatigue We will assess your Vit B12 and Vit D levels today I recommend taking Vit B12 1000 mcg OTC   Abdominal pain I've refilled your Protonix and Carafate; please continue taking  Joint pain Likely due to your arthritis Please continue taking tramadol 50 mg   Please continue to a heart-healthy diet and increase your physical activities. Try to exercise for 1mins at least five days a week.      It was a pleasure to see you and I look forward to continuing to work together on your health and well-being. Please do not hesitate to call the office if you need care or have questions about your care.   Have a wonderful day and week. With Gratitude, Alvira Monday MSN, FNP-BC

## 2022-11-15 LAB — CBC WITH DIFFERENTIAL/PLATELET
Basophils Absolute: 0 10*3/uL (ref 0.0–0.2)
Basos: 1 %
EOS (ABSOLUTE): 0.1 10*3/uL (ref 0.0–0.4)
Eos: 2 %
Hematocrit: 41.8 % (ref 34.0–46.6)
Hemoglobin: 13.9 g/dL (ref 11.1–15.9)
Immature Grans (Abs): 0 10*3/uL (ref 0.0–0.1)
Immature Granulocytes: 0 %
Lymphocytes Absolute: 1.1 10*3/uL (ref 0.7–3.1)
Lymphs: 18 %
MCH: 30.8 pg (ref 26.6–33.0)
MCHC: 33.3 g/dL (ref 31.5–35.7)
MCV: 93 fL (ref 79–97)
Monocytes Absolute: 0.5 10*3/uL (ref 0.1–0.9)
Monocytes: 9 %
Neutrophils Absolute: 4.2 10*3/uL (ref 1.4–7.0)
Neutrophils: 70 %
Platelets: 331 10*3/uL (ref 150–450)
RBC: 4.51 x10E6/uL (ref 3.77–5.28)
RDW: 13.1 % (ref 11.7–15.4)
WBC: 6 10*3/uL (ref 3.4–10.8)

## 2022-11-15 LAB — LIPASE: Lipase: 28 U/L (ref 14–72)

## 2022-11-15 LAB — AMYLASE: Amylase: 50 U/L (ref 31–110)

## 2022-11-15 LAB — VITAMIN B12: Vitamin B-12: 577 pg/mL (ref 232–1245)

## 2022-11-15 LAB — VITAMIN D 25 HYDROXY (VIT D DEFICIENCY, FRACTURES): Vit D, 25-Hydroxy: 36.7 ng/mL (ref 30.0–100.0)

## 2022-11-15 NOTE — Progress Notes (Signed)
Kindly inform the patient that her labs are stable

## 2022-11-18 ENCOUNTER — Other Ambulatory Visit: Payer: Self-pay | Admitting: Nurse Practitioner

## 2022-12-09 ENCOUNTER — Telehealth: Payer: Self-pay | Admitting: Family Medicine

## 2022-12-09 NOTE — Telephone Encounter (Signed)
Contacted [name] to schedule their annual wellness visit. Appointment made for 12/25/2022.  Thank you,  Judeth Cornfield,  AMB Clinical Support [facility] Program Direct Dial ??[phone]

## 2022-12-14 ENCOUNTER — Other Ambulatory Visit: Payer: Self-pay | Admitting: Family Medicine

## 2022-12-14 DIAGNOSIS — E7849 Other hyperlipidemia: Secondary | ICD-10-CM

## 2022-12-25 ENCOUNTER — Telehealth: Payer: Self-pay

## 2022-12-25 ENCOUNTER — Ambulatory Visit (INDEPENDENT_AMBULATORY_CARE_PROVIDER_SITE_OTHER): Payer: Medicare HMO

## 2022-12-25 VITALS — BP 132/70 | Ht 62.5 in | Wt 209.0 lb

## 2022-12-25 DIAGNOSIS — Z0000 Encounter for general adult medical examination without abnormal findings: Secondary | ICD-10-CM

## 2022-12-25 DIAGNOSIS — Z1211 Encounter for screening for malignant neoplasm of colon: Secondary | ICD-10-CM

## 2022-12-25 DIAGNOSIS — Z1231 Encounter for screening mammogram for malignant neoplasm of breast: Secondary | ICD-10-CM

## 2022-12-25 NOTE — Progress Notes (Signed)
I connected with  [name] on 12/25/22 by a audio enabled telemedicine application and verified that I am speaking with the correct person using two identifiers.  Patient Location: Home  Provider Location: Home Office  I discussed the limitations of evaluation and management by telemedicine. The patient expressed understanding and agreed to proceed.  Subjective:   [name] is a 62 y.o. female who presents for Medicare Annual (Subsequent) preventive examination.  Review of Systems     Cardiac Risk Factors include: dyslipidemia;hypertension;obesity (BMI >30kg/m2);sedentary lifestyle;Other (see comment), Risk factor comments: Pulmonary Sarcoidosis     Objective:    Today's Vitals   12/25/22 0903 12/25/22 0904  BP: 132/70   Weight: 209 lb (94.8 kg)   Height: 5' 2.5" (1.588 m)   PainSc: 8  8   PainLoc: Generalized    Body mass index is 37.62 kg/m.     12/25/2022    9:10 AM 11/09/2022    8:30 PM 09/09/2022   12:52 AM 08/20/2022    9:20 AM 08/14/2021    8:20 AM 02/22/2021    3:00 PM 02/13/2021    1:42 PM  Advanced Directives  Does Patient Have a Medical Advance Directive? No No No No No No No  Would patient like information on creating a medical advance directive? No - Patient declined  No - Patient declined Yes (Inpatient - patient defers creating a medical advance directive at this time - Information given) Yes (ED - Information included in AVS) No - Patient declined     Current Medications (verified) Outpatient Encounter Medications as of 12/25/2022  Medication Sig   amLODipine (NORVASC) 10 MG tablet TAKE ONE TABLET BY MOUTH ONCE DAILY.   atorvastatin (LIPITOR) 80 MG tablet TAKE ONE TABLET BY MOUTH ONCE DAILY.   Blood Pressure Monitoring (BLOOD PRESSURE KIT) KIT Check your blood pressure 3 x a week   Cholecalciferol (VITAMIN D3) 25 MCG (1000 UT) CAPS Take 1 capsule (1,000 Units total) by mouth daily.   ezetimibe (ZETIA) 10 MG tablet Take 1 tablet (10 mg total) by  mouth daily.   fenofibrate (TRICOR) 48 MG tablet TAKE ONE TABLET BY MOUTH ONCE DAILY.   fluticasone (FLONASE) 50 MCG/ACT nasal spray PLACE 2 SPRAYS INTO BOTH NOSTRILS DAILY.   lisinopril (ZESTRIL) 5 MG tablet Take 1 tablet (5 mg total) by mouth daily.   omega-3 acid ethyl esters (LOVAZA) 1 g capsule Take 2 capsules (2 g total) by mouth 2 (two) times daily.   RESTASIS 0.05 % ophthalmic emulsion 1 drop 2 (two) times daily.   sucralfate (CARAFATE) 1 g tablet Take 1 tablet (1 g total) by mouth with breakfast, with lunch, and with evening meal for 20 days.   traMADol (ULTRAM) 50 MG tablet Take 1 tablet (50 mg total) by mouth every 6 (six) hours as needed.   acetaminophen (TYLENOL) 325 MG tablet Take 2 tablets (650 mg total) by mouth every 6 (six) hours as needed.   Elastic Bandages & Supports (ABDOMINAL BINDER/ELASTIC LARGE) MISC 1 each by Does not apply route daily. (Patient not taking: Reported on 12/25/2022)   ibuprofen (ADVIL) 800 MG tablet Take 1 tablet (800 mg total) by mouth every 8 (eight) hours as needed. (Patient not taking: Reported on 12/25/2022)   Multiple Vitamins-Minerals (WOMENS 50+ MULTI VITAMIN/MIN PO) Take 1 tablet by mouth daily.   pantoprazole (PROTONIX) 20 MG tablet Take 1 tablet (20 mg total) by mouth daily for 14 days. (Patient not taking: Reported on 12/25/2022)   No facility-administered  encounter medications on file as of 12/25/2022.    Allergies (verified) Aspirin   History: Past Medical History:  Diagnosis Date   Anemia    Anxiety    Arthritis    back, hands , knees    Chest pain 2010   due to sarcoidosis- per pt.    Chronic pain    Depression    GERD (gastroesophageal reflux disease)    Hypercholesterolemia    Hypertension    Pneumonia    sarcoidosis- in remission    Pulmonary sarcoidosis (HCC)    Treatment with Prednisone   Past Surgical History:  Procedure Laterality Date   ABDOMINAL HYSTERECTOMY     APPENDECTOMY     CHOLECYSTECTOMY     EXCISION MASS  ABDOMINAL N/A 03/20/2020   Procedure: Excision of anterior abdominal wall fat necrosis;  Surgeon: Allena Napoleon, MD;  Location: [facility];  Service: Plastics;  Laterality: N/A;   HERNIA REPAIR     INCISIONAL HERNIA REPAIR N/A 07/01/2019   Procedure: OPEN INCISIONAL HERNIA REPAIR WITH MESH;  Surgeon: Kinsinger, De Blanch, MD;  Location: [facility];  Service: General;  Laterality: N/A;   INCISIONAL HERNIA REPAIR N/A 02/20/2021   Procedure: LAPAROSCOPIC RECURRENT INCISIONAL HERNIA REPAIR WITH MESH;  Surgeon: Sheliah Hatch De Blanch, MD;  Location: [facility];  Service: General;  Laterality: N/A;   INSERTION OF MESH N/A 07/01/2019   Procedure: Insertion Of Mesh;  Surgeon: Kinsinger, De Blanch, MD;  Location: [facility] OR;  Service: General;  Laterality: N/A;   LAPAROSCOPIC GASTRIC SLEEVE RESECTION N/A 02/15/2019   Procedure: LAPAROSCOPIC GASTRIC SLEEVE RESECTION, UPPER ENDO, ERAS Pathway, Hiatal Hernia Repair;  Surgeon: Kinsinger, De Blanch, MD;  Location: [facility];  Service: General;  Laterality: N/A;   LAPAROSCOPY N/A 02/17/2019   Procedure: LAPAROSCOPY DIAGNOSTIC CONVERTED TO OPEN, EVACUATION OF HEMATOMA AND LIGATION OF VESSEL;  Surgeon: Kinsinger, De Blanch, MD;  Location: [facility];  Service: General;  Laterality: N/A;   LAPAROSCOPY N/A 07/01/2019   Procedure: Laparoscopy Diagnostic;  Surgeon: Sheliah Hatch, De Blanch, MD;  Location: [facility] OR;  Service: General;  Laterality: N/A;   SMALL INTESTINE SURGERY     Family History  Problem Relation Age of Onset   Hypertension Mother    Hyperlipidemia Mother    Lupus Father    Brain cancer Sister        2020   Heart disease Brother    Heart attack Brother    Kidney disease Maternal Grandmother    Lung cancer Neg Hx    Social History   Socioeconomic History   Marital status: Divorced    Spouse name: Not on file   Number of children: 2   Years of education: 12th   Highest education level: Some college, no degree  Occupational History   Occupation:  foster parent  Tobacco Use   Smoking status: Former    Packs/day: 0.20    Years: 30.00    Additional pack years: 0.00    Total pack years: 6.00    Types: Cigarettes    Quit date: 11/2021    Years since quitting: 1.1   Smokeless tobacco: Never   Tobacco comments:    Quit smoking 04/23  Vaping Use   Vaping Use: Never used  Substance and Sexual Activity   Alcohol use: Not Currently    Comment: some   Drug use: No   Sexual activity: Yes    Birth control/protection: Surgical  Other Topics Concern   Not on file  Social History  Narrative   does regular exercise. Three times a week   Social Determinants of Health   Financial Resource Strain: Low Risk  (12/25/2022)   Overall Financial Resource Strain (CARDIA)    Difficulty of Paying Living Expenses: Not hard at all  Food Insecurity: No Food Insecurity (12/25/2022)   Hunger Vital Sign    Worried About Running Out of Food in the Last Year: Never true    Ran Out of Food in the Last Year: Never true  Transportation Needs: No Transportation Needs (12/25/2022)   PRAPARE - Administrator, Civil Service (Medical): No    Lack of Transportation (Non-Medical): No  Physical Activity: Insufficiently Active (12/25/2022)   Exercise Vital Sign    Days of Exercise per Week: 3 days    Minutes of Exercise per Session: 20 min  Stress: No Stress Concern Present (12/25/2022)   [employer] of Occupational Health - Occupational Stress Questionnaire    Feeling of Stress : Not at all  Social Connections: Socially Integrated (12/25/2022)   Social Connection and Isolation Panel [NHANES]    Frequency of Communication with Friends and Family: More than three times a week    Frequency of Social Gatherings with Friends and Family: More than three times a week    Attends Religious Services: More than 4 times per year    Active Member of [employer] or Organizations: Yes    Attends Engineer, structural: More than 4 times per year    Marital  Status: Married    Tobacco Counseling Counseling given: Yes Tobacco comments: Quit smoking 04/23   Clinical Intake:  Pre-visit preparation completed: Yes  Pain : 0-10 Pain Score: 8  Pain Type: Chronic pain Pain Location: Back (lower back, knees, shoulders) Pain Orientation: Lower Pain Descriptors / Indicators: Constant Pain Onset: More than a month ago Pain Frequency: Constant Effect of Pain on Daily Activities: yes     BMI - recorded: 37.62 Nutritional Status: BMI > 30  Obese Nutritional Risks: None  How often do you need to have someone help you when you read instructions, pamphlets, or other written materials from your doctor or pharmacy?: 1 - Never  Diabetic?No  Interpreter Needed?: No  Information entered by :: Abby Jomel Whittlesey, CMA   Activities of Daily Living    12/25/2022    9:12 AM 08/20/2022    9:22 AM  In your present state of health, do you have any difficulty performing the following activities:  Hearing? 0 0  Vision? 1 0  Comment wears reading glasses   Difficulty concentrating or making decisions? 0 0  Walking or climbing stairs? 0 0  Dressing or bathing? 0   Doing errands, shopping? 0 0  Preparing Food and eating ? N   Using the Toilet? N   In the past six months, have you accidently leaked urine? N   Do you have problems with loss of bowel control? N   Managing your Medications? N   Managing your Finances? N   Housekeeping or managing your Housekeeping? N     Patient Care Team: Gilmore Laroche, FNP as PCP - General (Family Medicine) Jena Gauss Gerrit Friends, MD (Gastroenterology)  Indicate any recent Medical Services you may have received from other than [facility] providers in the past year (date may be approximate).     Assessment:   This is a routine wellness examination for [place].  Hearing/Vision screen Hearing Screening - Comments:: Patient denies any hearing difficulties.   Vision Screening - Comments::  Patient wears eyeglasses and is UTD  with yearly eye exam.    Dietary issues and exercise activities discussed: Current Exercise Habits: Home exercise routine, Type of exercise: walking, Time (Minutes): 30, Frequency (Times/Week): 3, Weekly Exercise (Minutes/Week): 90, Intensity: Mild, Exercise limited by: Other - see comments;respiratory conditions(s) (pulmonary sarcoidosis)   Goals Addressed               This Visit's Progress     Patient Stated (pt-stated)        Patient states she wants to drink more water and get out and be more active.        Depression Screen    12/25/2022    9:11 AM 11/14/2022    8:24 AM 08/29/2022    8:59 AM 08/20/2022    9:22 AM 05/29/2022    9:05 AM 03/28/2022    8:31 AM 02/06/2022    9:16 AM  PHQ 2/9 Scores  PHQ - 2 Score 0 0 0 0 0 0 0  PHQ- 9 Score 0 4 3        Fall Risk    12/25/2022    9:11 AM 11/14/2022    8:24 AM 08/29/2022    8:59 AM 08/20/2022    9:22 AM 05/29/2022    9:04 AM  Fall Risk   Falls in the past year? 0 0 0 0 0  Number falls in past yr: 0 0 0 0 0  Injury with Fall? 0 0 0 0 0  Risk for fall due to : No Fall Risks No Fall Risks No Fall Risks  No Fall Risks  Follow up Falls prevention discussed Falls evaluation completed Falls evaluation completed  Falls evaluation completed    FALL RISK PREVENTION PERTAINING TO THE HOME:  Any stairs in or around the home? No  If so, are there any without handrails? No  Home free of loose throw rugs in walkways, pet beds, electrical cords, etc? Yes  Adequate lighting in your home to reduce risk of falls? Yes   ASSISTIVE DEVICES UTILIZED TO PREVENT FALLS:  Life alert? No  Use of a cane, walker or w/c? No  Grab bars in the bathroom? No  Shower chair or bench in shower? No  Elevated toilet seat or a handicapped toilet? No   TIMED UP AND GO:  Was the test performed? No . Telephone visit  Cognitive Function:        12/25/2022    9:13 AM 08/20/2022    9:23 AM 08/14/2021    8:23 AM  6CIT Screen  What Year? 0 points  0 points   What month? 0 points  0 points  What time? 0 points 0 points 0 points  Count back from 20 0 points 0 points 0 points  Months in reverse 0 points 4 points 4 points  Repeat phrase 0 points 0 points 0 points  Total Score 0 points  4 points    Immunizations Immunization History  Administered Date(s) Administered   Influenza,inj,Quad PF,6+ Mos 07/18/2019, 05/01/2021, 05/29/2022   Influenza-Unspecified 06/10/2013, 05/16/2014, 05/19/2015, 04/29/2016, 05/21/2017, 07/23/2018   Moderna SARS-COV2 Booster Vaccination 06/15/2021   Moderna Sars-Covid-2 Vaccination 11/03/2019, 11/24/2019, 12/15/2020   Pneumococcal Polysaccharide-23 06/10/2013    TDAP status: Due, Education has been provided regarding the importance of this vaccine. Advised may receive this vaccine at local pharmacy or Health Dept. Aware to provide a copy of the vaccination record if obtained from local pharmacy or Health Dept. Verbalized acceptance and understanding.  Flu Vaccine  status: Up to date  Pneumococcal Vaccine: Patient does not qualify for this vaccine  Covid-19 vaccine status: Completed vaccines  Qualifies for Shingles Vaccine? No   Zostavax completed No   Shingrix Completed?: No.    Education has been provided regarding the importance of this vaccine. Patient has been advised to call insurance company to determine out of pocket expense if they have not yet received this vaccine. Advised may also receive vaccine at local pharmacy or Health Dept. Verbalized acceptance and understanding.  Screening Tests Health Maintenance  Topic Date Due   DTaP/Tdap/Td (1 - Tdap) Never done   Zoster Vaccines- Shingrix (1 of 2) Never done   COLONOSCOPY (Pts 45-65yrs Insurance coverage will need to be confirmed)  Never done   INFLUENZA VACCINE  03/13/2023   Medicare Annual Wellness (AWV)  12/25/2023   MAMMOGRAM  01/03/2024   Hepatitis C Screening  Completed   HIV Screening  Completed   HPV VACCINES  Aged Out   PAP SMEAR-Modifier   Discontinued   COVID-19 Vaccine  Discontinued    Health Maintenance  Health Maintenance Due  Topic Date Due   DTaP/Tdap/Td (1 - Tdap) Never done   Zoster Vaccines- Shingrix (1 of 2) Never done   COLONOSCOPY (Pts 45-69yrs Insurance coverage will need to be confirmed)  Never done    Colorectal cancer screening: Type of screening: Cologuard. Completed Ordered 12/25/2022. Repeat every   years  Mammogram status: Ordered 12/25/2022. Pt provided with contact info and advised to call to schedule appt.     Lung Cancer Screening: (Low Dose CT Chest recommended if Age 71-80 years, 30 pack-year currently smoking OR have quit w/in 15years.) does not qualify.     Additional Screening:  Hepatitis C Screening: does qualify; Completed 02/06/2022  Vision Screening: Recommended annual ophthalmology exams for early detection of glaucoma and other disorders of the eye. Is the patient up to date with their annual eye exam?  Yes  Who is the provider or what is the name of the office in which the patient attends annual eye exams? Dr. Montez Morita If pt is not established with a provider, would they like to be referred to a provider to establish care?  established .   Dental Screening: Recommended annual dental exams for proper oral hygiene  Community Resource Referral / Chronic Care Management: CRR required this visit?  No   CCM required this visit?  No      Plan:     I have personally reviewed and noted the following in the patient's chart:   Medical and social history Use of alcohol, tobacco or illicit drugs  Current medications and supplements including opioid prescriptions. Patient is currently taking opioid prescriptions. Information provided to patient regarding non-opioid alternatives. Patient advised to discuss non-opioid treatment plan with their provider. Functional ability and status Nutritional status Physical activity Advanced directives List of other physicians Hospitalizations,  surgeries, and ER visits in previous 12 months Vitals Screenings to include cognitive, depression, and falls Referrals and appointments  In addition, I have reviewed and discussed with patient certain preventive protocols, quality metrics, and best practice recommendations. A written personalized care plan for preventive services as well as general preventive health recommendations were provided to patient.   Patient would like to access their AVS on my-chart     Jordan Hawks Thoams Siefert, CMA   12/25/2022   Nurse Notes: Patient requesting a refill on her tramadol

## 2022-12-25 NOTE — Patient Instructions (Signed)
[name] , Thank you for taking time to come for your Medicare Wellness Visit. I appreciate your ongoing commitment to your health goals. Please review the following plan we discussed and let me know if I can assist you in the future.   These are the goals we discussed:  Goals       Eat Healthy      Timeframe:  Long-Range Goal Priority:  Medium Start Date:                             Expected End Date:                       Follow Up Date 08/12/2022/    - drink 6 to 8 glasses of water each day    Why is this important?   When you are ready to manage your nutrition or weight, having a plan and setting goals will help.  Taking small steps to change how you eat and exercise is a good place to start.    Notes:       Patient Stated (pt-stated)      Patient states she wants to drink more water and get out and be more active.         This is a list of the screening recommended for you and due dates:  Health Maintenance  Topic Date Due   DTaP/Tdap/Td vaccine (1 - Tdap) Never done   Zoster (Shingles) Vaccine (1 of 2) Never done   Colon Cancer Screening  Never done   Flu Shot  03/13/2023   Medicare Annual Wellness Visit  12/25/2023   Mammogram  01/03/2024   Hepatitis C Screening: USPSTF Recommendation to screen - Ages 18-79 yo.  Completed   HIV Screening  Completed   HPV Vaccine  Aged Out   Pap Smear  Discontinued   COVID-19 Vaccine  Discontinued    Advanced directives: Advance directive discussed with you today. Even though you declined this today, please call our office should you change your mind, and we can give you the proper paperwork for you to fill out.   Conditions/risks identified: We have ordered Cologuard and a Mammogram for you today. They will call you to get you scheduled. I will send a message to [facility] regarding your Tramadol  Next appointment: Follow up in one year for your annual wellness visit.   Dec 31, 2023 at 9:00am via telephone  Managing Pain  Without Opioids Opioids are strong medicines used to treat moderate to severe pain. For some people, especially those who have long-term (chronic) pain, opioids may not be the best choice for pain management due to: Side effects like nausea, constipation, and sleepiness. The risk of addiction (opioid use disorder). The longer you take opioids, the greater your risk of addiction. Pain that lasts for more than 3 months is called chronic pain. Managing chronic pain usually requires more than one approach and is often provided by a team of health care providers working together (multidisciplinary approach). Pain management may be done at a pain management center or pain clinic. How to manage pain without the use of opioids Use non-opioid medicines Non-opioid medicines for pain may include: Over-the-counter or prescription non-steroidal anti-inflammatory drugs (NSAIDs). These may be the first medicines used for pain. They work well for muscle and bone pain, and they reduce swelling. Acetaminophen. This over-the-counter medicine may work well for milder pain but not  swelling. Antidepressants. These may be used to treat chronic pain. A certain type of antidepressant (tricyclics) is often used. These medicines are given in lower doses for pain than when used for depression. Anticonvulsants. These are usually used to treat seizures but may also reduce nerve (neuropathic) pain. Muscle relaxants. These relieve pain caused by sudden muscle tightening (spasms). You may also use a pain medicine that is applied to the skin as a patch, cream, or gel (topical analgesic), such as a numbing medicine. These may cause fewer side effects than medicines taken by mouth. Do certain therapies as directed Some therapies can help with pain management. They include: Physical therapy. You will do exercises to gain strength and flexibility. A physical therapist may teach you exercises to move and stretch parts of your body that  are weak, stiff, or painful. You can learn these exercises at physical therapy visits and practice them at home. Physical therapy may also involve: Massage. Heat wraps or applying heat or cold to affected areas. Electrical signals that interrupt pain signals (transcutaneous electrical nerve stimulation, TENS). Weak lasers that reduce pain and swelling (low-level laser therapy). Signals from your body that help you learn to regulate pain (biofeedback). Occupational therapy. This helps you to learn ways to function at home and work with less pain. Recreational therapy. This involves trying new activities or hobbies, such as a physical activity or drawing. Mental health therapy, including: Cognitive behavioral therapy (CBT). This helps you learn coping skills for dealing with pain. Acceptance and commitment therapy (ACT) to change the way you think and react to pain. Relaxation therapies, including muscle relaxation exercises and mindfulness-based stress reduction. Pain management counseling. This may be individual, family, or group counseling.  Receive medical treatments Medical treatments for pain management include: Nerve block injections. These may include a pain blocker and anti-inflammatory medicines. You may have injections: Near the spine to relieve chronic back or neck pain. Into joints to relieve back or joint pain. Into nerve areas that supply a painful area to relieve body pain. Into muscles (trigger point injections) to relieve some painful muscle conditions. A medical device placed near your spine to help block pain signals and relieve nerve pain or chronic back pain (spinal cord stimulation device). Acupuncture. Follow these instructions at home Medicines Take over-the-counter and prescription medicines only as told by your health care provider. If you are taking pain medicine, ask your health care providers about possible side effects to watch out for. Do not drive or use  heavy machinery while taking prescription opioid pain medicine. Lifestyle  Do not use drugs or alcohol to reduce pain. If you drink alcohol, limit how much you have to: 0-1 drink a day for women who are not pregnant. 0-2 drinks a day for men. Know how much alcohol is in a drink. In the [place] one drink equals one 12 oz bottle of beer (355 mL), one 5 oz glass of wine (148 mL), or one 1 oz glass of hard liquor (44 mL). Do not use any products that contain nicotine or tobacco. These products include cigarettes, chewing tobacco, and vaping devices, such as e-cigarettes. If you need help quitting, ask your health care provider. Eat a healthy diet and maintain a healthy weight. Poor diet and excess weight may make pain worse. Eat foods that are high in fiber. These include fresh fruits and vegetables, whole grains, and beans. Limit foods that are high in fat and processed sugars, such as fried and sweet foods. Exercise regularly. Exercise  lowers stress and may help relieve pain. Ask your health care provider what activities and exercises are safe for you. If your health care provider approves, join an exercise class that combines movement and stress reduction. Examples include yoga and tai chi. Get enough sleep. Lack of sleep may make pain worse. Lower stress as much as possible. Practice stress reduction techniques as told by your therapist. General instructions Work with all your pain management providers to find the treatments that work best for you. You are an important member of your pain management team. There are many things you can do to reduce pain on your own. Consider joining an online or in-person support group for people who have chronic pain. Keep all follow-up visits. This is important. Where to find more information You can find more information about managing pain without opioids from: American Academy of Pain Medicine: painmed.org Institute for Chronic Pain:  instituteforchronicpain.org American Chronic Pain Association: theacpa.org Contact a health care provider if: You have side effects from pain medicine. Your pain gets worse or does not get better with treatments or home therapy. You are struggling with anxiety or depression. Summary Many types of pain can be managed without opioids. Chronic pain may respond better to pain management without opioids. Pain is best managed when you and a team of health care providers work together. Pain management without opioids may include non-opioid medicines, medical treatments, physical therapy, mental health therapy, and lifestyle changes. Tell your health care providers if your pain gets worse or is not being managed well enough. This information is not intended to replace advice given to you by your health care provider. Make sure you discuss any questions you have with your health care provider. Document Revised: 11/08/2020 Document Reviewed: 11/08/2020 Elsevier Patient Education  2023 [employer].   Preventive Care 40-64 Years, Female Preventive care refers to lifestyle choices and visits with your health care provider that can promote health and wellness. What does preventive care include? A yearly physical exam. This is also called an annual well check. Dental exams once or twice a year. Routine eye exams. Ask your health care provider how often you should have your eyes checked. Personal lifestyle choices, including: Daily care of your teeth and gums. Regular physical activity. Eating a healthy diet. Avoiding tobacco and drug use. Limiting alcohol use. Practicing safe sex. Taking low-dose aspirin daily starting at age 57. Taking vitamin and mineral supplements as recommended by your health care provider. What happens during an annual well check? The services and screenings done by your health care provider during your annual well check will depend on your age, overall health, lifestyle  risk factors, and family history of disease. Counseling  Your health care provider may ask you questions about your: Alcohol use. Tobacco use. Drug use. Emotional well-being. Home and relationship well-being. Sexual activity. Eating habits. Work and work Astronomer. Method of birth control. Menstrual cycle. Pregnancy history. Screening  You may have the following tests or measurements: Height, weight, and BMI. Blood pressure. Lipid and cholesterol levels. These may be checked every 5 years, or more frequently if you are over 78 years old. Skin check. Lung cancer screening. You may have this screening every year starting at age 38 if you have a 30-pack-year history of smoking and currently smoke or have quit within the past 15 years. Fecal occult blood test (FOBT) of the stool. You may have this test every year starting at age 69. Flexible sigmoidoscopy or colonoscopy. You may have a  sigmoidoscopy every 5 years or a colonoscopy every 10 years starting at age 39. Hepatitis C blood test. Hepatitis B blood test. Sexually transmitted disease (STD) testing. Diabetes screening. This is done by checking your blood sugar (glucose) after you have not eaten for a while (fasting). You may have this done every 1-3 years. Mammogram. This may be done every 1-2 years. Talk to your health care provider about when you should start having regular mammograms. This may depend on whether you have a family history of breast cancer. BRCA-related cancer screening. This may be done if you have a family history of breast, ovarian, tubal, or peritoneal cancers. Pelvic exam and Pap test. This may be done every 3 years starting at age 85. Starting at age 85, this may be done every 5 years if you have a Pap test in combination with an HPV test. Bone density scan. This is done to screen for osteoporosis. You may have this scan if you are at high risk for osteoporosis. Discuss your test results, treatment options,  and if necessary, the need for more tests with your health care provider. Vaccines  Your health care provider may recommend certain vaccines, such as: Influenza vaccine. This is recommended every year. Tetanus, diphtheria, and acellular pertussis (Tdap, Td) vaccine. You may need a Td booster every 10 years. Zoster vaccine. You may need this after age 55. Pneumococcal 13-valent conjugate (PCV13) vaccine. You may need this if you have certain conditions and were not previously vaccinated. Pneumococcal polysaccharide (PPSV23) vaccine. You may need one or two doses if you smoke cigarettes or if you have certain conditions. Talk to your health care provider about which screenings and vaccines you need and how often you need them. This information is not intended to replace advice given to you by your health care provider. Make sure you discuss any questions you have with your health care provider. Document Released: 08/25/2015 Document Revised: 04/17/2016 Document Reviewed: 05/30/2015 Elsevier Interactive Patient Education  2017 [employer].    Fall Prevention in the Home Falls can cause injuries. They can happen to people of all ages. There are many things you can do to make your home safe and to help prevent falls. What can I do on the outside of my home? Regularly fix the edges of walkways and driveways and fix any cracks. Remove anything that might make you trip as you walk through a door, such as a raised step or threshold. Trim any bushes or trees on the path to your home. Use bright outdoor lighting. Clear any walking paths of anything that might make someone trip, such as rocks or tools. Regularly check to see if handrails are loose or broken. Make sure that both sides of any steps have handrails. Any raised decks and porches should have guardrails on the edges. Have any leaves, snow, or ice cleared regularly. Use sand or salt on walking paths during winter. Clean up any spills in  your garage right away. This includes oil or grease spills. What can I do in the bathroom? Use night lights. Install grab bars by the toilet and in the tub and shower. Do not use towel bars as grab bars. Use non-skid mats or decals in the tub or shower. If you need to sit down in the shower, use a plastic, non-slip stool. Keep the floor dry. Clean up any water that spills on the floor as soon as it happens. Remove soap buildup in the tub or shower regularly. Attach bath mats  securely with double-sided non-slip rug tape. Do not have throw rugs and other things on the floor that can make you trip. What can I do in the bedroom? Use night lights. Make sure that you have a light by your bed that is easy to reach. Do not use any sheets or blankets that are too big for your bed. They should not hang down onto the floor. Have a firm chair that has side arms. You can use this for support while you get dressed. Do not have throw rugs and other things on the floor that can make you trip. What can I do in the kitchen? Clean up any spills right away. Avoid walking on wet floors. Keep items that you use a lot in easy-to-reach places. If you need to reach something above you, use a strong step stool that has a grab bar. Keep electrical cords out of the way. Do not use floor polish or wax that makes floors slippery. If you must use wax, use non-skid floor wax. Do not have throw rugs and other things on the floor that can make you trip. What can I do with my stairs? Do not leave any items on the stairs. Make sure that there are handrails on both sides of the stairs and use them. Fix handrails that are broken or loose. Make sure that handrails are as long as the stairways. Check any carpeting to make sure that it is firmly attached to the stairs. Fix any carpet that is loose or worn. Avoid having throw rugs at the top or bottom of the stairs. If you do have throw rugs, attach them to the floor with carpet  tape. Make sure that you have a light switch at the top of the stairs and the bottom of the stairs. If you do not have them, ask someone to add them for you. What else can I do to help prevent falls? Wear shoes that: Do not have high heels. Have rubber bottoms. Are comfortable and fit you well. Are closed at the toe. Do not wear sandals. If you use a stepladder: Make sure that it is fully opened. Do not climb a closed stepladder. Make sure that both sides of the stepladder are locked into place. Ask someone to hold it for you, if possible. Clearly mark and make sure that you can see: Any grab bars or handrails. First and last steps. Where the edge of each step is. Use tools that help you move around (mobility aids) if they are needed. These include: Canes. Walkers. Scooters. Crutches. Turn on the lights when you go into a dark area. Replace any light bulbs as soon as they burn out. Set up your furniture so you have a clear path. Avoid moving your furniture around. If any of your floors are uneven, fix them. If there are any pets around you, be aware of where they are. Review your medicines with your doctor. Some medicines can make you feel dizzy. This can increase your chance of falling. Ask your doctor what other things that you can do to help prevent falls. This information is not intended to replace advice given to you by your health care provider. Make sure you discuss any questions you have with your health care provider. Document Released: 05/25/2009 Document Revised: 01/04/2016 Document Reviewed: 09/02/2014 Elsevier Interactive Patient Education  2017 [employer].

## 2022-12-25 NOTE — Telephone Encounter (Signed)
Patient completed AWV this morning. She is requesting a refill on her Tramadol. Pharmacy is [facility].  Thank you, Abby

## 2023-01-01 ENCOUNTER — Ambulatory Visit (INDEPENDENT_AMBULATORY_CARE_PROVIDER_SITE_OTHER): Payer: Medicare HMO | Admitting: Internal Medicine

## 2023-01-01 ENCOUNTER — Encounter: Payer: Self-pay | Admitting: Internal Medicine

## 2023-01-01 VITALS — BP 124/81 | HR 81 | Temp 98.8°F | Resp 16 | Ht 62.5 in | Wt 203.0 lb

## 2023-01-01 DIAGNOSIS — G8929 Other chronic pain: Secondary | ICD-10-CM | POA: Insufficient documentation

## 2023-01-01 DIAGNOSIS — G894 Chronic pain syndrome: Secondary | ICD-10-CM

## 2023-01-01 MED ORDER — TRAMADOL HCL 50 MG PO TABS: 50.0000 mg | ORAL_TABLET | Freq: Four times a day (QID) | ORAL | 0 refills | Status: DC | PRN

## 2023-01-01 NOTE — Progress Notes (Signed)
   HPI:[name] is a 62 y.o. female who presents with chronic pain. For the details of today's visit, please refer to the assessment and plan.  Physical Exam: Vitals:   01/01/23 0943  BP: 124/81  Pulse: 81  Resp: 16  Temp: 98.8 F (37.1 C)  TempSrc: Oral  SpO2: 96%  Weight: 203 lb (92.1 kg)  Height: 5' 2.5" (1.588 m)     Physical Exam Constitutional:      General: She is not in acute distress.    Appearance: She is obese.  Cardiovascular:     Rate and Rhythm: Normal rate and regular rhythm.     Heart sounds: No murmur heard. Pulmonary:     Effort: Pulmonary effort is normal. No respiratory distress.     Breath sounds: No wheezing.  Musculoskeletal:        General: No swelling, tenderness, deformity or signs of injury.  Skin:    Findings: No bruising or erythema.      Assessment & Plan:   [name] was seen today for generalized body aches.  Chronic pain syndrome Assessment & Plan: Patient has chronic pain for many years and previously followed with a pain management doctor.  She presents today with generalized pain in all of her joints.  This has been managed in the past with hydrocodone but this pain medication was too strong.  She was on tramadol recently prescribed for abdominal pain.  This pain medication is Keppra functional.  Briefly discussed with PCP and will refill today.  Recommend follow-up within 1 month with PCP to discuss long-term pain management plan.   Orders: -     traMADol HCl; Take 1 tablet (50 mg total) by mouth every 6 (six) hours as needed.  Dispense: 15 tablet; Refill: 0      Milus Banister, MD

## 2023-01-01 NOTE — Assessment & Plan Note (Addendum)
Patient has chronic pain for many years and previously followed with a pain management doctor.  She presents today with generalized pain in all of her joints.  This has been managed in the past with hydrocodone but this pain medication was too strong.  She was on tramadol recently prescribed for abdominal pain.  This pain medication is Keppra functional.  Briefly discussed with PCP and will refill today.  Recommend follow-up within 1 month with PCP to discuss long-term pain management plan.

## 2023-01-01 NOTE — Patient Instructions (Signed)
Thank you, Maninder Esteve for allowing [place] to provide your care today.   I will follow up with your PCP on management of your chronic pain. After we talk I will call you and discuss plan. You should suspect to hear from me in a few hours.        Thurmon Fair, M.D.

## 2023-01-02 ENCOUNTER — Ambulatory Visit: Payer: Medicare HMO | Admitting: Family Medicine

## 2023-01-27 ENCOUNTER — Other Ambulatory Visit: Payer: Self-pay | Admitting: Family Medicine

## 2023-01-27 DIAGNOSIS — J0120 Acute ethmoidal sinusitis, unspecified: Secondary | ICD-10-CM

## 2023-01-27 DIAGNOSIS — E7849 Other hyperlipidemia: Secondary | ICD-10-CM

## 2023-01-29 ENCOUNTER — Ambulatory Visit (INDEPENDENT_AMBULATORY_CARE_PROVIDER_SITE_OTHER): Payer: Medicare HMO | Admitting: Family Medicine

## 2023-01-29 ENCOUNTER — Encounter: Payer: Self-pay | Admitting: Family Medicine

## 2023-01-29 VITALS — BP 124/80 | HR 74 | Ht 62.5 in | Wt 203.1 lb

## 2023-01-29 DIAGNOSIS — G894 Chronic pain syndrome: Secondary | ICD-10-CM | POA: Diagnosis not present

## 2023-01-29 MED ORDER — TRAMADOL HCL 50 MG PO TABS: 50.0000 mg | ORAL_TABLET | Freq: Four times a day (QID) | ORAL | 0 refills | Status: DC | PRN

## 2023-01-29 NOTE — Patient Instructions (Addendum)
I appreciate the opportunity to provide care to you today!    Follow up:  02/17/2023  Please pick up your prescription at the pharmacy   Referrals today- pain clinic   Please continue to a heart-healthy diet and increase your physical activities. Try to exercise for at least five days a week.      It was a pleasure to see you and I look forward to continuing to work together on your health and well-being. Please do not hesitate to call the office if you need care or have questions about your care.   Have a wonderful day and week. With Gratitude, Gilmore Laroche MSN, FNP-BC

## 2023-01-29 NOTE — Progress Notes (Signed)
Established Patient Office Visit  Subjective:  Patient ID: [name], female    DOB: [birth year]  Age: 62 y.o. MRN: [id number]  CC:  Chief Complaint  Patient presents with   Chronic Care Management    4 week f/u for chronic pain in joints. Pt reports pain present all over in her joints pain level is a 9 out of 10.     HPI [name] is a 63 y.o. female with past medical history of chronic pain syndrome presents for f/u . For the details of today's visit, please refer to the assessment and plan.     Past Medical History:  Diagnosis Date   Anemia    Anxiety    Arthritis    back, hands , knees    Chest pain 2010   due to sarcoidosis- per pt.    Chronic pain    Depression    GERD (gastroesophageal reflux disease)    Hypercholesterolemia    Hypertension    Pneumonia    sarcoidosis- in remission    Pulmonary sarcoidosis (HCC)    Treatment with Prednisone    Past Surgical History:  Procedure Laterality Date   ABDOMINAL HYSTERECTOMY     APPENDECTOMY     CHOLECYSTECTOMY     EXCISION MASS ABDOMINAL N/A 03/20/2020   Procedure: Excision of anterior abdominal wall fat necrosis;  Surgeon: Allena Napoleon, MD;  Location: [facility];  Service: Plastics;  Laterality: N/A;   HERNIA REPAIR     INCISIONAL HERNIA REPAIR N/A 07/01/2019   Procedure: OPEN INCISIONAL HERNIA REPAIR WITH MESH;  Surgeon: Kinsinger, De Blanch, MD;  Location: [facility] OR;  Service: General;  Laterality: N/A;   INCISIONAL HERNIA REPAIR N/A 02/20/2021   Procedure: LAPAROSCOPIC RECURRENT INCISIONAL HERNIA REPAIR WITH MESH;  Surgeon: Sheliah Hatch De Blanch, MD;  Location: [facility];  Service: General;  Laterality: N/A;   INSERTION OF MESH N/A 07/01/2019   Procedure: Insertion Of Mesh;  Surgeon: Kinsinger, De Blanch, MD;  Location: [facility] OR;  Service: General;  Laterality: N/A;   LAPAROSCOPIC GASTRIC SLEEVE RESECTION N/A 02/15/2019   Procedure: LAPAROSCOPIC GASTRIC SLEEVE RESECTION, UPPER ENDO, ERAS  Pathway, Hiatal Hernia Repair;  Surgeon: Kinsinger, De Blanch, MD;  Location: [facility];  Service: General;  Laterality: N/A;   LAPAROSCOPY N/A 02/17/2019   Procedure: LAPAROSCOPY DIAGNOSTIC CONVERTED TO OPEN, EVACUATION OF HEMATOMA AND LIGATION OF VESSEL;  Surgeon: Kinsinger, De Blanch, MD;  Location: [facility];  Service: General;  Laterality: N/A;   LAPAROSCOPY N/A 07/01/2019   Procedure: Laparoscopy Diagnostic;  Surgeon: Sheliah Hatch, De Blanch, MD;  Location: [facility] OR;  Service: General;  Laterality: N/A;   SMALL INTESTINE SURGERY      Family History  Problem Relation Age of Onset   Hypertension Mother    Hyperlipidemia Mother    Lupus Father    Brain cancer Sister        2020   Heart disease Brother    Heart attack Brother    Kidney disease Maternal Grandmother    Lung cancer Neg Hx     Social History   Socioeconomic History   Marital status: Divorced    Spouse name: Not on file   Number of children: 2   Years of education: 12th   Highest education level: Some college, no degree  Occupational History   Occupation: foster parent  Tobacco Use   Smoking status: Former    Packs/day: 0.20    Years: 30.00    Additional pack years: 0.00  Total pack years: 6.00    Types: Cigarettes    Quit date: 11/2021    Years since quitting: 1.2   Smokeless tobacco: Never   Tobacco comments:    Quit smoking 04/23  Vaping Use   Vaping Use: Never used  Substance and Sexual Activity   Alcohol use: Not Currently    Comment: some   Drug use: No   Sexual activity: Yes    Birth control/protection: Surgical  Other Topics Concern   Not on file  Social History Narrative   does regular exercise. Three times a week   Social Determinants of Health   Financial Resource Strain: Low Risk  (12/25/2022)   Overall Financial Resource Strain (CARDIA)    Difficulty of Paying Living Expenses: Not hard at all  Food Insecurity: No Food Insecurity (12/25/2022)   Hunger Vital Sign    Worried About Running  Out of Food in the Last Year: Never true    Ran Out of Food in the Last Year: Never true  Transportation Needs: No Transportation Needs (12/25/2022)   PRAPARE - Administrator, Civil Service (Medical): No    Lack of Transportation (Non-Medical): No  Physical Activity: Insufficiently Active (12/25/2022)   Exercise Vital Sign    Days of Exercise per Week: 3 days    Minutes of Exercise per Session: 20 min  Stress: No Stress Concern Present (12/25/2022)   [employer] of Occupational Health - Occupational Stress Questionnaire    Feeling of Stress : Not at all  Social Connections: Socially Integrated (12/25/2022)   Social Connection and Isolation Panel [NHANES]    Frequency of Communication with Friends and Family: More than three times a week    Frequency of Social Gatherings with Friends and Family: More than three times a week    Attends Religious Services: More than 4 times per year    Active Member of [employer] or Organizations: Yes    Attends Engineer, structural: More than 4 times per year    Marital Status: Married  Catering manager Violence: Not At Risk (12/25/2022)   Humiliation, Afraid, Rape, and Kick questionnaire    Fear of Current or Ex-Partner: No    Emotionally Abused: No    Physically Abused: No    Sexually Abused: No    Outpatient Medications Prior to Visit  Medication Sig Dispense Refill   acetaminophen (TYLENOL) 325 MG tablet Take 2 tablets (650 mg total) by mouth every 6 (six) hours as needed. 36 tablet 0   amLODipine (NORVASC) 10 MG tablet TAKE ONE TABLET BY MOUTH ONCE DAILY. 90 tablet 0   atorvastatin (LIPITOR) 80 MG tablet TAKE ONE TABLET BY MOUTH ONCE DAILY. 90 tablet 0   Blood Pressure Monitoring (BLOOD PRESSURE KIT) KIT Check your blood pressure 3 x a week 1 kit 0   Cholecalciferol (VITAMIN D3) 25 MCG (1000 UT) CAPS Take 1 capsule (1,000 Units total) by mouth daily. 30 capsule 3   ezetimibe (ZETIA) 10 MG tablet Take 1 tablet (10 mg total) by  mouth daily. 90 tablet 3   fenofibrate (TRICOR) 48 MG tablet TAKE ONE TABLET BY MOUTH ONCE DAILY. 90 tablet 0   fluticasone (FLONASE) 50 MCG/ACT nasal spray PLACE 2 SPRAYS INTO BOTH NOSTRILS DAILY. 16 g 0   ibuprofen (ADVIL) 800 MG tablet Take 1 tablet (800 mg total) by mouth every 8 (eight) hours as needed. 30 tablet 0   lisinopril (ZESTRIL) 5 MG tablet Take 1 tablet (5 mg total) by  mouth daily. 90 tablet 3   Multiple Vitamins-Minerals (WOMENS 50+ MULTI VITAMIN/MIN PO) Take 1 tablet by mouth daily.     RESTASIS 0.05 % ophthalmic emulsion 1 drop 2 (two) times daily.     traMADol (ULTRAM) 50 MG tablet Take 1 tablet (50 mg total) by mouth every 6 (six) hours as needed. 15 tablet 0   sucralfate (CARAFATE) 1 g tablet Take 1 tablet (1 g total) by mouth with breakfast, with lunch, and with evening meal for 20 days. 60 tablet 0   No facility-administered medications prior to visit.    Allergies  Allergen Reactions   Aspirin Hives    ROS Review of Systems  Constitutional:  Negative for chills and fever.  Eyes:  Negative for visual disturbance.  Respiratory:  Negative for chest tightness and shortness of breath.   Musculoskeletal:  Positive for arthralgias.  Neurological:  Negative for dizziness and headaches.      Objective:    Physical Exam HENT:     Head: Normocephalic.     Mouth/Throat:     Mouth: Mucous membranes are moist.  Cardiovascular:     Rate and Rhythm: Normal rate.     Heart sounds: Normal heart sounds.  Pulmonary:     Effort: Pulmonary effort is normal.     Breath sounds: Normal breath sounds.  Neurological:     Mental Status: She is alert.     BP 124/80   Pulse 74   Ht 5' 2.5" (1.588 m)   Wt 203 lb 1.9 oz (92.1 kg)   SpO2 96%   BMI 36.56 kg/m  Wt Readings from Last 3 Encounters:  01/29/23 203 lb 1.9 oz (92.1 kg)  01/01/23 203 lb (92.1 kg)  12/25/22 209 lb (94.8 kg)    Lab Results  Component Value Date   TSH 3.740 10/11/2022   Lab Results   Component Value Date   WBC 6.0 11/14/2022   HGB 13.9 11/14/2022   HCT 41.8 11/14/2022   MCV 93 11/14/2022   PLT 331 11/14/2022   Lab Results  Component Value Date   NA 138 11/09/2022   K 3.1 (L) 11/09/2022   CO2 24 11/09/2022   GLUCOSE 133 (H) 11/09/2022   BUN 5 (L) 11/09/2022   CREATININE 0.64 11/09/2022   BILITOT 0.5 11/09/2022   ALKPHOS 77 11/09/2022   AST 57 (H) 11/09/2022   ALT 23 11/09/2022   PROT 8.0 11/09/2022   ALBUMIN 3.4 (L) 11/09/2022   CALCIUM 8.7 (L) 11/09/2022   ANIONGAP 11 11/09/2022   EGFR 95 10/11/2022   Lab Results  Component Value Date   CHOL 201 (H) 10/11/2022   Lab Results  Component Value Date   HDL 40 10/11/2022   Lab Results  Component Value Date   LDLCALC 116 (H) 10/11/2022   Lab Results  Component Value Date   TRIG 258 (H) 10/11/2022   Lab Results  Component Value Date   CHOLHDL 5.0 (H) 10/11/2022   Lab Results  Component Value Date   HGBA1C 5.6 10/11/2022      Assessment & Plan:  Chronic pain syndrome Assessment & Plan: History of chronic joint pain for over 10 years Pain is constant and sometimes affects her ADLs Pain is rated today 8 out of 10 She reports exercising exacerbate her pain however she does feel better when she exercise She has been on disability for sarcoidosis since 2004 She reports following up pain management in the past and was provided hydrocodone 10 mg, which  she reported made her feel drowsy She reports better tolerance with tramadol Refill sent to the pharmacy We will place a referral in today to pain management for long-term management of her pain  Orders: -     Ambulatory referral to Pain Clinic -     traMADol HCl; Take 1 tablet (50 mg total) by mouth every 6 (six) hours as needed.  Dispense: 20 tablet; Refill: 0    Follow-up: Return in about 19 days (around 02/17/2023).   Gilmore Laroche, FNP

## 2023-01-29 NOTE — Assessment & Plan Note (Signed)
History of chronic joint pain for over 10 years Pain is constant and sometimes affects her ADLs Pain is rated today 8 out of 10 She reports exercising exacerbate her pain however she does feel better when she exercise She has been on disability for sarcoidosis since 2004 She reports following up pain management in the past and was provided hydrocodone 10 mg, which she reported made her feel drowsy She reports better tolerance with tramadol Refill sent to the pharmacy We will place a referral in today to pain management for long-term management of her pain

## 2023-02-17 ENCOUNTER — Ambulatory Visit: Payer: Medicare HMO | Admitting: Family Medicine

## 2023-02-28 ENCOUNTER — Encounter (HOSPITAL_COMMUNITY): Payer: Self-pay | Admitting: Emergency Medicine

## 2023-02-28 ENCOUNTER — Emergency Department (HOSPITAL_COMMUNITY): Payer: Medicare HMO

## 2023-02-28 ENCOUNTER — Other Ambulatory Visit: Payer: Self-pay

## 2023-02-28 ENCOUNTER — Emergency Department (HOSPITAL_COMMUNITY)
Admission: EM | Admit: 2023-02-28 | Discharge: 2023-03-01 | Disposition: A | Discharge status: Home / Self Care | Payer: Medicare HMO | Attending: Emergency Medicine | Admitting: Emergency Medicine

## 2023-02-28 DIAGNOSIS — R1013 Epigastric pain: Secondary | ICD-10-CM | POA: Diagnosis not present

## 2023-02-28 DIAGNOSIS — R112 Nausea with vomiting, unspecified: Secondary | ICD-10-CM | POA: Insufficient documentation

## 2023-02-28 DIAGNOSIS — R1084 Generalized abdominal pain: Secondary | ICD-10-CM | POA: Insufficient documentation

## 2023-02-28 DIAGNOSIS — Z87891 Personal history of nicotine dependence: Secondary | ICD-10-CM | POA: Insufficient documentation

## 2023-02-28 DIAGNOSIS — I10 Essential (primary) hypertension: Secondary | ICD-10-CM | POA: Diagnosis not present

## 2023-02-28 DIAGNOSIS — R079 Chest pain, unspecified: Secondary | ICD-10-CM | POA: Diagnosis not present

## 2023-02-28 DIAGNOSIS — M542 Cervicalgia: Secondary | ICD-10-CM | POA: Diagnosis not present

## 2023-02-28 DIAGNOSIS — R109 Unspecified abdominal pain: Secondary | ICD-10-CM | POA: Diagnosis not present

## 2023-02-28 DIAGNOSIS — N200 Calculus of kidney: Secondary | ICD-10-CM | POA: Diagnosis not present

## 2023-02-28 DIAGNOSIS — I517 Cardiomegaly: Secondary | ICD-10-CM | POA: Diagnosis not present

## 2023-02-28 LAB — COMPREHENSIVE METABOLIC PANEL
ALT: 24 U/L (ref 0–44)
AST: 47 U/L — ABNORMAL HIGH (ref 15–41)
Albumin: 3.7 g/dL (ref 3.5–5.0)
Alkaline Phosphatase: 65 U/L (ref 38–126)
Anion gap: 10 (ref 5–15)
BUN: 9 mg/dL (ref 8–23)
CO2: 22 mmol/L (ref 22–32)
Calcium: 8.9 mg/dL (ref 8.9–10.3)
Chloride: 99 mmol/L (ref 98–111)
Creatinine, Ser: 1.16 mg/dL — ABNORMAL HIGH (ref 0.44–1.00)
GFR, Estimated: 54 mL/min — ABNORMAL LOW (ref >60)
Glucose, Bld: 92 mg/dL (ref 70–99)
Potassium: 3.6 mmol/L (ref 3.5–5.1)
Sodium: 131 mmol/L — ABNORMAL LOW (ref 135–145)
Total Bilirubin: 0.7 mg/dL (ref 0.3–1.2)
Total Protein: 7.9 g/dL (ref 6.5–8.1)

## 2023-02-28 LAB — CBC
HCT: 36.1 % (ref 36.0–46.0)
Hemoglobin: 12.7 g/dL (ref 12.0–15.0)
MCH: 31.5 pg (ref 26.0–34.0)
MCHC: 35.2 g/dL (ref 30.0–36.0)
MCV: 89.6 fL (ref 80.0–100.0)
Platelets: 290 10*3/uL (ref 150–400)
RBC: 4.03 MIL/uL (ref 3.87–5.11)
RDW: 13.9 % (ref 11.5–15.5)
WBC: 9 10*3/uL (ref 4.0–10.5)
nRBC: 0 % (ref 0.0–0.2)

## 2023-02-28 LAB — TROPONIN I (HIGH SENSITIVITY): Troponin I (High Sensitivity): <2 ng/L (ref <18)

## 2023-02-28 LAB — LIPASE, BLOOD: Lipase: 72 U/L — ABNORMAL HIGH (ref 11–51)

## 2023-02-28 MED ORDER — SODIUM CHLORIDE 0.9 % IV BOLUS
1000.0000 mL | Freq: Once | INTRAVENOUS | Status: AC
  Administered 2023-03-01: 1000 mL via INTRAVENOUS

## 2023-02-28 MED ORDER — FAMOTIDINE IN NACL 20-0.9 MG/50ML-% IV SOLN
20.0000 mg | Freq: Once | INTRAVENOUS | Status: AC
  Administered 2023-03-01: 20 mg via INTRAVENOUS
  Filled 2023-02-28: qty 50

## 2023-02-28 MED ORDER — KETOROLAC TROMETHAMINE 15 MG/ML IJ SOLN
15.0000 mg | Freq: Once | INTRAMUSCULAR | Status: AC
  Administered 2023-03-01: 15 mg via INTRAVENOUS
  Filled 2023-02-28: qty 1

## 2023-02-28 MED ORDER — FENTANYL CITRATE PF 50 MCG/ML IJ SOSY
50.0000 ug | PREFILLED_SYRINGE | Freq: Once | INTRAMUSCULAR | Status: AC
  Administered 2023-03-01: 50 ug via INTRAVENOUS
  Filled 2023-02-28: qty 1

## 2023-02-28 NOTE — ED Provider Notes (Signed)
[facility]-EMERGENCY DEPT [facility] Emergency Department Provider Note MRN:  [id number]  Arrival date & time: 03/01/23     Chief Complaint   Abdominal Pain   History of Present Illness   [name] is a 62 y.o. year-old female with a history of sarcoidosis presenting to the ED with chief complaint of abdominal pain.  2 weeks of epigastric, left upper quadrant abdominal pain, getting worse and worse.  Now having some anterior throat discomfort, total body aches and pains.  Denies fever.  Nausea vomiting but no diarrhea.  Review of Systems  A thorough review of systems was obtained and all systems are negative except as noted in the HPI and PMH.   Patient's Health History    Past Medical History:  Diagnosis Date   Anemia    Anxiety    Arthritis    back, hands , knees    Chest pain 2010   due to sarcoidosis- per pt.    Chronic pain    Depression    GERD (gastroesophageal reflux disease)    Hypercholesterolemia    Hypertension    Pneumonia    sarcoidosis- in remission    Pulmonary sarcoidosis (HCC)    Treatment with Prednisone    Past Surgical History:  Procedure Laterality Date   ABDOMINAL HYSTERECTOMY     APPENDECTOMY     CHOLECYSTECTOMY     EXCISION MASS ABDOMINAL N/A 03/20/2020   Procedure: Excision of anterior abdominal wall fat necrosis;  Surgeon: Allena Napoleon, MD;  Location: [facility];  Service: Plastics;  Laterality: N/A;   HERNIA REPAIR     INCISIONAL HERNIA REPAIR N/A 07/01/2019   Procedure: OPEN INCISIONAL HERNIA REPAIR WITH MESH;  Surgeon: Kinsinger, De Blanch, MD;  Location: [facility] OR;  Service: General;  Laterality: N/A;   INCISIONAL HERNIA REPAIR N/A 02/20/2021   Procedure: LAPAROSCOPIC RECURRENT INCISIONAL HERNIA REPAIR WITH MESH;  Surgeon: Sheliah Hatch De Blanch, MD;  Location: [facility];  Service: General;  Laterality: N/A;   INSERTION OF MESH N/A 07/01/2019   Procedure: Insertion Of Mesh;  Surgeon: Kinsinger, De Blanch, MD;  Location:  [facility] OR;  Service: General;  Laterality: N/A;   LAPAROSCOPIC GASTRIC SLEEVE RESECTION N/A 02/15/2019   Procedure: LAPAROSCOPIC GASTRIC SLEEVE RESECTION, UPPER ENDO, ERAS Pathway, Hiatal Hernia Repair;  Surgeon: Kinsinger, De Blanch, MD;  Location: [facility];  Service: General;  Laterality: N/A;   LAPAROSCOPY N/A 02/17/2019   Procedure: LAPAROSCOPY DIAGNOSTIC CONVERTED TO OPEN, EVACUATION OF HEMATOMA AND LIGATION OF VESSEL;  Surgeon: Kinsinger, De Blanch, MD;  Location: [facility];  Service: General;  Laterality: N/A;   LAPAROSCOPY N/A 07/01/2019   Procedure: Laparoscopy Diagnostic;  Surgeon: Sheliah Hatch, De Blanch, MD;  Location: [facility] OR;  Service: General;  Laterality: N/A;   SMALL INTESTINE SURGERY      Family History  Problem Relation Age of Onset   Hypertension Mother    Hyperlipidemia Mother    Lupus Father    Brain cancer Sister        2020   Heart disease Brother    Heart attack Brother    Kidney disease Maternal Grandmother    Lung cancer Neg Hx     Social History   Socioeconomic History   Marital status: Divorced    Spouse name: Not on file   Number of children: 2   Years of education: 12th   Highest education level: Some college, no degree  Occupational History   Occupation: foster parent  Tobacco Use   Smoking  status: Former    Current packs/day: 0.00    Average packs/day: 0.2 packs/day for 30.0 years (6.0 ttl pk-yrs)    Types: Cigarettes    Start date: 11/1991    Quit date: 11/2021    Years since quitting: 1.3   Smokeless tobacco: Never   Tobacco comments:    Quit smoking 04/23  Vaping Use   Vaping status: Never Used  Substance and Sexual Activity   Alcohol use: Not Currently    Comment: some   Drug use: No   Sexual activity: Yes    Birth control/protection: Surgical  Other Topics Concern   Not on file  Social History Narrative   does regular exercise. Three times a week   Social Determinants of Health   Financial Resource Strain: Low Risk  (12/25/2022)    Overall Financial Resource Strain (CARDIA)    Difficulty of Paying Living Expenses: Not hard at all  Food Insecurity: No Food Insecurity (12/25/2022)   Hunger Vital Sign    Worried About Running Out of Food in the Last Year: Never true    Ran Out of Food in the Last Year: Never true  Transportation Needs: No Transportation Needs (12/25/2022)   PRAPARE - Administrator, Civil Service (Medical): No    Lack of Transportation (Non-Medical): No  Physical Activity: Insufficiently Active (12/25/2022)   Exercise Vital Sign    Days of Exercise per Week: 3 days    Minutes of Exercise per Session: 20 min  Stress: No Stress Concern Present (12/25/2022)   [employer] of Occupational Health - Occupational Stress Questionnaire    Feeling of Stress : Not at all  Social Connections: Socially Integrated (12/25/2022)   Social Connection and Isolation Panel [NHANES]    Frequency of Communication with Friends and Family: More than three times a week    Frequency of Social Gatherings with Friends and Family: More than three times a week    Attends Religious Services: More than 4 times per year    Active Member of [employer] or Organizations: Yes    Attends Engineer, structural: More than 4 times per year    Marital Status: Married  Catering manager Violence: Not At Risk (12/25/2022)   Humiliation, Afraid, Rape, and Kick questionnaire    Fear of Current or Ex-Partner: No    Emotionally Abused: No    Physically Abused: No    Sexually Abused: No     Physical Exam   Vitals:   03/01/23 0130 03/01/23 0200  BP: 135/71 122/73  Pulse: 73 73  Resp: 15 (!) 21  Temp:  98.6 F (37 C)  SpO2: 96% 95%    CONSTITUTIONAL: Well-appearing, NAD NEURO/PSYCH:  Alert and oriented x 3, no focal deficits EYES:  eyes equal and reactive ENT/NECK:  no LAD, no JVD CARDIO: Regular rate, well-perfused, normal S1 and S2 PULM:  CTAB no wheezing or rhonchi GI/GU:  non-distended, non-tender MSK/SPINE:  No  gross deformities, no edema SKIN:  no rash, atraumatic   *Additional and/or pertinent findings included in MDM below  Diagnostic and Interventional Summary    EKG Interpretation Date/Time:  Saturday March 01 2023 01:22:51 EDT Ventricular Rate:  71 PR Interval:  193 QRS Duration:  101 QT Interval:  430 QTC Calculation: 468 R Axis:   66  Text Interpretation: Sinus rhythm Nonspecific T abnrm, anterolateral leads Confirmed by Kennis Carina [phone]) on 03/01/2023 1:35:04 AM       Labs Reviewed  LIPASE, BLOOD - Abnormal;  Notable for the following components:      Result Value   Lipase 72 (*)    All other components within normal limits  COMPREHENSIVE METABOLIC PANEL - Abnormal; Notable for the following components:   Sodium 131 (*)    Creatinine, Ser 1.16 (*)    AST 47 (*)    GFR, Estimated 54 (*)    All other components within normal limits  CBC  CK  URINALYSIS, ROUTINE W REFLEX MICROSCOPIC  TROPONIN I (HIGH SENSITIVITY)    CT ABDOMEN PELVIS W CONTRAST  Final Result    DG Chest Port 1 View  Final Result      Medications  sodium chloride 0.9 % bolus 1,000 mL (0 mLs Intravenous Stopped 03/01/23 0219)  famotidine (PEPCID) IVPB 20 mg premix (0 mg Intravenous Stopped 03/01/23 0102)  ketorolac (TORADOL) 15 MG/ML injection 15 mg (15 mg Intravenous Given 03/01/23 0006)  fentaNYL (SUBLIMAZE) injection 50 mcg (50 mcg Intravenous Given 03/01/23 0006)  iohexol (OMNIPAQUE) 300 MG/ML solution 100 mL (100 mLs Intravenous Contrast Given 03/01/23 0045)  morphine (PF) 4 MG/ML injection 4 mg (4 mg Intravenous Given 03/01/23 0155)     Procedures  /  Critical Care Procedures  ED Course and Medical Decision Making  Initial Impression and Ddx Differential diagnosis includes GERD, pancreatitis, biliary colic, cholecystitis, perforated viscus, gastric outlet obstruction  Past medical/surgical history that increases complexity of ED encounter: Sarcoidosis  Interpretation of Diagnostics I  personally reviewed the EKG and my interpretation is as follows: Sinus rhythm, no significant change from prior  Labs overall reassuring with no significant blood count or electrolyte disturbance.  Patient Reassessment and Ultimate Disposition/Management     CT abdomen is unremarkable, patient feeling better, no emergent process, appropriate for discharge.  Patient management required discussion with the following services or consulting groups:  None  Complexity of Problems Addressed Acute illness or injury that poses threat of life of bodily function  Additional Data Reviewed and Analyzed Further history obtained from: Further history from spouse/family member  Additional Factors Impacting ED Encounter Risk Prescriptions and Use of parenteral controlled substances  Elmer Sow. Pilar Plate, MD [facility] Emergency Medicine [facility] [email or web address]  Final Clinical Impressions(s) / ED Diagnoses     ICD-10-CM   1. Generalized abdominal pain  R10.84       ED Discharge Orders          Ordered    dicyclomine (BENTYL) 20 MG tablet  2 times daily        03/01/23 0230    ondansetron (ZOFRAN-ODT) 4 MG disintegrating tablet  Every 8 hours PRN        03/01/23 0230    omeprazole (PRILOSEC) 20 MG capsule  Daily        03/01/23 0230             Discharge Instructions Discussed with and Provided to Patient:     Discharge Instructions      You were evaluated in the Emergency Department and after careful evaluation, we did not find any emergent condition requiring admission or further testing in the hospital.  Your exam/testing today is overall reassuring.  Recommend follow-up with your primary care doctor to discuss her symptoms.  Use the omeprazole daily to prevent acid related pain.  Can use the Bentyl as needed for crampy abdominal pain.  Use the Zofran as needed for nausea.  Please return to the Emergency Department if you experience any worsening of your  condition.  Thank you for allowing [place] to be a part of your care.       Sabas Sous, MD 03/01/23 [phone]

## 2023-02-28 NOTE — ED Triage Notes (Signed)
Pt with c/o abdominal pain x 2 weeks and emesis x 1 week. Pt also c/o cramping "all over body".

## 2023-03-01 ENCOUNTER — Emergency Department (HOSPITAL_COMMUNITY): Payer: Medicare HMO

## 2023-03-01 DIAGNOSIS — R1084 Generalized abdominal pain: Secondary | ICD-10-CM | POA: Diagnosis not present

## 2023-03-01 DIAGNOSIS — N200 Calculus of kidney: Secondary | ICD-10-CM | POA: Diagnosis not present

## 2023-03-01 DIAGNOSIS — R109 Unspecified abdominal pain: Secondary | ICD-10-CM | POA: Diagnosis not present

## 2023-03-01 LAB — CK: Total CK: 82 U/L (ref 38–234)

## 2023-03-01 MED ORDER — DICYCLOMINE HCL 20 MG PO TABS: 20.0000 mg | ORAL_TABLET | Freq: Two times a day (BID) | ORAL | 0 refills | Status: DC

## 2023-03-01 MED ORDER — HYDROMORPHONE HCL 1 MG/ML IJ SOLN: 2.0000 mg | Freq: Once | INTRAMUSCULAR | Status: DC

## 2023-03-01 MED ORDER — IOHEXOL 300 MG/ML  SOLN
100.0000 mL | Freq: Once | INTRAMUSCULAR | Status: AC | PRN
  Administered 2023-03-01: 100 mL via INTRAVENOUS

## 2023-03-01 MED ORDER — MORPHINE SULFATE (PF) 4 MG/ML IV SOLN
4.0000 mg | Freq: Once | INTRAVENOUS | Status: AC
  Administered 2023-03-01: 4 mg via INTRAVENOUS
  Filled 2023-03-01: qty 1

## 2023-03-01 MED ORDER — OMEPRAZOLE 20 MG PO CPDR: 20.0000 mg | DELAYED_RELEASE_CAPSULE | Freq: Every day | ORAL | 1 refills | Status: DC

## 2023-03-01 MED ORDER — ONDANSETRON 4 MG PO TBDP: 4.0000 mg | ORAL_TABLET | Freq: Three times a day (TID) | ORAL | 0 refills | Status: DC | PRN

## 2023-03-01 NOTE — Discharge Instructions (Addendum)
You were evaluated in the Emergency Department and after careful evaluation, we did not find any emergent condition requiring admission or further testing in the hospital.  Your exam/testing today is overall reassuring.  Recommend follow-up with your primary care doctor to discuss her symptoms.  Use the omeprazole daily to prevent acid related pain.  Can use the Bentyl as needed for crampy abdominal pain.  Use the Zofran as needed for nausea.  Please return to the Emergency Department if you experience any worsening of your condition.   Thank you for allowing [place] to be a part of your care.

## 2023-03-06 ENCOUNTER — Telehealth: Payer: Self-pay

## 2023-03-06 NOTE — Telephone Encounter (Signed)
Transition Care Management Unsuccessful Follow-up Telephone Call  Date of discharge and from where:  [facility]  Attempts:  1st Attempt  Reason for unsuccessful TCM follow-up call:  No answer/busy   Lenard Forth [facility] Guide, [facility] [phone] [address] [place], [place], [place] [address] Phone: [phone] Email: [email or web address]

## 2023-03-07 ENCOUNTER — Telehealth: Payer: Self-pay

## 2023-03-07 NOTE — Telephone Encounter (Signed)
Transition Care Management Follow-up Telephone Call Date of discharge and from where: [name] 7/20 How have you been since you were released from the hospital? Doing good  Any questions or concerns? No  Items Reviewed: Did the pt receive and understand the discharge instructions provided? Yes  Medications obtained and verified? Yes  Other? No  Any new allergies since your discharge? No  Dietary orders reviewed? No Do you have support at home? Yes   Follow up appointments reviewed:  PCP Hospital f/u appt confirmed? Yes  Scheduled to see PCP on 7/30 @ . Specialist Hospital f/u appt confirmed? No  Scheduled to see  on  @ . Are transportation arrangements needed? No If their condition worsens, is the pt aware to call PCP or go to the Emergency Dept.? Yes Was the patient provided with contact information for the PCP's office or ED? Yes Was to pt encouraged to call back with questions or concerns? Yes

## 2023-03-17 DIAGNOSIS — Z79899 Other long term (current) drug therapy: Secondary | ICD-10-CM | POA: Diagnosis not present

## 2023-03-17 DIAGNOSIS — M129 Arthropathy, unspecified: Secondary | ICD-10-CM | POA: Diagnosis not present

## 2023-03-17 DIAGNOSIS — G894 Chronic pain syndrome: Secondary | ICD-10-CM | POA: Diagnosis not present

## 2023-03-20 ENCOUNTER — Other Ambulatory Visit: Payer: Self-pay | Admitting: Family Medicine

## 2023-03-20 DIAGNOSIS — E7849 Other hyperlipidemia: Secondary | ICD-10-CM

## 2023-03-25 DIAGNOSIS — M5136 Other intervertebral disc degeneration, lumbar region: Secondary | ICD-10-CM | POA: Diagnosis not present

## 2023-03-27 DIAGNOSIS — Z880 Allergy status to penicillin: Secondary | ICD-10-CM | POA: Diagnosis not present

## 2023-03-27 DIAGNOSIS — Z8249 Family history of ischemic heart disease and other diseases of the circulatory system: Secondary | ICD-10-CM | POA: Diagnosis not present

## 2023-03-27 DIAGNOSIS — M545 Low back pain, unspecified: Secondary | ICD-10-CM | POA: Diagnosis not present

## 2023-03-27 DIAGNOSIS — Z87892 Personal history of anaphylaxis: Secondary | ICD-10-CM | POA: Diagnosis not present

## 2023-03-27 DIAGNOSIS — E785 Hyperlipidemia, unspecified: Secondary | ICD-10-CM | POA: Diagnosis not present

## 2023-03-27 DIAGNOSIS — I10 Essential (primary) hypertension: Secondary | ICD-10-CM | POA: Diagnosis not present

## 2023-03-27 DIAGNOSIS — F17210 Nicotine dependence, cigarettes, uncomplicated: Secondary | ICD-10-CM | POA: Diagnosis not present

## 2023-03-27 DIAGNOSIS — D860 Sarcoidosis of lung: Secondary | ICD-10-CM | POA: Diagnosis not present

## 2023-03-27 DIAGNOSIS — I700 Atherosclerosis of aorta: Secondary | ICD-10-CM | POA: Diagnosis not present

## 2023-03-27 DIAGNOSIS — K219 Gastro-esophageal reflux disease without esophagitis: Secondary | ICD-10-CM | POA: Diagnosis not present

## 2023-03-27 DIAGNOSIS — M1990 Unspecified osteoarthritis, unspecified site: Secondary | ICD-10-CM | POA: Diagnosis not present

## 2023-04-01 ENCOUNTER — Other Ambulatory Visit: Payer: Self-pay | Admitting: Family Medicine

## 2023-04-01 DIAGNOSIS — M5136 Other intervertebral disc degeneration, lumbar region: Secondary | ICD-10-CM | POA: Diagnosis not present

## 2023-04-10 ENCOUNTER — Encounter (HOSPITAL_COMMUNITY): Payer: Self-pay

## 2023-04-10 ENCOUNTER — Ambulatory Visit (HOSPITAL_COMMUNITY)
Admission: RE | Admit: 2023-04-10 | Discharge: 2023-04-10 | Disposition: A | Discharge status: Home / Self Care | Payer: Medicare HMO | Source: Ambulatory Visit | Attending: Family Medicine | Admitting: Family Medicine

## 2023-04-10 DIAGNOSIS — Z1231 Encounter for screening mammogram for malignant neoplasm of breast: Secondary | ICD-10-CM | POA: Insufficient documentation

## 2023-04-22 ENCOUNTER — Ambulatory Visit: Payer: Medicare HMO | Admitting: Internal Medicine

## 2023-04-23 ENCOUNTER — Encounter: Payer: Self-pay | Admitting: Family Medicine

## 2023-05-08 ENCOUNTER — Other Ambulatory Visit: Payer: Self-pay | Admitting: Family Medicine

## 2023-05-08 DIAGNOSIS — E7849 Other hyperlipidemia: Secondary | ICD-10-CM

## 2023-05-09 DIAGNOSIS — Z79899 Other long term (current) drug therapy: Secondary | ICD-10-CM | POA: Diagnosis not present

## 2023-05-09 DIAGNOSIS — M25562 Pain in left knee: Secondary | ICD-10-CM | POA: Diagnosis not present

## 2023-05-09 DIAGNOSIS — M545 Low back pain, unspecified: Secondary | ICD-10-CM | POA: Diagnosis not present

## 2023-05-09 DIAGNOSIS — M25561 Pain in right knee: Secondary | ICD-10-CM | POA: Diagnosis not present

## 2023-05-09 DIAGNOSIS — Z6836 Body mass index (BMI) 36.0-36.9, adult: Secondary | ICD-10-CM | POA: Diagnosis not present

## 2023-05-09 DIAGNOSIS — G8929 Other chronic pain: Secondary | ICD-10-CM | POA: Diagnosis not present

## 2023-05-16 ENCOUNTER — Other Ambulatory Visit: Payer: Self-pay | Admitting: Family Medicine

## 2023-05-16 DIAGNOSIS — Z1212 Encounter for screening for malignant neoplasm of rectum: Secondary | ICD-10-CM

## 2023-05-16 DIAGNOSIS — Z1211 Encounter for screening for malignant neoplasm of colon: Secondary | ICD-10-CM

## 2023-05-26 ENCOUNTER — Encounter: Payer: Self-pay | Admitting: Family Medicine

## 2023-05-26 ENCOUNTER — Ambulatory Visit (INDEPENDENT_AMBULATORY_CARE_PROVIDER_SITE_OTHER): Payer: Medicare HMO | Admitting: Family Medicine

## 2023-05-26 VITALS — BP 117/76 | HR 73 | Ht 62.5 in | Wt 204.0 lb

## 2023-05-26 DIAGNOSIS — K219 Gastro-esophageal reflux disease without esophagitis: Secondary | ICD-10-CM

## 2023-05-26 DIAGNOSIS — I10 Essential (primary) hypertension: Secondary | ICD-10-CM

## 2023-05-26 DIAGNOSIS — E559 Vitamin D deficiency, unspecified: Secondary | ICD-10-CM | POA: Diagnosis not present

## 2023-05-26 DIAGNOSIS — R7301 Impaired fasting glucose: Secondary | ICD-10-CM

## 2023-05-26 DIAGNOSIS — E7849 Other hyperlipidemia: Secondary | ICD-10-CM

## 2023-05-26 DIAGNOSIS — R42 Dizziness and giddiness: Secondary | ICD-10-CM | POA: Diagnosis not present

## 2023-05-26 NOTE — Assessment & Plan Note (Signed)
Encouraged to continue taking atorvastatin 80 mg daily, ezetimibe 10 mg daily, fenofibrate 48 mg daily, and omega-3 fatty acid 2 g twice daily Encouraged decreasing her intake of greasy, fatty, starchy foods with increase physical activity Lab Results  Component Value Date   CHOL 201 (H) 10/11/2022   HDL 40 10/11/2022   LDLCALC 116 (H) 10/11/2022   TRIG 258 (H) 10/11/2022   CHOLHDL 5.0 (H) 10/11/2022

## 2023-05-26 NOTE — Assessment & Plan Note (Signed)
Encouraged to continue Protonix 40 mg daily GERD diet encouraged

## 2023-05-26 NOTE — Assessment & Plan Note (Signed)
Encouraged to increase her fluid intake at least 64 ounces daily, change positions slowly and maintain a heart healthy diet. Pending CBC,TSH and BMP

## 2023-05-26 NOTE — Progress Notes (Addendum)
Established Patient Office Visit  Subjective:  Patient ID: [name], female    DOB: 11-02-1960  Age: 62 y.o. MRN: [id number]  CC:  Chief Complaint  Patient presents with   Dizziness    Pt reports dizziness/lightheaded ongoing for 1 month.     HPI [name] is a 62 y.o. female with past medical history of hypertension, hyperlipidemia, and GERD presents for f/u of  chronic medical conditions.  Hypertension: The patient is currently taking amlodipine 10 mg daily and lisinopril 5 mg daily. She reports treatment compliance and is asymptomatic during the clinic visit.  Hyperlipidemia: She takes atorvastatin 80 mg daily, ezetimibe 10 mg daily, and fenofibrate 48 mg daily. She reports treatment compliance and denies experiencing any muscle aches or pain.  GERD: The patient is compliant with omeprazole 20 mg daily.  Dizziness: For the past month, the patient has been experiencing dizziness with positional changes. She denies experiencing a spinning sensation, tinnitus, ear aches, pain, fever, chills, headaches, nasal congestion, or changes in bowel or bladder function. No recent symptoms of upper respiratory infection have been reported. She admits to not drinking at least 64 ounces of water daily and has not noted any falls or syncope sensations.   Past Medical History:  Diagnosis Date   Anemia    Anxiety    Arthritis    back, hands , knees    Chest pain 2010   due to sarcoidosis- per pt.    Chronic pain    Depression    GERD (gastroesophageal reflux disease)    Hypercholesterolemia    Hypertension    Pneumonia    sarcoidosis- in remission    Pulmonary sarcoidosis (HCC)    Treatment with Prednisone    Past Surgical History:  Procedure Laterality Date   ABDOMINAL HYSTERECTOMY     APPENDECTOMY     CHOLECYSTECTOMY     EXCISION MASS ABDOMINAL N/A 03/20/2020   Procedure: Excision of anterior abdominal wall fat necrosis;  Surgeon: Allena Napoleon, MD;  Location: [facility];  Service: Plastics;  Laterality: N/A;   HERNIA REPAIR     INCISIONAL HERNIA REPAIR N/A 07/01/2019   Procedure: OPEN INCISIONAL HERNIA REPAIR WITH MESH;  Surgeon: Kinsinger, De Blanch, MD;  Location: [facility] OR;  Service: General;  Laterality: N/A;   INCISIONAL HERNIA REPAIR N/A 02/20/2021   Procedure: LAPAROSCOPIC RECURRENT INCISIONAL HERNIA REPAIR WITH MESH;  Surgeon: Sheliah Hatch De Blanch, MD;  Location: [facility];  Service: General;  Laterality: N/A;   INSERTION OF MESH N/A 07/01/2019   Procedure: Insertion Of Mesh;  Surgeon: Kinsinger, De Blanch, MD;  Location: [facility] OR;  Service: General;  Laterality: N/A;   LAPAROSCOPIC GASTRIC SLEEVE RESECTION N/A 02/15/2019   Procedure: LAPAROSCOPIC GASTRIC SLEEVE RESECTION, UPPER ENDO, ERAS Pathway, Hiatal Hernia Repair;  Surgeon: Kinsinger, De Blanch, MD;  Location: [facility];  Service: General;  Laterality: N/A;   LAPAROSCOPY N/A 02/17/2019   Procedure: LAPAROSCOPY DIAGNOSTIC CONVERTED TO OPEN, EVACUATION OF HEMATOMA AND LIGATION OF VESSEL;  Surgeon: Kinsinger, De Blanch, MD;  Location: [facility];  Service: General;  Laterality: N/A;   LAPAROSCOPY N/A 07/01/2019   Procedure: Laparoscopy Diagnostic;  Surgeon: Sheliah Hatch De Blanch, MD;  Location: [facility] OR;  Service: General;  Laterality: N/A;   SMALL INTESTINE SURGERY      Family History  Problem Relation Age of Onset   Hypertension Mother    Hyperlipidemia Mother    Lupus Father    Brain cancer Sister  2020   Heart disease Brother    Heart attack Brother    Kidney disease Maternal Grandmother    Lung cancer Neg Hx     Social History   Socioeconomic History   Marital status: Divorced    Spouse name: Not on file   Number of children: 2   Years of education: 12th   Highest education level: Some college, no degree  Occupational History   Occupation: foster parent  Tobacco Use   Smoking status: Former    Current packs/day: 0.00    Average packs/day: 0.2 packs/day for 30.0 years  (6.0 ttl pk-yrs)    Types: Cigarettes    Start date: 11/1991    Quit date: 11/2021    Years since quitting: 1.5   Smokeless tobacco: Never   Tobacco comments:    Quit smoking 04/23  Vaping Use   Vaping status: Never Used  Substance and Sexual Activity   Alcohol use: Not Currently    Comment: some   Drug use: No   Sexual activity: Yes    Birth control/protection: Surgical  Other Topics Concern   Not on file  Social History Narrative   does regular exercise. Three times a week   Social Determinants of Health   Financial Resource Strain: Low Risk  (12/25/2022)   Overall Financial Resource Strain (CARDIA)    Difficulty of Paying Living Expenses: Not hard at all  Food Insecurity: No Food Insecurity (12/25/2022)   Hunger Vital Sign    Worried About Running Out of Food in the Last Year: Never true    Ran Out of Food in the Last Year: Never true  Transportation Needs: No Transportation Needs (12/25/2022)   PRAPARE - Administrator, Civil Service (Medical): No    Lack of Transportation (Non-Medical): No  Physical Activity: Insufficiently Active (12/25/2022)   Exercise Vital Sign    Days of Exercise per Week: 3 days    Minutes of Exercise per Session: 20 min  Stress: No Stress Concern Present (12/25/2022)   [employer] of Occupational Health - Occupational Stress Questionnaire    Feeling of Stress : Not at all  Social Connections: Socially Integrated (12/25/2022)   Social Connection and Isolation Panel [NHANES]    Frequency of Communication with Friends and Family: More than three times a week    Frequency of Social Gatherings with Friends and Family: More than three times a week    Attends Religious Services: More than 4 times per year    Active Member of [employer] or Organizations: Yes    Attends Engineer, structural: More than 4 times per year    Marital Status: Married  Catering manager Violence: Not At Risk (12/25/2022)   Humiliation, Afraid, Rape, and Kick  questionnaire    Fear of Current or Ex-Partner: No    Emotionally Abused: No    Physically Abused: No    Sexually Abused: No    Outpatient Medications Prior to Visit  Medication Sig Dispense Refill   acetaminophen (TYLENOL) 325 MG tablet Take 2 tablets (650 mg total) by mouth every 6 (six) hours as needed. 36 tablet 0   amLODipine (NORVASC) 10 MG tablet TAKE ONE TABLET BY MOUTH ONCE DAILY. 90 tablet 0   atorvastatin (LIPITOR) 80 MG tablet TAKE ONE TABLET BY MOUTH ONCE DAILY. 90 tablet 0   Blood Pressure Monitoring (BLOOD PRESSURE KIT) KIT Check your blood pressure 3 x a week 1 kit 0   Cholecalciferol (VITAMIN D3) 25 MCG (  1000 UT) CAPS Take 1 capsule (1,000 Units total) by mouth daily. 30 capsule 3   dicyclomine (BENTYL) 20 MG tablet Take 1 tablet (20 mg total) by mouth 2 (two) times daily. 20 tablet 0   ezetimibe (ZETIA) 10 MG tablet Take 1 tablet (10 mg total) by mouth daily. 90 tablet 3   fenofibrate (TRICOR) 48 MG tablet TAKE ONE TABLET BY MOUTH ONCE DAILY. 90 tablet 0   fluticasone (FLONASE) 50 MCG/ACT nasal spray PLACE 2 SPRAYS INTO BOTH NOSTRILS DAILY. 16 g 0   ibuprofen (ADVIL) 800 MG tablet Take 1 tablet (800 mg total) by mouth every 8 (eight) hours as needed. 30 tablet 0   lisinopril (ZESTRIL) 5 MG tablet Take 1 tablet (5 mg total) by mouth daily. 90 tablet 3   Multiple Vitamins-Minerals (WOMENS 50+ MULTI VITAMIN/MIN PO) Take 1 tablet by mouth daily.     omeprazole (PRILOSEC) 20 MG capsule Take 1 capsule (20 mg total) by mouth daily. 30 capsule 1   ondansetron (ZOFRAN-ODT) 4 MG disintegrating tablet Take 1 tablet (4 mg total) by mouth every 8 (eight) hours as needed for nausea or vomiting. 20 tablet 0   RESTASIS 0.05 % ophthalmic emulsion 1 drop 2 (two) times daily.     traMADol (ULTRAM) 50 MG tablet Take 1 tablet (50 mg total) by mouth every 6 (six) hours as needed. 20 tablet 0   sucralfate (CARAFATE) 1 g tablet Take 1 tablet (1 g total) by mouth with breakfast, with lunch, and  with evening meal for 20 days. 60 tablet 0   No facility-administered medications prior to visit.    Allergies  Allergen Reactions   Aspirin Hives    ROS Review of Systems  Constitutional:  Negative for chills and fever.  Eyes:  Negative for visual disturbance.  Respiratory:  Negative for chest tightness and shortness of breath.   Neurological:  Positive for dizziness. Negative for headaches.      Objective:    Physical Exam HENT:     Head: Normocephalic.     Mouth/Throat:     Mouth: Mucous membranes are moist.  Cardiovascular:     Rate and Rhythm: Normal rate.     Heart sounds: Normal heart sounds.  Pulmonary:     Effort: Pulmonary effort is normal.     Breath sounds: Normal breath sounds.  Neurological:     Mental Status: She is alert.     BP 117/76   Pulse 73   Ht 5' 2.5" (1.588 m)   Wt 204 lb 0.6 oz (92.6 kg)   SpO2 92%   BMI 36.72 kg/m  Wt Readings from Last 3 Encounters:  05/26/23 204 lb 0.6 oz (92.6 kg)  02/28/23 192 lb (87.1 kg)  01/29/23 203 lb 1.9 oz (92.1 kg)    Lab Results  Component Value Date   TSH 3.740 10/11/2022   Lab Results  Component Value Date   WBC 9.0 02/28/2023   HGB 12.7 02/28/2023   HCT 36.1 02/28/2023   MCV 89.6 02/28/2023   PLT 290 02/28/2023   Lab Results  Component Value Date   NA 131 (L) 02/28/2023   K 3.6 02/28/2023   CO2 22 02/28/2023   GLUCOSE 92 02/28/2023   BUN 9 02/28/2023   CREATININE 1.16 (H) 02/28/2023   BILITOT 0.7 02/28/2023   ALKPHOS 65 02/28/2023   AST 47 (H) 02/28/2023   ALT 24 02/28/2023   PROT 7.9 02/28/2023   ALBUMIN 3.7 02/28/2023   CALCIUM 8.9 02/28/2023  ANIONGAP 10 02/28/2023   EGFR 95 10/11/2022   Lab Results  Component Value Date   CHOL 201 (H) 10/11/2022   Lab Results  Component Value Date   HDL 40 10/11/2022   Lab Results  Component Value Date   LDLCALC 116 (H) 10/11/2022   Lab Results  Component Value Date   TRIG 258 (H) 10/11/2022   Lab Results  Component Value  Date   CHOLHDL 5.0 (H) 10/11/2022   Lab Results  Component Value Date   HGBA1C 5.6 10/11/2022      Assessment & Plan:  Primary hypertension Assessment & Plan: Controlled Encouraged to continue taking amlodipine 10 mg daily and lisinopril 5 mg  Encouraged low-sodium diet with increased physical activities Dash diet reviewed BP Readings from Last 3 Encounters:  05/26/23 117/76  03/01/23 122/73  01/29/23 124/80     Orders: -     TSH + free T4 -     CMP14+EGFR -     CBC with Differential/Platelet  Other hyperlipidemia Assessment & Plan: Encouraged to continue taking atorvastatin 80 mg daily, ezetimibe 10 mg daily, fenofibrate 48 mg daily, and omega-3 fatty acid 2 g twice daily Encouraged decreasing her intake of greasy, fatty, starchy foods with increase physical activity Lab Results  Component Value Date   CHOL 201 (H) 10/11/2022   HDL 40 10/11/2022   LDLCALC 116 (H) 10/11/2022   TRIG 258 (H) 10/11/2022   CHOLHDL 5.0 (H) 10/11/2022     Orders: -     Lipid panel  Dizziness Assessment & Plan: Encouraged to increase her fluid intake at least 64 ounces daily, change positions slowly and maintain a heart healthy diet. Pending CBC,TSH and BMP   Gastroesophageal reflux disease without esophagitis Assessment & Plan: Encouraged to continue Protonix 40 mg daily GERD diet encouraged   IFG (impaired fasting glucose) -     Hemoglobin A1c  Vitamin D deficiency -     VITAMIN D 25 Hydroxy (Vit-D Deficiency, Fractures)  Note: This chart has been completed using Engineer, civil (consulting) software, and while attempts have been made to ensure accuracy, certain words and phrases may not be transcribed as intended.    Follow-up: Return in about 3 months (around 08/26/2023).   Gilmore Laroche, FNP

## 2023-05-26 NOTE — Assessment & Plan Note (Signed)
Controlled Encouraged to continue taking amlodipine 10 mg daily and lisinopril 5 mg  Encouraged low-sodium diet with increased physical activities Dash diet reviewed BP Readings from Last 3 Encounters:  05/26/23 117/76  03/01/23 122/73  01/29/23 124/80

## 2023-05-26 NOTE — Patient Instructions (Addendum)
I appreciate the opportunity to provide care to you today!    Follow up:  4 months  Fasting Labs: please stop by the lab today/during the week to get your blood drawn (CBC, CMP, TSH, Lipid profile, HgA1c, Vit D)  I recommend: -Staying well-hydrated by drinking at least 64 ounces of water daily -Moving slowly. Avoid sudden body or head movements or certain positions, as told by your health care provider. -Avoiding driving or operating machinery until your health care provider says it is safe. -Avoiding doing any tasks that would be dangerous to you or others if vertigo occurs. --If you have trouble walking or keeping your balance, try using a cane for stability. If you feel dizzy or unstable, sit down right away.  Epley Maneuver The Epley Maneuver is a common and effective treatment for Benign Paroxysmal Positional Vertigo (BPPV), particularly for cases involving the posterior semicircular canal. It aims to reposition the dislodged otoliths (calcium carbonate crystals) that cause vertigo into a location where they no longer provoke symptoms.  Stop by sitting upright on the bed with the leg extended To your head 45 degrees to the affected side the cycles and the vertigo Lie down quickly on your back with your head still turned 45 degrees, letting it hang slightly off the edge of the bed(about 20 degrees below the bed).  Stay in this position for about 30 seconds to 1 minute or until the vertigo. Turning her head 90 degrees to the opposite side(so it is not 45 degrees to the other side), keeping your body still.  Hold this position for 30 seconds to 1 minute Will your body onto your sides to your head is not turned down, facing the floor, still at a 45 degree angle.  Hold this for 30 seconds to 1 minute Slowly return to the sitting position, bring your head forward After the maneuver, remain upright for several hours or even to sleep with the head slightly elevated to help decrease to stay in  place Avoid lying flat or bending over for the rest of the day and to sleep with their head slightly elevated.   Attached with your AVS, you will find valuable resources for self-education. I highly recommend dedicating some time to thoroughly examine them.   Please continue to a heart-healthy diet and increase your physical activities. Try to exercise for at least five days a week.    It was a pleasure to see you and I look forward to continuing to work together on your health and well-being. Please do not hesitate to call the office if you need care or have questions about your care.  In case of emergency, please visit the Emergency Department for urgent care, or contact our clinic at [phone] to schedule an appointment. We're here to help you!   Have a wonderful day and week. With Gratitude, Gilmore Laroche MSN, FNP-BC

## 2023-05-27 LAB — LIPID PANEL
Chol/HDL Ratio: 5 {ratio} — ABNORMAL HIGH (ref 0.0–4.4)
Cholesterol, Total: 171 mg/dL (ref 100–199)
HDL: 34 mg/dL — ABNORMAL LOW (ref >39)
LDL Chol Calc (NIH): 101 mg/dL — ABNORMAL HIGH (ref 0–99)
Triglycerides: 205 mg/dL — ABNORMAL HIGH (ref 0–149)
VLDL Cholesterol Cal: 36 mg/dL (ref 5–40)

## 2023-05-27 LAB — TSH+FREE T4
Free T4: 1.24 ng/dL (ref 0.82–1.77)
TSH: 3.58 u[IU]/mL (ref 0.450–4.500)

## 2023-05-27 LAB — CBC WITH DIFFERENTIAL/PLATELET
Basophils Absolute: 0 10*3/uL (ref 0.0–0.2)
Basos: 1 %
EOS (ABSOLUTE): 0.1 10*3/uL (ref 0.0–0.4)
Eos: 2 %
Hematocrit: 41.2 % (ref 34.0–46.6)
Hemoglobin: 13.1 g/dL (ref 11.1–15.9)
Immature Grans (Abs): 0 10*3/uL (ref 0.0–0.1)
Immature Granulocytes: 1 %
Lymphocytes Absolute: 1.4 10*3/uL (ref 0.7–3.1)
Lymphs: 24 %
MCH: 30.4 pg (ref 26.6–33.0)
MCHC: 31.8 g/dL (ref 31.5–35.7)
MCV: 96 fL (ref 79–97)
Monocytes Absolute: 0.6 10*3/uL (ref 0.1–0.9)
Monocytes: 10 %
Neutrophils Absolute: 3.8 10*3/uL (ref 1.4–7.0)
Neutrophils: 62 %
Platelets: 365 10*3/uL (ref 150–450)
RBC: 4.31 x10E6/uL (ref 3.77–5.28)
RDW: 12.3 % (ref 11.7–15.4)
WBC: 6 10*3/uL (ref 3.4–10.8)

## 2023-05-27 LAB — CMP14+EGFR
ALT: 12 [IU]/L (ref 0–32)
AST: 24 [IU]/L (ref 0–40)
Albumin: 3.4 g/dL — ABNORMAL LOW (ref 3.9–4.9)
Alkaline Phosphatase: 100 [IU]/L (ref 44–121)
BUN/Creatinine Ratio: 5 — ABNORMAL LOW (ref 12–28)
BUN: 3 mg/dL — ABNORMAL LOW (ref 8–27)
Bilirubin Total: 0.5 mg/dL (ref 0.0–1.2)
CO2: 25 mmol/L (ref 20–29)
Calcium: 9.5 mg/dL (ref 8.7–10.3)
Chloride: 103 mmol/L (ref 96–106)
Creatinine, Ser: 0.61 mg/dL (ref 0.57–1.00)
Globulin, Total: 3.2 g/dL (ref 1.5–4.5)
Glucose: 91 mg/dL (ref 70–99)
Potassium: 2.9 mmol/L — ABNORMAL LOW (ref 3.5–5.2)
Sodium: 142 mmol/L (ref 134–144)
Total Protein: 6.6 g/dL (ref 6.0–8.5)
eGFR: 102 mL/min/{1.73_m2} (ref >59)

## 2023-05-27 LAB — HEMOGLOBIN A1C
Est. average glucose Bld gHb Est-mCnc: 105 mg/dL
Hgb A1c MFr Bld: 5.3 % (ref 4.8–5.6)

## 2023-05-27 LAB — VITAMIN D 25 HYDROXY (VIT D DEFICIENCY, FRACTURES): Vit D, 25-Hydroxy: 27 ng/mL — ABNORMAL LOW (ref 30.0–100.0)

## 2023-06-03 ENCOUNTER — Other Ambulatory Visit: Payer: Self-pay

## 2023-06-03 ENCOUNTER — Other Ambulatory Visit: Payer: Self-pay | Admitting: Family Medicine

## 2023-06-03 DIAGNOSIS — E876 Hypokalemia: Secondary | ICD-10-CM

## 2023-06-03 DIAGNOSIS — J0120 Acute ethmoidal sinusitis, unspecified: Secondary | ICD-10-CM

## 2023-06-03 DIAGNOSIS — E559 Vitamin D deficiency, unspecified: Secondary | ICD-10-CM

## 2023-06-03 MED ORDER — FLUTICASONE PROPIONATE 50 MCG/ACT NA SUSP: 2.0000 | Freq: Every day | NASAL | 0 refills | Status: AC

## 2023-06-03 MED ORDER — VITAMIN D (ERGOCALCIFEROL) 1.25 MG (50000 UNIT) PO CAPS
50000.0000 [IU] | ORAL_CAPSULE | ORAL | 1 refills | Status: DC
Start: 2023-06-03 — End: 2024-05-18

## 2023-06-03 MED ORDER — POTASSIUM CHLORIDE CRYS ER 10 MEQ PO TBCR: 10.0000 meq | EXTENDED_RELEASE_TABLET | Freq: Two times a day (BID) | ORAL | 1 refills | Status: DC

## 2023-06-03 NOTE — Progress Notes (Signed)
Please inform the patient that her potassium levels are moderately low, and I recommend going to the ED. Symptoms of hypokalemia include muscle weakness, cramping, and an irregular heartbeat. I've sent a prescription for potassium 10 mEq BID to take. Please  encourage the pt to return for labs in a week to reassess her levels. Her cholesterol levels have improved, so please encourage the pt to continue her current treatment regimen. A prescription for a weekly vitamin D supplement has been sent to the pharmacy because her vitamin D is slightly low.

## 2023-06-06 ENCOUNTER — Ambulatory Visit (HOSPITAL_COMMUNITY): Payer: Medicare HMO | Attending: Physical Medicine and Rehabilitation

## 2023-06-06 DIAGNOSIS — Z79899 Other long term (current) drug therapy: Secondary | ICD-10-CM | POA: Diagnosis not present

## 2023-06-06 DIAGNOSIS — G8929 Other chronic pain: Secondary | ICD-10-CM | POA: Diagnosis not present

## 2023-06-06 DIAGNOSIS — Z6836 Body mass index (BMI) 36.0-36.9, adult: Secondary | ICD-10-CM | POA: Diagnosis not present

## 2023-06-06 DIAGNOSIS — M5442 Lumbago with sciatica, left side: Secondary | ICD-10-CM | POA: Insufficient documentation

## 2023-06-06 DIAGNOSIS — R262 Difficulty in walking, not elsewhere classified: Secondary | ICD-10-CM | POA: Diagnosis not present

## 2023-06-06 DIAGNOSIS — M5441 Lumbago with sciatica, right side: Secondary | ICD-10-CM | POA: Diagnosis not present

## 2023-06-06 DIAGNOSIS — M545 Low back pain, unspecified: Secondary | ICD-10-CM | POA: Diagnosis not present

## 2023-06-06 DIAGNOSIS — M25562 Pain in left knee: Secondary | ICD-10-CM | POA: Diagnosis not present

## 2023-06-06 DIAGNOSIS — M25561 Pain in right knee: Secondary | ICD-10-CM | POA: Diagnosis not present

## 2023-06-08 NOTE — Therapy (Signed)
OUTPATIENT PHYSICAL THERAPY EVALUATION   Patient Name: [name] MRN: [id number] DOB:25-Dec-1960, 62 y.o., female Today's Date: 06/08/2023  END OF SESSION:  PT End of Session - 06/08/23 0728     Visit Number 1    Number of Visits 12    Date for PT Re-Evaluation 07/18/23    Authorization Type [employer] Medicare HMO/PPO; Medicaid of [place] 2ndary    Authorization Time Period 06/06/23-07/18/23    Progress Note Due on Visit 8    PT Start Time 1015    PT Stop Time 1055    PT Time Calculation (min) 40 min    Activity Tolerance Patient tolerated treatment well;No increased pain    Behavior During Therapy [facility] for tasks assessed/performed;Anxious             Past Medical History:  Diagnosis Date   Anemia    Anxiety    Arthritis    back, hands , knees    Chest pain 2010   due to sarcoidosis- per pt.    Chronic pain    Depression    GERD (gastroesophageal reflux disease)    Hypercholesterolemia    Hypertension    Pneumonia    sarcoidosis- in remission    Pulmonary sarcoidosis (HCC)    Treatment with Prednisone   Past Surgical History:  Procedure Laterality Date   ABDOMINAL HYSTERECTOMY     APPENDECTOMY     CHOLECYSTECTOMY     EXCISION MASS ABDOMINAL N/A 03/20/2020   Procedure: Excision of anterior abdominal wall fat necrosis;  Surgeon: Allena Napoleon, MD;  Location: [facility];  Service: Plastics;  Laterality: N/A;   HERNIA REPAIR     INCISIONAL HERNIA REPAIR N/A 07/01/2019   Procedure: OPEN INCISIONAL HERNIA REPAIR WITH MESH;  Surgeon: Allison, De Blanch, MD;  Location: [facility] OR;  Service: General;  Laterality: N/A;   INCISIONAL HERNIA REPAIR N/A 02/20/2021   Procedure: LAPAROSCOPIC RECURRENT INCISIONAL HERNIA REPAIR WITH MESH;  Surgeon: Allison Hatch De Blanch, MD;  Location: [facility];  Service: General;  Laterality: N/A;   INSERTION OF MESH N/A 07/01/2019   Procedure: Insertion Of Mesh;  Surgeon: Allison, De Blanch, MD;  Location: [facility] OR;  Service: General;   Laterality: N/A;   LAPAROSCOPIC GASTRIC SLEEVE RESECTION N/A 02/15/2019   Procedure: LAPAROSCOPIC GASTRIC SLEEVE RESECTION, UPPER ENDO, ERAS Pathway, Hiatal Hernia Repair;  Surgeon: Allison, De Blanch, MD;  Location: [facility];  Service: General;  Laterality: N/A;   LAPAROSCOPY N/A 02/17/2019   Procedure: LAPAROSCOPY DIAGNOSTIC CONVERTED TO OPEN, EVACUATION OF HEMATOMA AND LIGATION OF VESSEL;  Surgeon: Allison, De Blanch, MD;  Location: [facility];  Service: General;  Laterality: N/A;   LAPAROSCOPY N/A 07/01/2019   Procedure: Laparoscopy Diagnostic;  Surgeon: Allison Hatch De Blanch, MD;  Location: [facility] OR;  Service: General;  Laterality: N/A;   SMALL INTESTINE SURGERY     Patient Active Problem List   Diagnosis Date Noted   Dizziness 05/26/2023   Chronic pain 01/01/2023   Epigastric pain 11/14/2022   Need for immunization against influenza 05/29/2022   History of gastric bypass 02/06/2022   Current every day smoker 09/12/2021   Hypertension 07/12/2021   Vitamin D deficiency 07/12/2021   Aortic atherosclerosis (HCC) 07/12/2021   Incisional hernia 07/01/2019   Postoperative nausea and vomiting 02/24/2019   Back pain 11/19/2016   GASTROESOPHAGEAL REFLUX DISEASE 06/26/2009   SARCOIDOSIS, PULMONARY 06/23/2009    PCP: Allison Laroche, FNP  REFERRING PROVIDER: Ellwood Sayers, MD (pain management)   REFERRING DIAG: Low back  pain   Rationale for Evaluation and Treatment: Rehabilitation   THERAPY DIAG:  Chronic midline low back pain with bilateral sciatica  Difficulty in walking, not elsewhere classified  ONSET DATE: Acute on chronic >5 years  SUBJECTIVE:                                                                                                                                                                                           SUBJECTIVE STATEMENT: Pt reports ongoing pain in low back, quite low, near top of gluteal cleft, reports very limiting to her ability to maintain her  home.   PERTINENT HISTORY:  [name] is a 61yoF who is referred by pain management to OPPT with chronic low back pain. Pt reports chronic pain in bilat hips, knees, ankles, is unable to say if any of these are related to her back pain. Pt previously seen by our services in 2018 with similar issue. Pt reports she is disabled, at home most days, struggles through her housework tasks (making the bed) despite severe pain, has found ways to adapt many tasks to a sitting set up (cleaning the bathroom, washing dishes). PMH: sarcoidosis with related pain in many areas.  Current pain: 8/10 Worst pain: 9.5/10 Aggravating factors: bending forward, vacuuming, sitting too long. Alleviating factors: very hot showers, daily use of icy hot; takes hydrocodone PRN.   PAIN:  Are you having pain? 8/10 (just above the gluteal cleft)   PRECAUTIONS: None  WEIGHT BEARING RESTRICTIONS: None   FALLS:  Has patient fallen in last 6 months? No  LIVING ENVIRONMENT: Lives with: husband  Stairs: lives on main level, no need to access 2nd floor on regular, no entry stairs  OCCUPATION: on disability  PLOF: modified independent  PATIENT GOALS: improved pain control, improved ability to manage homecare tasks   NEXT MD VISIT: unknown  OBJECTIVE:  Note: Objective measures were completed at Evaluation unless otherwise noted.  DIAGNOSTIC FINDINGS:  Left Lumbar MRI 2018:  "IMPRESSION: Progressive degenerative disease at L3-4 where there are new bilateral facet joint effusions and a shallow disc bulge. The central canal remains open but mild to moderate bilateral foraminal narrowing has slightly worsened since the prior MRI.   Progressive spondylosis at L4-5 since the prior MRI where there is a new shallow left paracentral protrusion superimposed on a small disc bulge. Left greater than right subarticular recess narrowing is present which could irritate either descending L5 root."  PATIENT SURVEYS:   FOTO: 31  COGNITION: Overall cognitive status: Within functional limits for tasks assessed     SENSATION: Pt reports intact  PALPATION: Deferred to later visit PRN  LUMBAR PHYSIOLOGICAL MOVEMENT:   AROM Eval (06/06/23)  Flexion (seated forward flexion) Slight maintained lordosis)  Extension deferred  Right lateral flexion deferred  Left lateral flexion deferred  Right rotation deferred  Left rotation deferred     FUNCTIONAL TESTS:  -5xSTS: 15sec hands free  - : 0.1m/s, fairly antalgic, increases pain   TODAY'S TREATMENT:                                                                                                                              DATE:  -Evaluation only; -HEP education   PATIENT EDUCATION:  Education details: importance of self pacing for home tasks, modification of others;   -HEP activities Person educated: patient Education method: explanation Education comprehension: pt confirms  HOME EXERCISE PROGRAM: Access Code: [email or web address] URL: [email or web address] Date: 06/08/2023 Prepared by: Alvera Novel  Exercises - Supine Lower Trunk Rotation  - 1 x daily - 7 x weekly - 3 sets - 10 reps - Supine Single Knee to Chest Stretch  - 1 x daily - 7 x weekly - 3 sets - 10 reps - Supine March  - 1 x daily - 7 x weekly - 3 sets - 10 reps  ASSESSMENT:  CLINICAL IMPRESSION: 61yoF referred to OPPT for acute on chronic low back pain, also with several other areas of chronic pain. Exam showing performance of , 5xSTS, tolerance of daily mobility all falling short of age matched norm. Pt tolerates gentle mobility program this date, issued for home performance. FOTO survey reveals significant difficulty reported in tolerance of ADL/IADL mobility tasks. Pt has increased pain during short AMB distances in evaluation.    OBJECTIVE IMPAIRMENTS: Abnormal gait, cardiopulmonary status limiting activity, decreased activity tolerance, decreased balance,  decreased cognition, decreased knowledge of condition, decreased knowledge of use of DME, decreased mobility, difficulty walking, decreased ROM, and decreased strength  ACTIVITY LIMITATIONS: carrying, lifting, bending, sitting, standing, squatting, transfers, and bed mobility  PARTICIPATION LIMITATIONS: meal prep, cleaning, laundry, personal finances, interpersonal relationship, driving, shopping, community activity, occupation, and yard work  PERSONAL FACTORS: Age, Behavior pattern, Fitness, Past/current experiences, Profession, Sex, Social background, Time since onset of injury/illness/exacerbation, and Transportation are also affecting patient's functional outcome.   REHAB POTENTIAL: Good  CLINICAL DECISION MAKING: Unstable/unpredictable  EVALUATION COMPLEXITY: Moderate   GOALS: Goals reviewed with patient? No  SHORT TERM GOALS: Target date: 07/20/23  Pt to report tolerance for AMB 2-3x daily up to 5 minutes to improve general wellness/fitness.  Baseline: only walks to door to car (farthest distance) as needed Goal status: INITIAL  2.  Pt to demonstrate 542ft continuous AMB >0.81m/s to indicate ability to perform limited community distances at appropriate speeds for energy conservation.  Baseline: not going out ad lib unless necessary  Goal status: INITIAL  3.  Pt to demonstrate 5xSTS hands free <12sec without pain exacerbation.  Baseline: 15sec  Goal status: INITIAL  LONG TERM GOALS: Target date: 07/18/23  Pt to improve FOTO score >  50 to indicated reduced difficulty with performance of mobility for household tasks.  Baseline: eval: 31 Goal status: INITIAL  2.  Pt to demonstrate AMB tolerance >1014ft @ >0.38m/s to improve ability to access community as needed for leisure and social participation.  Baseline:  Goal status: INITIAL  3.  Pt to reports performance of and consistency with regular HEP to help with long-term pain management maintenance and avoidance of  deconditioning in strength.  Baseline:  Goal status: INITIAL  4.  Pt to demonstrate self selected gait speed over 10MWT>1.80m/s to indicated improved AMB toelerance in household.  Baseline: 0.58m/s Goal status: INITIAL PLAN:  PT FREQUENCY: 2x/week  PT DURATION: 6 weeks  PLANNED INTERVENTIONS: 97110-Therapeutic exercises, 97530- Therapeutic activity, [id number]- Neuromuscular re-education, 97535- Self Care, 91478- Manual therapy, [phone]- Gait training, [phone]- Orthotic Fit/training, 97014- Electrical stimulation (unattended), [phone]- Electrical stimulation (manual), Patient/Family education, Balance training, Joint mobilization, Spinal manipulation, Spinal mobilization, and Moist heat.  PLAN FOR NEXT SESSION: FU on HEP performance, any remaining examination measures as seen fit; core strengthening, pain control    Edward Trevino C, PT 06/08/2023, 7:31 AM   Rosamaria Lints, PT Physical Therapist [facility] [place]  [phone]

## 2023-06-09 ENCOUNTER — Ambulatory Visit (HOSPITAL_COMMUNITY): Payer: Medicare HMO

## 2023-06-09 ENCOUNTER — Encounter (HOSPITAL_COMMUNITY): Payer: Self-pay

## 2023-06-09 DIAGNOSIS — M5442 Lumbago with sciatica, left side: Secondary | ICD-10-CM | POA: Diagnosis not present

## 2023-06-09 DIAGNOSIS — G8929 Other chronic pain: Secondary | ICD-10-CM

## 2023-06-09 DIAGNOSIS — R262 Difficulty in walking, not elsewhere classified: Secondary | ICD-10-CM

## 2023-06-09 DIAGNOSIS — M5441 Lumbago with sciatica, right side: Secondary | ICD-10-CM | POA: Diagnosis not present

## 2023-06-09 NOTE — Therapy (Signed)
OUTPATIENT PHYSICAL THERAPY TREATMENT   Patient Name: [name] MRN: [id number] DOB:08-Apr-1961, 62 y.o., female Today's Date: 06/09/2023  END OF SESSION:  PT End of Session - 06/09/23 0953     Visit Number 2    Number of Visits 12    Date for PT Re-Evaluation 07/18/23    Authorization Type [employer]na Medicare HMO/PPO; Medicaid of [place] 2ndary    Authorization Time Period 06/06/23-07/18/23    Progress Note Due on Visit 8    PT Start Time 0848    PT Stop Time 0930    PT Time Calculation (min) 42 min    Activity Tolerance Patient tolerated treatment well;No increased pain;Patient limited by pain    Behavior During Therapy [facility] for tasks assessed/performed              Past Medical History:  Diagnosis Date   Anemia    Anxiety    Arthritis    back, hands , knees    Chest pain 2010   due to sarcoidosis- per pt.    Chronic pain    Depression    GERD (gastroesophageal reflux disease)    Hypercholesterolemia    Hypertension    Pneumonia    sarcoidosis- in remission    Pulmonary sarcoidosis (HCC)    Treatment with Prednisone   Past Surgical History:  Procedure Laterality Date   ABDOMINAL HYSTERECTOMY     APPENDECTOMY     CHOLECYSTECTOMY     EXCISION MASS ABDOMINAL N/A 03/20/2020   Procedure: Excision of anterior abdominal wall fat necrosis;  Surgeon: Allena Napoleon, MD;  Location: [facility];  Service: Plastics;  Laterality: N/A;   HERNIA REPAIR     INCISIONAL HERNIA REPAIR N/A 07/01/2019   Procedure: OPEN INCISIONAL HERNIA REPAIR WITH MESH;  Surgeon: Kinsinger, De Blanch, MD;  Location: [facility] OR;  Service: General;  Laterality: N/A;   INCISIONAL HERNIA REPAIR N/A 02/20/2021   Procedure: LAPAROSCOPIC RECURRENT INCISIONAL HERNIA REPAIR WITH MESH;  Surgeon: Sheliah Hatch De Blanch, MD;  Location: [facility];  Service: General;  Laterality: N/A;   INSERTION OF MESH N/A 07/01/2019   Procedure: Insertion Of Mesh;  Surgeon: Kinsinger, De Blanch, MD;  Location: [facility] OR;   Service: General;  Laterality: N/A;   LAPAROSCOPIC GASTRIC SLEEVE RESECTION N/A 02/15/2019   Procedure: LAPAROSCOPIC GASTRIC SLEEVE RESECTION, UPPER ENDO, ERAS Pathway, Hiatal Hernia Repair;  Surgeon: Kinsinger, De Blanch, MD;  Location: [facility];  Service: General;  Laterality: N/A;   LAPAROSCOPY N/A 02/17/2019   Procedure: LAPAROSCOPY DIAGNOSTIC CONVERTED TO OPEN, EVACUATION OF HEMATOMA AND LIGATION OF VESSEL;  Surgeon: Kinsinger, De Blanch, MD;  Location: [facility];  Service: General;  Laterality: N/A;   LAPAROSCOPY N/A 07/01/2019   Procedure: Laparoscopy Diagnostic;  Surgeon: Sheliah Hatch De Blanch, MD;  Location: [facility] OR;  Service: General;  Laterality: N/A;   SMALL INTESTINE SURGERY     Patient Active Problem List   Diagnosis Date Noted   Dizziness 05/26/2023   Chronic pain 01/01/2023   Epigastric pain 11/14/2022   Need for immunization against influenza 05/29/2022   History of gastric bypass 02/06/2022   Current every day smoker 09/12/2021   Hypertension 07/12/2021   Vitamin D deficiency 07/12/2021   Aortic atherosclerosis (HCC) 07/12/2021   Incisional hernia 07/01/2019   Postoperative nausea and vomiting 02/24/2019   Back pain 11/19/2016   GASTROESOPHAGEAL REFLUX DISEASE 06/26/2009   SARCOIDOSIS, PULMONARY 06/23/2009    PCP: Gilmore Laroche, FNP  REFERRING PROVIDER: Ellwood Sayers, MD (pain management)  REFERRING DIAG: Low back pain   Rationale for Evaluation and Treatment: Rehabilitation   THERAPY DIAG:  Chronic midline low back pain with bilateral sciatica  Difficulty in walking, not elsewhere classified  ONSET DATE: Acute on chronic >5 years  SUBJECTIVE:                                                                                                                                                                                           SUBJECTIVE STATEMENT: 06/09/23  Pt reports constant sharp and sore pain in center of lower back that goes out to sides, pain scale  9/10.  Has began HEP without questions.  Eval:  Pt reports ongoing pain in low back, quite low, near top of gluteal cleft, reports very limiting to her ability to maintain her home.   PERTINENT HISTORY:  [name] is a 61yoF who is referred by pain management to OPPT with chronic low back pain. Pt reports chronic pain in bilat hips, knees, ankles, is unable to say if any of these are related to her back pain. Pt previously seen by our services in 2018 with similar issue. Pt reports she is disabled, at home most days, struggles through her housework tasks (making the bed) despite severe pain, has found ways to adapt many tasks to a sitting set up (cleaning the bathroom, washing dishes). PMH: sarcoidosis with related pain in many areas.  Current pain: 9/10 Worst pain: 9.5/10 Aggravating factors: bending forward, vacuuming, sitting too long. Alleviating factors: very hot showers, daily use of icy hot; takes hydrocodone PRN.   PAIN:  Are you having pain? 8/10 (just above the gluteal cleft)   PRECAUTIONS: None  WEIGHT BEARING RESTRICTIONS: None   FALLS:  Has patient fallen in last 6 months? No  LIVING ENVIRONMENT: Lives with: husband  Stairs: lives on main level, no need to access 2nd floor on regular, no entry stairs  OCCUPATION: on disability  PLOF: modified independent  PATIENT GOALS: improved pain control, improved ability to manage homecare tasks   NEXT MD VISIT: unknown  OBJECTIVE:  Note: Objective measures were completed at Evaluation unless otherwise noted.  DIAGNOSTIC FINDINGS:  Left Lumbar MRI 2018:  "IMPRESSION: Progressive degenerative disease at L3-4 where there are new bilateral facet joint effusions and a shallow disc bulge. The central canal remains open but mild to moderate bilateral foraminal narrowing has slightly worsened since the prior MRI.   Progressive spondylosis at L4-5 since the prior MRI where there is a new shallow left paracentral protrusion  superimposed on a small disc bulge. Left greater than right subarticular recess narrowing is present which could irritate either  descending L5 root."  PATIENT SURVEYS:  FOTO: 31  COGNITION: Overall cognitive status: Within functional limits for tasks assessed     SENSATION: Pt reports intact  PALPATION: Deferred to later visit PRN  LUMBAR PHYSIOLOGICAL MOVEMENT:   AROM Eval (06/06/23)  Flexion (seated forward flexion) Slight maintained lordosis)  Extension deferred  Right lateral flexion deferred  Left lateral flexion deferred  Right rotation deferred  Left rotation deferred     FUNCTIONAL TESTS:  -5xSTS: 15sec hands free  - : 0.54m/s, fairly antalgic, increases pain   TODAY'S TREATMENT:                                                                                                                              DATE:  06/09/23: Reviewed goals  Reviewed HEP compliance Educated log rolling Supine: LTR Marching with cueing for ab stability SKTC 3x 20" Piriformis 90/90 with towel assistance Diaphragmatic breathing x 2 min Transverse abdominis x 2 min paired with exhale Isometric add/abd alternating squeeze ball then abd into belt 20x 5" Glut sets 10x5" Clam 10x  -Evaluation only; -HEP education   PATIENT EDUCATION:  Education details: importance of self pacing for home tasks, modification of others;   -HEP activities Person educated: patient Education method: explanation Education comprehension: pt confirms  HOME EXERCISE PROGRAM: Access Code: [email or web address] URL: [email or web address] Date: 06/08/2023 Prepared by: Alvera Novel  Exercises - Supine Lower Trunk Rotation  - 1 x daily - 7 x weekly - 3 sets - 10 reps - Supine Single Knee to Chest Stretch  - 1 x daily - 7 x weekly - 3 sets - 10 reps - Supine March  - 1 x daily - 7 x weekly - 3 sets - 10 reps  06/09/23: - Supine Diaphragmatic Breathing  - 2-3 x daily - 7 x weekly - 3 sets - 10  reps - 5" hold - Seated Transversus Abdominis Bracing  - 1 x daily - 7 x weekly - 3 sets - 10 reps - Supine Transversus Abdominis Bracing - Hands on Stomach  - 1 x daily - 7 x weekly - 3 sets - 10 reps - Hooklying Gluteal Sets  - 1 x daily - 7 x weekly - 3 sets - 10 reps - Hooklying Isometric Hip Abduction Adduction with Belt and Ball  - 1 x daily - 7 x weekly - 3 sets - 10 reps  ASSESSMENT:  CLINICAL IMPRESSION: 06/09/23: Reviewed goals and educated importance of HEP compliance for maximal benefits.  Pt able to recall and demonstrate appropriate mechanics with current exercise program with min cueing to improve core stability with exercise.  Pt educated on log rolling to reduce stress on lumbar musculature and deep breathing to relax CNS.  Session focus on isometric core and proximal strengthening with min cueing to improve appropriate muscle contraction.  Pt continues to be limited by pain.   Eval:  61yoF referred to OPPT for acute on chronic low back pain,  also with several other areas of chronic pain. Exam showing performance of , 5xSTS, tolerance of daily mobility all falling short of age matched norm. Pt tolerates gentle mobility program this date, issued for home performance. FOTO survey reveals significant difficulty reported in tolerance of ADL/IADL mobility tasks. Pt has increased pain during short AMB distances in evaluation.    OBJECTIVE IMPAIRMENTS: Abnormal gait, cardiopulmonary status limiting activity, decreased activity tolerance, decreased balance, decreased cognition, decreased knowledge of condition, decreased knowledge of use of DME, decreased mobility, difficulty walking, decreased ROM, and decreased strength  ACTIVITY LIMITATIONS: carrying, lifting, bending, sitting, standing, squatting, transfers, and bed mobility  PARTICIPATION LIMITATIONS: meal prep, cleaning, laundry, personal finances, interpersonal relationship, driving, shopping, community activity, occupation,  and yard work  PERSONAL FACTORS: Age, Behavior pattern, Fitness, Past/current experiences, Profession, Sex, Social background, Time since onset of injury/illness/exacerbation, and Transportation are also affecting patient's functional outcome.   REHAB POTENTIAL: Good  CLINICAL DECISION MAKING: Unstable/unpredictable  EVALUATION COMPLEXITY: Moderate   GOALS: Goals reviewed with patient? No  SHORT TERM GOALS: Target date: 07/20/23  Pt to report tolerance for AMB 2-3x daily up to 5 minutes to improve general wellness/fitness.  Baseline: only walks to door to car (farthest distance) as needed Goal status: INITIAL  2.  Pt to demonstrate 577ft continuous AMB >0.61m/s to indicate ability to perform limited community distances at appropriate speeds for energy conservation.  Baseline: not going out ad lib unless necessary  Goal status: INITIAL  3.  Pt to demonstrate 5xSTS hands free <12sec without pain exacerbation.  Baseline: 15sec  Goal status: INITIAL  LONG TERM GOALS: Target date: 07/18/23  Pt to improve FOTO score > 50 to indicated reduced difficulty with performance of mobility for household tasks.  Baseline: eval: 31 Goal status: INITIAL  2.  Pt to demonstrate AMB tolerance >108ft @ >0.53m/s to improve ability to access community as needed for leisure and social participation.  Baseline:  Goal status: INITIAL  3.  Pt to reports performance of and consistency with regular HEP to help with long-term pain management maintenance and avoidance of deconditioning in strength.  Baseline:  Goal status: INITIAL  4.  Pt to demonstrate self selected gait speed over 10MWT>1.55m/s to indicated improved AMB toelerance in household.  Baseline: 0.67m/s Goal status: INITIAL PLAN:  PT FREQUENCY: 2x/week  PT DURATION: 6 weeks  PLANNED INTERVENTIONS: 97110-Therapeutic exercises, 97530- Therapeutic activity, [id number]- Neuromuscular re-education, 97535- Self Care, 16109- Manual therapy, [phone]-  Gait training, [phone]- Orthotic Fit/training, 97014- Electrical stimulation (unattended), [phone]- Electrical stimulation (manual), Patient/Family education, Balance training, Joint mobilization, Spinal manipulation, Spinal mobilization, and Moist heat.  PLAN FOR NEXT SESSION: FU on HEP performance, any remaining examination measures as seen fit; core strengthening, pain control   Becky Sax, LPTA/CLT; CBIS [phone]  Juel Burrow, PTA 06/09/2023, 9:53 AM

## 2023-06-11 ENCOUNTER — Encounter (HOSPITAL_COMMUNITY): Payer: Self-pay

## 2023-06-11 ENCOUNTER — Ambulatory Visit (HOSPITAL_COMMUNITY): Payer: Medicare HMO

## 2023-06-11 DIAGNOSIS — G8929 Other chronic pain: Secondary | ICD-10-CM

## 2023-06-11 DIAGNOSIS — R262 Difficulty in walking, not elsewhere classified: Secondary | ICD-10-CM

## 2023-06-11 DIAGNOSIS — M5441 Lumbago with sciatica, right side: Secondary | ICD-10-CM | POA: Diagnosis not present

## 2023-06-11 DIAGNOSIS — M5442 Lumbago with sciatica, left side: Secondary | ICD-10-CM | POA: Diagnosis not present

## 2023-06-11 NOTE — Therapy (Signed)
OUTPATIENT PHYSICAL THERAPY TREATMENT   Patient Name: [name] MRN: [id number] DOB:[birth year], 62 y.o., female Today's Date: 06/11/2023  END OF SESSION:  PT End of Session - 06/11/23 0924     Visit Number 3    Number of Visits 12    Date for PT Re-Evaluation 07/18/23    Authorization Type [employer] HMO/PPO; Medicaid of [place] [employer]    Authorization Time Period 06/06/23-07/18/23    Progress Note Due on Visit 8    PT Start Time 0919    PT Stop Time 1002    PT Time Calculation (min) 43 min    Activity Tolerance Patient limited by pain;Patient tolerated treatment well    Behavior During Therapy [facility] for tasks assessed/performed               Past Medical History:  Diagnosis Date   Anemia    Anxiety    Arthritis    back, hands , knees    Chest pain 2010   due to sarcoidosis- per pt.    Chronic pain    Depression    GERD (gastroesophageal reflux disease)    Hypercholesterolemia    Hypertension    Pneumonia    sarcoidosis- in remission    Pulmonary sarcoidosis (HCC)    Treatment with Prednisone   Past Surgical History:  Procedure Laterality Date   ABDOMINAL HYSTERECTOMY     APPENDECTOMY     CHOLECYSTECTOMY     EXCISION MASS ABDOMINAL N/A 03/20/2020   Procedure: Excision of anterior abdominal wall fat necrosis;  Surgeon: Allena Napoleon, MD;  Location: [facility];  Service: Plastics;  Laterality: N/A;   HERNIA REPAIR     INCISIONAL HERNIA REPAIR N/A 07/01/2019   Procedure: OPEN INCISIONAL HERNIA REPAIR WITH MESH;  Surgeon: Kinsinger, De Blanch, MD;  Location: [facility] OR;  Service: General;  Laterality: N/A;   INCISIONAL HERNIA REPAIR N/A 02/20/2021   Procedure: LAPAROSCOPIC RECURRENT INCISIONAL HERNIA REPAIR WITH MESH;  Surgeon: Sheliah Hatch De Blanch, MD;  Location: [facility];  Service: General;  Laterality: N/A;   INSERTION OF MESH N/A 07/01/2019   Procedure: Insertion Of Mesh;  Surgeon: Kinsinger, De Blanch, MD;  Location: [facility] OR;  Service:  General;  Laterality: N/A;   LAPAROSCOPIC GASTRIC SLEEVE RESECTION N/A 02/15/2019   Procedure: LAPAROSCOPIC GASTRIC SLEEVE RESECTION, UPPER ENDO, ERAS Pathway, Hiatal Hernia Repair;  Surgeon: Kinsinger, De Blanch, MD;  Location: [facility];  Service: General;  Laterality: N/A;   LAPAROSCOPY N/A 02/17/2019   Procedure: LAPAROSCOPY DIAGNOSTIC CONVERTED TO OPEN, EVACUATION OF HEMATOMA AND LIGATION OF VESSEL;  Surgeon: Kinsinger, De Blanch, MD;  Location: [facility];  Service: General;  Laterality: N/A;   LAPAROSCOPY N/A 07/01/2019   Procedure: Laparoscopy Diagnostic;  Surgeon: Sheliah Hatch De Blanch, MD;  Location: [facility] OR;  Service: General;  Laterality: N/A;   SMALL INTESTINE SURGERY     Patient Active Problem List   Diagnosis Date Noted   Dizziness 05/26/2023   Chronic pain 01/01/2023   Epigastric pain 11/14/2022   Need for immunization against influenza 05/29/2022   History of gastric bypass 02/06/2022   Current every day smoker 09/12/2021   Hypertension 07/12/2021   Vitamin D deficiency 07/12/2021   Aortic atherosclerosis (HCC) 07/12/2021   Incisional hernia 07/01/2019   Postoperative nausea and vomiting 02/24/2019   Back pain 11/19/2016   GASTROESOPHAGEAL REFLUX DISEASE 06/26/2009   SARCOIDOSIS, PULMONARY 06/23/2009    PCP: Gilmore Laroche, FNP  REFERRING PROVIDER: Ellwood Sayers, MD (pain management)   REFERRING  DIAG: Low back pain   Rationale for Evaluation and Treatment: Rehabilitation   THERAPY DIAG:  Chronic midline low back pain with bilateral sciatica  Difficulty in walking, not elsewhere classified  ONSET DATE: Acute on chronic >5 years  SUBJECTIVE:                                                                                                                                                                                           SUBJECTIVE STATEMENT: 06/11/23:  Reports she had a rough night last night, pain scale 10/10 LBP.  Doesn't feel she needs to go to ER today.  Has  began HEP without questions.  Eval:  Pt reports ongoing pain in low back, quite low, near top of gluteal cleft, reports very limiting to her ability to maintain her home.   PERTINENT HISTORY:  [name] is a 61yoF who is referred by pain management to OPPT with chronic low back pain. Pt reports chronic pain in bilat hips, knees, ankles, is unable to say if any of these are related to her back pain. Pt previously seen by our services in 2018 with similar issue. Pt reports she is disabled, at home most days, struggles through her housework tasks (making the bed) despite severe pain, has found ways to adapt many tasks to a sitting set up (cleaning the bathroom, washing dishes). PMH: sarcoidosis with related pain in many areas.  Current pain: 9/10 Worst pain: 9.5/10 Aggravating factors: bending forward, vacuuming, sitting too long. Alleviating factors: very hot showers, daily use of icy hot; takes hydrocodone PRN.   PAIN:  Are you having pain? 8/10 (just above the gluteal cleft)   PRECAUTIONS: None  WEIGHT BEARING RESTRICTIONS: None   FALLS:  Has patient fallen in last 6 months? No  LIVING ENVIRONMENT: Lives with: husband  Stairs: lives on main level, no need to access 2nd floor on regular, no entry stairs  OCCUPATION: on disability  PLOF: modified independent  PATIENT GOALS: improved pain control, improved ability to manage homecare tasks   NEXT MD VISIT: unknown  OBJECTIVE:  Note: Objective measures were completed at Evaluation unless otherwise noted.  DIAGNOSTIC FINDINGS:  Left Lumbar MRI 2018:  "IMPRESSION: Progressive degenerative disease at L3-4 where there are new bilateral facet joint effusions and a shallow disc bulge. The central canal remains open but mild to moderate bilateral foraminal narrowing has slightly worsened since the prior MRI.   Progressive spondylosis at L4-5 since the prior MRI where there is a new shallow left paracentral protrusion  superimposed on a small disc bulge. Left greater than right subarticular recess narrowing is present which could irritate  either descending L5 root."  PATIENT SURVEYS:  FOTO: 31  COGNITION: Overall cognitive status: Within functional limits for tasks assessed     SENSATION: Pt reports intact  PALPATION: Deferred to later visit PRN  LUMBAR PHYSIOLOGICAL MOVEMENT:   AROM Eval (06/06/23)  Flexion (seated forward flexion) Slight maintained lordosis)  Extension deferred  Right lateral flexion deferred  Left lateral flexion deferred  Right rotation deferred  Left rotation deferred     FUNCTIONAL TESTS:  -5xSTS: 15sec hands free  - : 0.31m/s, fairly antalgic, increases pain   TODAY'S TREATMENT:                                                                                                                              DATE:  06/11/23: Educated on Estim for pain control:  IFC x 10 min with intesity at 31 with MHP x during therex Supine: LTR SKTC 3x 20" DKTC 2x 20" Transverse abdominis x 2 min paired with exhale Posterior pelvic tilt x 1 min Marching with ab set 10x 5" Glut set  Sidelying clam with RTB  06/09/23: Reviewed goals  Reviewed HEP compliance Educated log rolling Supine: LTR Marching with cueing for ab stability SKTC 3x 20" Piriformis 90/90 with towel assistance Diaphragmatic breathing x 2 min Transverse abdominis x 2 min paired with exhale Isometric add/abd alternating squeeze ball then abd into belt 20x 5" Glut sets 10x5" Clam 10x  -Evaluation only; -HEP education   PATIENT EDUCATION:  Education details: importance of self pacing for home tasks, modification of others;   -HEP activities Person educated: patient Education method: explanation Education comprehension: pt confirms  HOME EXERCISE PROGRAM: Access Code: [email or web address] URL: [email or web address] Date: 06/08/2023 Prepared by: Alvera Novel  Exercises - Supine  Lower Trunk Rotation  - 1 x daily - 7 x weekly - 3 sets - 10 reps - Supine Single Knee to Chest Stretch  - 1 x daily - 7 x weekly - 3 sets - 10 reps - Supine March  - 1 x daily - 7 x weekly - 3 sets - 10 reps  06/09/23: - Supine Diaphragmatic Breathing  - 2-3 x daily - 7 x weekly - 3 sets - 10 reps - 5" hold - Seated Transversus Abdominis Bracing  - 1 x daily - 7 x weekly - 3 sets - 10 reps - Supine Transversus Abdominis Bracing - Hands on Stomach  - 1 x daily - 7 x weekly - 3 sets - 10 reps - Hooklying Gluteal Sets  - 1 x daily - 7 x weekly - 3 sets - 10 reps - Hooklying Isometric Hip Abduction Adduction with Belt and Ball  - 1 x daily - 7 x weekly - 3 sets - 10 reps  ASSESSMENT:  CLINICAL IMPRESSION: 06/11/23 Pt limited by high pain scale 10/10 at entrance but does not feel need for ER.  Session focus with core and proximal strengthening for core stability.  Pt educated on estimulation  for pain control, paired with heat for therex this session with reports of pain reduced.  Educated on TENS unit for personal pain relief.  Pt presents with improved mobility and pain reduced following IFC with reports of pain at 8/10 at EOS.  Added resistance with gluteal strengthening that was tolerated well.  Eval:  61yoF referred to OPPT for acute on chronic low back pain, also with several other areas of chronic pain. Exam showing performance of , 5xSTS, tolerance of daily mobility all falling short of age matched norm. Pt tolerates gentle mobility program this date, issued for home performance. FOTO survey reveals significant difficulty reported in tolerance of ADL/IADL mobility tasks. Pt has increased pain during short AMB distances in evaluation.    OBJECTIVE IMPAIRMENTS: Abnormal gait, cardiopulmonary status limiting activity, decreased activity tolerance, decreased balance, decreased cognition, decreased knowledge of condition, decreased knowledge of use of DME, decreased mobility, difficulty  walking, decreased ROM, and decreased strength  ACTIVITY LIMITATIONS: carrying, lifting, bending, sitting, standing, squatting, transfers, and bed mobility  PARTICIPATION LIMITATIONS: meal prep, cleaning, laundry, personal finances, interpersonal relationship, driving, shopping, community activity, occupation, and yard work  PERSONAL FACTORS: Age, Behavior pattern, Fitness, Past/current experiences, Profession, Sex, Social background, Time since onset of injury/illness/exacerbation, and Transportation are also affecting patient's functional outcome.   REHAB POTENTIAL: Good  CLINICAL DECISION MAKING: Unstable/unpredictable  EVALUATION COMPLEXITY: Moderate   GOALS: Goals reviewed with patient? No  SHORT TERM GOALS: Target date: 07/20/23  Pt to report tolerance for AMB 2-3x daily up to 5 minutes to improve general wellness/fitness.  Baseline: only walks to door to car (farthest distance) as needed Goal status: INITIAL  2.  Pt to demonstrate 582ft continuous AMB >0.76m/s to indicate ability to perform limited community distances at appropriate speeds for energy conservation.  Baseline: not going out ad lib unless necessary  Goal status: INITIAL  3.  Pt to demonstrate 5xSTS hands free <12sec without pain exacerbation.  Baseline: 15sec  Goal status: INITIAL  LONG TERM GOALS: Target date: 07/18/23  Pt to improve FOTO score > 50 to indicated reduced difficulty with performance of mobility for household tasks.  Baseline: eval: 31 Goal status: INITIAL  2.  Pt to demonstrate AMB tolerance >1024ft @ >0.70m/s to improve ability to access community as needed for leisure and social participation.  Baseline:  Goal status: INITIAL  3.  Pt to reports performance of and consistency with regular HEP to help with long-term pain management maintenance and avoidance of deconditioning in strength.  Baseline:  Goal status: INITIAL  4.  Pt to demonstrate self selected gait speed over 10MWT>1.10m/s  to indicated improved AMB toelerance in household.  Baseline: 0.28m/s Goal status: INITIAL PLAN:  PT FREQUENCY: 2x/week  PT DURATION: 6 weeks  PLANNED INTERVENTIONS: 97110-Therapeutic exercises, 97530- Therapeutic activity, [id number]- Neuromuscular re-education, 97535- Self Care, 16109- Manual therapy, [phone]- Gait training, [phone]- Orthotic Fit/training, 97014- Electrical stimulation (unattended), [phone]- Electrical stimulation (manual), Patient/Family education, Balance training, Joint mobilization, Spinal manipulation, Spinal mobilization, and Moist heat.  PLAN FOR NEXT SESSION: FU on HEP performance, any remaining examination measures as seen fit; core strengthening, pain control   Becky Sax, LPTA/CLT; CBIS [phone]  Juel Burrow, PTA 06/11/2023, 10:52 AM

## 2023-06-16 ENCOUNTER — Ambulatory Visit (HOSPITAL_COMMUNITY): Payer: Medicare HMO | Attending: Physical Medicine and Rehabilitation

## 2023-06-16 ENCOUNTER — Encounter (HOSPITAL_COMMUNITY): Payer: Self-pay

## 2023-06-16 DIAGNOSIS — R262 Difficulty in walking, not elsewhere classified: Secondary | ICD-10-CM | POA: Diagnosis not present

## 2023-06-16 DIAGNOSIS — M5442 Lumbago with sciatica, left side: Secondary | ICD-10-CM | POA: Diagnosis not present

## 2023-06-16 DIAGNOSIS — G8929 Other chronic pain: Secondary | ICD-10-CM | POA: Insufficient documentation

## 2023-06-16 DIAGNOSIS — M5441 Lumbago with sciatica, right side: Secondary | ICD-10-CM | POA: Insufficient documentation

## 2023-06-16 NOTE — Therapy (Signed)
OUTPATIENT PHYSICAL THERAPY TREATMENT   Patient Name: [name] MRN: [id number] DOB:10/23/1960, 62 y.o., female Today's Date: 06/16/2023  END OF SESSION:  PT End of Session - 06/16/23 0806     Visit Number 4    Number of Visits 12    Date for PT Re-Evaluation 07/18/23    Authorization Type [employer] HMO/PPO; Medicaid of [place] [employer]    Authorization Time Period 06/06/23-07/18/23    Progress Note Due on Visit 8    PT Start Time 0845    PT Stop Time 0928    PT Time Calculation (min) 43 min    Activity Tolerance Patient limited by pain;Patient tolerated treatment well    Behavior During Therapy [facility] for tasks assessed/performed                Past Medical History:  Diagnosis Date   Anemia    Anxiety    Arthritis    back, hands , knees    Chest pain 2010   due to sarcoidosis- per pt.    Chronic pain    Depression    GERD (gastroesophageal reflux disease)    Hypercholesterolemia    Hypertension    Pneumonia    sarcoidosis- in remission    Pulmonary sarcoidosis (HCC)    Treatment with Prednisone   Past Surgical History:  Procedure Laterality Date   ABDOMINAL HYSTERECTOMY     APPENDECTOMY     CHOLECYSTECTOMY     EXCISION MASS ABDOMINAL N/A 03/20/2020   Procedure: Excision of anterior abdominal wall fat necrosis;  Surgeon: Allena Napoleon, MD;  Location: [facility];  Service: Plastics;  Laterality: N/A;   HERNIA REPAIR     INCISIONAL HERNIA REPAIR N/A 07/01/2019   Procedure: OPEN INCISIONAL HERNIA REPAIR WITH MESH;  Surgeon: Kinsinger, De Blanch, MD;  Location: [facility] OR;  Service: General;  Laterality: N/A;   INCISIONAL HERNIA REPAIR N/A 02/20/2021   Procedure: LAPAROSCOPIC RECURRENT INCISIONAL HERNIA REPAIR WITH MESH;  Surgeon: Sheliah Hatch De Blanch, MD;  Location: [facility];  Service: General;  Laterality: N/A;   INSERTION OF MESH N/A 07/01/2019   Procedure: Insertion Of Mesh;  Surgeon: Kinsinger, De Blanch, MD;  Location: [facility] OR;  Service:  General;  Laterality: N/A;   LAPAROSCOPIC GASTRIC SLEEVE RESECTION N/A 02/15/2019   Procedure: LAPAROSCOPIC GASTRIC SLEEVE RESECTION, UPPER ENDO, ERAS Pathway, Hiatal Hernia Repair;  Surgeon: Kinsinger, De Blanch, MD;  Location: [facility];  Service: General;  Laterality: N/A;   LAPAROSCOPY N/A 02/17/2019   Procedure: LAPAROSCOPY DIAGNOSTIC CONVERTED TO OPEN, EVACUATION OF HEMATOMA AND LIGATION OF VESSEL;  Surgeon: Kinsinger, De Blanch, MD;  Location: [facility];  Service: General;  Laterality: N/A;   LAPAROSCOPY N/A 07/01/2019   Procedure: Laparoscopy Diagnostic;  Surgeon: Sheliah Hatch De Blanch, MD;  Location: [facility] OR;  Service: General;  Laterality: N/A;   SMALL INTESTINE SURGERY     Patient Active Problem List   Diagnosis Date Noted   Dizziness 05/26/2023   Chronic pain 01/01/2023   Epigastric pain 11/14/2022   Need for immunization against influenza 05/29/2022   History of gastric bypass 02/06/2022   Current every day smoker 09/12/2021   Hypertension 07/12/2021   Vitamin D deficiency 07/12/2021   Aortic atherosclerosis (HCC) 07/12/2021   Incisional hernia 07/01/2019   Postoperative nausea and vomiting 02/24/2019   Back pain 11/19/2016   GASTROESOPHAGEAL REFLUX DISEASE 06/26/2009   SARCOIDOSIS, PULMONARY 06/23/2009    PCP: Gilmore Laroche, FNP  REFERRING PROVIDER: Ellwood Sayers, MD (pain management)  REFERRING DIAG: Low back pain   Rationale for Evaluation and Treatment: Rehabilitation   THERAPY DIAG:  Chronic midline low back pain with bilateral sciatica  Difficulty in walking, not elsewhere classified  ONSET DATE: Acute on chronic >5 years  SUBJECTIVE:                                                                                                                                                                                           SUBJECTIVE STATEMENT:   Patient reports pain 8/10 in low back. Had a great weekend with her grandson while he was playing football.      PERTINENT HISTORY:  [name] is a 62yoF who is referred by pain management to OPPT with chronic low back pain. Pt reports chronic pain in bilat hips, knees, ankles, is unable to say if any of these are related to her back pain. Pt previously seen by our services in 2018 with similar issue. Pt reports she is disabled, at home most days, struggles through her housework tasks (making the bed) despite severe pain, has found ways to adapt many tasks to a sitting set up (cleaning the bathroom, washing dishes). PMH: sarcoidosis with related pain in many areas.  Current pain: 9/10 Worst pain: 9.5/10 Aggravating factors: bending forward, vacuuming, sitting too long. Alleviating factors: very hot showers, daily use of icy hot; takes hydrocodone PRN.   PAIN:  Are you having pain? 8/10 (just above the gluteal cleft)   PRECAUTIONS: None  WEIGHT BEARING RESTRICTIONS: None   FALLS:  Has patient fallen in last 6 months? No  LIVING ENVIRONMENT: Lives with: husband  Stairs: lives on main level, no need to access 2nd floor on regular, no entry stairs  OCCUPATION: on disability  PLOF: modified independent  PATIENT GOALS: improved pain control, improved ability to manage homecare tasks   NEXT MD VISIT: unknown  OBJECTIVE:  Note: Objective measures were completed at Evaluation unless otherwise noted.  DIAGNOSTIC FINDINGS:  Left Lumbar MRI 2018:  "IMPRESSION: Progressive degenerative disease at L3-4 where there are new bilateral facet joint effusions and a shallow disc bulge. The central canal remains open but mild to moderate bilateral foraminal narrowing has slightly worsened since the prior MRI.   Progressive spondylosis at L4-5 since the prior MRI where there is a new shallow left paracentral protrusion superimposed on a small disc bulge. Left greater than right subarticular recess narrowing is present which could irritate either descending L5 root."  PATIENT SURVEYS:  FOTO:  31  COGNITION: Overall cognitive status: Within functional limits for tasks assessed     SENSATION: Pt reports intact  PALPATION: Deferred to  later visit PRN  LUMBAR PHYSIOLOGICAL MOVEMENT:   AROM Eval (06/06/23)  Flexion (seated forward flexion) Slight maintained lordosis)  Extension deferred  Right lateral flexion deferred  Left lateral flexion deferred  Right rotation deferred  Left rotation deferred     FUNCTIONAL TESTS:  -5xSTS: 15sec hands free  - : 0.84m/s, fairly antalgic, increases pain   TODAY'S TREATMENT:                                                                                                                              DATE:   06/16/23:  Manual: Prone STM to lumbar paraspinals and B piriformis x 10 minutes.   TE:  MH applied to low back during exercises  Supine lower trunk rotation x 15 each side Supine marching 2 x 10 each LE, with GTB around knees x 10  Supine bridge x 10 each LE  Supine clamshell with RTB around knees x 10 each LE, upgraded to GTB x 10  Seated forward trunk stretch with physioball x 10 fwd/x10 anterolateral each side   06/11/23: Educated on Estim for pain control:  IFC x 10 min with intesity at 31 with MHP x during therex Supine: LTR SKTC 3x 20" DKTC 2x 20" Transverse abdominis x 2 min paired with exhale Posterior pelvic tilt x 1 min Marching with ab set 10x 5" Glut set  Sidelying clam with RTB  06/09/23: Reviewed goals  Reviewed HEP compliance Educated log rolling Supine: LTR Marching with cueing for ab stability SKTC 3x 20" Piriformis 90/90 with towel assistance Diaphragmatic breathing x 2 min Transverse abdominis x 2 min paired with exhale Isometric add/abd alternating squeeze ball then abd into belt 20x 5" Glut sets 10x5" Clam 10x   PATIENT EDUCATION:  Education details: importance of self pacing for home tasks, modification of others;   -HEP activities Person educated: patient Education  method: explanation Education comprehension: pt confirms  HOME EXERCISE PROGRAM: Access Code: [email or web address] URL: [email or web address] Date: 06/08/2023 Prepared by: Alvera Novel  Exercises - Supine Lower Trunk Rotation  - 1 x daily - 7 x weekly - 3 sets - 10 reps - Supine Single Knee to Chest Stretch  - 1 x daily - 7 x weekly - 3 sets - 10 reps - Supine March  - 1 x daily - 7 x weekly - 3 sets - 10 reps  06/09/23: - Supine Diaphragmatic Breathing  - 2-3 x daily - 7 x weekly - 3 sets - 10 reps - 5" hold - Seated Transversus Abdominis Bracing  - 1 x daily - 7 x weekly - 3 sets - 10 reps - Supine Transversus Abdominis Bracing - Hands on Stomach  - 1 x daily - 7 x weekly - 3 sets - 10 reps - Hooklying Gluteal Sets  - 1 x daily - 7 x weekly - 3 sets - 10 reps - Hooklying Isometric Hip Abduction Adduction with Belt and Ball  - 1 x daily -  7 x weekly - 3 sets - 10 reps  ASSESSMENT:  CLINICAL IMPRESSION:   Patient arrives to treatment session with 8/10 low back pain but motivated to participate. Session focused on manual STM, core strengthening, and low back stretching. Tolerated treatment session well despite maintaining 8/10 pain at end of session. Patient will continue to benefit from skilled therapy to address remaining deficits in order to improve quality of life and return to PLOF.    OBJECTIVE IMPAIRMENTS: Abnormal gait, cardiopulmonary status limiting activity, decreased activity tolerance, decreased balance, decreased cognition, decreased knowledge of condition, decreased knowledge of use of DME, decreased mobility, difficulty walking, decreased ROM, and decreased strength  ACTIVITY LIMITATIONS: carrying, lifting, bending, sitting, standing, squatting, transfers, and bed mobility  PARTICIPATION LIMITATIONS: meal prep, cleaning, laundry, personal finances, interpersonal relationship, driving, shopping, community activity, occupation, and yard work  PERSONAL FACTORS: Age,  Behavior pattern, Fitness, Past/current experiences, Profession, Sex, Social background, Time since onset of injury/illness/exacerbation, and Transportation are also affecting patient's functional outcome.   REHAB POTENTIAL: Good  CLINICAL DECISION MAKING: Unstable/unpredictable  EVALUATION COMPLEXITY: Moderate   GOALS: Goals reviewed with patient? No  SHORT TERM GOALS: Target date: 07/20/23  Pt to report tolerance for AMB 2-3x daily up to 5 minutes to improve general wellness/fitness.  Baseline: only walks to door to car (farthest distance) as needed Goal status: INITIAL  2.  Pt to demonstrate 575ft continuous AMB >0.26m/s to indicate ability to perform limited community distances at appropriate speeds for energy conservation.  Baseline: not going out ad lib unless necessary  Goal status: INITIAL  3.  Pt to demonstrate 5xSTS hands free <12sec without pain exacerbation.  Baseline: 15sec  Goal status: INITIAL  LONG TERM GOALS: Target date: 07/18/23  Pt to improve FOTO score > 50 to indicated reduced difficulty with performance of mobility for household tasks.  Baseline: eval: 31 Goal status: INITIAL  2.  Pt to demonstrate AMB tolerance >1028ft @ >0.1m/s to improve ability to access community as needed for leisure and social participation.  Baseline:  Goal status: INITIAL  3.  Pt to reports performance of and consistency with regular HEP to help with long-term pain management maintenance and avoidance of deconditioning in strength.  Baseline:  Goal status: INITIAL  4.  Pt to demonstrate self selected gait speed over 10MWT>1.16m/s to indicated improved AMB toelerance in household.  Baseline: 0.26m/s Goal status: INITIAL PLAN:  PT FREQUENCY: 2x/week  PT DURATION: 6 weeks  PLANNED INTERVENTIONS: 97110-Therapeutic exercises, 97530- Therapeutic activity, [id number]- Neuromuscular re-education, 97535- Self Care, 95284- Manual therapy, [phone]- Gait training, [phone]- Orthotic  Fit/training, 97014- Electrical stimulation (unattended), [phone]- Electrical stimulation (manual), Patient/Family education, Balance training, Joint mobilization, Spinal manipulation, Spinal mobilization, and Moist heat.  PLAN FOR NEXT SESSION: FU on HEP performance, any remaining examination measures as seen fit; core strengthening, pain control    Viviann Spare, PT, DPT 06/16/2023, 8:06 AM

## 2023-06-17 NOTE — Therapy (Signed)
OUTPATIENT PHYSICAL THERAPY TREATMENT   Patient Name: [name] MRN: [id number] DOB:11-07-1960, 62 y.o., female Today's Date: 06/18/2023  END OF SESSION:  PT End of Session - 06/18/23 0749     Visit Number 5    Number of Visits 12    Date for PT Re-Evaluation 07/18/23    Authorization Type [employer] HMO/PPO; Medicaid of [place]    Authorization Time Period 06/06/23-07/18/23    Progress Note Due on Visit 10    PT Start Time 0800    PT Stop Time [phone]    PT Time Calculation (min) 42 min    Activity Tolerance Patient limited by pain;Patient tolerated treatment well    Behavior During Therapy [facility] for tasks assessed/performed                 Past Medical History:  Diagnosis Date   Anemia    Anxiety    Arthritis    back, hands , knees    Chest pain 2010   due to sarcoidosis- per pt.    Chronic pain    Depression    GERD (gastroesophageal reflux disease)    Hypercholesterolemia    Hypertension    Pneumonia    sarcoidosis- in remission    Pulmonary sarcoidosis (HCC)    Treatment with Prednisone   Past Surgical History:  Procedure Laterality Date   ABDOMINAL HYSTERECTOMY     APPENDECTOMY     CHOLECYSTECTOMY     EXCISION MASS ABDOMINAL N/A 03/20/2020   Procedure: Excision of anterior abdominal wall fat necrosis;  Surgeon: Allena Napoleon, MD;  Location: [facility];  Service: Plastics;  Laterality: N/A;   HERNIA REPAIR     INCISIONAL HERNIA REPAIR N/A 07/01/2019   Procedure: OPEN INCISIONAL HERNIA REPAIR WITH MESH;  Surgeon: Kinsinger, De Blanch, MD;  Location: [facility] OR;  Service: General;  Laterality: N/A;   INCISIONAL HERNIA REPAIR N/A 02/20/2021   Procedure: LAPAROSCOPIC RECURRENT INCISIONAL HERNIA REPAIR WITH MESH;  Surgeon: Sheliah Hatch De Blanch, MD;  Location: [facility];  Service: General;  Laterality: N/A;   INSERTION OF MESH N/A 07/01/2019   Procedure: Insertion Of Mesh;  Surgeon: Kinsinger, De Blanch, MD;  Location: [facility] OR;  Service:  General;  Laterality: N/A;   LAPAROSCOPIC GASTRIC SLEEVE RESECTION N/A 02/15/2019   Procedure: LAPAROSCOPIC GASTRIC SLEEVE RESECTION, UPPER ENDO, ERAS Pathway, Hiatal Hernia Repair;  Surgeon: Kinsinger, De Blanch, MD;  Location: [facility];  Service: General;  Laterality: N/A;   LAPAROSCOPY N/A 02/17/2019   Procedure: LAPAROSCOPY DIAGNOSTIC CONVERTED TO OPEN, EVACUATION OF HEMATOMA AND LIGATION OF VESSEL;  Surgeon: Kinsinger, De Blanch, MD;  Location: [facility];  Service: General;  Laterality: N/A;   LAPAROSCOPY N/A 07/01/2019   Procedure: Laparoscopy Diagnostic;  Surgeon: Sheliah Hatch De Blanch, MD;  Location: [facility] OR;  Service: General;  Laterality: N/A;   SMALL INTESTINE SURGERY     Patient Active Problem List   Diagnosis Date Noted   Dizziness 05/26/2023   Chronic pain 01/01/2023   Epigastric pain 11/14/2022   Need for immunization against influenza 05/29/2022   History of gastric bypass 02/06/2022   Current every day smoker 09/12/2021   Hypertension 07/12/2021   Vitamin D deficiency 07/12/2021   Aortic atherosclerosis (HCC) 07/12/2021   Incisional hernia 07/01/2019   Postoperative nausea and vomiting 02/24/2019   Back pain 11/19/2016   GASTROESOPHAGEAL REFLUX DISEASE 06/26/2009   SARCOIDOSIS, PULMONARY 06/23/2009    PCP: Gilmore Laroche, FNP  REFERRING PROVIDER: Ellwood Sayers, MD (pain management)  REFERRING DIAG: Low back pain   Rationale for Evaluation and Treatment: Rehabilitation   THERAPY DIAG:  Chronic midline low back pain with bilateral sciatica  Difficulty in walking, not elsewhere classified  ONSET DATE: Acute on chronic >5 years  SUBJECTIVE:                                                                                                                                                                                           SUBJECTIVE STATEMENT:   Patient reports 9/10 pain in low back this AM.    PERTINENT HISTORY:  [name] is a 61yoF who is referred by  pain management to OPPT with chronic low back pain. Pt reports chronic pain in bilat hips, knees, ankles, is unable to say if any of these are related to her back pain. Pt previously seen by our services in 2018 with similar issue. Pt reports she is disabled, at home most days, struggles through her housework tasks (making the bed) despite severe pain, has found ways to adapt many tasks to a sitting set up (cleaning the bathroom, washing dishes). PMH: sarcoidosis with related pain in many areas.  Current pain: 9/10 Worst pain: 9.5/10 Aggravating factors: bending forward, vacuuming, sitting too long. Alleviating factors: very hot showers, daily use of icy hot; takes hydrocodone PRN.   PAIN:  Are you having pain? 8/10 (just above the gluteal cleft)   PRECAUTIONS: None  WEIGHT BEARING RESTRICTIONS: None   FALLS:  Has patient fallen in last 6 months? No  LIVING ENVIRONMENT: Lives with: husband  Stairs: lives on main level, no need to access 2nd floor on regular, no entry stairs  OCCUPATION: on disability  PLOF: modified independent  PATIENT GOALS: improved pain control, improved ability to manage homecare tasks   NEXT MD VISIT: unknown  OBJECTIVE:  Note: Objective measures were completed at Evaluation unless otherwise noted.  DIAGNOSTIC FINDINGS:  Left Lumbar MRI 2018:  "IMPRESSION: Progressive degenerative disease at L3-4 where there are new bilateral facet joint effusions and a shallow disc bulge. The central canal remains open but mild to moderate bilateral foraminal narrowing has slightly worsened since the prior MRI.   Progressive spondylosis at L4-5 since the prior MRI where there is a new shallow left paracentral protrusion superimposed on a small disc bulge. Left greater than right subarticular recess narrowing is present which could irritate either descending L5 root."  PATIENT SURVEYS:  FOTO: 31  COGNITION: Overall cognitive status: Within functional limits for  tasks assessed     SENSATION: Pt reports intact  PALPATION: Deferred to later visit PRN  LUMBAR PHYSIOLOGICAL MOVEMENT:   AROM Eval (  06/06/23)  Flexion (seated forward flexion) Slight maintained lordosis)  Extension deferred  Right lateral flexion deferred  Left lateral flexion deferred  Right rotation deferred  Left rotation deferred     FUNCTIONAL TESTS:  -5xSTS: 15sec hands free  - : 0.66m/s, fairly antalgic, increases pain   TODAY'S TREATMENT:                                                                                                                              DATE:    06/18/23: Lying prone x 3 minutes  Prone press up x 10 - no reduction in pain  Seated lumbar extension x 10  Seated forward trunk flexion stretch x 10 with 5 second hold  Seated paloff press with RTB 2 x 10 each side Seated rows with RTB 2 x 10  Seated shoulder extension with RTB 2 x 10  06/16/23:  Manual: Prone STM to lumbar paraspinals and B piriformis x 10 minutes.   TE:  MH applied to low back during exercises  Supine lower trunk rotation x 15 each side Supine marching 2 x 10 each LE, with GTB around knees x 10  Supine bridge x 10 each LE  Supine clamshell with RTB around knees x 10 each LE, upgraded to GTB x 10  Seated forward trunk stretch with physioball x 10 fwd/x10 anterolateral each side   06/11/23: Educated on Estim for pain control:  IFC x 10 min with intesity at 31 with MHP x during therex Supine: LTR SKTC 3x 20" DKTC 2x 20" Transverse abdominis x 2 min paired with exhale Posterior pelvic tilt x 1 min Marching with ab set 10x 5" Glut set  Sidelying clam with RTB  06/09/23: Reviewed goals  Reviewed HEP compliance Educated log rolling Supine: LTR Marching with cueing for ab stability SKTC 3x 20" Piriformis 90/90 with towel assistance Diaphragmatic breathing x 2 min Transverse abdominis x 2 min paired with exhale Isometric add/abd alternating squeeze  ball then abd into belt 20x 5" Glut sets 10x5" Clam 10x   PATIENT EDUCATION:  Education details: importance of self pacing for home tasks, modification of others;   -HEP activities Person educated: patient Education method: explanation Education comprehension: pt confirms  HOME EXERCISE PROGRAM: Access Code: [email or web address] URL: [email or web address] Date: 06/08/2023 Prepared by: Alvera Novel  Exercises - Supine Lower Trunk Rotation  - 1 x daily - 7 x weekly - 3 sets - 10 reps - Supine Single Knee to Chest Stretch  - 1 x daily - 7 x weekly - 3 sets - 10 reps - Supine March  - 1 x daily - 7 x weekly - 3 sets - 10 reps  06/09/23: - Supine Diaphragmatic Breathing  - 2-3 x daily - 7 x weekly - 3 sets - 10 reps - 5" hold - Seated Transversus Abdominis Bracing  - 1 x daily - 7 x weekly - 3 sets - 10 reps - Supine Transversus Abdominis Bracing -  Hands on Stomach  - 1 x daily - 7 x weekly - 3 sets - 10 reps - Hooklying Gluteal Sets  - 1 x daily - 7 x weekly - 3 sets - 10 reps - Hooklying Isometric Hip Abduction Adduction with Belt and Ball  - 1 x daily - 7 x weekly - 3 sets - 10 reps  ASSESSMENT:  CLINICAL IMPRESSION:    Patient arrives to treatment session with continued severe pain in low back and rating at 9/10 this date. Continues to be limited by pain dominant presentation. Attempted directional preference assessment but no improvement in pain with extension or flexion. Session focused on core strengthening with continued back pain. Patient will continue to benefit from skilled PT interventions to address remaining deficits to improve QOL and reduction in pain.     OBJECTIVE IMPAIRMENTS: Abnormal gait, cardiopulmonary status limiting activity, decreased activity tolerance, decreased balance, decreased cognition, decreased knowledge of condition, decreased knowledge of use of DME, decreased mobility, difficulty walking, decreased ROM, and decreased strength  ACTIVITY  LIMITATIONS: carrying, lifting, bending, sitting, standing, squatting, transfers, and bed mobility  PARTICIPATION LIMITATIONS: meal prep, cleaning, laundry, personal finances, interpersonal relationship, driving, shopping, community activity, occupation, and yard work  PERSONAL FACTORS: Age, Behavior pattern, Fitness, Past/current experiences, Profession, Sex, Social background, Time since onset of injury/illness/exacerbation, and Transportation are also affecting patient's functional outcome.   REHAB POTENTIAL: Good  CLINICAL DECISION MAKING: Unstable/unpredictable  EVALUATION COMPLEXITY: Moderate   GOALS: Goals reviewed with patient? No  SHORT TERM GOALS: Target date: 07/20/23  Pt to report tolerance for AMB 2-3x daily up to 5 minutes to improve general wellness/fitness.  Baseline: only walks to door to car (farthest distance) as needed Goal status: INITIAL  2.  Pt to demonstrate 552ft continuous AMB >0.55m/s to indicate ability to perform limited community distances at appropriate speeds for energy conservation.  Baseline: not going out ad lib unless necessary  Goal status: INITIAL  3.  Pt to demonstrate 5xSTS hands free <12sec without pain exacerbation.  Baseline: 15sec  Goal status: INITIAL  LONG TERM GOALS: Target date: 07/18/23  Pt to improve FOTO score > 50 to indicated reduced difficulty with performance of mobility for household tasks.  Baseline: eval: 31 Goal status: INITIAL  2.  Pt to demonstrate AMB tolerance >1039ft @ >0.67m/s to improve ability to access community as needed for leisure and social participation.  Baseline:  Goal status: INITIAL  3.  Pt to reports performance of and consistency with regular HEP to help with long-term pain management maintenance and avoidance of deconditioning in strength.  Baseline:  Goal status: INITIAL  4.  Pt to demonstrate self selected gait speed over 10MWT>1.45m/s to indicated improved AMB toelerance in household.   Baseline: 0.16m/s Goal status: INITIAL PLAN:  PT FREQUENCY: 2x/week  PT DURATION: 6 weeks  PLANNED INTERVENTIONS: 97110-Therapeutic exercises, 97530- Therapeutic activity, [id number]- Neuromuscular re-education, 97535- Self Care, 40981- Manual therapy, [phone]- Gait training, [phone]- Orthotic Fit/training, 97014- Electrical stimulation (unattended), [phone]- Electrical stimulation (manual), Patient/Family education, Balance training, Joint mobilization, Spinal manipulation, Spinal mobilization, and Moist heat.  PLAN FOR NEXT SESSION: FU on HEP performance, any remaining examination measures as seen fit; core strengthening, pain control    Viviann Spare, PT, DPT 06/18/2023, 7:49 AM

## 2023-06-18 ENCOUNTER — Ambulatory Visit (HOSPITAL_COMMUNITY): Payer: Medicare HMO

## 2023-06-18 ENCOUNTER — Encounter (HOSPITAL_COMMUNITY): Payer: Self-pay

## 2023-06-18 DIAGNOSIS — R262 Difficulty in walking, not elsewhere classified: Secondary | ICD-10-CM | POA: Diagnosis not present

## 2023-06-18 DIAGNOSIS — M5441 Lumbago with sciatica, right side: Secondary | ICD-10-CM | POA: Diagnosis not present

## 2023-06-18 DIAGNOSIS — M5442 Lumbago with sciatica, left side: Secondary | ICD-10-CM | POA: Diagnosis not present

## 2023-06-18 DIAGNOSIS — G8929 Other chronic pain: Secondary | ICD-10-CM | POA: Diagnosis not present

## 2023-06-20 NOTE — Therapy (Signed)
OUTPATIENT PHYSICAL THERAPY TREATMENT   Patient Name: [name] MRN: [id number] DOB:January 30, 1961, 62 y.o., female Today's Date: 06/20/2023  END OF SESSION:        Past Medical History:  Diagnosis Date   Anemia    Anxiety    Arthritis    back, hands , knees    Chest pain 2010   due to sarcoidosis- per pt.    Chronic pain    Depression    GERD (gastroesophageal reflux disease)    Hypercholesterolemia    Hypertension    Pneumonia    sarcoidosis- in remission    Pulmonary sarcoidosis (HCC)    Treatment with Prednisone   Past Surgical History:  Procedure Laterality Date   ABDOMINAL HYSTERECTOMY     APPENDECTOMY     CHOLECYSTECTOMY     EXCISION MASS ABDOMINAL N/A 03/20/2020   Procedure: Excision of anterior abdominal wall fat necrosis;  Surgeon: Allena Napoleon, MD;  Location: [facility];  Service: Plastics;  Laterality: N/A;   HERNIA REPAIR     INCISIONAL HERNIA REPAIR N/A 07/01/2019   Procedure: OPEN INCISIONAL HERNIA REPAIR WITH MESH;  Surgeon: Kinsinger, De Blanch, MD;  Location: [facility] OR;  Service: General;  Laterality: N/A;   INCISIONAL HERNIA REPAIR N/A 02/20/2021   Procedure: LAPAROSCOPIC RECURRENT INCISIONAL HERNIA REPAIR WITH MESH;  Surgeon: Sheliah Hatch De Blanch, MD;  Location: [facility];  Service: General;  Laterality: N/A;   INSERTION OF MESH N/A 07/01/2019   Procedure: Insertion Of Mesh;  Surgeon: Kinsinger, De Blanch, MD;  Location: [facility] OR;  Service: General;  Laterality: N/A;   LAPAROSCOPIC GASTRIC SLEEVE RESECTION N/A 02/15/2019   Procedure: LAPAROSCOPIC GASTRIC SLEEVE RESECTION, UPPER ENDO, ERAS Pathway, Hiatal Hernia Repair;  Surgeon: Kinsinger, De Blanch, MD;  Location: [facility];  Service: General;  Laterality: N/A;   LAPAROSCOPY N/A 02/17/2019   Procedure: LAPAROSCOPY DIAGNOSTIC CONVERTED TO OPEN, EVACUATION OF HEMATOMA AND LIGATION OF VESSEL;  Surgeon: Kinsinger, De Blanch, MD;  Location: [facility];  Service: General;  Laterality: N/A;    LAPAROSCOPY N/A 07/01/2019   Procedure: Laparoscopy Diagnostic;  Surgeon: Sheliah Hatch De Blanch, MD;  Location: [facility] OR;  Service: General;  Laterality: N/A;   SMALL INTESTINE SURGERY     Patient Active Problem List   Diagnosis Date Noted   Dizziness 05/26/2023   Chronic pain 01/01/2023   Epigastric pain 11/14/2022   Need for immunization against influenza 05/29/2022   History of gastric bypass 02/06/2022   Current every day smoker 09/12/2021   Hypertension 07/12/2021   Vitamin D deficiency 07/12/2021   Aortic atherosclerosis (HCC) 07/12/2021   Incisional hernia 07/01/2019   Postoperative nausea and vomiting 02/24/2019   Back pain 11/19/2016   GASTROESOPHAGEAL REFLUX DISEASE 06/26/2009   SARCOIDOSIS, PULMONARY 06/23/2009    PCP: Gilmore Laroche, FNP  REFERRING PROVIDER: Ellwood Sayers, MD (pain management)   REFERRING DIAG: Low back pain   Rationale for Evaluation and Treatment: Rehabilitation   THERAPY DIAG:  Chronic midline low back pain with bilateral sciatica  Difficulty in walking, not elsewhere classified  ONSET DATE: Acute on chronic >5 years  SUBJECTIVE:  SUBJECTIVE STATEMENT:  ***  Patient reports 9/10 pain in low back this AM.    PERTINENT HISTORY:  [name] is a 61yoF who is referred by pain management to OPPT with chronic low back pain. Pt reports chronic pain in bilat hips, knees, ankles, is unable to say if any of these are related to her back pain. Pt previously seen by our services in 2018 with similar issue. Pt reports she is disabled, at home most days, struggles through her housework tasks (making the bed) despite severe pain, has found ways to adapt many tasks to a sitting set up (cleaning the bathroom, washing dishes). PMH: sarcoidosis with related pain in many  areas.  Current pain: 9/10 Worst pain: 9.5/10 Aggravating factors: bending forward, vacuuming, sitting too long. Alleviating factors: very hot showers, daily use of icy hot; takes hydrocodone PRN.   PAIN:  Are you having pain? 8/10 (just above the gluteal cleft)   PRECAUTIONS: None  WEIGHT BEARING RESTRICTIONS: None   FALLS:  Has patient fallen in last 6 months? No  LIVING ENVIRONMENT: Lives with: husband  Stairs: lives on main level, no need to access 2nd floor on regular, no entry stairs  OCCUPATION: on disability  PLOF: modified independent  PATIENT GOALS: improved pain control, improved ability to manage homecare tasks   NEXT MD VISIT: unknown  OBJECTIVE:  Note: Objective measures were completed at Evaluation unless otherwise noted.  DIAGNOSTIC FINDINGS:  Left Lumbar MRI 2018:  "IMPRESSION: Progressive degenerative disease at L3-4 where there are new bilateral facet joint effusions and a shallow disc bulge. The central canal remains open but mild to moderate bilateral foraminal narrowing has slightly worsened since the prior MRI.   Progressive spondylosis at L4-5 since the prior MRI where there is a new shallow left paracentral protrusion superimposed on a small disc bulge. Left greater than right subarticular recess narrowing is present which could irritate either descending L5 root."  PATIENT SURVEYS:  FOTO: 31  COGNITION: Overall cognitive status: Within functional limits for tasks assessed     SENSATION: Pt reports intact  PALPATION: Deferred to later visit PRN  LUMBAR PHYSIOLOGICAL MOVEMENT:   AROM Eval (06/06/23)  Flexion (seated forward flexion) Slight maintained lordosis)  Extension deferred  Right lateral flexion deferred  Left lateral flexion deferred  Right rotation deferred  Left rotation deferred     FUNCTIONAL TESTS:  -5xSTS: 15sec hands free  - : 0.75m/s, fairly antalgic, increases pain   TODAY'S TREATMENT:                                                                                                                               DATE:  06/23/23: ***   06/18/23: Lying prone x 3 minutes  Prone press up x 10 - no reduction in pain  Seated lumbar extension x 10  Seated forward trunk flexion stretch x 10 with 5 second hold  Seated paloff press with RTB 2 x 10 each  side Seated rows with RTB 2 x 10  Seated shoulder extension with RTB 2 x 10  06/16/23:  Manual: Prone STM to lumbar paraspinals and B piriformis x 10 minutes.   TE:  MH applied to low back during exercises  Supine lower trunk rotation x 15 each side Supine marching 2 x 10 each LE, with GTB around knees x 10  Supine bridge x 10 each LE  Supine clamshell with RTB around knees x 10 each LE, upgraded to GTB x 10  Seated forward trunk stretch with physioball x 10 fwd/x10 anterolateral each side   06/11/23: Educated on Estim for pain control:  IFC x 10 min with intesity at 31 with MHP x during therex Supine: LTR SKTC 3x 20" DKTC 2x 20" Transverse abdominis x 2 min paired with exhale Posterior pelvic tilt x 1 min Marching with ab set 10x 5" Glut set  Sidelying clam with RTB  06/09/23: Reviewed goals  Reviewed HEP compliance Educated log rolling Supine: LTR Marching with cueing for ab stability SKTC 3x 20" Piriformis 90/90 with towel assistance Diaphragmatic breathing x 2 min Transverse abdominis x 2 min paired with exhale Isometric add/abd alternating squeeze ball then abd into belt 20x 5" Glut sets 10x5" Clam 10x   PATIENT EDUCATION:  Education details: importance of self pacing for home tasks, modification of others;   -HEP activities Person educated: patient Education method: explanation Education comprehension: pt confirms  HOME EXERCISE PROGRAM: Access Code: [email or web address] URL: [email or web address] Date: 06/08/2023 Prepared by: Alvera Novel  Exercises - Supine Lower Trunk Rotation  - 1 x daily - 7  x weekly - 3 sets - 10 reps - Supine Single Knee to Chest Stretch  - 1 x daily - 7 x weekly - 3 sets - 10 reps - Supine March  - 1 x daily - 7 x weekly - 3 sets - 10 reps  06/09/23: - Supine Diaphragmatic Breathing  - 2-3 x daily - 7 x weekly - 3 sets - 10 reps - 5" hold - Seated Transversus Abdominis Bracing  - 1 x daily - 7 x weekly - 3 sets - 10 reps - Supine Transversus Abdominis Bracing - Hands on Stomach  - 1 x daily - 7 x weekly - 3 sets - 10 reps - Hooklying Gluteal Sets  - 1 x daily - 7 x weekly - 3 sets - 10 reps - Hooklying Isometric Hip Abduction Adduction with Belt and Ball  - 1 x daily - 7 x weekly - 3 sets - 10 reps  ASSESSMENT:  CLINICAL IMPRESSION:   ***  Patient arrives to treatment session with continued severe pain in low back and rating at 9/10 this date. Continues to be limited by pain dominant presentation. Attempted directional preference assessment but no improvement in pain with extension or flexion. Session focused on core strengthening with continued back pain. Patient will continue to benefit from skilled PT interventions to address remaining deficits to improve QOL and reduction in pain.     OBJECTIVE IMPAIRMENTS: Abnormal gait, cardiopulmonary status limiting activity, decreased activity tolerance, decreased balance, decreased cognition, decreased knowledge of condition, decreased knowledge of use of DME, decreased mobility, difficulty walking, decreased ROM, and decreased strength  ACTIVITY LIMITATIONS: carrying, lifting, bending, sitting, standing, squatting, transfers, and bed mobility  PARTICIPATION LIMITATIONS: meal prep, cleaning, laundry, personal finances, interpersonal relationship, driving, shopping, community activity, occupation, and yard work  PERSONAL FACTORS: Age, Behavior pattern, Fitness, Past/current experiences, Profession, Sex, Social background, Time since onset  of injury/illness/exacerbation, and Transportation are also affecting patient's  functional outcome.   REHAB POTENTIAL: Good  CLINICAL DECISION MAKING: Unstable/unpredictable  EVALUATION COMPLEXITY: Moderate   GOALS: Goals reviewed with patient? No  SHORT TERM GOALS: Target date: 07/20/23  Pt to report tolerance for AMB 2-3x daily up to 5 minutes to improve general wellness/fitness.  Baseline: only walks to door to car (farthest distance) as needed Goal status: INITIAL  2.  Pt to demonstrate 561ft continuous AMB >0.62m/s to indicate ability to perform limited community distances at appropriate speeds for energy conservation.  Baseline: not going out ad lib unless necessary  Goal status: INITIAL  3.  Pt to demonstrate 5xSTS hands free <12sec without pain exacerbation.  Baseline: 15sec  Goal status: INITIAL  LONG TERM GOALS: Target date: 07/18/23  Pt to improve FOTO score > 50 to indicated reduced difficulty with performance of mobility for household tasks.  Baseline: eval: 31 Goal status: INITIAL  2.  Pt to demonstrate AMB tolerance >1021ft @ >0.11m/s to improve ability to access community as needed for leisure and social participation.  Baseline:  Goal status: INITIAL  3.  Pt to reports performance of and consistency with regular HEP to help with long-term pain management maintenance and avoidance of deconditioning in strength.  Baseline:  Goal status: INITIAL  4.  Pt to demonstrate self selected gait speed over 10MWT>1.84m/s to indicated improved AMB toelerance in household.  Baseline: 0.57m/s Goal status: INITIAL PLAN:  PT FREQUENCY: 2x/week  PT DURATION: 6 weeks  PLANNED INTERVENTIONS: 97110-Therapeutic exercises, 97530- Therapeutic activity, [id number]- Neuromuscular re-education, 97535- Self Care, 29562- Manual therapy, [phone]- Gait training, [phone]- Orthotic Fit/training, 97014- Electrical stimulation (unattended), [phone]- Electrical stimulation (manual), Patient/Family education, Balance training, Joint mobilization, Spinal manipulation, Spinal  mobilization, and Moist heat.  PLAN FOR NEXT SESSION: FU on HEP performance, any remaining examination measures as seen fit; core strengthening, pain control    Viviann Spare, PT, DPT 06/20/2023, 11:45 AM

## 2023-06-23 ENCOUNTER — Encounter (HOSPITAL_COMMUNITY): Payer: Self-pay

## 2023-06-23 ENCOUNTER — Ambulatory Visit (HOSPITAL_COMMUNITY): Payer: Medicare HMO

## 2023-06-23 DIAGNOSIS — M5441 Lumbago with sciatica, right side: Secondary | ICD-10-CM | POA: Diagnosis not present

## 2023-06-23 DIAGNOSIS — G8929 Other chronic pain: Secondary | ICD-10-CM

## 2023-06-23 DIAGNOSIS — M5442 Lumbago with sciatica, left side: Secondary | ICD-10-CM | POA: Diagnosis not present

## 2023-06-23 DIAGNOSIS — R262 Difficulty in walking, not elsewhere classified: Secondary | ICD-10-CM

## 2023-06-24 NOTE — Therapy (Signed)
OUTPATIENT PHYSICAL THERAPY TREATMENT   Patient Name: [name] MRN: [id number] DOB:[birth year], 62 y.o., female Today's Date: 06/25/2023  END OF SESSION:  PT End of Session - 06/25/23 0758     Visit Number 7    Number of Visits 12    Date for PT Re-Evaluation 07/18/23    Authorization Type [employer] Medicare HMO/PPO; Medicaid of [place] [employer]    Authorization Time Period 06/06/23-07/18/23    Progress Note Due on Visit 10    PT Start Time 0801    PT Stop Time [phone]    PT Time Calculation (min) 41 min    Activity Tolerance Patient limited by pain;Patient tolerated treatment well    Behavior During Therapy [facility] for tasks assessed/performed              Past Medical History:  Diagnosis Date   Anemia    Anxiety    Arthritis    back, hands , knees    Chest pain 2010   due to sarcoidosis- per pt.    Chronic pain    Depression    GERD (gastroesophageal reflux disease)    Hypercholesterolemia    Hypertension    Pneumonia    sarcoidosis- in remission    Pulmonary sarcoidosis (HCC)    Treatment with Prednisone   Past Surgical History:  Procedure Laterality Date   ABDOMINAL HYSTERECTOMY     APPENDECTOMY     CHOLECYSTECTOMY     EXCISION MASS ABDOMINAL N/A 03/20/2020   Procedure: Excision of anterior abdominal wall fat necrosis;  Surgeon: Allena Napoleon, MD;  Location: [facility];  Service: Plastics;  Laterality: N/A;   HERNIA REPAIR     INCISIONAL HERNIA REPAIR N/A 07/01/2019   Procedure: OPEN INCISIONAL HERNIA REPAIR WITH MESH;  Surgeon: Kinsinger, De Blanch, MD;  Location: [facility] OR;  Service: General;  Laterality: N/A;   INCISIONAL HERNIA REPAIR N/A 02/20/2021   Procedure: LAPAROSCOPIC RECURRENT INCISIONAL HERNIA REPAIR WITH MESH;  Surgeon: Sheliah Hatch De Blanch, MD;  Location: [facility];  Service: General;  Laterality: N/A;   INSERTION OF MESH N/A 07/01/2019   Procedure: Insertion Of Mesh;  Surgeon: Kinsinger, De Blanch, MD;  Location[facility] OR;  Service: General;   Laterality: N/A;   LAPAROSCOPIC GASTRIC SLEEVE RESECTION N/A 02/15/2019   Procedure: LAPAROSCOPIC GASTRIC SLEEVE RESECTION, UPPER ENDO, ERAS Pathway, Hiatal Hernia Repair;  Surgeon: Kinsinger, De Blanch, MD;  Location: [facility];  Service: General;  Laterality: N/A;   LAPAROSCOPY N/A 02/17/2019   Procedure: LAPAROSCOPY DIAGNOSTIC CONVERTED TO OPEN, EVACUATION OF HEMATOMA AND LIGATION OF VESSEL;  Surgeon: Kinsinger, De Blanch, MD;  Location: [facility];  Service: General;  Laterality: N/A;   LAPAROSCOPY N/A 07/01/2019   Procedure: Laparoscopy Diagnostic;  Surgeon: Sheliah Hatch De Blanch, MD;  Location: [facility] OR;  Service: General;  Laterality: N/A;   SMALL INTESTINE SURGERY     Patient Active Problem List   Diagnosis Date Noted   Dizziness 05/26/2023   Chronic pain 01/01/2023   Epigastric pain 11/14/2022   Need for immunization against influenza 05/29/2022   History of gastric bypass 02/06/2022   Current every day smoker 09/12/2021   Hypertension 07/12/2021   Vitamin D deficiency 07/12/2021   Aortic atherosclerosis (HCC) 07/12/2021   Incisional hernia 07/01/2019   Postoperative nausea and vomiting 02/24/2019   Back pain 11/19/2016   GASTROESOPHAGEAL REFLUX DISEASE 06/26/2009   SARCOIDOSIS, PULMONARY 06/23/2009    PCP: Gilmore Laroche, FNP  REFERRING PROVIDER: Ellwood Sayers, MD (pain management)   REFERRING DIAG:  Low back pain   Rationale for Evaluation and Treatment: Rehabilitation   THERAPY DIAG:  Chronic midline low back pain with bilateral sciatica  Difficulty in walking, not elsewhere classified  ONSET DATE: Acute on chronic >5 years  SUBJECTIVE:                                                                                                                                                                                           SUBJECTIVE STATEMENT:     Patient reports 8/10 pain on arrival but feels better mentally.    PERTINENT HISTORY:  [name] is a 61yoF who is  referred by pain management to OPPT with chronic low back pain. Pt reports chronic pain in bilat hips, knees, ankles, is unable to say if any of these are related to her back pain. Pt previously seen by our services in 2018 with similar issue. Pt reports she is disabled, at home most days, struggles through her housework tasks (making the bed) despite severe pain, has found ways to adapt many tasks to a sitting set up (cleaning the bathroom, washing dishes). PMH: sarcoidosis with related pain in many areas.  Current pain: 9/10 Worst pain: 9.5/10 Aggravating factors: bending forward, vacuuming, sitting too long. Alleviating factors: very hot showers, daily use of icy hot; takes hydrocodone PRN.   PAIN:  Are you having pain? 8/10 (just above the gluteal cleft)   PRECAUTIONS: None  WEIGHT BEARING RESTRICTIONS: None   FALLS:  Has patient fallen in last 6 months? No  LIVING ENVIRONMENT: Lives with: husband  Stairs: lives on main level, no need to access 2nd floor on regular, no entry stairs  OCCUPATION: on disability  PLOF: modified independent  PATIENT GOALS: improved pain control, improved ability to manage homecare tasks   NEXT MD VISIT: unknown  OBJECTIVE:  Note: Objective measures were completed at Evaluation unless otherwise noted.  DIAGNOSTIC FINDINGS:  Left Lumbar MRI 2018:  "IMPRESSION: Progressive degenerative disease at L3-4 where there are new bilateral facet joint effusions and a shallow disc bulge. The central canal remains open but mild to moderate bilateral foraminal narrowing has slightly worsened since the prior MRI.   Progressive spondylosis at L4-5 since the prior MRI where there is a new shallow left paracentral protrusion superimposed on a small disc bulge. Left greater than right subarticular recess narrowing is present which could irritate either descending L5 root."  PATIENT SURVEYS:  FOTO: 31  COGNITION: Overall cognitive status: Within functional  limits for tasks assessed     SENSATION: Pt reports intact  PALPATION: Deferred to later visit PRN  LUMBAR PHYSIOLOGICAL MOVEMENT:   AROM  Eval (06/06/23)  Flexion (seated forward flexion) Slight maintained lordosis)  Extension deferred  Right lateral flexion deferred  Left lateral flexion deferred  Right rotation deferred  Left rotation deferred     FUNCTIONAL TESTS:  -5xSTS: 15sec hands free  - : 0.37m/s, fairly antalgic, increases pain   TODAY'S TREATMENT:                                                                                                                              DATE:  06/25/23:   Nustep level 2 x 5 minutes (seat 8)  Manual: Supine general LE stretching (hamstrings, knee to chest, piriformis) x 15 minutes   TE:  Seated marching with GTB 2 x 10 each  Seated hip abduction with GTB 2 x 10 each   Standing paloff press 2 x 10 each with GTB  06/23/23: Nustep level 2 x 5 minutes (seat 8) Supine lower trunk rotation x 10 each side  Hooklying knee fall outs 2 x 10 each side with GTB around knees  Supine marching with GTB around knees 2 x 10 each LE  Seated hamstring stretch 3 x 30 seconds each LE Seated paloff press with GTB x 10 each side   Manual: Supine general LE stretching (hamstrings, knee to chest, piriformis) x 10 minutes   06/18/23: Lying prone x 3 minutes  Prone press up x 10 - no reduction in pain  Seated lumbar extension x 10  Seated forward trunk flexion stretch x 10 with 5 second hold  Seated paloff press with RTB 2 x 10 each side Seated rows with RTB 2 x 10  Seated shoulder extension with RTB 2 x 10  06/16/23:  Manual: Prone STM to lumbar paraspinals and B piriformis x 10 minutes.   TE:  MH applied to low back during exercises  Supine lower trunk rotation x 15 each side Supine marching 2 x 10 each LE, with GTB around knees x 10  Supine bridge x 10 each LE  Supine clamshell with RTB around knees x 10 each LE, upgraded to GTB  x 10  Seated forward trunk stretch with physioball x 10 fwd/x10 anterolateral each side   06/11/23: Educated on Estim for pain control:  IFC x 10 min with intesity at 31 with MHP x during therex Supine: LTR SKTC 3x 20" DKTC 2x 20" Transverse abdominis x 2 min paired with exhale Posterior pelvic tilt x 1 min Marching with ab set 10x 5" Glut set  Sidelying clam with RTB  06/09/23: Reviewed goals  Reviewed HEP compliance Educated log rolling Supine: LTR Marching with cueing for ab stability SKTC 3x 20" Piriformis 90/90 with towel assistance Diaphragmatic breathing x 2 min Transverse abdominis x 2 min paired with exhale Isometric add/abd alternating squeeze ball then abd into belt 20x 5" Glut sets 10x5" Clam 10x   PATIENT EDUCATION:  Education details: importance of self pacing for home tasks, modification of others;   -HEP  activities Person educated: patient Education method: explanation Education comprehension: pt confirms  HOME EXERCISE PROGRAM: Access Code: [email or web address] URL: [email or web address] Date: 06/08/2023 Prepared by: Alvera Novel  Exercises - Supine Lower Trunk Rotation  - 1 x daily - 7 x weekly - 3 sets - 10 reps - Supine Single Knee to Chest Stretch  - 1 x daily - 7 x weekly - 3 sets - 10 reps - Supine March  - 1 x daily - 7 x weekly - 3 sets - 10 reps  06/09/23: - Supine Diaphragmatic Breathing  - 2-3 x daily - 7 x weekly - 3 sets - 10 reps - 5" hold - Seated Transversus Abdominis Bracing  - 1 x daily - 7 x weekly - 3 sets - 10 reps - Supine Transversus Abdominis Bracing - Hands on Stomach  - 1 x daily - 7 x weekly - 3 sets - 10 reps - Hooklying Gluteal Sets  - 1 x daily - 7 x weekly - 3 sets - 10 reps - Hooklying Isometric Hip Abduction Adduction with Belt and Ball  - 1 x daily - 7 x weekly - 3 sets - 10 reps  ASSESSMENT:  CLINICAL IMPRESSION:      Patient arrives to treatment session with continued severe pain in low back,  however patient feels improved mentally. Session focused general LE stretching and core strengthening in sitting and standing. Tolerated session well and seemed to improve back pain despite patient being skeptical and not wanting to state if it felt better yet due to potential pain later in the day. Patient will continue to benefit from skilled PT interventions to address remaining deficits to improve QOL and reduction in pain.     OBJECTIVE IMPAIRMENTS: Abnormal gait, cardiopulmonary status limiting activity, decreased activity tolerance, decreased balance, decreased cognition, decreased knowledge of condition, decreased knowledge of use of DME, decreased mobility, difficulty walking, decreased ROM, and decreased strength  ACTIVITY LIMITATIONS: carrying, lifting, bending, sitting, standing, squatting, transfers, and bed mobility  PARTICIPATION LIMITATIONS: meal prep, cleaning, laundry, personal finances, interpersonal relationship, driving, shopping, community activity, occupation, and yard work  PERSONAL FACTORS: Age, Behavior pattern, Fitness, Past/current experiences, Profession, Sex, Social background, Time since onset of injury/illness/exacerbation, and Transportation are also affecting patient's functional outcome.   REHAB POTENTIAL: Good  CLINICAL DECISION MAKING: Unstable/unpredictable  EVALUATION COMPLEXITY: Moderate   GOALS: Goals reviewed with patient? No  SHORT TERM GOALS: Target date: 07/20/23  Pt to report tolerance for AMB 2-3x daily up to 5 minutes to improve general wellness/fitness.  Baseline: only walks to door to car (farthest distance) as needed Goal status: INITIAL  2.  Pt to demonstrate 572ft continuous AMB >0.57m/s to indicate ability to perform limited community distances at appropriate speeds for energy conservation.  Baseline: not going out ad lib unless necessary  Goal status: INITIAL  3.  Pt to demonstrate 5xSTS hands free <12sec without pain exacerbation.   Baseline: 15sec  Goal status: INITIAL  LONG TERM GOALS: Target date: 07/18/23  Pt to improve FOTO score > 50 to indicated reduced difficulty with performance of mobility for household tasks.  Baseline: eval: 31 Goal status: INITIAL  2.  Pt to demonstrate AMB tolerance >1057ft @ >0.15m/s to improve ability to access community as needed for leisure and social participation.  Baseline:  Goal status: INITIAL  3.  Pt to reports performance of and consistency with regular HEP to help with long-term pain management maintenance and avoidance of deconditioning in strength.  Baseline:  Goal status:  INITIAL  4.  Pt to demonstrate self selected gait speed over 10MWT>1.50m/s to indicated improved AMB toelerance in household.  Baseline: 0.63m/s Goal status: INITIAL PLAN:  PT FREQUENCY: 2x/week  PT DURATION: 6 weeks  PLANNED INTERVENTIONS: 97110-Therapeutic exercises, 97530- Therapeutic activity, [id number]- Neuromuscular re-education, 97535- Self Care, 53664- Manual therapy, [phone]- Gait training, [phone]- Orthotic Fit/training, 97014- Electrical stimulation (unattended), [phone]- Electrical stimulation (manual), Patient/Family education, Balance training, Joint mobilization, Spinal manipulation, Spinal mobilization, and Moist heat.  PLAN FOR NEXT SESSION: FU on HEP performance, any remaining examination measures as seen fit; core strengthening, pain control    Viviann Spare, PT, DPT 06/25/2023, 8:27 AM

## 2023-06-25 ENCOUNTER — Ambulatory Visit (HOSPITAL_COMMUNITY): Payer: Medicare HMO

## 2023-06-25 ENCOUNTER — Encounter (HOSPITAL_COMMUNITY): Payer: Self-pay

## 2023-06-25 DIAGNOSIS — M5442 Lumbago with sciatica, left side: Secondary | ICD-10-CM | POA: Diagnosis not present

## 2023-06-25 DIAGNOSIS — R262 Difficulty in walking, not elsewhere classified: Secondary | ICD-10-CM

## 2023-06-25 DIAGNOSIS — G8929 Other chronic pain: Secondary | ICD-10-CM | POA: Diagnosis not present

## 2023-06-25 DIAGNOSIS — M5441 Lumbago with sciatica, right side: Secondary | ICD-10-CM | POA: Diagnosis not present

## 2023-06-27 NOTE — Therapy (Signed)
OUTPATIENT PHYSICAL THERAPY TREATMENT   Patient Name: [name] MRN: [id number] DOB:Sep 18, 1960, 62 y.o., female Today's Date: 06/30/2023  END OF SESSION:  PT End of Session - 06/30/23 0800     Visit Number 8    Number of Visits 12    Date for PT Re-Evaluation 07/18/23    Authorization Type [employer] HMO/PPO; Medicaid of [place] 2ndary    Authorization Time Period 06/06/23-07/18/23    Progress Note Due on Visit 10    PT Start Time 0801    PT Stop Time 0841    PT Time Calculation (min) 40 min    Activity Tolerance Patient limited by pain;Patient tolerated treatment well    Behavior During Therapy [facility] for tasks assessed/performed               Past Medical History:  Diagnosis Date   Anemia    Anxiety    Arthritis    back, hands , knees    Chest pain 2010   due to sarcoidosis- per pt.    Chronic pain    Depression    GERD (gastroesophageal reflux disease)    Hypercholesterolemia    Hypertension    Pneumonia    sarcoidosis- in remission    Pulmonary sarcoidosis (HCC)    Treatment with Prednisone   Past Surgical History:  Procedure Laterality Date   ABDOMINAL HYSTERECTOMY     APPENDECTOMY     CHOLECYSTECTOMY     EXCISION MASS ABDOMINAL N/A 03/20/2020   Procedure: Excision of anterior abdominal wall fat necrosis;  Surgeon: Allena Napoleon, MD;  Location:  SURGERY CENTER;  Service: Plastics;  Laterality: N/A;   HERNIA REPAIR     INCISIONAL HERNIA REPAIR N/A 07/01/2019   Procedure: OPEN INCISIONAL HERNIA REPAIR WITH MESH;  Surgeon: Kinsinger, De Blanch, MD;  Location: [facility] OR;  Service: General;  Laterality: N/A;   INCISIONAL HERNIA REPAIR N/A 02/20/2021   Procedure: LAPAROSCOPIC RECURRENT INCISIONAL HERNIA REPAIR WITH MESH;  Surgeon: Sheliah Hatch De Blanch, MD;  Location: [facility] ORS;  Service: General;  Laterality: N/A;   INSERTION OF MESH N/A 07/01/2019   Procedure: Insertion Of Mesh;  Surgeon: Kinsinger, De Blanch, MD;  Location: [facility] OR;  Service:  General;  Laterality: N/A;   LAPAROSCOPIC GASTRIC SLEEVE RESECTION N/A 02/15/2019   Procedure: LAPAROSCOPIC GASTRIC SLEEVE RESECTION, UPPER ENDO, ERAS Pathway, Hiatal Hernia Repair;  Surgeon: Kinsinger, De Blanch, MD;  Location: [facility] ORS;  Service: General;  Laterality: N/A;   LAPAROSCOPY N/A 02/17/2019   Procedure: LAPAROSCOPY DIAGNOSTIC CONVERTED TO OPEN, EVACUATION OF HEMATOMA AND LIGATION OF VESSEL;  Surgeon: Kinsinger, De Blanch, MD;  Location: [facility] OR[facility];  Service: General;  Laterality: N/A;   LAPAROSCOPY N/A 07/01/2019   Procedure: Laparoscopy Diagnostic;  Surgeon: Sheliah Hatch De Blanch, MD;  Location: [facility] OR;  Service: General;  Laterality: N/A;   SMALL INTESTINE SURGERY     Patient Active Problem List   Diagnosis Date Noted   Dizziness 05/26/2023   Chronic pain 01/01/2023   Epigastric pain 11/14/2022   Need for immunization against influenza 05/29/2022   History of gastric bypass 02/06/2022   Current every day smoker 09/12/2021   Hypertension 07/12/2021   Vitamin D deficiency 07/12/2021   Aortic atherosclerosis (HCC) 07/12/2021   Incisional hernia 07/01/2019   Postoperative nausea and vomiting 02/24/2019   Back pain 11/19/2016   GASTROESOPHAGEAL REFLUX DISEASE 06/26/2009   SARCOIDOSIS, PULMONARY 06/23/2009    PCP: Gilmore Laroche, FNP  REFERRING PROVIDER: Resa Miner, MD (pain management)  REFERRING DIAG: Low back pain   Rationale for Evaluation and Treatment: Rehabilitation   THERAPY DIAG:  Chronic midline low back pain with bilateral sciatica  Difficulty in walking, not elsewhere classified  ONSET DATE: Acute on chronic >5 years  SUBJECTIVE:                                                                                                                                                                                           SUBJECTIVE STATEMENT:      Patient reports usual pain and is wearing her TENS unit.    PERTINENT HISTORY:  [name] is a 61yoF who  is referred by pain management to OPPT with chronic low back pain. Pt reports chronic pain in bilat hips, knees, ankles, is unable to say if any of these are related to her back pain. Pt previously seen by our services in 2018 with similar issue. Pt reports she is disabled, at home most days, struggles through her housework tasks (making the bed) despite severe pain, has found ways to adapt many tasks to a sitting set up (cleaning the bathroom, washing dishes). PMH: sarcoidosis with related pain in many areas.  Current pain: 9/10 Worst pain: 9.5/10 Aggravating factors: bending forward, vacuuming, sitting too long. Alleviating factors: very hot showers, daily use of icy hot; takes hydrocodone PRN.   PAIN:  Are you having pain? 8/10 (just above the gluteal cleft)   PRECAUTIONS: None  WEIGHT BEARING RESTRICTIONS: None   FALLS:  Has patient fallen in last 6 months? No  LIVING ENVIRONMENT: Lives with: husband  Stairs: lives on main level, no need to access 2nd floor on regular, no entry stairs  OCCUPATION: on disability  PLOF: modified independent  PATIENT GOALS: improved pain control, improved ability to manage homecare tasks   NEXT MD VISIT: unknown  OBJECTIVE:  Note: Objective measures were completed at Evaluation unless otherwise noted.  DIAGNOSTIC FINDINGS:  Left Lumbar MRI 2018:  "IMPRESSION: Progressive degenerative disease at L3-4 where there are new bilateral facet joint effusions and a shallow disc bulge. The central canal remains open but mild to moderate bilateral foraminal narrowing has slightly worsened since the prior MRI.   Progressive spondylosis at L4-5 since the prior MRI where there is a new shallow left paracentral protrusion superimposed on a small disc bulge. Left greater than right subarticular recess narrowing is present which could irritate either descending L5 root."  PATIENT SURVEYS:  FOTO: 31  COGNITION: Overall cognitive status: Within  functional limits for tasks assessed     SENSATION: Pt reports intact  PALPATION: Deferred to later visit PRN  LUMBAR PHYSIOLOGICAL MOVEMENT:  AROM Eval (06/06/23)  Flexion (seated forward flexion) Slight maintained lordosis)  Extension deferred  Right lateral flexion deferred  Left lateral flexion deferred  Right rotation deferred  Left rotation deferred     FUNCTIONAL TESTS:  -5xSTS: 15sec hands free  - : 0.32m/s, fairly antalgic, increases pain   TODAY'S TREATMENT:                                                                                                                              DATE:  06/30/23:  Nustep level 3 x 6 minutes   Manual: Supine general LE stretching (hamstrings, knee to chest, piriformis) x 15 minutes   TE:  Supine lower trunk rotation x 10 each side  Supine knee to chest with feet on red physioball x 10  Wall plank 2 x 30 seconds Seated rows at [employer] #2 plate 3 x 10  Seated chest press at [employer] #2 plate 3 x 10  78/29/56:   Nustep level 2 x 5 minutes (seat 8)  Manual: Supine general LE stretching (hamstrings, knee to chest, piriformis) x 15 minutes   TE:  Seated marching with GTB 2 x 10 each  Seated hip abduction with GTB 2 x 10 each   Standing paloff press 2 x 10 each with GTB  06/23/23: Nustep level 2 x 5 minutes (seat 8) Supine lower trunk rotation x 10 each side  Hooklying knee fall outs 2 x 10 each side with GTB around knees  Supine marching with GTB around knees 2 x 10 each LE  Seated hamstring stretch 3 x 30 seconds each LE Seated paloff press with GTB x 10 each side   Manual: Supine general LE stretching (hamstrings, knee to chest, piriformis) x 10 minutes    PATIENT EDUCATION:  Education details: importance of self pacing for home tasks, modification of others;   -HEP activities Person educated: patient Education method: explanation Education comprehension: pt confirms  HOME EXERCISE PROGRAM: Access  Code: [email or web address] URL: [email or web address] Date: 06/08/2023 Prepared by: Alvera Novel  Exercises - Supine Lower Trunk Rotation  - 1 x daily - 7 x weekly - 3 sets - 10 reps - Supine Single Knee to Chest Stretch  - 1 x daily - 7 x weekly - 3 sets - 10 reps - Supine March  - 1 x daily - 7 x weekly - 3 sets - 10 reps  06/09/23: - Supine Diaphragmatic Breathing  - 2-3 x daily - 7 x weekly - 3 sets - 10 reps - 5" hold - Seated Transversus Abdominis Bracing  - 1 x daily - 7 x weekly - 3 sets - 10 reps - Supine Transversus Abdominis Bracing - Hands on Stomach  - 1 x daily - 7 x weekly - 3 sets - 10 reps - Hooklying Gluteal Sets  - 1 x daily - 7 x weekly - 3 sets - 10 reps - Hooklying Isometric Hip Abduction Adduction with Belt and Ball  -  1 x daily - 7 x weekly - 3 sets - 10 reps  ASSESSMENT:  CLINICAL IMPRESSION:        Patient arrives to treatment session with TENS unit on throughout session. Session focused on manual stretching and core strengthening. Tolerated treatment session well with increase in resistance. Continues to be limited by back pain but likely related to core weakness. Patient will continue to benefit from skilled PT interventions to address remaining deficits to improve QOL and reduction in pain.   OBJECTIVE IMPAIRMENTS: Abnormal gait, cardiopulmonary status limiting activity, decreased activity tolerance, decreased balance, decreased cognition, decreased knowledge of condition, decreased knowledge of use of DME, decreased mobility, difficulty walking, decreased ROM, and decreased strength  ACTIVITY LIMITATIONS: carrying, lifting, bending, sitting, standing, squatting, transfers, and bed mobility  PARTICIPATION LIMITATIONS: meal prep, cleaning, laundry, personal finances, interpersonal relationship, driving, shopping, community activity, occupation, and yard work  PERSONAL FACTORS: Age, Behavior pattern, Fitness, Past/current experiences, Profession, Sex, Social  background, Time since onset of injury/illness/exacerbation, and Transportation are also affecting patient's functional outcome.   REHAB POTENTIAL: Good  CLINICAL DECISION MAKING: Unstable/unpredictable  EVALUATION COMPLEXITY: Moderate   GOALS: Goals reviewed with patient? No  SHORT TERM GOALS: Target date: 07/20/23  Pt to report tolerance for AMB 2-3x daily up to 5 minutes to improve general wellness/fitness.  Baseline: only walks to door to car (farthest distance) as needed Goal status: INITIAL  2.  Pt to demonstrate 582ft continuous AMB >0.20m/s to indicate ability to perform limited community distances at appropriate speeds for energy conservation.  Baseline: not going out ad lib unless necessary  Goal status: INITIAL  3.  Pt to demonstrate 5xSTS hands free <12sec without pain exacerbation.  Baseline: 15sec  Goal status: INITIAL  LONG TERM GOALS: Target date: 07/18/23  Pt to improve FOTO score > 50 to indicated reduced difficulty with performance of mobility for household tasks.  Baseline: eval: 31 Goal status: INITIAL  2.  Pt to demonstrate AMB tolerance >1012ft @ >0.80m/s to improve ability to access community as needed for leisure and social participation.  Baseline:  Goal status: INITIAL  3.  Pt to reports performance of and consistency with regular HEP to help with long-term pain management maintenance and avoidance of deconditioning in strength.  Baseline:  Goal status: INITIAL  4.  Pt to demonstrate self selected gait speed over 10MWT>1.83m/s to indicated improved AMB toelerance in household.  Baseline: 0.77m/s Goal status: INITIAL PLAN:  PT FREQUENCY: 2x/week  PT DURATION: 6 weeks  PLANNED INTERVENTIONS: 97110-Therapeutic exercises, 97530- Therapeutic activity, [id number]- Neuromuscular re-education, 97535- Self Care, 84696- Manual therapy, [phone]- Gait training, [phone]- Orthotic Fit/training, 97014- Electrical stimulation (unattended), [phone]- Electrical stimulation  (manual), Patient/Family education, Balance training, Joint mobilization, Spinal manipulation, Spinal mobilization, and Moist heat.  PLAN FOR NEXT SESSION: FU on HEP performance, any remaining examination measures as seen fit; core strengthening, pain control    Viviann Spare, PT, DPT 06/30/2023, 8:01 AM

## 2023-06-30 ENCOUNTER — Ambulatory Visit (HOSPITAL_COMMUNITY): Payer: Medicare HMO

## 2023-06-30 ENCOUNTER — Encounter (HOSPITAL_COMMUNITY): Payer: Self-pay

## 2023-06-30 DIAGNOSIS — G8929 Other chronic pain: Secondary | ICD-10-CM | POA: Diagnosis not present

## 2023-06-30 DIAGNOSIS — M5442 Lumbago with sciatica, left side: Secondary | ICD-10-CM | POA: Diagnosis not present

## 2023-06-30 DIAGNOSIS — R262 Difficulty in walking, not elsewhere classified: Secondary | ICD-10-CM

## 2023-06-30 DIAGNOSIS — M5441 Lumbago with sciatica, right side: Secondary | ICD-10-CM | POA: Diagnosis not present

## 2023-07-01 NOTE — Therapy (Signed)
OUTPATIENT PHYSICAL THERAPY TREATMENT   Patient Name: [name] MRN: [id number] DOB:1961-04-25, 62 y.o., female Today's Date: 07/02/2023  END OF SESSION:  PT End of Session - 07/02/23 0759     Visit Number 9    Number of Visits 12    Date for PT Re-Evaluation 07/18/23    Authorization Type [employer] HMO/PPO; Medicaid of [place]    Authorization Time Period 06/06/23-07/18/23    Progress Note Due on Visit 10    PT Start Time 0801    PT Stop Time [phone]    PT Time Calculation (min) 41 min    Activity Tolerance Patient limited by pain;Patient tolerated treatment well    Behavior During Therapy [facility] for tasks assessed/performed                Past Medical History:  Diagnosis Date   Anemia    Anxiety    Arthritis    back, hands , knees    Chest pain 2010   due to sarcoidosis- per pt.    Chronic pain    Depression    GERD (gastroesophageal reflux disease)    Hypercholesterolemia    Hypertension    Pneumonia    sarcoidosis- in remission    Pulmonary sarcoidosis (HCC)    Treatment with Prednisone   Past Surgical History:  Procedure Laterality Date   ABDOMINAL HYSTERECTOMY     APPENDECTOMY     CHOLECYSTECTOMY     EXCISION MASS ABDOMINAL N/A 03/20/2020   Procedure: Excision of anterior abdominal wall fat necrosis;  Surgeon: Allena Napoleon, MD;  Location: [facility];  Service: Plastics;  Laterality: N/A;   HERNIA REPAIR     INCISIONAL HERNIA REPAIR N/A 07/01/2019   Procedure: OPEN INCISIONAL HERNIA REPAIR WITH MESH;  Surgeon: Kinsinger, De Blanch, MD;  Location: [facility] OR;  Service: General;  Laterality: N/A;   INCISIONAL HERNIA REPAIR N/A 02/20/2021   Procedure: LAPAROSCOPIC RECURRENT INCISIONAL HERNIA REPAIR WITH MESH;  Surgeon: Sheliah Hatch De Blanch, MD;  Location: [facility];  Service: General;  Laterality: N/A;   INSERTION OF MESH N/A 07/01/2019   Procedure: Insertion Of Mesh;  Surgeon: Kinsinger, De Blanch, MD;  Location: [facility] OR;  Service:  General;  Laterality: N/A;   LAPAROSCOPIC GASTRIC SLEEVE RESECTION N/A 02/15/2019   Procedure: LAPAROSCOPIC GASTRIC SLEEVE RESECTION, UPPER ENDO, ERAS Pathway, Hiatal Hernia Repair;  Surgeon: Kinsinger, De Blanch, MD;  Location: [facility];  Service: General;  Laterality: N/A;   LAPAROSCOPY N/A 02/17/2019   Procedure: LAPAROSCOPY DIAGNOSTIC CONVERTED TO OPEN, EVACUATION OF HEMATOMA AND LIGATION OF VESSEL;  Surgeon: Kinsinger, De Blanch, MD;  Location: [facility];  Service: General;  Laterality: N/A;   LAPAROSCOPY N/A 07/01/2019   Procedure: Laparoscopy Diagnostic;  Surgeon: Sheliah Hatch De Blanch, MD;  Location: [facility] OR;  Service: General;  Laterality: N/A;   SMALL INTESTINE SURGERY     Patient Active Problem List   Diagnosis Date Noted   Dizziness 05/26/2023   Chronic pain 01/01/2023   Epigastric pain 11/14/2022   Need for immunization against influenza 05/29/2022   History of gastric bypass 02/06/2022   Current every day smoker 09/12/2021   Hypertension 07/12/2021   Vitamin D deficiency 07/12/2021   Aortic atherosclerosis (HCC) 07/12/2021   Incisional hernia 07/01/2019   Postoperative nausea and vomiting 02/24/2019   Back pain 11/19/2016   GASTROESOPHAGEAL REFLUX DISEASE 06/26/2009   SARCOIDOSIS, PULMONARY 06/23/2009    PCP: Allison Laroche, FNP  REFERRING PROVIDER: Resa Miner, MD (pain management)  REFERRING DIAG: Low back pain   Rationale for Evaluation and Treatment: Rehabilitation   THERAPY DIAG:  Chronic midline low back pain with bilateral sciatica  Difficulty in walking, not elsewhere classified  ONSET DATE: Acute on chronic >5 years  SUBJECTIVE:                                                                                                                                                                                           SUBJECTIVE STATEMENT:       Patient reports more pain this AM and is wearing her TENS unit.    PERTINENT HISTORY:  [name] is a  62yoF who is referred by pain management to OPPT with chronic low back pain. Pt reports chronic pain in bilat hips, knees, ankles, is unable to say if any of these are related to her back pain. Pt previously seen by our services in 2018 with similar issue. Pt reports she is disabled, at home most days, struggles through her housework tasks (making the bed) despite severe pain, has found ways to adapt many tasks to a sitting set up (cleaning the bathroom, washing dishes). PMH: sarcoidosis with related pain in many areas.  Current pain: 9/10 Worst pain: 9.5/10 Aggravating factors: bending forward, vacuuming, sitting too long. Alleviating factors: very hot showers, daily use of icy hot; takes hydrocodone PRN.   PAIN:  Are you having pain? 8/10 (just above the gluteal cleft)   PRECAUTIONS: None  WEIGHT BEARING RESTRICTIONS: None   FALLS:  Has patient fallen in last 6 months? No  LIVING ENVIRONMENT: Lives with: husband  Stairs: lives on main level, no need to access 2nd floor on regular, no entry stairs  OCCUPATION: on disability  PLOF: modified independent  PATIENT GOALS: improved pain control, improved ability to manage homecare tasks   NEXT MD VISIT: unknown  OBJECTIVE:  Note: Objective measures were completed at Evaluation unless otherwise noted.  DIAGNOSTIC FINDINGS:  Left Lumbar MRI 2018:  "IMPRESSION: Progressive degenerative disease at L3-4 where there are new bilateral facet joint effusions and a shallow disc bulge. The central canal remains open but mild to moderate bilateral foraminal narrowing has slightly worsened since the prior MRI.   Progressive spondylosis at L4-5 since the prior MRI where there is a new shallow left paracentral protrusion superimposed on a small disc bulge. Left greater than right subarticular recess narrowing is present which could irritate either descending L5 root."  PATIENT SURVEYS:  FOTO: 31  COGNITION: Overall cognitive status:  Within functional limits for tasks assessed     SENSATION: Pt reports intact  PALPATION: Deferred to later visit PRN  LUMBAR PHYSIOLOGICAL MOVEMENT:   AROM Eval (06/06/23)  Flexion (seated forward flexion) Slight maintained lordosis)  Extension deferred  Right lateral flexion deferred  Left lateral flexion deferred  Right rotation deferred  Left rotation deferred     FUNCTIONAL TESTS:  -5xSTS: 15sec hands free  - : 0.68m/s, fairly antalgic, increases pain   TODAY'S TREATMENT:                                                                                                                              DATE:  07/02/23:  Nustep level 3 x 6 minutes   Manual: Supine general LE stretching (hamstrings, knee to chest, piriformis) x 10 minutes   TE:  Leg press #3 plate 2 x 10, #4 plate x 10   Standing paloff press 3# plate 3 x 10 each side  Seated row with #2 plate 2 x 10   40/98/11:  Nustep level 3 x 6 minutes   Manual: Supine general LE stretching (hamstrings, knee to chest, piriformis) x 15 minutes   TE:  Supine lower trunk rotation x 10 each side  Supine knee to chest with feet on red physioball x 10  Wall plank 2 x 30 seconds Seated rows at [employer] #2 plate 3 x 10  Seated chest press at [employer] #2 plate 3 x 10  91/47/82:   Nustep level 2 x 5 minutes (seat 8)  Manual: Supine general LE stretching (hamstrings, knee to chest, piriformis) x 15 minutes   TE:  Seated marching with GTB 2 x 10 each  Seated hip abduction with GTB 2 x 10 each   Standing paloff press 2 x 10 each with GTB  06/23/23: Nustep level 2 x 5 minutes (seat 8) Supine lower trunk rotation x 10 each side  Hooklying knee fall outs 2 x 10 each side with GTB around knees  Supine marching with GTB around knees 2 x 10 each LE  Seated hamstring stretch 3 x 30 seconds each LE Seated paloff press with GTB x 10 each side   Manual: Supine general LE stretching (hamstrings, knee to chest,  piriformis) x 10 minutes    PATIENT EDUCATION:  Education details: importance of self pacing for home tasks, modification of others;   -HEP activities Person educated: patient Education method: explanation Education comprehension: pt confirms  HOME EXERCISE PROGRAM: Access Code: [email or web address] URL: [email or web address] Date: 06/08/2023 Prepared by: Alvera Novel  Exercises - Supine Lower Trunk Rotation  - 1 x daily - 7 x weekly - 3 sets - 10 reps - Supine Single Knee to Chest Stretch  - 1 x daily - 7 x weekly - 3 sets - 10 reps - Supine March  - 1 x daily - 7 x weekly - 3 sets - 10 reps  06/09/23: - Supine Diaphragmatic Breathing  - 2-3 x daily - 7 x weekly - 3 sets - 10 reps - 5" hold - Seated Transversus Abdominis Bracing  - 1  x daily - 7 x weekly - 3 sets - 10 reps - Supine Transversus Abdominis Bracing - Hands on Stomach  - 1 x daily - 7 x weekly - 3 sets - 10 reps - Hooklying Gluteal Sets  - 1 x daily - 7 x weekly - 3 sets - 10 reps - Hooklying Isometric Hip Abduction Adduction with Belt and Ball  - 1 x daily - 7 x weekly - 3 sets - 10 reps  ASSESSMENT:  CLINICAL IMPRESSION:         Patient arrives to treatment session with TENS unit on throughout session. Session focused on manual stretching and core strengthening. Tolerated treatment session well with increase in resistance and new exercises. Continues to be limited by back pain but likely related to core weakness. Patient will continue to benefit from skilled PT interventions to address remaining deficits to improve QOL and reduction in pain.   OBJECTIVE IMPAIRMENTS: Abnormal gait, cardiopulmonary status limiting activity, decreased activity tolerance, decreased balance, decreased cognition, decreased knowledge of condition, decreased knowledge of use of DME, decreased mobility, difficulty walking, decreased ROM, and decreased strength  ACTIVITY LIMITATIONS: carrying, lifting, bending, sitting, standing, squatting,  transfers, and bed mobility  PARTICIPATION LIMITATIONS: meal prep, cleaning, laundry, personal finances, interpersonal relationship, driving, shopping, community activity, occupation, and yard work  PERSONAL FACTORS: Age, Behavior pattern, Fitness, Past/current experiences, Profession, Sex, Social background, Time since onset of injury/illness/exacerbation, and Transportation are also affecting patient's functional outcome.   REHAB POTENTIAL: Good  CLINICAL DECISION MAKING: Unstable/unpredictable  EVALUATION COMPLEXITY: Moderate   GOALS: Goals reviewed with patient? No  SHORT TERM GOALS: Target date: 07/20/23  Pt to report tolerance for AMB 2-3x daily up to 5 minutes to improve general wellness/fitness.  Baseline: only walks to door to car (farthest distance) as needed Goal status: INITIAL  2.  Pt to demonstrate 573ft continuous AMB >0.36m/s to indicate ability to perform limited community distances at appropriate speeds for energy conservation.  Baseline: not going out ad lib unless necessary  Goal status: INITIAL  3.  Pt to demonstrate 5xSTS hands free <12sec without pain exacerbation.  Baseline: 15sec  Goal status: INITIAL  LONG TERM GOALS: Target date: 07/18/23  Pt to improve FOTO score > 50 to indicated reduced difficulty with performance of mobility for household tasks.  Baseline: eval: 31 Goal status: INITIAL  2.  Pt to demonstrate AMB tolerance >1061ft @ >0.78m/s to improve ability to access community as needed for leisure and social participation.  Baseline:  Goal status: INITIAL  3.  Pt to reports performance of and consistency with regular HEP to help with long-term pain management maintenance and avoidance of deconditioning in strength.  Baseline:  Goal status: INITIAL  4.  Pt to demonstrate self selected gait speed over 10MWT>1.72m/s to indicated improved AMB toelerance in household.  Baseline: 0.75m/s Goal status: INITIAL PLAN:  PT FREQUENCY: 2x/week  PT  DURATION: 6 weeks  PLANNED INTERVENTIONS: 97110-Therapeutic exercises, 97530- Therapeutic activity, [id number]- Neuromuscular re-education, 97535- Self Care, 16109- Manual therapy, [phone]- Gait training, [phone]- Orthotic Fit/training, 97014- Electrical stimulation (unattended), [phone]- Electrical stimulation (manual), Patient/Family education, Balance training, Joint mobilization, Spinal manipulation, Spinal mobilization, and Moist heat.  PLAN FOR NEXT SESSION: FU on HEP performance, any remaining examination measures as seen fit; core strengthening, pain control    Viviann Spare, PT, DPT 07/02/2023, 8:00 AM

## 2023-07-02 ENCOUNTER — Ambulatory Visit (HOSPITAL_COMMUNITY): Payer: Medicare HMO

## 2023-07-02 ENCOUNTER — Encounter (HOSPITAL_COMMUNITY): Payer: Self-pay

## 2023-07-02 DIAGNOSIS — M5441 Lumbago with sciatica, right side: Secondary | ICD-10-CM | POA: Diagnosis not present

## 2023-07-02 DIAGNOSIS — G8929 Other chronic pain: Secondary | ICD-10-CM

## 2023-07-02 DIAGNOSIS — R262 Difficulty in walking, not elsewhere classified: Secondary | ICD-10-CM | POA: Diagnosis not present

## 2023-07-02 DIAGNOSIS — M5442 Lumbago with sciatica, left side: Secondary | ICD-10-CM | POA: Diagnosis not present

## 2023-07-04 DIAGNOSIS — G8929 Other chronic pain: Secondary | ICD-10-CM | POA: Diagnosis not present

## 2023-07-04 DIAGNOSIS — Z6836 Body mass index (BMI) 36.0-36.9, adult: Secondary | ICD-10-CM | POA: Diagnosis not present

## 2023-07-04 DIAGNOSIS — M25562 Pain in left knee: Secondary | ICD-10-CM | POA: Diagnosis not present

## 2023-07-04 DIAGNOSIS — M545 Low back pain, unspecified: Secondary | ICD-10-CM | POA: Diagnosis not present

## 2023-07-04 DIAGNOSIS — M25561 Pain in right knee: Secondary | ICD-10-CM | POA: Diagnosis not present

## 2023-07-04 DIAGNOSIS — Z79899 Other long term (current) drug therapy: Secondary | ICD-10-CM | POA: Diagnosis not present

## 2023-07-04 NOTE — Therapy (Signed)
OUTPATIENT PHYSICAL THERAPY TREATMENT Physical Therapy Progress Note   Dates of reporting period  06/06/23   to   07/07/23   Patient Name: Hala Ivory MRN: [id number] DOB:Apr 30, 1961, 62 y.o., female Today's Date: 07/07/2023  END OF SESSION:  PT End of Session - 07/07/23 0752     Visit Number 10    Number of Visits 13    Date for PT Re-Evaluation 07/18/23    Authorization Type [employer] HMO/PPO; Medicaid of [place] [employer]ary    Authorization Time Period 06/06/23-07/18/23    Progress Note Due on Visit 10    PT Start Time 0801    PT Stop Time [phone]    PT Time Calculation (min) 41 min    Activity Tolerance Patient limited by pain;Patient tolerated treatment well    Behavior During Therapy [facility] for tasks assessed/performed              Past Medical History:  Diagnosis Date   Anemia    Anxiety    Arthritis    back, hands , knees    Chest pain 2010   due to sarcoidosis- per pt.    Chronic pain    Depression    GERD (gastroesophageal reflux disease)    Hypercholesterolemia    Hypertension    Pneumonia    sarcoidosis- in remission    Pulmonary sarcoidosis (HCC)    Treatment with Prednisone   Past Surgical History:  Procedure Laterality Date   ABDOMINAL HYSTERECTOMY     APPENDECTOMY     CHOLECYSTECTOMY     EXCISION MASS ABDOMINAL N/A 03/20/2020   Procedure: Excision of anterior abdominal wall fat necrosis;  Surgeon: Allena Napoleon, MD;  Location: [facility];  Service: Plastics;  Laterality: N/A;   HERNIA REPAIR     INCISIONAL HERNIA REPAIR N/A 07/01/2019   Procedure: OPEN INCISIONAL HERNIA REPAIR WITH MESH;  Surgeon: Kinsinger, De Blanch, MD;  Location: [facility] OR;  Service: General;  Laterality: N/A;   INCISIONAL HERNIA REPAIR N/A 02/20/2021   Procedure: LAPAROSCOPIC RECURRENT INCISIONAL HERNIA REPAIR WITH MESH;  Surgeon: Sheliah Hatch De Blanch, MD;  Location: [facility];  Service: General;  Laterality: N/A;   INSERTION OF MESH N/A 07/01/2019   Procedure:  Insertion Of Mesh;  Surgeon: Kinsinger, De Blanch, MD;  Location: [facility] OR;  Service: General;  Laterality: N/A;   LAPAROSCOPIC GASTRIC SLEEVE RESECTION N/A 02/15/2019   Procedure: LAPAROSCOPIC GASTRIC SLEEVE RESECTION, UPPER ENDO, ERAS Pathway, Hiatal Hernia Repair;  Surgeon: Kinsinger, De Blanch, MD;  Location: [facility];  Service: General;  Laterality: N/A;   LAPAROSCOPY N/A 02/17/2019   Procedure: LAPAROSCOPY DIAGNOSTIC CONVERTED TO OPEN, EVACUATION OF HEMATOMA AND LIGATION OF VESSEL;  Surgeon: Kinsinger, De Blanch, MD;  Location: [facility];  Service: General;  Laterality: N/A;   LAPAROSCOPY N/A 07/01/2019   Procedure: Laparoscopy Diagnostic;  Surgeon: Sheliah Hatch De Blanch, MD;  Location: [facility] OR;  Service: General;  Laterality: N/A;   SMALL INTESTINE SURGERY     Patient Active Problem List   Diagnosis Date Noted   Dizziness 05/26/2023   Chronic pain 01/01/2023   Epigastric pain 11/14/2022   Need for immunization against influenza 05/29/2022   History of gastric bypass 02/06/2022   Current every day smoker 09/12/2021   Hypertension 07/12/2021   Vitamin D deficiency 07/12/2021   Aortic atherosclerosis (HCC) 07/12/2021   Incisional hernia 07/01/2019   Postoperative nausea and vomiting 02/24/2019   Back pain 11/19/2016   GASTROESOPHAGEAL REFLUX DISEASE 06/26/2009   SARCOIDOSIS, PULMONARY 06/23/2009  PCP: Gilmore Laroche, FNP  REFERRING PROVIDER: Resa Miner, MD (pain management)   REFERRING DIAG: Low back pain   Rationale for Evaluation and Treatment: Rehabilitation   THERAPY DIAG:  Chronic midline low back pain with bilateral sciatica  Difficulty in walking, not elsewhere classified  ONSET DATE: Acute on chronic >5 years  SUBJECTIVE:                                                                                                                                                                                           SUBJECTIVE STATEMENT:        Patient with significant pain  in low back this date compared to previous dates. Wearing TENS on arrival but not helping. Rates 9/10 pain in low back.    PERTINENT HISTORY:  Dezarai Caporale is a 61yoF who is referred by pain management to OPPT with chronic low back pain. Pt reports chronic pain in bilat hips, knees, ankles, is unable to say if any of these are related to her back pain. Pt previously seen by our services in 2018 with similar issue. Pt reports she is disabled, at home most days, struggles through her housework tasks (making the bed) despite severe pain, has found ways to adapt many tasks to a sitting set up (cleaning the bathroom, washing dishes). PMH: sarcoidosis with related pain in many areas.  Current pain: 9/10 Worst pain: 9.5/10 Aggravating factors: bending forward, vacuuming, sitting too long. Alleviating factors: very hot showers, daily use of icy hot; takes hydrocodone PRN.   PAIN:  Are you having pain? 8/10 (just above the gluteal cleft)   PRECAUTIONS: None  WEIGHT BEARING RESTRICTIONS: None   FALLS:  Has patient fallen in last 6 months? No  LIVING ENVIRONMENT: Lives with: husband  Stairs: lives on main level, no need to access 2nd floor on regular, no entry stairs  OCCUPATION: on disability  PLOF: modified independent  PATIENT GOALS: improved pain control, improved ability to manage homecare tasks   NEXT MD VISIT: unknown  OBJECTIVE:  Note: Objective measures were completed at Evaluation unless otherwise noted.  DIAGNOSTIC FINDINGS:  Left Lumbar MRI 2018:  "IMPRESSION: Progressive degenerative disease at L3-4 where there are new bilateral facet joint effusions and a shallow disc bulge. The central canal remains open but mild to moderate bilateral foraminal narrowing has slightly worsened since the prior MRI.   Progressive spondylosis at L4-5 since the prior MRI where there is a new shallow left paracentral protrusion superimposed on a small disc bulge. Left greater than right  subarticular recess narrowing is present which could irritate either descending L5 root."  PATIENT SURVEYS:  FOTO: 31  COGNITION: Overall cognitive status: Within functional limits for tasks assessed     SENSATION: Pt reports intact  PALPATION: Deferred to later visit PRN  LUMBAR PHYSIOLOGICAL MOVEMENT:   AROM Eval (06/06/23)  Flexion (seated forward flexion) Slight maintained lordosis)  Extension deferred  Right lateral flexion deferred  Left lateral flexion deferred  Right rotation deferred  Left rotation deferred     FUNCTIONAL TESTS:  -5xSTS: 15sec hands free  - : 0.23m/s, fairly antalgic, increases pain   TODAY'S TREATMENT:                                                                                                                              DATE:  07/07/23:  Physical therapy treatment session today consisted of completing assessment of goals and administration of testing as demonstrated and documented in flow sheet, treatment, and goals section of this note. Addition treatments may be found below.   Nustep level 3 x 6 minutes with MH applied to low back  FOTO: 37 5xSTS: 38 seconds  Deferred ambulation testing this date due to significant amount of pain on arrival compared to previous dates and would not reflect accurate progress.    Manual:  STM to lumbar paraspinals and B QL x 8 minutes Supine general stretching (hamstrings, piriformis, knee to chest, lower trunk rotation) x 10 minutes   Prone propped on elbows with repeated extension x 10  Prone on hands with repeated extension x 5   07/02/23:  Nustep level 3 x 6 minutes   Manual: Supine general LE stretching (hamstrings, knee to chest, piriformis) x 10 minutes   TE:  Leg press #3 plate 2 x 10, #4 plate x 10   Standing paloff press 3# plate 3 x 10 each side  Seated row with #2 plate 2 x 10   09/81/19:  Nustep level 3 x 6 minutes   Manual: Supine general LE stretching (hamstrings, knee  to chest, piriformis) x 15 minutes   TE:  Supine lower trunk rotation x 10 each side  Supine knee to chest with feet on red physioball x 10  Wall plank 2 x 30 seconds Seated rows at [employer] #2 plate 3 x 10  Seated chest press at [employer] #2 plate 3 x 10  14/78/29:   Nustep level 2 x 5 minutes (seat 8)  Manual: Supine general LE stretching (hamstrings, knee to chest, piriformis) x 15 minutes   TE:  Seated marching with GTB 2 x 10 each  Seated hip abduction with GTB 2 x 10 each   Standing paloff press 2 x 10 each with GTB  06/23/23: Nustep level 2 x 5 minutes (seat 8) Supine lower trunk rotation x 10 each side  Hooklying knee fall outs 2 x 10 each side with GTB around knees  Supine marching with GTB around knees 2 x 10 each LE  Seated hamstring stretch 3 x 30 seconds each LE Seated paloff press with  GTB x 10 each side   Manual: Supine general LE stretching (hamstrings, knee to chest, piriformis) x 10 minutes    PATIENT EDUCATION:  Education details: importance of self pacing for home tasks, modification of others;   -HEP activities Person educated: patient Education method: explanation Education comprehension: pt confirms  HOME EXERCISE PROGRAM: Access Code: [email or web address] URL: [email or web address] Date: 06/08/2023 Prepared by: Alvera Novel  Exercises - Supine Lower Trunk Rotation  - 1 x daily - 7 x weekly - 3 sets - 10 reps - Supine Single Knee to Chest Stretch  - 1 x daily - 7 x weekly - 3 sets - 10 reps - Supine March  - 1 x daily - 7 x weekly - 3 sets - 10 reps  06/09/23: - Supine Diaphragmatic Breathing  - 2-3 x daily - 7 x weekly - 3 sets - 10 reps - 5" hold - Seated Transversus Abdominis Bracing  - 1 x daily - 7 x weekly - 3 sets - 10 reps - Supine Transversus Abdominis Bracing - Hands on Stomach  - 1 x daily - 7 x weekly - 3 sets - 10 reps - Hooklying Gluteal Sets  - 1 x daily - 7 x weekly - 3 sets - 10 reps - Hooklying Isometric Hip Abduction  Adduction with Belt and Ball  - 1 x daily - 7 x weekly - 3 sets - 10 reps  ASSESSMENT:  CLINICAL IMPRESSION:          Patient seen this date for 10th visit progress note. Patient arrives with significant pain this session compared to other sessions. Demonstrates regression in sit to stand due to current pain level. This does not reflect current level of progress made by patient, although pain has been rated high (8/10 consistently) throughout attendance of PT. Continues to be limited by back pain and core weakness. Pain seems to improve with manual therapy but not maintained after completion. Patient will continue to benefit from skilled PT services to address remaining deficits to improve QOL and reduction in pain.   OBJECTIVE IMPAIRMENTS: Abnormal gait, cardiopulmonary status limiting activity, decreased activity tolerance, decreased balance, decreased cognition, decreased knowledge of condition, decreased knowledge of use of DME, decreased mobility, difficulty walking, decreased ROM, and decreased strength  ACTIVITY LIMITATIONS: carrying, lifting, bending, sitting, standing, squatting, transfers, and bed mobility  PARTICIPATION LIMITATIONS: meal prep, cleaning, laundry, personal finances, interpersonal relationship, driving, shopping, community activity, occupation, and yard work  PERSONAL FACTORS: Age, Behavior pattern, Fitness, Past/current experiences, Profession, Sex, Social background, Time since onset of injury/illness/exacerbation, and Transportation are also affecting patient's functional outcome.   REHAB POTENTIAL: Good  CLINICAL DECISION MAKING: Unstable/unpredictable  EVALUATION COMPLEXITY: Moderate   GOALS: Goals reviewed with patient? No  SHORT TERM GOALS: Target date: 07/20/23  Pt to report tolerance for AMB 2-3x daily up to 5 minutes to improve general wellness/fitness.  Baseline: only walks to door to car (farthest distance) as needed Goal status: INITIAL  2.  Pt to  demonstrate 561ft continuous AMB >0.76m/s to indicate ability to perform limited community distances at appropriate speeds for energy conservation.  Baseline: not going out ad lib unless necessary  Goal status: INITIAL  3.  Pt to demonstrate 5xSTS hands free <12sec without pain exacerbation.  Baseline: 15sec; 11/25: 38 seconds - significant amount of pain on arrival which is limiting completion of this test  Goal status: INITIAL  LONG TERM GOALS: Target date: 07/18/23  Pt to improve FOTO score > 50 to indicated reduced  difficulty with performance of mobility for household tasks.  Baseline: eval: 31; 11/25: 37 Goal status: INITIAL  2.  Pt to demonstrate AMB tolerance >1079ft @ >0.23m/s to improve ability to access community as needed for leisure and social participation.  Baseline: 11/25: unable to tolerate  Goal status: INITIAL  3.  Pt to reports performance of and consistency with regular HEP to help with long-term pain management maintenance and avoidance of deconditioning in strength.  Baseline:  Goal status: INITIAL  4.  Pt to demonstrate self selected gait speed over 10MWT>1.52m/s to indicated improved AMB toelerance in household.  Baseline: 0.31m/s Goal status: INITIAL PLAN:  PT FREQUENCY: 2x/week  PT DURATION: 6 weeks  PLANNED INTERVENTIONS: 97110-Therapeutic exercises, 97530- Therapeutic activity, [id number]- Neuromuscular re-education, 97535- Self Care, 40981- Manual therapy, [phone]- Gait training, [phone]- Orthotic Fit/training, 97014- Electrical stimulation (unattended), [phone]- Electrical stimulation (manual), Patient/Family education, Balance training, Joint mobilization, Spinal manipulation, Spinal mobilization, and Moist heat.  PLAN FOR NEXT SESSION: FU on HEP performance, core strengthening, manual therapy   Viviann Spare, PT, DPT 07/07/2023, 7:56 AM

## 2023-07-07 ENCOUNTER — Ambulatory Visit (HOSPITAL_COMMUNITY): Payer: Medicare HMO

## 2023-07-07 ENCOUNTER — Encounter (HOSPITAL_COMMUNITY): Payer: Self-pay

## 2023-07-07 DIAGNOSIS — M5442 Lumbago with sciatica, left side: Secondary | ICD-10-CM | POA: Diagnosis not present

## 2023-07-07 DIAGNOSIS — M5441 Lumbago with sciatica, right side: Secondary | ICD-10-CM | POA: Diagnosis not present

## 2023-07-07 DIAGNOSIS — G8929 Other chronic pain: Secondary | ICD-10-CM | POA: Diagnosis not present

## 2023-07-07 DIAGNOSIS — R262 Difficulty in walking, not elsewhere classified: Secondary | ICD-10-CM

## 2023-07-09 ENCOUNTER — Encounter (HOSPITAL_COMMUNITY): Payer: Medicare HMO

## 2023-07-14 ENCOUNTER — Encounter (HOSPITAL_COMMUNITY): Payer: Self-pay

## 2023-07-14 ENCOUNTER — Ambulatory Visit (HOSPITAL_COMMUNITY): Payer: Medicare HMO | Attending: Physical Medicine and Rehabilitation

## 2023-07-14 DIAGNOSIS — M5442 Lumbago with sciatica, left side: Secondary | ICD-10-CM | POA: Diagnosis not present

## 2023-07-14 DIAGNOSIS — M5441 Lumbago with sciatica, right side: Secondary | ICD-10-CM | POA: Insufficient documentation

## 2023-07-14 DIAGNOSIS — R262 Difficulty in walking, not elsewhere classified: Secondary | ICD-10-CM | POA: Insufficient documentation

## 2023-07-14 DIAGNOSIS — G8929 Other chronic pain: Secondary | ICD-10-CM | POA: Insufficient documentation

## 2023-07-14 NOTE — Therapy (Signed)
OUTPATIENT PHYSICAL THERAPY TREATMENT  Patient Name: [name] MRN: [id number] DOB:28-Sep-1960, 62 y.o., female Today's Date: 07/14/2023  END OF SESSION:  PT End of Session - 07/14/23 0840     Visit Number 11    Number of Visits 13    Date for PT Re-Evaluation 07/18/23    Authorization Type [employer] Medicare HMO/PPO; Medicaid of [place] [employer]    Authorization Time Period 06/06/23-07/18/23    Progress Note Due on Visit 11    PT Start Time 0802    PT Stop Time 0843    PT Time Calculation (min) 41 min    Activity Tolerance Patient limited by pain;Patient tolerated treatment well    Behavior During Therapy [facility] for tasks assessed/performed               Past Medical History:  Diagnosis Date   Anemia    Anxiety    Arthritis    back, hands , knees    Chest pain 2010   due to sarcoidosis- per pt.    Chronic pain    Depression    GERD (gastroesophageal reflux disease)    Hypercholesterolemia    Hypertension    Pneumonia    sarcoidosis- in remission    Pulmonary sarcoidosis (HCC)    Treatment with Prednisone   Past Surgical History:  Procedure Laterality Date   ABDOMINAL HYSTERECTOMY     APPENDECTOMY     CHOLECYSTECTOMY     EXCISION MASS ABDOMINAL N/A 03/20/2020   Procedure: Excision of anterior abdominal wall fat necrosis;  Surgeon: Allena Napoleon, MD;  Location: [facility];  Service: Plastics;  Laterality: N/A;   HERNIA REPAIR     INCISIONAL HERNIA REPAIR N/A 07/01/2019   Procedure: OPEN INCISIONAL HERNIA REPAIR WITH MESH;  Surgeon: Kinsinger, De Blanch, MD;  Location: [facility] OR;  Service: General;  Laterality: N/A;   INCISIONAL HERNIA REPAIR N/A 02/20/2021   Procedure: LAPAROSCOPIC RECURRENT INCISIONAL HERNIA REPAIR WITH MESH;  Surgeon: Sheliah Hatch De Blanch, MD;  Location: [facility];  Service: General;  Laterality: N/A;   INSERTION OF MESH N/A 07/01/2019   Procedure: Insertion Of Mesh;  Surgeon: Kinsinger, De Blanch, MD;  Location: [facility] OR;  Service: General;   Laterality: N/A;   LAPAROSCOPIC GASTRIC SLEEVE RESECTION N/A 02/15/2019   Procedure: LAPAROSCOPIC GASTRIC SLEEVE RESECTION, UPPER ENDO, ERAS Pathway, Hiatal Hernia Repair;  Surgeon: Kinsinger, De Blanch, MD;  Location: [facility];  Service: General;  Laterality: N/A;   LAPAROSCOPY N/A 02/17/2019   Procedure: LAPAROSCOPY DIAGNOSTIC CONVERTED TO OPEN, EVACUATION OF HEMATOMA AND LIGATION OF VESSEL;  Surgeon: Kinsinger, De Blanch, MD;  Location: [facility];  Service: General;  Laterality: N/A;   LAPAROSCOPY N/A 07/01/2019   Procedure: Laparoscopy Diagnostic;  Surgeon: Sheliah Hatch De Blanch, MD;  Location: [facility] OR;  Service: General;  Laterality: N/A;   SMALL INTESTINE SURGERY     Patient Active Problem List   Diagnosis Date Noted   Dizziness 05/26/2023   Chronic pain 01/01/2023   Epigastric pain 11/14/2022   Need for immunization against influenza 05/29/2022   History of gastric bypass 02/06/2022   Current every day smoker 09/12/2021   Hypertension 07/12/2021   Vitamin D deficiency 07/12/2021   Aortic atherosclerosis (HCC) 07/12/2021   Incisional hernia 07/01/2019   Postoperative nausea and vomiting 02/24/2019   Back pain 11/19/2016   GASTROESOPHAGEAL REFLUX DISEASE 06/26/2009   SARCOIDOSIS, PULMONARY 06/23/2009    PCP: Gilmore Laroche, FNP  REFERRING PROVIDER: Resa Miner, MD (pain management)   REFERRING  DIAG: Low back pain   Rationale for Evaluation and Treatment: Rehabilitation   THERAPY DIAG:  Chronic midline low back pain with bilateral sciatica  Difficulty in walking, not elsewhere classified  ONSET DATE: Acute on chronic >5 years  SUBJECTIVE:                                                                                                                                                                                           SUBJECTIVE STATEMENT:        Pt arrived with TENS unit, reports wearing majority of time.  Continues to have high pain scale, 8.5/10 constant sharp pain  in lower back.     PERTINENT HISTORY:  [name] is a 61yoF who is referred by pain management to OPPT with chronic low back pain. Pt reports chronic pain in bilat hips, knees, ankles, is unable to say if any of these are related to her back pain. Pt previously seen by our services in 2018 with similar issue. Pt reports she is disabled, at home most days, struggles through her housework tasks (making the bed) despite severe pain, has found ways to adapt many tasks to a sitting set up (cleaning the bathroom, washing dishes). PMH: sarcoidosis with related pain in many areas.  Current pain: 9/10 Worst pain: 9.5/10 Aggravating factors: bending forward, vacuuming, sitting too long. Alleviating factors: very hot showers, daily use of icy hot; takes hydrocodone PRN.   PAIN:  Are you having pain? 8/10 (just above the gluteal cleft)   PRECAUTIONS: None  WEIGHT BEARING RESTRICTIONS: None   FALLS:  Has patient fallen in last 6 months? No  LIVING ENVIRONMENT: Lives with: husband  Stairs: lives on main level, no need to access 2nd floor on regular, no entry stairs  OCCUPATION: on disability  PLOF: modified independent  PATIENT GOALS: improved pain control, improved ability to manage homecare tasks   NEXT MD VISIT: 08/03/23  OBJECTIVE:  Note: Objective measures were completed at Evaluation unless otherwise noted.  DIAGNOSTIC FINDINGS:  Left Lumbar MRI 2018:  "IMPRESSION: Progressive degenerative disease at L3-4 where there are new bilateral facet joint effusions and a shallow disc bulge. The central canal remains open but mild to moderate bilateral foraminal narrowing has slightly worsened since the prior MRI.   Progressive spondylosis at L4-5 since the prior MRI where there is a new shallow left paracentral protrusion superimposed on a small disc bulge. Left greater than right subarticular recess narrowing is present which could irritate either descending L5 root."  PATIENT  SURVEYS:  FOTO: 31  COGNITION: Overall cognitive status: Within functional limits for tasks assessed  SENSATION: Pt reports intact  PALPATION: Deferred to later visit PRN  LUMBAR PHYSIOLOGICAL MOVEMENT:   AROM Eval (06/06/23)  Flexion (seated forward flexion) Slight maintained lordosis)  Extension deferred  Right lateral flexion deferred  Left lateral flexion deferred  Right rotation deferred  Left rotation deferred     FUNCTIONAL TESTS:  -5xSTS: 15sec hands free  - : 0.62m/s, fairly antalgic, increases pain   TODAY'S TREATMENT:                                                                                                                              DATE:  07/14/23: Nustep L3 x 5 minutes with MHP applied to lower back, average SPT 50  Supine: log rolling Deep breathing Decompression exercises 2-5 5x 5" holds Hamstring stretch 3x 30" Piriformis 90/90 2x 30" Bridges 5x 5" 2 sets  Leg press #3 plate 2 x 10, #4 plate x 10    25/95/63:  Physical therapy treatment session today consisted of completing assessment of goals and administration of testing as demonstrated and documented in flow sheet, treatment, and goals section of this note. Addition treatments may be found below.   Nustep level 3 x 6 minutes with MH applied to low back  FOTO: 37 5xSTS: 38 seconds  Deferred ambulation testing this date due to significant amount of pain on arrival compared to previous dates and would not reflect accurate progress.    Manual:  STM to lumbar paraspinals and B QL x 8 minutes Supine general stretching (hamstrings, piriformis, knee to chest, lower trunk rotation) x 10 minutes   Prone propped on elbows with repeated extension x 10  Prone on hands with repeated extension x 5   07/02/23:  Nustep level 3 x 6 minutes   Manual: Supine general LE stretching (hamstrings, knee to chest, piriformis) x 10 minutes   TE:  Leg press #3 plate 2 x 10, #4 plate x 10   Standing  paloff press 3# plate 3 x 10 each side  Seated row with #2 plate 2 x 10   87/56/43:  Nustep level 3 x 6 minutes   Manual: Supine general LE stretching (hamstrings, knee to chest, piriformis) x 15 minutes   TE:  Supine lower trunk rotation x 10 each side  Supine knee to chest with feet on red physioball x 10  Wall plank 2 x 30 seconds Seated rows at [employer] #2 plate 3 x 10  Seated chest press at [employer] #2 plate 3 x 10  32/95/18:   Nustep level 2 x 5 minutes (seat 8)  Manual: Supine general LE stretching (hamstrings, knee to chest, piriformis) x 15 minutes   TE:  Seated marching with GTB 2 x 10 each  Seated hip abduction with GTB 2 x 10 each   Standing paloff press 2 x 10 each with GTB  06/23/23: Nustep level 2 x 5 minutes (seat 8) Supine lower trunk rotation x 10 each side  Hooklying  knee fall outs 2 x 10 each side with GTB around knees  Supine marching with GTB around knees 2 x 10 each LE  Seated hamstring stretch 3 x 30 seconds each LE Seated paloff press with GTB x 10 each side   Manual: Supine general LE stretching (hamstrings, knee to chest, piriformis) x 10 minutes    PATIENT EDUCATION:  Education details: importance of self pacing for home tasks, modification of others;   -HEP activities Person educated: patient Education method: explanation Education comprehension: pt confirms  HOME EXERCISE PROGRAM: Access Code: [email or web address] URL: [email or web address] Date: 06/08/2023 Prepared by: Alvera Novel  Exercises - Supine Lower Trunk Rotation  - 1 x daily - 7 x weekly - 3 sets - 10 reps - Supine Single Knee to Chest Stretch  - 1 x daily - 7 x weekly - 3 sets - 10 reps - Supine March  - 1 x daily - 7 x weekly - 3 sets - 10 reps  06/09/23: - Supine Diaphragmatic Breathing  - 2-3 x daily - 7 x weekly - 3 sets - 10 reps - 5" hold - Seated Transversus Abdominis Bracing  - 1 x daily - 7 x weekly - 3 sets - 10 reps - Supine Transversus Abdominis  Bracing - Hands on Stomach  - 1 x daily - 7 x weekly - 3 sets - 10 reps - Hooklying Gluteal Sets  - 1 x daily - 7 x weekly - 3 sets - 10 reps - Hooklying Isometric Hip Abduction Adduction with Belt and Ball  - 1 x daily - 7 x weekly - 3 sets - 10 reps  07/14/23: decompression  Bridge   ASSESSMENT:  CLINICAL IMPRESSION:          Patient seen this date for 10th visit progress note. Patient arrives with significant pain this session compared to other sessions. Demonstrates regression in sit to stand due to current pain level. This does not reflect current level of progress made by patient, although pain has been rated high (8/10 consistently) throughout attendance of PT. Continues to be limited by back pain and core weakness. Pain seems to improve with manual therapy but not maintained after completion. Patient will continue to benefit from skilled PT services to address remaining deficits to improve QOL and reduction in pain.   Pt continues to be limited by pain, used TENS unit through session.  Reviewed log rolling mechanics for sit/supine transfer to reduce stress on lower back and deep breathing to calm CNS.  Began decompression exercises for supine tolerance and posterior chain strengthening that was tolerated well, add to HEP  OBJECTIVE IMPAIRMENTS: Abnormal gait, cardiopulmonary status limiting activity, decreased activity tolerance, decreased balance, decreased cognition, decreased knowledge of condition, decreased knowledge of use of DME, decreased mobility, difficulty walking, decreased ROM, and decreased strength  ACTIVITY LIMITATIONS: carrying, lifting, bending, sitting, standing, squatting, transfers, and bed mobility  PARTICIPATION LIMITATIONS: meal prep, cleaning, laundry, personal finances, interpersonal relationship, driving, shopping, community activity, occupation, and yard work  PERSONAL FACTORS: Age, Behavior pattern, Fitness, Past/current experiences, Profession, Sex, Social  background, Time since onset of injury/illness/exacerbation, and Transportation are also affecting patient's functional outcome.   REHAB POTENTIAL: Good  CLINICAL DECISION MAKING: Unstable/unpredictable  EVALUATION COMPLEXITY: Moderate   GOALS: Goals reviewed with patient? No  SHORT TERM GOALS: Target date: 07/20/23  Pt to report tolerance for AMB 2-3x daily up to 5 minutes to improve general wellness/fitness.  Baseline: only walks to door to car (farthest distance) as needed  Goal status: INITIAL  2.  Pt to demonstrate 584ft continuous AMB >0.27m/s to indicate ability to perform limited community distances at appropriate speeds for energy conservation.  Baseline: not going out ad lib unless necessary  Goal status: INITIAL  3.  Pt to demonstrate 5xSTS hands free <12sec without pain exacerbation.  Baseline: 15sec; 11/25: 38 seconds - significant amount of pain on arrival which is limiting completion of this test  Goal status: INITIAL  LONG TERM GOALS: Target date: 07/18/23  Pt to improve FOTO score > 50 to indicated reduced difficulty with performance of mobility for household tasks.  Baseline: eval: 31; 11/25: 37 Goal status: INITIAL  2.  Pt to demonstrate AMB tolerance >1064ft @ >0.29m/s to improve ability to access community as needed for leisure and social participation.  Baseline: 11/25: unable to tolerate  Goal status: INITIAL  3.  Pt to reports performance of and consistency with regular HEP to help with long-term pain management maintenance and avoidance of deconditioning in strength.  Baseline:  Goal status: INITIAL  4.  Pt to demonstrate self selected gait speed over 10MWT>1.96m/s to indicated improved AMB toelerance in household.  Baseline: 0.44m/s Goal status: INITIAL PLAN:  PT FREQUENCY: 2x/week  PT DURATION: 6 weeks  PLANNED INTERVENTIONS: 97110-Therapeutic exercises, 97530- Therapeutic activity, [id number]- Neuromuscular re-education, 97535- Self Care, 16109-  Manual therapy, [phone]- Gait training, [phone]- Orthotic Fit/training, 97014- Electrical stimulation (unattended), [phone]- Electrical stimulation (manual), Patient/Family education, Balance training, Joint mobilization, Spinal manipulation, Spinal mobilization, and Moist heat.  PLAN FOR NEXT SESSION: FU on HEP performance, core strengthening, manual therapy   Becky Sax, LPTA/CLT; CBIS [phone]  Juel Burrow, PTA, DPT 07/14/2023, 8:44 AM

## 2023-07-16 ENCOUNTER — Ambulatory Visit (HOSPITAL_COMMUNITY): Payer: Medicare HMO

## 2023-07-16 DIAGNOSIS — R262 Difficulty in walking, not elsewhere classified: Secondary | ICD-10-CM

## 2023-07-16 DIAGNOSIS — M5442 Lumbago with sciatica, left side: Secondary | ICD-10-CM | POA: Diagnosis not present

## 2023-07-16 DIAGNOSIS — M5441 Lumbago with sciatica, right side: Secondary | ICD-10-CM | POA: Diagnosis not present

## 2023-07-16 DIAGNOSIS — G8929 Other chronic pain: Secondary | ICD-10-CM | POA: Diagnosis not present

## 2023-07-16 NOTE — Therapy (Signed)
OUTPATIENT PHYSICAL THERAPY TREATMENT/PROGRESS NOTE/DISCHARGE VISIT PHYSICAL THERAPY DISCHARGE SUMMARY  Visits from Start of Care: 12  Current functional level related to goals / functional outcomes: See below   Remaining deficits: See below   Education / Equipment: HEP   Patient agrees to discharge. Patient goals were partially met. Patient is being discharged due to maximized rehab potential. Progress plateau   Patient Name: [name] MRN: [id number] DOB:[birth year], 62 y.o., female Today's Date: 07/16/2023  END OF SESSION:  PT End of Session - 07/16/23 0756     Visit Number 12    Number of Visits 13    Date for PT Re-Evaluation 07/18/23    Authorization Type [employer] Medicare HMO/PPO; Medicaid of [place] 2ndary    Authorization Time Period 06/06/23-07/18/23    Progress Note Due on Visit 11    PT Start Time 0756    PT Stop Time 0824    PT Time Calculation (min) 28 min    Activity Tolerance Patient limited by pain;Patient tolerated treatment well    Behavior During Therapy [facility] for tasks assessed/performed               Past Medical History:  Diagnosis Date   Anemia    Anxiety    Arthritis    back, hands , knees    Chest pain 2010   due to sarcoidosis- per pt.    Chronic pain    Depression    GERD (gastroesophageal reflux disease)    Hypercholesterolemia    Hypertension    Pneumonia    sarcoidosis- in remission    Pulmonary sarcoidosis (HCC)    Treatment with Prednisone   Past Surgical History:  Procedure Laterality Date   ABDOMINAL HYSTERECTOMY     APPENDECTOMY     CHOLECYSTECTOMY     EXCISION MASS ABDOMINAL N/A 03/20/2020   Procedure: Excision of anterior abdominal wall fat necrosis;  Surgeon: Allena Napoleon, MD;  Location: [facility];  Service: Plastics;  Laterality: N/A;   HERNIA REPAIR     INCISIONAL HERNIA REPAIR N/A 07/01/2019   Procedure: OPEN INCISIONAL HERNIA REPAIR WITH MESH;  Surgeon: Kinsinger, De Blanch, MD;  Location:  [facility] OR;  Service: General;  Laterality: N/A;   INCISIONAL HERNIA REPAIR N/A 02/20/2021   Procedure: LAPAROSCOPIC RECURRENT INCISIONAL HERNIA REPAIR WITH MESH;  Surgeon: Sheliah Hatch De Blanch, MD;  Location: [facility];  Service: General;  Laterality: N/A;   INSERTION OF MESH N/A 07/01/2019   Procedure: Insertion Of Mesh;  Surgeon: Kinsinger, De Blanch, MD;  Location[facility];  Service: General;  Laterality: N/A;   LAPAROSCOPIC GASTRIC SLEEVE RESECTION N/A 02/15/2019   Procedure: LAPAROSCOPIC GASTRIC SLEEVE RESECTION, UPPER ENDO, ERAS Pathway, Hiatal Hernia Repair;  Surgeon: Kinsinger, De Blanch, MD;  Location: [facility];  Service: General;  Laterality: N/A;   LAPAROSCOPY N/A 02/17/2019   Procedure: LAPAROSCOPY DIAGNOSTIC CONVERTED TO OPEN, EVACUATION OF HEMATOMA AND LIGATION OF VESSEL;  Surgeon: Kinsinger, De Blanch, MD;  Location: [facility];  Service: General;  Laterality: N/A;   LAPAROSCOPY N/A 07/01/2019   Procedure: Laparoscopy Diagnostic;  Surgeon: Sheliah Hatch De Blanch, MD;  Location: [facility] OR;  Service: General;  Laterality: N/A;   SMALL INTESTINE SURGERY     Patient Active Problem List   Diagnosis Date Noted   Dizziness 05/26/2023   Chronic pain 01/01/2023   Epigastric pain 11/14/2022   Need for immunization against influenza 05/29/2022   History of gastric bypass 02/06/2022   Current every day smoker 09/12/2021   Hypertension  07/12/2021   Vitamin D deficiency 07/12/2021   Aortic atherosclerosis (HCC) 07/12/2021   Incisional hernia 07/01/2019   Postoperative nausea and vomiting 02/24/2019   Back pain 11/19/2016   GASTROESOPHAGEAL REFLUX DISEASE 06/26/2009   SARCOIDOSIS, PULMONARY 06/23/2009    PCP: Gilmore Laroche, FNP  REFERRING PROVIDER: Resa Miner, MD (pain management)   REFERRING DIAG: Low back pain   Rationale for Evaluation and Treatment: Rehabilitation   THERAPY DIAG:  Chronic midline low back pain with bilateral sciatica  Difficulty in walking, not elsewhere  classified  ONSET DATE: Acute on chronic >5 years  SUBJECTIVE:                                                                                                                                                                                           SUBJECTIVE STATEMENT:        Patient reports no improvement with therapy and she is in constant pain; 8.5/10 today; only has some relief with moist heat and TENS.     PERTINENT HISTORY:  [name] is a 62yoF who is referred by pain management to OPPT with chronic low back pain. Pt reports chronic pain in bilat hips, knees, ankles, is unable to say if any of these are related to her back pain. Pt previously seen by our services in 2018 with similar issue. Pt reports she is disabled, at home most days, struggles through her housework tasks (making the bed) despite severe pain, has found ways to adapt many tasks to a sitting set up (cleaning the bathroom, washing dishes). PMH: sarcoidosis with related pain in many areas.  Current pain: 9/10 Worst pain: 9.5/10 Aggravating factors: bending forward, vacuuming, sitting too long. Alleviating factors: very hot showers, daily use of icy hot; takes hydrocodone PRN.   PAIN:  Are you having pain? 8/10 (just above the gluteal cleft)   PRECAUTIONS: None  WEIGHT BEARING RESTRICTIONS: None   FALLS:  Has patient fallen in last 6 months? No  LIVING ENVIRONMENT: Lives with: husband  Stairs: lives on main level, no need to access 2nd floor on regular, no entry stairs  OCCUPATION: on disability  PLOF: modified independent  PATIENT GOALS: improved pain control, improved ability to manage homecare tasks   NEXT MD VISIT: 08/03/23  OBJECTIVE:  Note: Objective measures were completed at Evaluation unless otherwise noted.  DIAGNOSTIC FINDINGS:  Left Lumbar MRI 2018:  "IMPRESSION: Progressive degenerative disease at L3-4 where there are new bilateral facet joint effusions and a shallow disc bulge.  The central canal remains open but mild to moderate bilateral foraminal narrowing has slightly worsened since the prior MRI.  Progressive spondylosis at L4-5 since the prior MRI where there is a new shallow left paracentral protrusion superimposed on a small disc bulge. Left greater than right subarticular recess narrowing is present which could irritate either descending L5 root."  PATIENT SURVEYS:  FOTO: 31  COGNITION: Overall cognitive status: Within functional limits for tasks assessed     SENSATION: Pt reports intact  PALPATION: Deferred to later visit PRN  LUMBAR PHYSIOLOGICAL MOVEMENT:   AROM Eval (06/06/23) 07/16/23  Flexion (seated forward flexion) Slight maintained lordosis) Fingertips to knees*  Extension deferred 15% available*  Right lateral flexion deferred To 3" above knee joint line*  Left lateral flexion deferred To 5" above knee joint line*  Right rotation deferred   Left rotation deferred      FUNCTIONAL TESTS:  -5xSTS: 15sec hands free  - : 0.77m/s, fairly antalgic, increases pain   TODAY'S TREATMENT:                                                                                                                              DATE:  07/16/23 Nustep seat 8 x 5' dynamic warm up; moist heat applied to low back Progress note FOTO 32 5 times sit to stand 26.68 using hands to assist up Lumbar AROM see above  07/14/23: Nustep L3 x 5 minutes with MHP applied to lower back, average SPT 50  Supine: log rolling Deep breathing Decompression exercises 2-5 5x 5" holds Hamstring stretch 3x 30" Piriformis 90/90 2x 30" Bridges 5x 5" 2 sets  Leg press #3 plate 2 x 10, #4 plate x 10    57/84/69:  Physical therapy treatment session today consisted of completing assessment of goals and administration of testing as demonstrated and documented in flow sheet, treatment, and goals section of this note. Addition treatments may be found below.   Nustep level 3 x 6  minutes with MH applied to low back  FOTO: 37 5xSTS: 38 seconds  Deferred ambulation testing this date due to significant amount of pain on arrival compared to previous dates and would not reflect accurate progress.    Manual:  STM to lumbar paraspinals and B QL x 8 minutes Supine general stretching (hamstrings, piriformis, knee to chest, lower trunk rotation) x 10 minutes   Prone propped on elbows with repeated extension x 10  Prone on hands with repeated extension x 5   07/02/23:  Nustep level 3 x 6 minutes   Manual: Supine general LE stretching (hamstrings, knee to chest, piriformis) x 10 minutes   TE:  Leg press #3 plate 2 x 10, #4 plate x 10   Standing paloff press 3# plate 3 x 10 each side  Seated row with #2 plate 2 x 10   62/95/28:  Nustep level 3 x 6 minutes   Manual: Supine general LE stretching (hamstrings, knee to chest, piriformis) x 15 minutes   TE:  Supine lower trunk rotation x 10 each side  Supine knee  to chest with feet on red physioball x 10  Wall plank 2 x 30 seconds Seated rows at [employer] #2 plate 3 x 10  Seated chest press at [employer] #2 plate 3 x 10  40/98/11:   Nustep level 2 x 5 minutes (seat 8)  Manual: Supine general LE stretching (hamstrings, knee to chest, piriformis) x 15 minutes   TE:  Seated marching with GTB 2 x 10 each  Seated hip abduction with GTB 2 x 10 each   Standing paloff press 2 x 10 each with GTB  06/23/23: Nustep level 2 x 5 minutes (seat 8) Supine lower trunk rotation x 10 each side  Hooklying knee fall outs 2 x 10 each side with GTB around knees  Supine marching with GTB around knees 2 x 10 each LE  Seated hamstring stretch 3 x 30 seconds each LE Seated paloff press with GTB x 10 each side   Manual: Supine general LE stretching (hamstrings, knee to chest, piriformis) x 10 minutes    PATIENT EDUCATION:  Education details: importance of self pacing for home tasks, modification of others;   -HEP  activities Person educated: patient Education method: explanation Education comprehension: pt confirms  HOME EXERCISE PROGRAM: Access Code: [email or web address] URL: [email or web address] Date: 06/08/2023 Prepared by: Alvera Novel  Exercises - Supine Lower Trunk Rotation  - 1 x daily - 7 x weekly - 3 sets - 10 reps - Supine Single Knee to Chest Stretch  - 1 x daily - 7 x weekly - 3 sets - 10 reps - Supine March  - 1 x daily - 7 x weekly - 3 sets - 10 reps  06/09/23: - Supine Diaphragmatic Breathing  - 2-3 x daily - 7 x weekly - 3 sets - 10 reps - 5" hold - Seated Transversus Abdominis Bracing  - 1 x daily - 7 x weekly - 3 sets - 10 reps - Supine Transversus Abdominis Bracing - Hands on Stomach  - 1 x daily - 7 x weekly - 3 sets - 10 reps - Hooklying Gluteal Sets  - 1 x daily - 7 x weekly - 3 sets - 10 reps - Hooklying Isometric Hip Abduction Adduction with Belt and Ball  - 1 x daily - 7 x weekly - 3 sets - 10 reps  07/14/23: decompression  Bridge   ASSESSMENT:  CLINICAL IMPRESSION:          Progress note today; patient with no subjective improvement in pain compliant since start of therapy.  Decline in FOTO score; slight improvement with sit to stand test.  Painful with all lumbar movement although lumbar extension is the worst.  Recommend discharge due to progress plateau.  Patient agreeable.    OBJECTIVE IMPAIRMENTS: Abnormal gait, cardiopulmonary status limiting activity, decreased activity tolerance, decreased balance, decreased cognition, decreased knowledge of condition, decreased knowledge of use of DME, decreased mobility, difficulty walking, decreased ROM, and decreased strength  ACTIVITY LIMITATIONS: carrying, lifting, bending, sitting, standing, squatting, transfers, and bed mobility  PARTICIPATION LIMITATIONS: meal prep, cleaning, laundry, personal finances, interpersonal relationship, driving, shopping, community activity, occupation, and yard work  PERSONAL  FACTORS: Age, Behavior pattern, Fitness, Past/current experiences, Profession, Sex, Social background, Time since onset of injury/illness/exacerbation, and Transportation are also affecting patient's functional outcome.   REHAB POTENTIAL: Good  CLINICAL DECISION MAKING: Unstable/unpredictable  EVALUATION COMPLEXITY: Moderate   GOALS: Goals reviewed with patient? No  SHORT TERM GOALS: Target date: 07/20/23  Pt to report tolerance for AMB 2-3x daily up to  5 minutes to improve general wellness/fitness.  Baseline: only walks to door to car (farthest distance) as needed Goal status: INITIAL  2.  Pt to demonstrate 566ft continuous AMB >0.8m/s to indicate ability to perform limited community distances at appropriate speeds for energy conservation.  Baseline: not going out ad lib unless necessary  Goal status: INITIAL  3.  Pt to demonstrate 5xSTS hands free <12sec without pain exacerbation.  Baseline: 15sec; 11/25: 38 seconds - significant amount of pain on arrival which is limiting completion of this test ; 07/16/23 26.68 sec using hands to assist up.  Goal status: INITIAL  LONG TERM GOALS: Target date: 07/18/23  Pt to improve FOTO score > 50 to indicated reduced difficulty with performance of mobility for household tasks.  Baseline: eval: 31; 11/25: 37; 07/16/23 32 Goal status: INITIAL  2.  Pt to demonstrate AMB tolerance >1061ft @ >0.45m/s to improve ability to access community as needed for leisure and social participation.  Baseline: 11/25: unable to tolerate  Goal status: INITIAL  3.  Pt to reports performance of and consistency with regular HEP to help with long-term pain management maintenance and avoidance of deconditioning in strength.  Baseline:  Goal status: met  4.  Pt to demonstrate self selected gait speed over 10MWT>1.49m/s to indicated improved AMB toelerance in household.  Baseline: 0.81m/s Goal status: INITIAL PLAN:  PT FREQUENCY: 2x/week  PT DURATION: 6  weeks  PLANNED INTERVENTIONS: 97110-Therapeutic exercises, 97530- Therapeutic activity, [id number]- Neuromuscular re-education, 97535- Self Care, 44034- Manual therapy, [phone]- Gait training, [phone]- Orthotic Fit/training, 97014- Electrical stimulation (unattended), [phone]- Electrical stimulation (manual), Patient/Family education, Balance training, Joint mobilization, Spinal manipulation, Spinal mobilization, and Moist heat.  PLAN FOR NEXT SESSION: discharge  8:23 AM, 07/16/23 Jacky Hartung Small Malayia Spizzirri MPT [facility] [place] [phone]

## 2023-07-17 ENCOUNTER — Other Ambulatory Visit (HOSPITAL_COMMUNITY): Payer: Self-pay

## 2023-07-17 ENCOUNTER — Other Ambulatory Visit: Payer: Self-pay

## 2023-07-17 ENCOUNTER — Telehealth: Payer: Self-pay

## 2023-07-17 ENCOUNTER — Encounter (HOSPITAL_COMMUNITY): Payer: Self-pay

## 2023-07-17 MED ORDER — AMLODIPINE BESYLATE 10 MG PO TABS
10.0000 mg | ORAL_TABLET | Freq: Every day | ORAL | 2 refills | Status: DC
Start: 2023-07-17 — End: 2024-04-26
  Filled 2023-07-17 (×2): qty 90, 90d supply, fill #0

## 2023-07-17 MED ORDER — AMLODIPINE BESYLATE 10 MG PO TABS: 10.0000 mg | ORAL_TABLET | Freq: Every day | ORAL | 2 refills | Status: DC

## 2023-07-17 NOTE — Telephone Encounter (Signed)
Refills sent

## 2023-07-17 NOTE — Telephone Encounter (Signed)
Copied from CRM [id number]. Topic: Clinical - Medication Refill >> Jul 16, 2023 10:51 AM Amy B wrote: Most Recent Primary Care Visit:  Provider: Gilmore Laroche  Department: [facility]  Visit Type: OFFICE VISIT  Date: 05/26/2023  Medication: amLODipine (NORVASC) 10 MG tablet  Has the patient contacted their pharmacy? Yes (Agent: If no, request that the patient contact the pharmacy for the refill. If patient does not wish to contact the pharmacy document the reason why and proceed with request.) (Agent: If yes, when and what did the pharmacy advise?)  Is this the correct pharmacy for this prescription? Yes If no, delete pharmacy and type the correct one.  This is the patient's preferred pharmacy:  [facility] - [place], [place] - [address] [place] [address] Phone: [phone] Fax: [phone]  [facility] [address] [place] [address] Phone: [phone] Fax: [phone]   Has the prescription been filled recently? No  Is the patient out of the medication? Yes  Has the patient been seen for an appointment in the last year OR does the patient have an upcoming appointment? Yes  Can we respond through MyChart? Yes  Agent: Please be advised that Rx refills may take up to 3 business days. We ask that you follow-up with your pharmacy.

## 2023-08-01 DIAGNOSIS — G8929 Other chronic pain: Secondary | ICD-10-CM | POA: Diagnosis not present

## 2023-08-01 DIAGNOSIS — M25562 Pain in left knee: Secondary | ICD-10-CM | POA: Diagnosis not present

## 2023-08-01 DIAGNOSIS — M545 Low back pain, unspecified: Secondary | ICD-10-CM | POA: Diagnosis not present

## 2023-08-01 DIAGNOSIS — M25561 Pain in right knee: Secondary | ICD-10-CM | POA: Diagnosis not present

## 2023-08-01 DIAGNOSIS — Z79899 Other long term (current) drug therapy: Secondary | ICD-10-CM | POA: Diagnosis not present

## 2023-08-01 DIAGNOSIS — Z6836 Body mass index (BMI) 36.0-36.9, adult: Secondary | ICD-10-CM | POA: Diagnosis not present

## 2023-08-17 ENCOUNTER — Other Ambulatory Visit: Payer: Self-pay | Admitting: Family Medicine

## 2023-08-17 DIAGNOSIS — I10 Essential (primary) hypertension: Secondary | ICD-10-CM

## 2023-08-26 ENCOUNTER — Other Ambulatory Visit: Payer: Self-pay

## 2023-08-26 ENCOUNTER — Emergency Department (HOSPITAL_COMMUNITY)
Admission: EM | Admit: 2023-08-26 | Discharge: 2023-08-26 | Disposition: A | Discharge status: Home / Self Care | Payer: Medicare HMO | Attending: Emergency Medicine | Admitting: Emergency Medicine

## 2023-08-26 ENCOUNTER — Emergency Department (HOSPITAL_COMMUNITY): Payer: Medicare HMO

## 2023-08-26 ENCOUNTER — Encounter (HOSPITAL_COMMUNITY): Payer: Self-pay

## 2023-08-26 DIAGNOSIS — Z79899 Other long term (current) drug therapy: Secondary | ICD-10-CM | POA: Diagnosis not present

## 2023-08-26 DIAGNOSIS — R1010 Upper abdominal pain, unspecified: Secondary | ICD-10-CM | POA: Diagnosis not present

## 2023-08-26 DIAGNOSIS — I10 Essential (primary) hypertension: Secondary | ICD-10-CM | POA: Diagnosis not present

## 2023-08-26 DIAGNOSIS — R1013 Epigastric pain: Secondary | ICD-10-CM | POA: Insufficient documentation

## 2023-08-26 DIAGNOSIS — N200 Calculus of kidney: Secondary | ICD-10-CM | POA: Diagnosis not present

## 2023-08-26 DIAGNOSIS — N281 Cyst of kidney, acquired: Secondary | ICD-10-CM | POA: Diagnosis not present

## 2023-08-26 DIAGNOSIS — R1032 Left lower quadrant pain: Secondary | ICD-10-CM | POA: Diagnosis present

## 2023-08-26 DIAGNOSIS — R1012 Left upper quadrant pain: Secondary | ICD-10-CM | POA: Diagnosis not present

## 2023-08-26 LAB — CBC WITH DIFFERENTIAL/PLATELET
Abs Immature Granulocytes: 0.02 10*3/uL (ref 0.00–0.07)
Basophils Absolute: 0 10*3/uL (ref 0.0–0.1)
Basophils Relative: 0 %
Eosinophils Absolute: 0.1 10*3/uL (ref 0.0–0.5)
Eosinophils Relative: 1 %
HCT: 37.8 % (ref 36.0–46.0)
Hemoglobin: 13.1 g/dL (ref 12.0–15.0)
Immature Granulocytes: 0 %
Lymphocytes Relative: 15 %
Lymphs Abs: 1.2 10*3/uL (ref 0.7–4.0)
MCH: 31.3 pg (ref 26.0–34.0)
MCHC: 34.7 g/dL (ref 30.0–36.0)
MCV: 90.4 fL (ref 80.0–100.0)
Monocytes Absolute: 0.7 10*3/uL (ref 0.1–1.0)
Monocytes Relative: 9 %
Neutro Abs: 5.9 10*3/uL (ref 1.7–7.7)
Neutrophils Relative %: 75 %
Platelets: 347 10*3/uL (ref 150–400)
RBC: 4.18 MIL/uL (ref 3.87–5.11)
RDW: 14.6 % (ref 11.5–15.5)
WBC: 7.9 10*3/uL (ref 4.0–10.5)
nRBC: 0 % (ref 0.0–0.2)

## 2023-08-26 LAB — URINALYSIS, W/ REFLEX TO CULTURE (INFECTION SUSPECTED)
Bilirubin Urine: NEGATIVE
Glucose, UA: NEGATIVE mg/dL
Hgb urine dipstick: NEGATIVE
Ketones, ur: NEGATIVE mg/dL
Leukocytes,Ua: NEGATIVE
Nitrite: NEGATIVE
Protein, ur: NEGATIVE mg/dL
Specific Gravity, Urine: 1.015 (ref 1.005–1.030)
pH: 6 (ref 5.0–8.0)

## 2023-08-26 LAB — COMPREHENSIVE METABOLIC PANEL
ALT: 17 U/L (ref 0–44)
AST: 27 U/L (ref 15–41)
Albumin: 3.4 g/dL — ABNORMAL LOW (ref 3.5–5.0)
Alkaline Phosphatase: 85 U/L (ref 38–126)
Anion gap: 10 (ref 5–15)
BUN: 8 mg/dL (ref 8–23)
CO2: 24 mmol/L (ref 22–32)
Calcium: 9.4 mg/dL (ref 8.9–10.3)
Chloride: 104 mmol/L (ref 98–111)
Creatinine, Ser: 0.56 mg/dL (ref 0.44–1.00)
GFR, Estimated: >60 mL/min (ref >60)
Glucose, Bld: 90 mg/dL (ref 70–99)
Potassium: 3.1 mmol/L — ABNORMAL LOW (ref 3.5–5.1)
Sodium: 138 mmol/L (ref 135–145)
Total Bilirubin: 0.8 mg/dL (ref 0.0–1.2)
Total Protein: 7.9 g/dL (ref 6.5–8.1)

## 2023-08-26 LAB — LIPASE, BLOOD: Lipase: 55 U/L — ABNORMAL HIGH (ref 11–51)

## 2023-08-26 MED ORDER — SUCRALFATE 1 GM/10ML PO SUSP: 1.0000 g | Freq: Three times a day (TID) | ORAL | 0 refills | Status: DC

## 2023-08-26 MED ORDER — IOHEXOL 300 MG/ML  SOLN
100.0000 mL | Freq: Once | INTRAMUSCULAR | Status: AC | PRN
  Administered 2023-08-26: 100 mL via INTRAVENOUS

## 2023-08-26 MED ORDER — PANTOPRAZOLE SODIUM 40 MG IV SOLR
40.0000 mg | Freq: Once | INTRAVENOUS | Status: AC
  Administered 2023-08-26: 40 mg via INTRAVENOUS
  Filled 2023-08-26: qty 10

## 2023-08-26 MED ORDER — HYDROMORPHONE HCL 1 MG/ML IJ SOLN
1.0000 mg | Freq: Once | INTRAMUSCULAR | Status: AC
  Administered 2023-08-26: 1 mg via INTRAVENOUS
  Filled 2023-08-26: qty 1

## 2023-08-26 MED ORDER — HYDROMORPHONE HCL 1 MG/ML IJ SOLN
0.5000 mg | Freq: Once | INTRAMUSCULAR | Status: AC
  Administered 2023-08-26: 0.5 mg via INTRAVENOUS
  Filled 2023-08-26: qty 0.5

## 2023-08-26 MED ORDER — PANTOPRAZOLE SODIUM 40 MG PO TBEC: 40.0000 mg | DELAYED_RELEASE_TABLET | Freq: Two times a day (BID) | ORAL | 0 refills | Status: DC

## 2023-08-26 MED ORDER — POTASSIUM CHLORIDE CRYS ER 20 MEQ PO TBCR
40.0000 meq | EXTENDED_RELEASE_TABLET | Freq: Once | ORAL | Status: AC
Start: 2023-08-26 — End: 2023-08-26
  Administered 2023-08-26: 40 meq via ORAL
  Filled 2023-08-26: qty 2

## 2023-08-26 NOTE — ED Triage Notes (Signed)
 Pt arrived via POV from home c/o on-going abdominal pain X 2 weeks. Pt reports she has tried OTC medications w/o relief. Pt endorses acid reflux, and reports she has been unable to keep "anything down. It comes right back up."

## 2023-08-26 NOTE — Discharge Instructions (Addendum)
 Do NOT take Ibuprofen  or Aspirin or other NSAIDs. Do NOT drink alcohol . We are starting you on 2 new medication to treat gastritis, which may be causing your symptoms.  Otherwise, follow-up with the gastroenterologist you are being referred to as well as your primary care physician.  If you develop new or worsening abdominal pain, vomiting blood, bloody or black stools, or any other new/concerning symptoms then return to the ER or call 911.

## 2023-08-26 NOTE — ED Provider Notes (Signed)
 [place] EMERGENCY DEPARTMENT AT [facility] Provider Note   CSN: [id number] Arrival date & time: 08/26/23  1712     History  Chief Complaint  Patient presents with   Abdominal Pain    [name] is a 63 y.o. female.  HPI 63 year old female presents with 2 weeks of progressively worsening abdominal pain.  Is primarily epigastric and left lower quadrant, the epigastric is worse.  The pain is a sharp pain and is constant.  Nothing makes it particularly happen though antacids will temporarily help a little bit (Tums).  She had a gastric bypass a couple years ago.  She denies being on any antacid medicine at baseline.  She has had on and off vomiting, mostly when she eats though over the last day she has had vomiting without eating.  No hematemesis, melena, hematochezia.  No diarrhea or constipation.  No back or chest pain.  Home Medications Prior to Admission medications   Medication Sig Start Date End Date Taking? Authorizing Provider  pantoprazole  (PROTONIX ) 40 MG tablet Take 1 tablet (40 mg total) by mouth 2 (two) times daily. 08/26/23  Yes Freddi Hamilton, MD  sucralfate  (CARAFATE ) 1 GM/10ML suspension Take 10 mLs (1 g total) by mouth 4 (four) times daily -  with meals and at bedtime. 08/26/23  Yes Freddi Hamilton, MD  acetaminophen  (TYLENOL ) 325 MG tablet Take 2 tablets (650 mg total) by mouth every 6 (six) hours as needed. 11/09/22   Elnor Jayson LABOR, DO  amLODipine  (NORVASC ) 10 MG tablet Take 1 tablet (10 mg total) by mouth daily. 07/17/23   Zarwolo, Gloria, FNP  atorvastatin  (LIPITOR) 80 MG tablet TAKE ONE TABLET BY MOUTH ONCE DAILY. 03/21/23   Zarwolo, Gloria, FNP  Blood Pressure Monitoring (BLOOD PRESSURE KIT) KIT Check your blood pressure 3 x a week 07/12/21   Paseda, Folashade R, FNP  Cholecalciferol (VITAMIN D3) 25 MCG (1000 UT) CAPS Take 1 capsule (1,000 Units total) by mouth daily. 02/07/22   Paseda, Folashade R, FNP  dicyclomine  (BENTYL ) 20 MG tablet Take 1 tablet  (20 mg total) by mouth 2 (two) times daily. 03/01/23   Theadore Ozell HERO, MD  ezetimibe  (ZETIA ) 10 MG tablet Take 1 tablet (10 mg total) by mouth daily. 05/30/22   Zarwolo, Gloria, FNP  fenofibrate  (TRICOR ) 48 MG tablet TAKE ONE TABLET BY MOUTH ONCE DAILY. 05/08/23   Zarwolo, Gloria, FNP  fluticasone  (FLONASE ) 50 MCG/ACT nasal spray Place 2 sprays into both nostrils daily. 06/03/23   Zarwolo, Gloria, FNP  ibuprofen  (ADVIL ) 800 MG tablet Take 1 tablet (800 mg total) by mouth every 8 (eight) hours as needed. 02/23/21   Kinsinger, Herlene Righter, MD  lisinopril  (ZESTRIL ) 5 MG tablet TAKE ONE TABLET BY MOUTH ONCE DAILY. 08/18/23   Zarwolo, Gloria, FNP  Multiple Vitamins-Minerals (WOMENS 50+ MULTI VITAMIN/MIN PO) Take 1 tablet by mouth daily.    [provider]  ondansetron  (ZOFRAN -ODT) 4 MG disintegrating tablet Take 1 tablet (4 mg total) by mouth every 8 (eight) hours as needed for nausea or vomiting. 03/01/23   Theadore Ozell HERO, MD  potassium chloride  (KLOR-CON  M) 10 MEQ tablet Take 1 tablet (10 mEq total) by mouth 2 (two) times daily. 06/03/23   Zarwolo, Gloria, FNP  RESTASIS 0.05 % ophthalmic emulsion 1 drop 2 (two) times daily. 12/03/21   [provider]  traMADol  (ULTRAM ) 50 MG tablet Take 1 tablet (50 mg total) by mouth every 6 (six) hours as needed. 01/29/23   Zarwolo, Gloria, FNP  Vitamin  D, Ergocalciferol , (DRISDOL ) 1.25 MG (50000 UNIT) CAPS capsule Take 1 capsule (50,000 Units total) by mouth every 7 (seven) days. 06/03/23   Zarwolo, Gloria, FNP      Allergies    Aspirin    Review of Systems   Review of Systems  Cardiovascular:  Negative for chest pain.  Gastrointestinal:  Positive for abdominal pain and vomiting. Negative for constipation and diarrhea.  Genitourinary:  Negative for dysuria.  Musculoskeletal:  Negative for back pain.    Physical Exam Updated Vital Signs BP (!) 140/83   Pulse 73   Temp 98.7 F (37.1 C) (Oral)   Resp 17   Ht 5' 2.5 (1.588 m)   Wt 93 kg    SpO2 92%   BMI 36.90 kg/m  Physical Exam Vitals and nursing note reviewed.  Constitutional:      General: She is not in acute distress.    Appearance: She is well-developed. She is obese. She is not ill-appearing or diaphoretic.  HENT:     Head: Normocephalic and atraumatic.  Cardiovascular:     Rate and Rhythm: Normal rate and regular rhythm.     Heart sounds: Normal heart sounds.  Pulmonary:     Effort: Pulmonary effort is normal.     Breath sounds: Normal breath sounds.  Abdominal:     Palpations: Abdomen is soft.     Tenderness: There is abdominal tenderness in the epigastric area.  Skin:    General: Skin is warm and dry.  Neurological:     Mental Status: She is alert.     ED Results / Procedures / Treatments   Labs (all labs ordered are listed, but only abnormal results are displayed) Labs Reviewed  COMPREHENSIVE METABOLIC PANEL - Abnormal; Notable for the following components:      Result Value   Potassium 3.1 (*)    Albumin 3.4 (*)    All other components within normal limits  LIPASE, BLOOD - Abnormal; Notable for the following components:   Lipase 55 (*)    All other components within normal limits  URINALYSIS, W/ REFLEX TO CULTURE (INFECTION SUSPECTED) - Abnormal; Notable for the following components:   APPearance HAZY (*)    Bacteria, UA MANY (*)    All other components within normal limits  CBC WITH DIFFERENTIAL/PLATELET    EKG EKG Interpretation Date/Time:  Tuesday August 26 2023 19:43:03 EST Ventricular Rate:  69 PR Interval:  185 QRS Duration:  76 QT Interval:  417 QTC Calculation: 447 R Axis:   64  Text Interpretation: Sinus rhythm no acute ST/T changes similar to July 2024 Confirmed by Freddi Hamilton [phone]) on 08/26/2023 7:46:01 PM  Radiology CT ABDOMEN PELVIS W CONTRAST Result Date: 08/26/2023 CLINICAL DATA:  Epigastric pain EXAM: CT ABDOMEN AND PELVIS WITH CONTRAST TECHNIQUE: Multidetector CT imaging of the abdomen and pelvis was  performed using the standard protocol following bolus administration of intravenous contrast. RADIATION DOSE REDUCTION: This exam was performed according to the departmental dose-optimization program which includes automated exposure control, adjustment of the mA and/or kV according to patient size and/or use of iterative reconstruction technique. CONTRAST:  OMNIPAQUE  IOHEXOL  300 MG/ML  SOLN COMPARISON:  CT 03/01/2023, 11/19/2016, 09/22/2019 FINDINGS: Lower chest: Lung bases demonstrate mild bronchiectasis and scarring at the left lung base. Hepatobiliary: Cholecystectomy. Slightly prominent common bile duct measuring up to 11 mm likely due to postsurgical change Pancreas: Mild stranding at the head and uncinate process of the pancreas. No ductal dilatation Spleen: Normal in size without  focal abnormality. Adrenals/Urinary Tract: Adrenal glands are normal. Kidneys show no hydronephrosis. Cyst upper pole left kidney for which no imaging follow-up is recommended. Punctate nonobstructing right kidney stone. Bladder unremarkable Stomach/Bowel: Stomach shows postsurgical changes consistent with prior gastric sleeve surgery. No dilated small bowel. No acute bowel wall thickening Vascular/Lymphatic: Advanced aortic atherosclerosis. No aneurysm. No suspicious lymph nodes. Reproductive: Hysterectomy.  No adnexal mass Other: Negative for pelvic effusion or free air. Status post ventral hernia repair. Musculoskeletal: No acute or suspicious osseous abnormality. Chronic sclerosis and lucency in the left iliac bone with slight expansion, question pagetoid changes. Chronic sclerosis within the left femoral head and neck as well. IMPRESSION: 1. Mild stranding at the head and uncinate process of the pancreas suspicious for mild pancreatitis 2. Punctate nonobstructing right kidney stone. 3. Aortic atherosclerosis. Aortic Atherosclerosis (ICD10-I70.0). Electronically Signed   By: Luke Bun M.D.   On: 08/26/2023 21:34     Procedures Procedures    Medications Ordered in ED Medications  pantoprazole  (PROTONIX ) injection 40 mg (40 mg Intravenous Given 08/26/23 2014)  HYDROmorphone  (DILAUDID ) injection 0.5 mg (0.5 mg Intravenous Given 08/26/23 2014)  iohexol  (OMNIPAQUE ) 300 MG/ML solution 100 mL (100 mLs Intravenous Contrast Given 08/26/23 2047)  HYDROmorphone  (DILAUDID ) injection 1 mg (1 mg Intravenous Given 08/26/23 2143)  potassium chloride  SA (KLOR-CON  M) CR tablet 40 mEq (40 mEq Oral Given 08/26/23 2143)    ED Course/ Medical Decision Making/ A&P                                 Medical Decision Making Amount and/or Complexity of Data Reviewed Labs: ordered.    Details: Normal hemoglobin. Radiology: ordered and independent interpretation performed.    Details: No perforation ECG/medicine tests: ordered and independent interpretation performed.    Details: No ischemia  Risk Prescription drug management.   Patient was given a couple doses of pain medicines and is now feeling a lot better.  She reports that she has been taking some NSAIDs daily and I suspect that she has gastritis related to that.  CT says possible mild pancreatitis but her lipase is normal.  Either way her pain is well-controlled and I think she is stable for outpatient follow-up with GI and PCP.  She is already on hydrocodone  chronically.  I have discussed that she needs to stop NSAIDs and we will put her on Protonix  as well as Carafate .  She otherwise appears stable for discharge home with return precaution.  No signs or symptoms of bleeding.        Final Clinical Impression(s) / ED Diagnoses Final diagnoses:  Upper abdominal pain    Rx / DC Orders ED Discharge Orders          Ordered    pantoprazole  (PROTONIX ) 40 MG tablet  2 times daily        08/26/23 2307    sucralfate  (CARAFATE ) 1 GM/10ML suspension  3 times daily with meals & bedtime        08/26/23 2307              Freddi Hamilton, MD 08/26/23  2350

## 2023-08-29 ENCOUNTER — Other Ambulatory Visit: Payer: Self-pay | Admitting: Family Medicine

## 2023-08-29 DIAGNOSIS — M545 Low back pain, unspecified: Secondary | ICD-10-CM | POA: Diagnosis not present

## 2023-08-29 DIAGNOSIS — E7849 Other hyperlipidemia: Secondary | ICD-10-CM

## 2023-08-29 DIAGNOSIS — G8929 Other chronic pain: Secondary | ICD-10-CM | POA: Diagnosis not present

## 2023-08-29 DIAGNOSIS — M25561 Pain in right knee: Secondary | ICD-10-CM | POA: Diagnosis not present

## 2023-08-29 DIAGNOSIS — Z79899 Other long term (current) drug therapy: Secondary | ICD-10-CM | POA: Diagnosis not present

## 2023-08-29 DIAGNOSIS — Z6836 Body mass index (BMI) 36.0-36.9, adult: Secondary | ICD-10-CM | POA: Diagnosis not present

## 2023-08-29 DIAGNOSIS — M25562 Pain in left knee: Secondary | ICD-10-CM | POA: Diagnosis not present

## 2023-09-17 DIAGNOSIS — D860 Sarcoidosis of lung: Secondary | ICD-10-CM | POA: Diagnosis not present

## 2023-09-17 DIAGNOSIS — Z8249 Family history of ischemic heart disease and other diseases of the circulatory system: Secondary | ICD-10-CM | POA: Diagnosis not present

## 2023-09-17 DIAGNOSIS — F17210 Nicotine dependence, cigarettes, uncomplicated: Secondary | ICD-10-CM | POA: Diagnosis not present

## 2023-09-17 DIAGNOSIS — I2510 Atherosclerotic heart disease of native coronary artery without angina pectoris: Secondary | ICD-10-CM | POA: Diagnosis not present

## 2023-09-17 DIAGNOSIS — E876 Hypokalemia: Secondary | ICD-10-CM | POA: Diagnosis not present

## 2023-09-17 DIAGNOSIS — I129 Hypertensive chronic kidney disease with stage 1 through stage 4 chronic kidney disease, or unspecified chronic kidney disease: Secondary | ICD-10-CM | POA: Diagnosis not present

## 2023-09-17 DIAGNOSIS — Z008 Encounter for other general examination: Secondary | ICD-10-CM | POA: Diagnosis not present

## 2023-09-17 DIAGNOSIS — F325 Major depressive disorder, single episode, in full remission: Secondary | ICD-10-CM | POA: Diagnosis not present

## 2023-09-17 DIAGNOSIS — I700 Atherosclerosis of aorta: Secondary | ICD-10-CM | POA: Diagnosis not present

## 2023-09-17 DIAGNOSIS — E785 Hyperlipidemia, unspecified: Secondary | ICD-10-CM | POA: Diagnosis not present

## 2023-09-17 DIAGNOSIS — I739 Peripheral vascular disease, unspecified: Secondary | ICD-10-CM | POA: Diagnosis not present

## 2023-09-17 DIAGNOSIS — E039 Hypothyroidism, unspecified: Secondary | ICD-10-CM | POA: Diagnosis not present

## 2023-09-18 ENCOUNTER — Other Ambulatory Visit: Payer: Self-pay | Admitting: Family Medicine

## 2023-09-18 DIAGNOSIS — E876 Hypokalemia: Secondary | ICD-10-CM

## 2023-09-19 ENCOUNTER — Encounter (HOSPITAL_COMMUNITY): Payer: Self-pay | Admitting: *Deleted

## 2023-09-25 ENCOUNTER — Ambulatory Visit: Payer: Medicare HMO | Admitting: Family Medicine

## 2023-09-26 DIAGNOSIS — Z79899 Other long term (current) drug therapy: Secondary | ICD-10-CM | POA: Diagnosis not present

## 2023-09-26 DIAGNOSIS — M545 Low back pain, unspecified: Secondary | ICD-10-CM | POA: Diagnosis not present

## 2023-09-26 DIAGNOSIS — Z6836 Body mass index (BMI) 36.0-36.9, adult: Secondary | ICD-10-CM | POA: Diagnosis not present

## 2023-09-26 DIAGNOSIS — G8929 Other chronic pain: Secondary | ICD-10-CM | POA: Diagnosis not present

## 2023-09-26 DIAGNOSIS — M25562 Pain in left knee: Secondary | ICD-10-CM | POA: Diagnosis not present

## 2023-09-26 DIAGNOSIS — M25561 Pain in right knee: Secondary | ICD-10-CM | POA: Diagnosis not present

## 2023-10-14 ENCOUNTER — Telehealth: Payer: Self-pay | Admitting: Family Medicine

## 2023-10-14 NOTE — Telephone Encounter (Signed)
pcs  Noted  Copied Sleeved  Original in PCP box Copy front desk folder

## 2023-10-20 ENCOUNTER — Ambulatory Visit: Payer: Medicare HMO | Admitting: Family Medicine

## 2023-10-20 ENCOUNTER — Telehealth: Payer: Self-pay | Admitting: Family Medicine

## 2023-10-20 NOTE — Telephone Encounter (Signed)
 Disability form  Noted Copied Sleeved (handed to provider nurse)  Patient needs form completed, she has tried last couple of visit, but due to provider out of the office has not yet been completed. Needs form before May's appointment.  Call patient when ready.

## 2023-10-23 ENCOUNTER — Other Ambulatory Visit: Payer: Self-pay | Admitting: Family Medicine

## 2023-10-24 ENCOUNTER — Other Ambulatory Visit: Payer: Self-pay | Admitting: Family Medicine

## 2023-10-24 DIAGNOSIS — M25561 Pain in right knee: Secondary | ICD-10-CM | POA: Diagnosis not present

## 2023-10-24 DIAGNOSIS — Z6836 Body mass index (BMI) 36.0-36.9, adult: Secondary | ICD-10-CM | POA: Diagnosis not present

## 2023-10-24 DIAGNOSIS — M25562 Pain in left knee: Secondary | ICD-10-CM | POA: Diagnosis not present

## 2023-10-24 DIAGNOSIS — Z79899 Other long term (current) drug therapy: Secondary | ICD-10-CM | POA: Diagnosis not present

## 2023-10-24 DIAGNOSIS — G894 Chronic pain syndrome: Secondary | ICD-10-CM | POA: Diagnosis not present

## 2023-10-24 DIAGNOSIS — M545 Low back pain, unspecified: Secondary | ICD-10-CM | POA: Diagnosis not present

## 2023-10-27 ENCOUNTER — Ambulatory Visit (INDEPENDENT_AMBULATORY_CARE_PROVIDER_SITE_OTHER): Admitting: Family Medicine

## 2023-10-27 ENCOUNTER — Encounter: Payer: Self-pay | Admitting: Family Medicine

## 2023-10-27 VITALS — BP 119/74 | HR 77 | Ht 62.5 in | Wt 199.0 lb

## 2023-10-27 DIAGNOSIS — Z1211 Encounter for screening for malignant neoplasm of colon: Secondary | ICD-10-CM

## 2023-10-27 DIAGNOSIS — E7849 Other hyperlipidemia: Secondary | ICD-10-CM

## 2023-10-27 DIAGNOSIS — R1013 Epigastric pain: Secondary | ICD-10-CM

## 2023-10-27 MED ORDER — FENOFIBRATE 48 MG PO TABS: 48.0000 mg | ORAL_TABLET | Freq: Every day | ORAL | 0 refills | Status: AC

## 2023-10-27 MED ORDER — ATORVASTATIN CALCIUM 80 MG PO TABS: 80.0000 mg | ORAL_TABLET | Freq: Every day | ORAL | 1 refills | Status: DC

## 2023-10-27 MED ORDER — SUCRALFATE 1 GM/10ML PO SUSP: 1.0000 g | Freq: Three times a day (TID) | ORAL | 0 refills | Status: DC

## 2023-10-27 MED ORDER — DICYCLOMINE HCL 20 MG PO TABS: 20.0000 mg | ORAL_TABLET | Freq: Two times a day (BID) | ORAL | 0 refills | Status: DC

## 2023-10-27 NOTE — Patient Instructions (Addendum)
 I appreciate the opportunity to provide care to you today!    Follow up:  4 months   Please continue to follow up with your surgeon for further evaluation of your abdominal pain.  For your abdominal pain, I've refilled Bentyl and Sucralfate to resume therapy.  I recommend the following lifestyle changes:  Avoid Certain Foods and Drinks: Limit or eliminate coffee, chocolate, onions, peppermint, spicy foods, carbonated beverages, citrus fruits, tomatoes, garlic, alcohol, and fatty foods such as bacon, burgers, sausages, steak, fried foods, and dairy products.  Recommended Foods: Increase your intake of high-fiber foods, including whole grain cereals, oatmeal, brown rice, root vegetables, and non-citrus fruits. Opt for high-protein foods and healthy fats, such as avocados, olive oil, nuts, and seeds.  When to Seek Urgent Medical Attention: Severe or worsening pain Pain that spreads to the chest, back, or shoulders Black, tarry stools or vomiting blood (signs of GI bleeding) Unintentional weight loss Persistent nausea or vomiting     Please continue to a heart-healthy diet and increase your physical activities. Try to exercise for at least five days a week.    It was a pleasure to see you and I look forward to continuing to work together on your health and well-being. Please do not hesitate to call the office if you need care or have questions about your care.  In case of emergency, please visit the Emergency Department for urgent care, or contact our clinic at [phone] to schedule an appointment. We're here to help you!   Have a wonderful day and week. With Gratitude, Gilmore Laroche MSN, FNP-BC

## 2023-10-27 NOTE — Telephone Encounter (Signed)
 Patient picked up forms.

## 2023-10-27 NOTE — Progress Notes (Signed)
 Established Patient Office Visit  Subjective:  Patient ID: [name], female    DOB: 23-Nov-1960  Age: 63 y.o. MRN: [id number]  CC:  Chief Complaint  Patient presents with   Follow-up    Stabbing, burning Stomach pain w/ no appetite  Body aches w/ Extreme fatigue x 3+months Would like referral for home nurse      HPI [name] is a 63 y.o. female presents with complaints of epigastric abdominal pain.  Epigastric Abdominal Pain: The patient reports a history of gastric bypass three years ago and complains of sharp epigastric abdominal pain accompanied by vomiting after eating. She denies fever, nausea, diarrhea, hematemesis, melena, hematochezia, constipation, back pain, or chest pain. A CT scan of the abdomen performed a month ago showed mild pancreatitis, but lipase levels were normal. The patient was discharged on Protonix 40 mg twice daily and Sucralfate 1 g three times daily with meals and at bedtime. She reports that she will follow up with her surgeon, Dr. Feliciana Rossetti, next month.   Past Medical History:  Diagnosis Date   Anemia    Anxiety    Arthritis    back, hands , knees    Chest pain 2010   due to sarcoidosis- per pt.    Chronic pain    Depression    GERD (gastroesophageal reflux disease)    Hypercholesterolemia    Hypertension    Pneumonia    sarcoidosis- in remission    Pulmonary sarcoidosis (HCC)    Treatment with Prednisone    Past Surgical History:  Procedure Laterality Date   ABDOMINAL HYSTERECTOMY     APPENDECTOMY     CHOLECYSTECTOMY     EXCISION MASS ABDOMINAL N/A 03/20/2020   Procedure: Excision of anterior abdominal wall fat necrosis;  Surgeon: Allena Napoleon, MD;  Location: [facility];  Service: Plastics;  Laterality: N/A;   HERNIA REPAIR     INCISIONAL HERNIA REPAIR N/A 07/01/2019   Procedure: OPEN INCISIONAL HERNIA REPAIR WITH MESH;  Surgeon: Kinsinger, De Blanch, MD;  Location: [facility] OR;  Service: General;   Laterality: N/A;   INCISIONAL HERNIA REPAIR N/A 02/20/2021   Procedure: LAPAROSCOPIC RECURRENT INCISIONAL HERNIA REPAIR WITH MESH;  Surgeon: Sheliah Hatch De Blanch, MD;  Location: [facility] ORS;  Service: General;  Laterality: N/A;   INSERTION OF MESH N/A 07/01/2019   Procedure: Insertion Of Mesh;  Surgeon: Kinsinger, De Blanch, MD;  Location: [facility] OR;  Service: General;  Laterality: N/A;   LAPAROSCOPIC GASTRIC SLEEVE RESECTION N/A 02/15/2019   Procedure: LAPAROSCOPIC GASTRIC SLEEVE RESECTION, UPPER ENDO, ERAS Pathway, Hiatal Hernia Repair;  Surgeon: Kinsinger, De Blanch, MD;  Location: [facility];  Service: General;  Laterality: N/A;   LAPAROSCOPY N/A 02/17/2019   Procedure: LAPAROSCOPY DIAGNOSTIC CONVERTED TO OPEN, EVACUATION OF HEMATOMA AND LIGATION OF VESSEL;  Surgeon: Kinsinger, De Blanch, MD;  Location: [facility];  Service: General;  Laterality: N/A;   LAPAROSCOPY N/A 07/01/2019   Procedure: Laparoscopy Diagnostic;  Surgeon: Sheliah Hatch De Blanch, MD;  Location: [facility] OR;  Service: General;  Laterality: N/A;   SMALL INTESTINE SURGERY      Family History  Problem Relation Age of Onset   Hypertension Mother    Hyperlipidemia Mother    Lupus Father    Brain cancer Sister        2020   Heart disease Brother    Heart attack Brother    Kidney disease Maternal Grandmother    Lung cancer Neg Hx     Social History  Socioeconomic History   Marital status: Divorced    Spouse name: Not on file   Number of children: 2   Years of education: 12th   Highest education level: Some college, no degree  Occupational History   Occupation: foster parent  Tobacco Use   Smoking status: Former    Current packs/day: 0.00    Average packs/day: 0.2 packs/day for 30.0 years (6.0 ttl pk-yrs)    Types: Cigarettes    Start date: 11/1991    Quit date: 11/2021    Years since quitting: 1.9   Smokeless tobacco: Never   Tobacco comments:    Quit smoking 04/23  Vaping Use   Vaping status: Never Used  Substance and  Sexual Activity   Alcohol use: Not Currently    Comment: some   Drug use: No   Sexual activity: Yes    Birth control/protection: Surgical  Other Topics Concern   Not on file  Social History Narrative   does regular exercise. Three times a week   Social Drivers of Corporate investment banker Strain: Low Risk  (12/25/2022)   Overall Financial Resource Strain (CARDIA)    Difficulty of Paying Living Expenses: Not hard at all  Food Insecurity: No Food Insecurity (12/25/2022)   Hunger Vital Sign    Worried About Running Out of Food in the Last Year: Never true    Ran Out of Food in the Last Year: Never true  Transportation Needs: No Transportation Needs (12/25/2022)   PRAPARE - Administrator, Civil Service (Medical): No    Lack of Transportation (Non-Medical): No  Physical Activity: Insufficiently Active (12/25/2022)   Exercise Vital Sign    Days of Exercise per Week: 3 days    Minutes of Exercise per Session: 20 min  Stress: No Stress Concern Present (12/25/2022)   [employer] of Occupational Health - Occupational Stress Questionnaire    Feeling of Stress : Not at all  Social Connections: Socially Integrated (12/25/2022)   Social Connection and Isolation Panel [NHANES]    Frequency of Communication with Friends and Family: More than three times a week    Frequency of Social Gatherings with Friends and Family: More than three times a week    Attends Religious Services: More than 4 times per year    Active Member of [employer] or Organizations: Yes    Attends Engineer, structural: More than 4 times per year    Marital Status: Married  Catering manager Violence: Not At Risk (12/25/2022)   Humiliation, Afraid, Rape, and Kick questionnaire    Fear of Current or Ex-Partner: No    Emotionally Abused: No    Physically Abused: No    Sexually Abused: No    Outpatient Medications Prior to Visit  Medication Sig Dispense Refill   acetaminophen (TYLENOL) 325 MG tablet  Take 2 tablets (650 mg total) by mouth every 6 (six) hours as needed. 36 tablet 0   amLODipine (NORVASC) 10 MG tablet Take 1 tablet (10 mg total) by mouth daily. 90 tablet 2   Blood Pressure Monitoring (BLOOD PRESSURE KIT) KIT Check your blood pressure 3 x a week 1 kit 0   Cholecalciferol (VITAMIN D3) 25 MCG (1000 UT) CAPS Take 1 capsule (1,000 Units total) by mouth daily. 30 capsule 3   ezetimibe (ZETIA) 10 MG tablet TAKE ONE TABLET BY MOUTH ONCE DAILY. 90 tablet 3   fluticasone (FLONASE) 50 MCG/ACT nasal spray Place 2 sprays into both nostrils daily. 16 g  0   ibuprofen (ADVIL) 800 MG tablet Take 1 tablet (800 mg total) by mouth every 8 (eight) hours as needed. 30 tablet 0   lisinopril (ZESTRIL) 5 MG tablet TAKE ONE TABLET BY MOUTH ONCE DAILY. 90 tablet 3   Multiple Vitamins-Minerals (WOMENS 50+ MULTI VITAMIN/MIN PO) Take 1 tablet by mouth daily.     ondansetron (ZOFRAN-ODT) 4 MG disintegrating tablet Take 1 tablet (4 mg total) by mouth every 8 (eight) hours as needed for nausea or vomiting. 20 tablet 0   pantoprazole (PROTONIX) 40 MG tablet Take 1 tablet (40 mg total) by mouth 2 (two) times daily. 60 tablet 0   potassium chloride (KLOR-CON) 10 MEQ tablet Take 1 tablet (10 mEq total) by mouth 2 (two) times daily. 60 tablet 1   RESTASIS 0.05 % ophthalmic emulsion 1 drop 2 (two) times daily.     traMADol (ULTRAM) 50 MG tablet Take 1 tablet (50 mg total) by mouth every 6 (six) hours as needed. 20 tablet 0   Vitamin D, Ergocalciferol, (DRISDOL) 1.25 MG (50000 UNIT) CAPS capsule Take 1 capsule (50,000 Units total) by mouth every 7 (seven) days. 20 capsule 1   atorvastatin (LIPITOR) 80 MG tablet TAKE ONE TABLET BY MOUTH ONCE DAILY. 90 tablet 0   dicyclomine (BENTYL) 20 MG tablet Take 1 tablet (20 mg total) by mouth 2 (two) times daily. 20 tablet 0   fenofibrate (TRICOR) 48 MG tablet TAKE ONE TABLET BY MOUTH ONCE DAILY. 90 tablet 0   sucralfate (CARAFATE) 1 GM/10ML suspension Take 10 mLs (1 g total) by  mouth 4 (four) times daily -  with meals and at bedtime. 420 mL 0   No facility-administered medications prior to visit.    Allergies  Allergen Reactions   Aspirin Hives    ROS Review of Systems  Constitutional:  Negative for chills and fever.  Eyes:  Negative for visual disturbance.  Respiratory:  Negative for chest tightness and shortness of breath.   Gastrointestinal:  Positive for abdominal pain. Negative for blood in stool, constipation, diarrhea and nausea.  Neurological:  Negative for dizziness and headaches.      Objective:    Physical Exam HENT:     Head: Normocephalic.     Mouth/Throat:     Mouth: Mucous membranes are moist.  Cardiovascular:     Rate and Rhythm: Normal rate.     Heart sounds: Normal heart sounds.  Pulmonary:     Effort: Pulmonary effort is normal.     Breath sounds: Normal breath sounds.  Abdominal:     General: There is no distension.     Palpations: There is no mass.     Tenderness: There is abdominal tenderness. There is no right CVA tenderness, left CVA tenderness, guarding or rebound.  Neurological:     Mental Status: She is alert.     BP 119/74   Pulse 77   Ht 5' 2.5" (1.588 m)   Wt 199 lb 0.6 oz (90.3 kg)   SpO2 91%   BMI 35.82 kg/m  Wt Readings from Last 3 Encounters:  10/27/23 199 lb 0.6 oz (90.3 kg)  08/26/23 205 lb 0.4 oz (93 kg)  05/26/23 204 lb 0.6 oz (92.6 kg)    Lab Results  Component Value Date   TSH 3.580 05/26/2023   Lab Results  Component Value Date   WBC 7.9 08/26/2023   HGB 13.1 08/26/2023   HCT 37.8 08/26/2023   MCV 90.4 08/26/2023   PLT 347 08/26/2023  Lab Results  Component Value Date   NA 138 08/26/2023   K 3.1 (L) 08/26/2023   CO2 24 08/26/2023   GLUCOSE 90 08/26/2023   BUN 8 08/26/2023   CREATININE 0.56 08/26/2023   BILITOT 0.8 08/26/2023   ALKPHOS 85 08/26/2023   AST 27 08/26/2023   ALT 17 08/26/2023   PROT 7.9 08/26/2023   ALBUMIN 3.4 (L) 08/26/2023   CALCIUM 9.4 08/26/2023    ANIONGAP 10 08/26/2023   EGFR 102 05/26/2023   Lab Results  Component Value Date   CHOL 171 05/26/2023   Lab Results  Component Value Date   HDL 34 (L) 05/26/2023   Lab Results  Component Value Date   LDLCALC 101 (H) 05/26/2023   Lab Results  Component Value Date   TRIG 205 (H) 05/26/2023   Lab Results  Component Value Date   CHOLHDL 5.0 (H) 05/26/2023   Lab Results  Component Value Date   HGBA1C 5.3 05/26/2023      Assessment & Plan:  Epigastric pain Assessment & Plan: Encouraged  to follow up with her surgeon for further evaluation of your abdominal pain. For your abdominal pain, I've refilled Bentyl and Sucralfate to resume therapy. I recommend the following lifestyle changes: Avoid Certain Foods and Drinks: Limit or eliminate coffee, chocolate, onions, peppermint, spicy foods, carbonated beverages, citrus fruits, tomatoes, garlic, alcohol, and fatty foods such as bacon, burgers, sausages, steak, fried foods, and dairy products.  Recommended Foods: Increase your intake of high-fiber foods, including whole grain cereals, oatmeal, brown rice, root vegetables, and non-citrus fruits. Opt for high-protein foods and healthy fats, such as avocados, olive oil, nuts, and seeds.  Encouraged to seek urgent medical attention: Severe or worsening pain Pain that spreads to the chest, back, or shoulders Black, tarry stools or vomiting blood (signs of GI bleeding) Unintentional weight loss Persistent nausea or vomiting   Orders: -     Dicyclomine HCl; Take 1 tablet (20 mg total) by mouth 2 (two) times daily.  Dispense: 20 tablet; Refill: 0 -     Sucralfate; Take 10 mLs (1 g total) by mouth 4 (four) times daily -  with meals and at bedtime.  Dispense: 420 mL; Refill: 0  Other hyperlipidemia -     Atorvastatin Calcium; Take 1 tablet (80 mg total) by mouth daily.  Dispense: 90 tablet; Refill: 1 -     Fenofibrate; Take 1 tablet (48 mg total) by mouth daily.  Dispense: 90 tablet;  Refill: 0  Colon cancer screening -     Cologuard   Note: This chart has been completed using Engineer, civil (consulting) software, and while attempts have been made to ensure accuracy, certain words and phrases may not be transcribed as intended.   Follow-up: Return in about 4 months (around 02/26/2024).   Gilmore Laroche, FNP

## 2023-10-27 NOTE — Assessment & Plan Note (Signed)
 Encouraged  to follow up with her surgeon for further evaluation of your abdominal pain. For your abdominal pain, I've refilled Bentyl and Sucralfate to resume therapy. I recommend the following lifestyle changes: Avoid Certain Foods and Drinks: Limit or eliminate coffee, chocolate, onions, peppermint, spicy foods, carbonated beverages, citrus fruits, tomatoes, garlic, alcohol, and fatty foods such as bacon, burgers, sausages, steak, fried foods, and dairy products.  Recommended Foods: Increase your intake of high-fiber foods, including whole grain cereals, oatmeal, brown rice, root vegetables, and non-citrus fruits. Opt for high-protein foods and healthy fats, such as avocados, olive oil, nuts, and seeds.  Encouraged to seek urgent medical attention: Severe or worsening pain Pain that spreads to the chest, back, or shoulders Black, tarry stools or vomiting blood (signs of GI bleeding) Unintentional weight loss Persistent nausea or vomiting

## 2023-10-28 ENCOUNTER — Telehealth: Payer: Self-pay | Admitting: Family Medicine

## 2023-10-28 NOTE — Telephone Encounter (Signed)
PCS  Noted  Copied Sleeved  Original in PCP box Copy front desk folder

## 2023-10-28 NOTE — Telephone Encounter (Signed)
 Called forms were done incomplete, needs to be updated. Asking for a call back at [phone] [facility] with home care.

## 2023-10-30 NOTE — Telephone Encounter (Signed)
 Copied from CRM [id number]. Topic: General - Other >> Oct 28, 2023  8:21 AM Whitney O wrote: Reason for ONG:[id number] from [facility] faxed over a form dhb [id number] form and it was faxed back but patient was not seen within the 90 days. Patient was just seen  and needing the form to be updated and faxed back . Faxed form back last week . Spoke with cal and they are sending a message to the nurse to give [name] a call back  [phone]

## 2023-10-31 ENCOUNTER — Telehealth: Payer: Self-pay | Admitting: Family Medicine

## 2023-10-31 NOTE — Telephone Encounter (Signed)
 Certification of Disability   Noted Copied Sleeved (put in provider box)  Call patient when completed

## 2023-10-31 NOTE — Telephone Encounter (Signed)
 Please verify what was incomplete and re-fax the forms to be completed

## 2023-10-31 NOTE — Telephone Encounter (Signed)
 Called patient will bring back by the forms to our office to be corrected.

## 2023-11-02 ENCOUNTER — Encounter (HOSPITAL_COMMUNITY): Payer: Self-pay

## 2023-11-02 ENCOUNTER — Emergency Department (HOSPITAL_COMMUNITY)
Admission: EM | Admit: 2023-11-02 | Discharge: 2023-11-02 | Disposition: A | Discharge status: Home / Self Care | Attending: Emergency Medicine | Admitting: Emergency Medicine

## 2023-11-02 ENCOUNTER — Other Ambulatory Visit: Payer: Self-pay

## 2023-11-02 ENCOUNTER — Emergency Department (HOSPITAL_COMMUNITY)

## 2023-11-02 DIAGNOSIS — Z79899 Other long term (current) drug therapy: Secondary | ICD-10-CM | POA: Insufficient documentation

## 2023-11-02 DIAGNOSIS — N200 Calculus of kidney: Secondary | ICD-10-CM | POA: Diagnosis not present

## 2023-11-02 DIAGNOSIS — R1010 Upper abdominal pain, unspecified: Secondary | ICD-10-CM | POA: Diagnosis not present

## 2023-11-02 DIAGNOSIS — R112 Nausea with vomiting, unspecified: Secondary | ICD-10-CM | POA: Insufficient documentation

## 2023-11-02 DIAGNOSIS — E876 Hypokalemia: Secondary | ICD-10-CM | POA: Insufficient documentation

## 2023-11-02 DIAGNOSIS — K760 Fatty (change of) liver, not elsewhere classified: Secondary | ICD-10-CM | POA: Diagnosis not present

## 2023-11-02 DIAGNOSIS — R109 Unspecified abdominal pain: Secondary | ICD-10-CM | POA: Diagnosis not present

## 2023-11-02 DIAGNOSIS — R1115 Cyclical vomiting syndrome unrelated to migraine: Secondary | ICD-10-CM | POA: Insufficient documentation

## 2023-11-02 LAB — LIPASE, BLOOD: Lipase: 50 U/L (ref 11–51)

## 2023-11-02 LAB — URINALYSIS, ROUTINE W REFLEX MICROSCOPIC
Bilirubin Urine: NEGATIVE
Glucose, UA: NEGATIVE mg/dL
Hgb urine dipstick: NEGATIVE
Ketones, ur: NEGATIVE mg/dL
Leukocytes,Ua: NEGATIVE
Nitrite: NEGATIVE
Protein, ur: NEGATIVE mg/dL
Specific Gravity, Urine: 1.005 (ref 1.005–1.030)
pH: 6 (ref 5.0–8.0)

## 2023-11-02 LAB — COMPREHENSIVE METABOLIC PANEL
ALT: 21 U/L (ref 0–44)
AST: 36 U/L (ref 15–41)
Albumin: 2.9 g/dL — ABNORMAL LOW (ref 3.5–5.0)
Alkaline Phosphatase: 97 U/L (ref 38–126)
Anion gap: 14 (ref 5–15)
BUN: 5 mg/dL — ABNORMAL LOW (ref 8–23)
CO2: 21 mmol/L — ABNORMAL LOW (ref 22–32)
Calcium: 8.9 mg/dL (ref 8.9–10.3)
Chloride: 101 mmol/L (ref 98–111)
Creatinine, Ser: 0.5 mg/dL (ref 0.44–1.00)
GFR, Estimated: >60 mL/min (ref >60)
Glucose, Bld: 94 mg/dL (ref 70–99)
Potassium: 2.6 mmol/L — CL (ref 3.5–5.1)
Sodium: 136 mmol/L (ref 135–145)
Total Bilirubin: 0.8 mg/dL (ref 0.0–1.2)
Total Protein: 7.7 g/dL (ref 6.5–8.1)

## 2023-11-02 LAB — RAPID URINE DRUG SCREEN, HOSP PERFORMED
Amphetamines: NOT DETECTED
Barbiturates: NOT DETECTED
Benzodiazepines: NOT DETECTED
Cocaine: NOT DETECTED
Opiates: POSITIVE — AB
Tetrahydrocannabinol: NOT DETECTED

## 2023-11-02 LAB — MAGNESIUM: Magnesium: 1.8 mg/dL (ref 1.7–2.4)

## 2023-11-02 LAB — CBC
HCT: 40.8 % (ref 36.0–46.0)
Hemoglobin: 14 g/dL (ref 12.0–15.0)
MCH: 32 pg (ref 26.0–34.0)
MCHC: 34.3 g/dL (ref 30.0–36.0)
MCV: 93.4 fL (ref 80.0–100.0)
Platelets: 366 10*3/uL (ref 150–400)
RBC: 4.37 MIL/uL (ref 3.87–5.11)
RDW: 12.7 % (ref 11.5–15.5)
WBC: 8.2 10*3/uL (ref 4.0–10.5)
nRBC: 0 % (ref 0.0–0.2)

## 2023-11-02 MED ORDER — TRAMADOL HCL 50 MG PO TABS: 50.0000 mg | ORAL_TABLET | Freq: Four times a day (QID) | ORAL | 0 refills | Status: DC | PRN

## 2023-11-02 MED ORDER — MORPHINE SULFATE (PF) 4 MG/ML IV SOLN
4.0000 mg | Freq: Once | INTRAVENOUS | Status: AC
  Administered 2023-11-02: 4 mg via INTRAVENOUS
  Filled 2023-11-02: qty 1

## 2023-11-02 MED ORDER — PANTOPRAZOLE SODIUM 40 MG IV SOLR
40.0000 mg | Freq: Once | INTRAVENOUS | Status: AC
  Administered 2023-11-02: 40 mg via INTRAVENOUS
  Filled 2023-11-02: qty 10

## 2023-11-02 MED ORDER — ONDANSETRON 8 MG PO TBDP: 8.0000 mg | ORAL_TABLET | Freq: Three times a day (TID) | ORAL | 0 refills | Status: AC | PRN

## 2023-11-02 MED ORDER — IOHEXOL 300 MG/ML  SOLN
100.0000 mL | Freq: Once | INTRAMUSCULAR | Status: AC | PRN
Start: 2023-11-02 — End: 2023-11-02
  Administered 2023-11-02: 100 mL via INTRAVENOUS

## 2023-11-02 MED ORDER — POTASSIUM CHLORIDE 10 MEQ/100ML IV SOLN
10.0000 meq | INTRAVENOUS | Status: AC
Start: 2023-11-02 — End: 2023-11-02
  Administered 2023-11-02 (×3): 10 meq via INTRAVENOUS
  Filled 2023-11-02 (×3): qty 100

## 2023-11-02 MED ORDER — LACTATED RINGERS IV BOLUS
1000.0000 mL | Freq: Once | INTRAVENOUS | Status: AC
  Administered 2023-11-02: 1000 mL via INTRAVENOUS

## 2023-11-02 MED ORDER — OXYCODONE-ACETAMINOPHEN 5-325 MG PO TABS
1.0000 | ORAL_TABLET | Freq: Once | ORAL | Status: AC
  Administered 2023-11-02: 1 via ORAL
  Filled 2023-11-02: qty 1

## 2023-11-02 MED ORDER — POTASSIUM CHLORIDE 20 MEQ PO PACK
40.0000 meq | PACK | Freq: Once | ORAL | Status: AC
  Administered 2023-11-02: 40 meq via ORAL
  Filled 2023-11-02: qty 2

## 2023-11-02 MED ORDER — HYDROMORPHONE HCL 1 MG/ML IJ SOLN
1.0000 mg | Freq: Once | INTRAMUSCULAR | Status: AC
  Administered 2023-11-02: 1 mg via INTRAVENOUS
  Filled 2023-11-02: qty 1

## 2023-11-02 MED ORDER — POTASSIUM CHLORIDE CRYS ER 20 MEQ PO TBCR: EXTENDED_RELEASE_TABLET | ORAL | 0 refills | Status: DC

## 2023-11-02 MED ORDER — ONDANSETRON HCL 4 MG/2ML IJ SOLN
4.0000 mg | Freq: Once | INTRAMUSCULAR | Status: AC
  Administered 2023-11-02: 4 mg via INTRAVENOUS
  Filled 2023-11-02: qty 2

## 2023-11-02 NOTE — ED Triage Notes (Signed)
 Pt c/o mid abdominal pain starting over a week ago progressively getting worse. Per pt hx of same and was diagnosed with acid reflux and given Protonix last time she was here which helped.

## 2023-11-02 NOTE — ED Provider Notes (Addendum)
 [place] EMERGENCY DEPARTMENT AT [facility] Provider Note   CSN: [id number] Arrival date & time: 11/02/23  8295     History  Chief Complaint  Patient presents with   Abdominal Pain    [name] is a 63 y.o. female.  Pt with remote hx gastric bypass, cholecystectomy, ventral hernia repair, appendectomy, c/o epigastric and mid abdominal pain in past week. Symptoms constant, dull, moderate, non radiating, without specific or consistent exacerbating or alleviating factors. No fever or chills. +nausea/vomiting, no bloody or bilious emesis. No abd distension. Having normal bms. Similar symptoms in past, not sure of cause. Indicates compliant w home meds including acid blocker therapy. Denies back or flank pain. No chestp ain or sob. No dysuria or gu c/o.   The history is provided by the patient.  Abdominal Pain Associated symptoms: nausea and vomiting   Associated symptoms: no chest pain, no chills, no constipation, no cough, no diarrhea, no dysuria, no fever, no shortness of breath and no sore throat        Home Medications Prior to Admission medications   Medication Sig Start Date End Date Taking? Authorizing Provider  acetaminophen (TYLENOL) 325 MG tablet Take 2 tablets (650 mg total) by mouth every 6 (six) hours as needed. 11/09/22   Sloan Leiter, DO  amLODipine (NORVASC) 10 MG tablet Take 1 tablet (10 mg total) by mouth daily. 07/17/23   Gilmore Laroche, FNP  atorvastatin (LIPITOR) 80 MG tablet Take 1 tablet (80 mg total) by mouth daily. 10/27/23   Gilmore Laroche, FNP  Blood Pressure Monitoring (BLOOD PRESSURE KIT) KIT Check your blood pressure 3 x a week 07/12/21   Edwin Dada R, FNP  Cholecalciferol (VITAMIN D3) 25 MCG (1000 UT) CAPS Take 1 capsule (1,000 Units total) by mouth daily. 02/07/22   Paseda, Baird Kay, FNP  dicyclomine (BENTYL) 20 MG tablet Take 1 tablet (20 mg total) by mouth 2 (two) times daily. 10/27/23   Gilmore Laroche, FNP  ezetimibe  (ZETIA) 10 MG tablet TAKE ONE TABLET BY MOUTH ONCE DAILY. 08/29/23   Gilmore Laroche, FNP  fenofibrate (TRICOR) 48 MG tablet Take 1 tablet (48 mg total) by mouth daily. 10/27/23   Gilmore Laroche, FNP  fluticasone (FLONASE) 50 MCG/ACT nasal spray Place 2 sprays into both nostrils daily. 06/03/23   Gilmore Laroche, FNP  ibuprofen (ADVIL) 800 MG tablet Take 1 tablet (800 mg total) by mouth every 8 (eight) hours as needed. 02/23/21   Kinsinger, De Blanch, MD  lisinopril (ZESTRIL) 5 MG tablet TAKE ONE TABLET BY MOUTH ONCE DAILY. 08/18/23   Gilmore Laroche, FNP  Multiple Vitamins-Minerals (WOMENS 50+ MULTI VITAMIN/MIN PO) Take 1 tablet by mouth daily.    [provider]  ondansetron (ZOFRAN-ODT) 4 MG disintegrating tablet Take 1 tablet (4 mg total) by mouth every 8 (eight) hours as needed for nausea or vomiting. 03/01/23   Sabas Sous, MD  pantoprazole (PROTONIX) 40 MG tablet Take 1 tablet (40 mg total) by mouth 2 (two) times daily. 08/26/23   Pricilla Loveless, MD  potassium chloride (KLOR-CON) 10 MEQ tablet Take 1 tablet (10 mEq total) by mouth 2 (two) times daily. 09/18/23   Gilmore Laroche, FNP  RESTASIS 0.05 % ophthalmic emulsion 1 drop 2 (two) times daily. 12/03/21   [provider]  sucralfate (CARAFATE) 1 GM/10ML suspension Take 10 mLs (1 g total) by mouth 4 (four) times daily -  with meals and at bedtime. 10/27/23   Gilmore Laroche, FNP  traMADol Janean Sark) 50  MG tablet Take 1 tablet (50 mg total) by mouth every 6 (six) hours as needed. 01/29/23   Gilmore Laroche, FNP  Vitamin D, Ergocalciferol, (DRISDOL) 1.25 MG (50000 UNIT) CAPS capsule Take 1 capsule (50,000 Units total) by mouth every 7 (seven) days. 06/03/23   Gilmore Laroche, FNP      Allergies    Aspirin    Review of Systems   Review of Systems  Constitutional:  Negative for chills and fever.  HENT:  Negative for sore throat.   Respiratory:  Negative for cough and shortness of breath.   Cardiovascular:  Negative for chest  pain.  Gastrointestinal:  Positive for abdominal pain, nausea and vomiting. Negative for blood in stool, constipation and diarrhea.  Genitourinary:  Negative for dysuria, flank pain and pelvic pain.  Musculoskeletal:  Negative for back pain and neck pain.  Skin:  Negative for rash.  Neurological:  Negative for headaches.    Physical Exam Updated Vital Signs BP 123/65   Pulse 67   Temp 98.2 F (36.8 C) (Oral)   Resp 16   Ht 1.588 m (5' 2.5")   Wt 88.5 kg   SpO2 92%   BMI 35.10 kg/m  Physical Exam Vitals and nursing note reviewed.  Constitutional:      Appearance: Normal appearance. She is well-developed.  HENT:     Head: Atraumatic.     Nose: Nose normal.     Mouth/Throat:     Mouth: Mucous membranes are moist.  Eyes:     General: No scleral icterus.    Conjunctiva/sclera: Conjunctivae normal.  Neck:     Trachea: No tracheal deviation.  Cardiovascular:     Rate and Rhythm: Normal rate and regular rhythm.     Pulses: Normal pulses.     Heart sounds: Normal heart sounds. No murmur heard.    No friction rub. No gallop.  Pulmonary:     Effort: Pulmonary effort is normal. No respiratory distress.     Breath sounds: Normal breath sounds.  Abdominal:     General: Bowel sounds are normal. There is no distension.     Palpations: Abdomen is soft. There is no mass.     Tenderness: There is abdominal tenderness. There is no guarding or rebound.     Hernia: No hernia is present.     Comments: Epigastric and mid abdominal tenderness.   Genitourinary:    Comments: No cva tenderness.  Musculoskeletal:        General: No swelling or tenderness.     Cervical back: Normal range of motion and neck supple. No rigidity. No muscular tenderness.     Right lower leg: No edema.     Left lower leg: No edema.  Skin:    General: Skin is warm and dry.     Findings: No rash.  Neurological:     Mental Status: She is alert.     Comments: Alert, speech normal.   Psychiatric:        Mood  and Affect: Mood normal.     ED Results / Procedures / Treatments   Labs (all labs ordered are listed, but only abnormal results are displayed) Results for orders placed or performed during the hospital encounter of 11/02/23  Comprehensive metabolic panel   Collection Time: 11/02/23  8:42 AM  Result Value Ref Range   Sodium 136 135 - 145 mmol/L   Potassium 2.6 (LL) 3.5 - 5.1 mmol/L   Chloride 101 98 - 111 mmol/L   CO2  21 (L) 22 - 32 mmol/L   Glucose, Bld 94 70 - 99 mg/dL   BUN 5 (L) 8 - 23 mg/dL   Creatinine, Ser 1.61 0.44 - 1.00 mg/dL   Calcium 8.9 8.9 - 09.6 mg/dL   Total Protein 7.7 6.5 - 8.1 g/dL   Albumin 2.9 (L) 3.5 - 5.0 g/dL   AST 36 15 - 41 U/L   ALT 21 0 - 44 U/L   Alkaline Phosphatase 97 38 - 126 U/L   Total Bilirubin 0.8 0.0 - 1.2 mg/dL   GFR, Estimated >04 >54 mL/min   Anion gap 14 5 - 15  CBC   Collection Time: 11/02/23  8:42 AM  Result Value Ref Range   WBC 8.2 4.0 - 10.5 K/uL   RBC 4.37 3.87 - 5.11 MIL/uL   Hemoglobin 14.0 12.0 - 15.0 g/dL   HCT 09.8 11.9 - 14.7 %   MCV 93.4 80.0 - 100.0 fL   MCH 32.0 26.0 - 34.0 pg   MCHC 34.3 30.0 - 36.0 g/dL   RDW 82.9 56.2 - 13.0 %   Platelets 366 150 - 400 K/uL   nRBC 0.0 0.0 - 0.2 %  Lipase, blood   Collection Time: 11/02/23  8:42 AM  Result Value Ref Range   Lipase 50 11 - 51 U/L  Magnesium   Collection Time: 11/02/23  8:42 AM  Result Value Ref Range   Magnesium 1.8 1.7 - 2.4 mg/dL  Urinalysis, Routine w reflex microscopic -Urine, Clean Catch   Collection Time: 11/02/23 10:27 AM  Result Value Ref Range   Color, Urine YELLOW YELLOW   APPearance HAZY (A) CLEAR   Specific Gravity, Urine 1.005 1.005 - 1.030   pH 6.0 5.0 - 8.0   Glucose, UA NEGATIVE NEGATIVE mg/dL   Hgb urine dipstick NEGATIVE NEGATIVE   Bilirubin Urine NEGATIVE NEGATIVE   Ketones, ur NEGATIVE NEGATIVE mg/dL   Protein, ur NEGATIVE NEGATIVE mg/dL   Nitrite NEGATIVE NEGATIVE   Leukocytes,Ua NEGATIVE NEGATIVE  Rapid urine drug screen  (hospital performed)   Collection Time: 11/02/23 10:27 AM  Result Value Ref Range   Opiates POSITIVE (A) NONE DETECTED   Cocaine NONE DETECTED NONE DETECTED   Benzodiazepines NONE DETECTED NONE DETECTED   Amphetamines NONE DETECTED NONE DETECTED   Tetrahydrocannabinol NONE DETECTED NONE DETECTED   Barbiturates NONE DETECTED NONE DETECTED      EKG EKG Interpretation Date/Time:  Sunday November 02 2023 12:32:18 EDT Ventricular Rate:  69 PR Interval:  168 QRS Duration:  96 QT Interval:  430 QTC Calculation: 461 R Axis:   70  Text Interpretation: Sinus rhythm Confirmed by Cathren Laine ([id number]) on 11/02/2023 12:40:00 PM  Radiology CT ABDOMEN PELVIS W CONTRAST Result Date: 11/02/2023 CLINICAL DATA:  One-week history of progressively worsening mid abdominal pain EXAM: CT ABDOMEN AND PELVIS WITH CONTRAST TECHNIQUE: Multidetector CT imaging of the abdomen and pelvis was performed using the standard protocol following bolus administration of intravenous contrast. RADIATION DOSE REDUCTION: This exam was performed according to the departmental dose-optimization program which includes automated exposure control, adjustment of the mA and/or kV according to patient size and/or use of iterative reconstruction technique. CONTRAST:  OMNIPAQUE IOHEXOL 300 MG/ML  SOLN COMPARISON:  CT abdomen and pelvis dated 08/26/2023 FINDINGS: Lower chest: Bilateral lower lobe linear atelectasis/scarring. No pleural effusion or pneumothorax demonstrated. Partially imaged heart size is normal. Hepatobiliary: Diffuse parenchymal hypoattenuation can be seen with hepatic steatosis. No intra or extrahepatic biliary ductal dilation. Cholecystectomy. Pancreas: Mild peripancreatic  stranding about the pancreatic uncinate process and body. There is underlying mild heterogeneous hypoattenuation of the pancreatic uncinate process without discrete measurable lesion. No main pancreatic ductal dilation. Spleen: Normal in size without  focal abnormality. Adrenals/Urinary Tract: No adrenal nodules. No suspicious renal masses. No hydronephrosis. Nonobstructing punctate upper pole right renal stones. No focal bladder wall thickening. Stomach/Bowel: Postsurgical changes of sleeve gastrectomy. No evidence of bowel wall thickening, distention, or inflammatory changes. Appendix is not discretely seen. Vascular/Lymphatic: Aortic atherosclerosis. No enlarged abdominal or pelvic lymph nodes. Reproductive: No adnexal masses. Other: No free fluid, fluid collection, or free air. Musculoskeletal: No acute or abnormal lytic or blastic osseous lesions. Multilevel degenerative changes of the partially imaged thoracic and lumbar spine. Diffuse heterogeneous sclerosis of the left iliac bone and proximal femur. IMPRESSION: 1. Mild peripancreatic stranding about the pancreatic uncinate process and body, which may reflect acute interstitial pancreatitis. Differential also includes primary pancreatic neoplasm. Recommend consultation to gastroenterology. No peripancreatic fluid collection. 2. Hepatic steatosis. 3. Nonobstructing punctate upper pole right renal stones. 4. Diffuse heterogeneous sclerosis of the left iliac bone and proximal femur, which is nonspecific, however can be seen in the setting of Paget's disease. 5.  Aortic Atherosclerosis (ICD10-I70.0). Electronically Signed   By: Agustin Cree M.D.   On: 11/02/2023 11:56    Procedures Procedures    Medications Ordered in ED Medications  potassium chloride 10 mEq in 100 mL IVPB (10 mEq Intravenous New Bag/Given 11/02/23 1159)  lactated ringers bolus 1,000 mL (0 mLs Intravenous Stopped 11/02/23 1010)  ondansetron (ZOFRAN) injection 4 mg (4 mg Intravenous Given 11/02/23 0856)  morphine (PF) 4 MG/ML injection 4 mg (4 mg Intravenous Given 11/02/23 0857)  pantoprazole (PROTONIX) injection 40 mg (40 mg Intravenous Given 11/02/23 0859)  iohexol (OMNIPAQUE) 300 MG/ML solution 100 mL (100 mLs Intravenous Contrast  Given 11/02/23 1134)  HYDROmorphone (DILAUDID) injection 1 mg (1 mg Intravenous Given 11/02/23 1237)    ED Course/ Medical Decision Making/ A&P                                 Medical Decision Making Problems Addressed: Cyclic vomiting syndrome: acute illness or injury with systemic symptoms that poses a threat to life or bodily functions Hypokalemia: acute illness or injury Nausea and vomiting in adult: acute illness or injury with systemic symptoms that poses a threat to life or bodily functions Upper abdominal pain: acute illness or injury with systemic symptoms that poses a threat to life or bodily functions  Amount and/or Complexity of Data Reviewed External Data Reviewed: labs, radiology and notes. Labs: ordered. Decision-making details documented in ED Course. Radiology: ordered and independent interpretation performed. Decision-making details documented in ED Course. ECG/medicine tests: ordered and independent interpretation performed. Decision-making details documented in ED Course.  Risk Prescription drug management. Parenteral controlled substances. Decision regarding hospitalization.   Iv ns. Continuous pulse ox and cardiac monitoring. Labs ordered/sent. Imaging ordered.   Differential diagnosis includes gastritis, pud, pancreatitis, sbo, etc. Dispo decision including potential need for admission considered - will get labs and imaging and reassess.   Reviewed nursing notes and prior charts for additional history. External reports reviewed.   LR bolus. Morphine iv, zofran iv, protonix iv.   Cardiac monitor: sinus rhythm, rate 70.  Labs reviewed/interpreted by me - wbc and hct normal. Chem w low k, kcl iv x 3 runs. Kcl po. Mg normal. Ua neg for uti.  Lipase normal.  CT reviewed/interpreted by me - no sbo (radiology notes mild stranding by pancreas - has been noted on a couple prior imaging studies) - rec pcp/gi f/u.   Pt requests additional pain med. Provided.    Additional ivf.   Po fluids.   No recurrent emesis.  Abd soft nt.  Afebrile. Vitals normal.   Pt currently appears stable for ED d/c.  Pt indicates has close f/u already arranged with her surgeon in the coming 1-2 weeks.   Rec close pcp/gi f/u.  Return precautions provided.     CRITICAL CARE RE: severe hypokalemia requiring parenteral k replacement, persistent nv/dehydration/iv resuscitation,.  Performed by: Suzi Roots Total critical care time: 40 minutes Critical care time was exclusive of separately billable procedures and treating other patients. Critical care was necessary to treat or prevent imminent or life-threatening deterioration. Critical care was time spent personally by me on the following activities: development of treatment plan with patient and/or surrogate as well as nursing, discussions with consultants, evaluation of patient's response to treatment, examination of patient, obtaining history from patient or surrogate, ordering and performing treatments and interventions, ordering and review of laboratory studies, ordering and review of radiographic studies, pulse oximetry and re-evaluation of patient's condition.      Final Clinical Impression(s) / ED Diagnoses Final diagnoses:  None    Rx / DC Orders ED Discharge Orders     None          Cathren Laine, MD 11/02/23 1353

## 2023-11-02 NOTE — Discharge Instructions (Addendum)
 It was our pleasure to provide your ER care today - we hope that you feel better.  Drink plenty of fluids/stay well hydrated.  Continue protonix. Take zofran as need for nausea. Take acetaminophen as need. You may also take ultram as need for pain - no driving for the next 6 hours or when taking ultram.  From today's labs, your potassium level is low - eat plenty of fruits and vegetables, take potassium supplement as prescribed, and follow up with primary care doctor.  Your imaging (both today and previously) was read as showing: Mild peripancreatic stranding about the pancreatic uncinate process and body - discuss with your surgeon and GI doctor at follow up with them.   For recurrent abdominal pain and vomiting, follow up closely with your surgeon, as well as with GI doctor - call Monday to confirm appointments.   Return to ER if worse, new symptoms, severe or worsening or intractable pain, persistent vomiting, high fevers, or other concern.

## 2023-11-03 ENCOUNTER — Telehealth: Payer: Self-pay | Admitting: Family Medicine

## 2023-11-03 NOTE — Telephone Encounter (Signed)
 Copied from CRM [id number]. Topic: General - Other >> Nov 03, 2023  3:17 PM Carlatta H wrote: Reason for CRM: Elsie Saas needs Section 5 needs to be filled out correctly and faxed back to [phone]

## 2023-11-05 NOTE — Telephone Encounter (Signed)
 Section 5 completed, just waiting on the fax to come back so we can send it

## 2023-11-17 NOTE — Telephone Encounter (Signed)
 Patient requesting a call in regards to Certification of Disability forms. She is requesting to be sent to [facility] ph: [phone], to obtain home health care. Please call patient back asap. Thank you

## 2023-11-19 ENCOUNTER — Telehealth: Payer: Self-pay | Admitting: Family Medicine

## 2023-11-19 NOTE — Telephone Encounter (Signed)
 Certification of disability MD must complete and sign.  Copied Noted Sleeved (put in provider box)  Call patient [phone] when ready for pick up

## 2023-11-20 DIAGNOSIS — G8929 Other chronic pain: Secondary | ICD-10-CM | POA: Diagnosis not present

## 2023-11-20 DIAGNOSIS — M5451 Vertebrogenic low back pain: Secondary | ICD-10-CM | POA: Diagnosis not present

## 2023-11-20 NOTE — Telephone Encounter (Signed)
 Can you let her know her home health form has been sent back but Malachi Bonds is currently working on her disability form and someone will call her when she can pick up

## 2023-11-21 DIAGNOSIS — M545 Low back pain, unspecified: Secondary | ICD-10-CM | POA: Diagnosis not present

## 2023-11-21 DIAGNOSIS — Z6836 Body mass index (BMI) 36.0-36.9, adult: Secondary | ICD-10-CM | POA: Diagnosis not present

## 2023-11-21 DIAGNOSIS — G894 Chronic pain syndrome: Secondary | ICD-10-CM | POA: Diagnosis not present

## 2023-11-21 DIAGNOSIS — M25562 Pain in left knee: Secondary | ICD-10-CM | POA: Diagnosis not present

## 2023-11-21 DIAGNOSIS — M25561 Pain in right knee: Secondary | ICD-10-CM | POA: Diagnosis not present

## 2023-11-21 NOTE — Telephone Encounter (Signed)
 Form completed by APP and MD Contact patient for pick up

## 2023-11-21 NOTE — Telephone Encounter (Signed)
 Patient advised  Patient is concerned that an MD should be signing forms not a NP  Please advise patient.

## 2023-11-21 NOTE — Telephone Encounter (Signed)
 PATIENT ADVISED

## 2023-12-01 DIAGNOSIS — H52223 Regular astigmatism, bilateral: Secondary | ICD-10-CM | POA: Diagnosis not present

## 2023-12-03 ENCOUNTER — Telehealth: Payer: Self-pay

## 2023-12-03 NOTE — Telephone Encounter (Signed)
 Copied from CRM [id number]. Topic: Clinical - Home Health Verbal Orders >> Dec 02, 2023 10:17 AM Opal Bill wrote: Caller/Agency: Patient Dalylah Ramey Requesting [facility] Callback Number: Agency Ph# [phone] Service Requested: Skilled Nursing Frequency: Pt requesting about 3 hours a day Any new concerns about the patient? No, says the doctor is already aware of currents concerns and issues. Pt says she has already spoken to the doctor and has been waiting about a month to get services. Please follow up.

## 2023-12-08 NOTE — Telephone Encounter (Signed)
 [facility] is personal care aides, not skilled nursing. I looked into this and it looks like they are asking for more hours for her aide. A new updated form has been put for [facility] to sigh. (The last one that was faxed back was for a new request for services, not an increase in hours) so once she signs this one it will be faxed

## 2023-12-12 ENCOUNTER — Other Ambulatory Visit (HOSPITAL_COMMUNITY): Payer: Self-pay | Admitting: General Surgery

## 2023-12-12 DIAGNOSIS — Z9884 Bariatric surgery status: Secondary | ICD-10-CM | POA: Diagnosis not present

## 2023-12-12 DIAGNOSIS — K8500 Idiopathic acute pancreatitis without necrosis or infection: Secondary | ICD-10-CM | POA: Diagnosis not present

## 2023-12-12 DIAGNOSIS — E569 Vitamin deficiency, unspecified: Secondary | ICD-10-CM | POA: Diagnosis not present

## 2023-12-12 DIAGNOSIS — E876 Hypokalemia: Secondary | ICD-10-CM | POA: Diagnosis not present

## 2023-12-12 DIAGNOSIS — R5383 Other fatigue: Secondary | ICD-10-CM | POA: Diagnosis not present

## 2023-12-16 ENCOUNTER — Telehealth: Payer: Self-pay

## 2023-12-16 NOTE — Telephone Encounter (Signed)
 Copied from CRM [id number]. Topic: General - Other >> Dec 16, 2023 10:02 AM Stanly Early wrote: Reason for CRM: Raina Bunting is calling for a prior authorization form to be sent in for the patient to have a home health aid. The request was put in on 4/22 but [employer] still hasn't received anything. Fax#[phone] Phone#[phone]

## 2023-12-18 ENCOUNTER — Ambulatory Visit: Admitting: Family Medicine

## 2023-12-19 DIAGNOSIS — M545 Low back pain, unspecified: Secondary | ICD-10-CM | POA: Diagnosis not present

## 2023-12-19 DIAGNOSIS — M25561 Pain in right knee: Secondary | ICD-10-CM | POA: Diagnosis not present

## 2023-12-19 DIAGNOSIS — M25562 Pain in left knee: Secondary | ICD-10-CM | POA: Diagnosis not present

## 2023-12-19 DIAGNOSIS — G894 Chronic pain syndrome: Secondary | ICD-10-CM | POA: Diagnosis not present

## 2023-12-19 DIAGNOSIS — Z6835 Body mass index (BMI) 35.0-35.9, adult: Secondary | ICD-10-CM | POA: Diagnosis not present

## 2023-12-23 ENCOUNTER — Telehealth: Payer: Self-pay

## 2023-12-23 NOTE — Telephone Encounter (Signed)
 Copied from CRM [id number]. Topic: General - Other >> Dec 23, 2023 11:59 AM Hobson Luna F wrote: Reason for CRM: Patient is calling in because she is requesting Home Health Services and was told she had to go through her PCP.

## 2023-12-23 NOTE — Telephone Encounter (Signed)
 The home health aide form was sent back to [facility] and they process it through the medicaid, not [employer]

## 2023-12-25 NOTE — Telephone Encounter (Signed)
 See other note. Form for this was signed and faxed

## 2023-12-31 ENCOUNTER — Ambulatory Visit: Payer: Medicare HMO

## 2023-12-31 VITALS — BP 116/70 | Ht 62.5 in | Wt 184.0 lb

## 2023-12-31 DIAGNOSIS — Z7189 Other specified counseling: Secondary | ICD-10-CM

## 2023-12-31 DIAGNOSIS — Z1231 Encounter for screening mammogram for malignant neoplasm of breast: Secondary | ICD-10-CM

## 2023-12-31 DIAGNOSIS — Z789 Other specified health status: Secondary | ICD-10-CM

## 2023-12-31 DIAGNOSIS — Z0001 Encounter for general adult medical examination with abnormal findings: Secondary | ICD-10-CM

## 2023-12-31 DIAGNOSIS — Z0000 Encounter for general adult medical examination without abnormal findings: Secondary | ICD-10-CM

## 2023-12-31 NOTE — Progress Notes (Signed)
 Please attest and cosign this visit due to patients primary care provider not being in the office at the time the visit was completed.   Subjective:   [name] is a 63 y.o. who presents for a Medicare Wellness preventive visit.  As a reminder, Annual Wellness Visits don't include a physical exam, and some assessments may be limited, especially if this visit is performed virtually. We may recommend an in-person follow-up visit with your provider if needed.  Visit Complete: Virtual I connected with  [name] on 12/31/23 by a audio enabled telemedicine application and verified that I am speaking with the correct person using two identifiers.  Patient Location: Home  Provider Location: Home Office  I discussed the limitations of evaluation and management by telemedicine. The patient expressed understanding and agreed to proceed.  Vital Signs: Because this visit was a virtual/telehealth visit, some criteria may be missing or patient reported. Any vitals not documented were not able to be obtained and vitals that have been documented are patient reported.  VideoDeclined- This patient declined Librarian, academic. Therefore the visit was completed with audio only.  Persons Participating in Visit: Patient.  AWV Questionnaire: No: Patient Medicare AWV questionnaire was not completed prior to this visit.  Cardiac Risk Factors include: advanced age (>72men, >24 women);dyslipidemia;hypertension;obesity (BMI >30kg/m2)     Objective:     Today's Vitals   12/31/23 0954 12/31/23 0955  BP: 116/70   Weight: 184 lb (83.5 kg)   Height: 5' 2.5" (1.588 m)   PainSc:  8    Body mass index is 33.12 kg/m.     12/31/2023   10:03 AM 11/02/2023    8:36 AM 08/26/2023    5:56 PM 12/25/2022    9:10 AM 11/09/2022    8:30 PM 09/09/2022   12:52 AM 08/20/2022    9:20 AM  Advanced Directives  Does Patient Have a Medical Advance Directive? No No No No No No No  Would  patient like information on creating a medical advance directive? No - Patient declined No - Patient declined No - Patient declined No - Patient declined  No - Patient declined Yes (Inpatient - patient defers creating a medical advance directive at this time - Information given)    Current Medications (verified) Outpatient Encounter Medications as of 12/31/2023  Medication Sig   acetaminophen  (TYLENOL ) 325 MG tablet Take 2 tablets (650 mg total) by mouth every 6 (six) hours as needed.   amLODipine  (NORVASC ) 10 MG tablet Take 1 tablet (10 mg total) by mouth daily.   atorvastatin  (LIPITOR) 80 MG tablet Take 1 tablet (80 mg total) by mouth daily.   Blood Pressure Monitoring (BLOOD PRESSURE KIT) KIT Check your blood pressure 3 x a week   Cholecalciferol (VITAMIN D3) 25 MCG (1000 UT) CAPS Take 1 capsule (1,000 Units total) by mouth daily.   dicyclomine  (BENTYL ) 20 MG tablet Take 1 tablet (20 mg total) by mouth 2 (two) times daily.   ezetimibe  (ZETIA ) 10 MG tablet TAKE ONE TABLET BY MOUTH ONCE DAILY.   fenofibrate  (TRICOR ) 48 MG tablet Take 1 tablet (48 mg total) by mouth daily.   fluticasone  (FLONASE ) 50 MCG/ACT nasal spray Place 2 sprays into both nostrils daily.   ibuprofen  (ADVIL ) 800 MG tablet Take 1 tablet (800 mg total) by mouth every 8 (eight) hours as needed.   lisinopril  (ZESTRIL ) 5 MG tablet TAKE ONE TABLET BY MOUTH ONCE DAILY.   Multiple Vitamins-Minerals (WOMENS 50+ MULTI VITAMIN/MIN PO) Take 1  tablet by mouth daily.   ondansetron  (ZOFRAN -ODT) 8 MG disintegrating tablet Take 1 tablet (8 mg total) by mouth every 8 (eight) hours as needed for nausea or vomiting.   pantoprazole  (PROTONIX ) 40 MG tablet Take 1 tablet (40 mg total) by mouth 2 (two) times daily.   potassium chloride  (KLOR-CON ) 10 MEQ tablet Take 1 tablet (10 mEq total) by mouth 2 (two) times daily.   potassium chloride  SA (KLOR-CON  M) 20 MEQ tablet One po bid x 4 days, then one po once a day   RESTASIS 0.05 % ophthalmic emulsion  1 drop 2 (two) times daily.   sucralfate  (CARAFATE ) 1 GM/10ML suspension Take 10 mLs (1 g total) by mouth 4 (four) times daily -  with meals and at bedtime.   traMADol  (ULTRAM ) 50 MG tablet Take 1 tablet (50 mg total) by mouth every 6 (six) hours as needed.   Vitamin D , Ergocalciferol , (DRISDOL ) 1.25 MG (50000 UNIT) CAPS capsule Take 1 capsule (50,000 Units total) by mouth every 7 (seven) days.   No facility-administered encounter medications on file as of 12/31/2023.    Allergies (verified) Aspirin   History: Past Medical History:  Diagnosis Date   Anemia    Anxiety    Arthritis    back, hands , knees    Chest pain 2010   due to sarcoidosis- per pt.    Chronic pain    Depression    GERD (gastroesophageal reflux disease)    Hypercholesterolemia    Hypertension    Pneumonia    sarcoidosis- in remission    Pulmonary sarcoidosis (HCC)    Treatment with Prednisone    Past Surgical History:  Procedure Laterality Date   ABDOMINAL HYSTERECTOMY     APPENDECTOMY     CHOLECYSTECTOMY     EXCISION MASS ABDOMINAL N/A 03/20/2020   Procedure: Excision of anterior abdominal wall fat necrosis;  Surgeon: Barb Bonito, MD;  Location: [facility];  Service: Plastics;  Laterality: N/A;   HERNIA REPAIR     INCISIONAL HERNIA REPAIR N/A 07/01/2019   Procedure: OPEN INCISIONAL HERNIA REPAIR WITH MESH;  Surgeon: Kinsinger, Alphonso Aschoff, MD;  Location: [facility] OR;  Service: General;  Laterality: N/A;   INCISIONAL HERNIA REPAIR N/A 02/20/2021   Procedure: LAPAROSCOPIC RECURRENT INCISIONAL HERNIA REPAIR WITH MESH;  Surgeon: Dorrie Gaudier Alphonso Aschoff, MD;  Location: [facility];  Service: General;  Laterality: N/A;   INSERTION OF MESH N/A 07/01/2019   Procedure: Insertion Of Mesh;  Surgeon: Kinsinger, Alphonso Aschoff, MD;  Location: [facility] OR;  Service: General;  Laterality: N/A;   LAPAROSCOPIC GASTRIC SLEEVE RESECTION N/A 02/15/2019   Procedure: LAPAROSCOPIC GASTRIC SLEEVE RESECTION, UPPER ENDO, ERAS Pathway,  Hiatal Hernia Repair;  Surgeon: Kinsinger, Alphonso Aschoff, MD;  Location: [facility];  Service: General;  Laterality: N/A;   LAPAROSCOPY N/A 02/17/2019   Procedure: LAPAROSCOPY DIAGNOSTIC CONVERTED TO OPEN, EVACUATION OF HEMATOMA AND LIGATION OF VESSEL;  Surgeon: Kinsinger, Alphonso Aschoff, MD;  Location: [facility];  Service: General;  Laterality: N/A;   LAPAROSCOPY N/A 07/01/2019   Procedure: Laparoscopy Diagnostic;  Surgeon: Dorrie Gaudier Alphonso Aschoff, MD;  Location: [facility] OR;  Service: General;  Laterality: N/A;   SMALL INTESTINE SURGERY     Family History  Problem Relation Age of Onset   Hypertension Mother    Hyperlipidemia Mother    Lupus Father    Brain cancer Sister        2020   Heart disease Brother    Heart attack Brother    Kidney  disease Maternal Grandmother    Lung cancer Neg Hx    Social History   Socioeconomic History   Marital status: Divorced    Spouse name: Not on file   Number of children: 2   Years of education: 12th   Highest education level: Some college, no degree  Occupational History   Occupation: foster parent  Tobacco Use   Smoking status: Former    Current packs/day: 0.00    Average packs/day: 0.2 packs/day for 30.0 years (6.0 ttl pk-yrs)    Types: Cigarettes    Start date: 11/1991    Quit date: 11/2021    Years since quitting: 2.1   Smokeless tobacco: Never   Tobacco comments:    Quit smoking 04/23  Vaping Use   Vaping status: Never Used  Substance and Sexual Activity   Alcohol  use: Not Currently    Comment: some   Drug use: No   Sexual activity: Yes    Birth control/protection: Surgical  Other Topics Concern   Not on file  Social History Narrative   does regular exercise. Three times a week   Social Drivers of Corporate investment banker Strain: Low Risk  (12/31/2023)   Overall Financial Resource Strain (CARDIA)    Difficulty of Paying Living Expenses: Not hard at all  Food Insecurity: No Food Insecurity (12/31/2023)   Hunger Vital Sign    Worried About  Running Out of Food in the Last Year: Never true    Ran Out of Food in the Last Year: Never true  Transportation Needs: No Transportation Needs (12/31/2023)   PRAPARE - Administrator, Civil Service (Medical): No    Lack of Transportation (Non-Medical): No  Physical Activity: Insufficiently Active (12/31/2023)   Exercise Vital Sign    Days of Exercise per Week: 2 days    Minutes of Exercise per Session: 10 min  Stress: No Stress Concern Present (12/31/2023)   [employer] of Occupational Health - Occupational Stress Questionnaire    Feeling of Stress : Only a little  Social Connections: Moderately Integrated (12/31/2023)   Social Connection and Isolation Panel [NHANES]    Frequency of Communication with Friends and Family: More than three times a week    Frequency of Social Gatherings with Friends and Family: More than three times a week    Attends Religious Services: More than 4 times per year    Active Member of [employer] or Organizations: No    Attends Banker Meetings: Never    Marital Status: Married    Tobacco Counseling Counseling given: Yes Tobacco comments: Quit smoking 04/23    Clinical Intake:  Pre-visit preparation completed: Yes  Pain : 0-10 Pain Score: 8  Pain Type: Chronic pain Pain Location: Back Pain Orientation: Lower Pain Descriptors / Indicators: Constant Pain Onset: More than a month ago Pain Frequency: Constant     BMI - recorded: 33.12 Nutritional Risks: None Diabetes: No  Lab Results  Component Value Date   HGBA1C 5.3 05/26/2023   HGBA1C 5.6 10/11/2022   HGBA1C 5.4 05/29/2022     How often do you need to have someone help you when you read instructions, pamphlets, or other written materials from your doctor or pharmacy?: 1 - Never  Interpreter Needed?: No  Information entered by :: Sally Crazier CMA   Activities of Daily Living     12/31/2023    9:58 AM  In your present state of health, do you have any difficulty  performing  the following activities:  Hearing? 0  Vision? 0  Difficulty concentrating or making decisions? 0  Walking or climbing stairs? 1  Dressing or bathing? 0  Doing errands, shopping? 0  Preparing Food and eating ? N  Using the Toilet? N  In the past six months, have you accidently leaked urine? N  Do you have problems with loss of bowel control? N  Managing your Medications? N  Managing your Finances? N  Housekeeping or managing your Housekeeping? N    Patient Care Team: Zarwolo, Gloria, FNP as PCP - General (Family Medicine) Riley Cheadle Windsor Hatcher, MD (Gastroenterology) Lucendia Rusk, DO (Optometry)  Indicate any recent Medical Services you may have received from other than [facility] providers in the past year (date may be approximate).     Assessment:    This is a routine wellness examination for [name].  Hearing/Vision screen Hearing Screening - Comments:: Patient denies any hearing difficulties.   Vision Screening - Comments:: Wears rx glasses - up to date with routine eye exams  Patient sees Dr. Lucendia Rusk w/ [facility] [place] office.     Goals Addressed             This Visit's Progress    Patient Stated       Improve my health        Depression Screen     12/31/2023   10:03 AM 10/27/2023    9:02 AM 05/26/2023    9:03 AM 01/29/2023    8:05 AM 01/01/2023    9:45 AM 12/25/2022    9:11 AM 11/14/2022    8:24 AM  PHQ 2/9 Scores  PHQ - 2 Score 0 3 0 0 0 0 0  PHQ- 9 Score 0 9 0 0 1 0 4    Fall Risk     12/31/2023    9:59 AM 10/27/2023    9:02 AM 05/26/2023    9:03 AM 01/29/2023    8:05 AM 01/01/2023    9:45 AM  Fall Risk   Falls in the past year? 0 0 0 0 0  Number falls in past yr: 0 0 0 0 0  Injury with Fall? 0 0 0 0 0  Risk for fall due to : No Fall Risks No Fall Risks No Fall Risks No Fall Risks   Follow up Falls evaluation completed Falls evaluation completed Falls evaluation completed Falls evaluation completed     MEDICARE RISK AT HOME:   Medicare Risk at Home Any stairs in or around the home?: Yes If so, are there any without handrails?: Yes Home free of loose throw rugs in walkways, pet beds, electrical cords, etc?: Yes Adequate lighting in your home to reduce risk of falls?: Yes Use of a cane, walker or w/c?: No Grab bars in the bathroom?: No Shower chair or bench in shower?: No Elevated toilet seat or a handicapped toilet?: Yes  TIMED UP AND GO:  Was the test performed?  No  Cognitive Function: 6CIT completed        12/31/2023    9:59 AM 12/25/2022    9:13 AM 08/20/2022    9:23 AM 08/14/2021    8:23 AM  6CIT Screen  What Year? 0 points 0 points  0 points  What month? 0 points 0 points  0 points  What time? 0 points 0 points 0 points 0 points  Count back from 20 0 points 0 points 0 points 0 points  Months in reverse 0 points 0 points  4 points 4 points  Repeat phrase 0 points 0 points 0 points 0 points  Total Score 0 points 0 points  4 points    Immunizations Immunization History  Administered Date(s) Administered   Influenza,inj,Quad PF,6+ Mos 07/18/2019, 05/01/2021, 05/29/2022   Influenza-Unspecified 06/10/2013, 05/16/2014, 05/19/2015, 04/29/2016, 05/21/2017, 07/23/2018   Moderna SARS-COV2 Booster Vaccination 06/15/2021   Moderna Sars-Covid-2 Vaccination 11/03/2019, 11/24/2019, 12/15/2020   Pneumococcal Polysaccharide-23 06/10/2013    Screening Tests Health Maintenance  Topic Date Due   Fecal DNA (Cologuard)  Never done   Zoster Vaccines- Shingrix (1 of 2) 01/27/2024 (Originally [birth year]/24/1980)   Cervical Cancer Screening (HPV/Pap Cotest)  10/26/2024 (Originally 07/25/1991)   DTaP/Tdap/Td (1 - Tdap) 10/26/2024 (Originally 07/24/1980)   Pneumococcal Vaccine 20-77 Years old (2 of 2 - PCV) 10/26/2024 (Originally 06/10/2014)   INFLUENZA VACCINE  03/12/2024   MAMMOGRAM  04/09/2024   Medicare Annual Wellness (AWV)  12/30/2024   Hepatitis C Screening  Completed   HIV Screening  Completed   HPV VACCINES   Aged Out   Meningococcal B Vaccine  Aged Out   COVID-19 Vaccine  Discontinued    Health Maintenance  Health Maintenance Due  Topic Date Due   Fecal DNA (Cologuard)  Never done   Health Maintenance Items Addressed: Mammogram ordered VBCI referral placed to see what resources are available to assist patient in getting an aid to help with ADL's  Additional Screening:  Vision Screening: Recommended annual ophthalmology exams for early detection of glaucoma and other disorders of the eye.  Dental Screening: Recommended annual dental exams for proper oral hygiene  Community Resource Referral / Chronic Care Management: CRR required this visit?  Yes   CCM required this visit?  No   Plan:    I have personally reviewed and noted the following in the patient's chart:   Medical and social history Use of alcohol , tobacco or illicit drugs  Current medications and supplements including opioid prescriptions. Patient is currently taking opioid prescriptions. Information provided to patient regarding non-opioid alternatives. Patient advised to discuss non-opioid treatment plan with their provider. Functional ability and status Nutritional status Physical activity Advanced directives List of other physicians Hospitalizations, surgeries, and ER visits in previous 12 months Vitals Screenings to include cognitive, depression, and falls Referrals and appointments  In addition, I have reviewed and discussed with patient certain preventive protocols, quality metrics, and best practice recommendations. A written personalized care plan for preventive services as well as general preventive health recommendations were provided to patient.   Devann Cribb, CMA   12/31/2023   After Visit Summary: (MyChart) Due to this being a telephonic visit, the after visit summary with patients personalized plan was offered to patient via MyChart   Notes: Please refer to Routing Comments.

## 2023-12-31 NOTE — Patient Instructions (Signed)
 [name] , Thank you for taking time out of your busy schedule to complete your Annual Wellness Visit with me. I enjoyed our conversation and look forward to speaking with you again next year. I, as well as your care team,  appreciate your ongoing commitment to your health goals. Please review the following plan we discussed and let me know if I can assist you in the future.  Your Game plan/ To Do List     Referrals: If you haven't heard from the office you've been referred to, please reach out to them at the phone number provided.  A referral has been placed for you to check and see what additional resources are available to you.   If you haven't heard from anyone within the next 7 business days, please call them and let them know a referral has been placed for you Phone: [phone]  An order for your yearly mammogram was placed for you today Please call the number below to schedule your appt. [facility] Phone: [phone] Make sure to wear two-piece clothing.  Please remember: No lotions,powders, or deodorants the day of the appointment Make sure to bring picture ID and insurance card.  Bring list of medications you are currently taking including any supplements.   Follow up Visits:  Next Medicare AWV with our clinical staff: Jan 04, 2025 at 12:30 pm video visit    Have you seen your provider in the last 6 months (3 months if uncontrolled diabetes)? yes  Next Office Visit with your provider: February 26, 2024 at 8:40 am in office  Clinician Recommendations:    Aim for 30 minutes of exercise or brisk walking, 6-8 glasses of water, and 5 servings of fruits and vegetables each day.   I enjoyed our conversation today and look forward to talking with you again next year!! Have a wonderful and safe year. All the best, Lorilynn Lehr      This is a list of the screening recommended for you and due dates:  Health Maintenance  Topic Date Due   Cologuard (Stool DNA  test)  Never done   Zoster (Shingles) Vaccine (1 of 2) 01/27/2024*   Pap with HPV screening  10/26/2024*   DTaP/Tdap/Td vaccine (1 - Tdap) 10/26/2024*   Pneumococcal Vaccination (2 of 2 - PCV) 10/26/2024*   Flu Shot  03/12/2024   Mammogram  04/09/2024   Medicare Annual Wellness Visit  12/30/2024   Hepatitis C Screening  Completed   HIV Screening  Completed   HPV Vaccine  Aged Out   Meningitis B Vaccine  Aged Out   COVID-19 Vaccine  Discontinued  *Topic was postponed. The date shown is not the original due date.    Advanced directives: (Declined) Advance directive discussed with you today. Even though you declined this today, please call our office should you change your mind, and we can give you the proper paperwork for you to fill out. Advance Care Planning is important because it:  [x]  Makes sure you receive the medical care that is consistent with your values, goals, and preferences  [x]  It provides guidance to your family and loved ones and reduces their decisional burden about whether or not they are making the right decisions based on your wishes.  Follow the link provided in your after visit summary or read over the paperwork we have mailed to you to help you started getting your Advance Directives in place. If you need assistance in completing these,  please reach out to us  so that we can help you!  See attachments for Preventive Care and Fall Prevention Tips.   Managing Pain Without Opioids  Opioids are strong medicines used to treat moderate to severe pain. For some people, especially those who have long-term (chronic) pain, opioids may not be the best choice for pain management due to: Side effects like nausea, constipation, and sleepiness. The risk of addiction (opioid use disorder). The longer you take opioids, the greater your risk of addiction. Pain that lasts for more than 3 months is called chronic pain. Managing chronic pain usually requires more than one approach and  is often provided by a team of health care providers working together (multidisciplinary approach). Pain management may be done at a pain management center or pain clinic. How to manage pain without the use of opioids Use non-opioid medicines Non-opioid medicines for pain may include: Over-the-counter or prescription non-steroidal anti-inflammatory drugs (NSAIDs). These may be the first medicines used for pain. They work well for muscle and bone pain, and they reduce swelling. Acetaminophen . This over-the-counter medicine may work well for milder pain but not swelling. Antidepressants. These may be used to treat chronic pain. A certain type of antidepressant (tricyclics) is often used. These medicines are given in lower doses for pain than when used for depression. Anticonvulsants. These are usually used to treat seizures but may also reduce nerve (neuropathic) pain. Muscle relaxants. These relieve pain caused by sudden muscle tightening (spasms). You may also use a pain medicine that is applied to the skin as a patch, cream, or gel (topical analgesic), such as a numbing medicine. These may cause fewer side effects than medicines taken by mouth. Do certain therapies as directed Some therapies can help with pain management. They include: Physical therapy. You will do exercises to gain strength and flexibility. A physical therapist may teach you exercises to move and stretch parts of your body that are weak, stiff, or painful. You can learn these exercises at physical therapy visits and practice them at home. Physical therapy may also involve: Massage. Heat wraps or applying heat or cold to affected areas. Electrical signals that interrupt pain signals (transcutaneous electrical nerve stimulation, TENS). Weak lasers that reduce pain and swelling (low-level laser therapy). Signals from your body that help you learn to regulate pain (biofeedback). Occupational therapy. This helps you to learn ways to  function at home and work with less pain. Recreational therapy. This involves trying new activities or hobbies, such as a physical activity or drawing. Mental health therapy, including: Cognitive behavioral therapy (CBT). This helps you learn coping skills for dealing with pain. Acceptance and commitment therapy (ACT) to change the way you think and react to pain. Relaxation therapies, including muscle relaxation exercises and mindfulness-based stress reduction. Pain management counseling. This may be individual, family, or group counseling.  Receive medical treatments Medical treatments for pain management include: Nerve block injections. These may include a pain blocker and anti-inflammatory medicines. You may have injections: Near the spine to relieve chronic back or neck pain. Into joints to relieve back or joint pain. Into nerve areas that supply a painful area to relieve body pain. Into muscles (trigger point injections) to relieve some painful muscle conditions. A medical device placed near your spine to help block pain signals and relieve nerve pain or chronic back pain (spinal cord stimulation device). Acupuncture. Follow these instructions at home Medicines Take over-the-counter and prescription medicines only as told by your health care provider. If you  are taking pain medicine, ask your health care providers about possible side effects to watch out for. Do not drive or use heavy machinery while taking prescription opioid pain medicine. Lifestyle  Do not use drugs or alcohol  to reduce pain. If you drink alcohol , limit how much you have to: 0-1 drink a day for women who are not pregnant. 0-2 drinks a day for men. Know how much alcohol  is in a drink. In the [place] one drink equals one 12 oz bottle of beer (355 mL), one 5 oz glass of wine (148 mL), or one 1 oz glass of hard liquor (44 mL). Do not use any products that contain nicotine or tobacco. These products include cigarettes,  chewing tobacco, and vaping devices, such as e-cigarettes. If you need help quitting, ask your health care provider. Eat a healthy diet and maintain a healthy weight. Poor diet and excess weight may make pain worse. Eat foods that are high in fiber. These include fresh fruits and vegetables, whole grains, and beans. Limit foods that are high in fat and processed sugars, such as fried and sweet foods. Exercise regularly. Exercise lowers stress and may help relieve pain. Ask your health care provider what activities and exercises are safe for you. If your health care provider approves, join an exercise class that combines movement and stress reduction. Examples include yoga and tai chi. Get enough sleep. Lack of sleep may make pain worse. Lower stress as much as possible. Practice stress reduction techniques as told by your therapist. General instructions Work with all your pain management providers to find the treatments that work best for you. You are an important member of your pain management team. There are many things you can do to reduce pain on your own. Consider joining an online or in-person support group for people who have chronic pain. Keep all follow-up visits. This is important. Where to find more information You can find more information about managing pain without opioids from: American Academy of Pain Medicine: painmed.org Institute for Chronic Pain: instituteforchronicpain.org American Chronic Pain Association: theacpa.org Contact a health care provider if: You have side effects from pain medicine. Your pain gets worse or does not get better with treatments or home therapy. You are struggling with anxiety or depression. Summary Many types of pain can be managed without opioids. Chronic pain may respond better to pain management without opioids. Pain is best managed when you and a team of health care providers work together. Pain management without opioids may include  non-opioid medicines, medical treatments, physical therapy, mental health therapy, and lifestyle changes. Tell your health care providers if your pain gets worse or is not being managed well enough. This information is not intended to replace advice given to you by your health care provider. Make sure you discuss any questions you have with your health care provider. Document Revised: 11/08/2020 Document Reviewed: 11/08/2020 Elsevier Patient Education  2023 [employer]. Understanding Your Risk for Falls Millions of people have serious injuries from falls each year. It is important to understand your risk of falling. Talk with your health care provider about your risk and what you can do to lower it. If you do have a serious fall, make sure to tell your provider. Falling once raises your risk of falling again. How can falls affect me? Serious injuries from falls are common. These include: Broken bones, such as hip fractures. Head injuries, such as traumatic brain injuries (TBI) or concussions. A fear of falling can cause you  to avoid activities and stay at home. This can make your muscles weaker and raise your risk for a fall. What can increase my risk? There are a number of risk factors that increase your risk for falling. The more risk factors you have, the higher your risk of falling. Serious injuries from a fall happen most often to people who are older than 63 years old. Teenagers and young adults ages 54-29 are also at higher risk. Common risk factors include: Weakness in the lower body. Being generally weak or confused due to long-term (chronic) illness. Dizziness or balance problems. Poor vision. Medicines that cause dizziness or drowsiness. These may include: Medicines for your blood pressure, heart, anxiety, insomnia, or swelling (edema). Pain medicines. Muscle relaxants. Other risk factors include: Drinking alcohol . Having had a fall in the past. Having foot pain or wearing  improper footwear. Working at a dangerous job. Having any of the following in your home: Tripping hazards, such as floor clutter or loose rugs. Poor lighting. Pets. Having dementia or memory loss. What actions can I take to lower my risk of falling?     Physical activity Stay physically fit. Do strength and balance exercises. Consider taking a regular class to build strength and balance. Yoga and tai chi are good options. Vision Have your eyes checked every year and your prescription for glasses or contacts updated as needed. Shoes and walking aids Wear non-skid shoes. Wear shoes that have rubber soles and low heels. Do not wear high heels. Do not walk around the house in socks or slippers. Use a cane or walker as told by your provider. Home safety Attach secure railings on both sides of your stairs. Install grab bars for your bathtub, shower, and toilet. Use a non-skid mat in your bathtub or shower. Attach bath mats securely with double-sided, non-slip rug tape. Use good lighting in all rooms. Keep a flashlight near your bed. Make sure there is a clear path from your bed to the bathroom. Use night-lights. Do not use throw rugs. Make sure all carpeting is taped or tacked down securely. Remove all clutter from walkways and stairways, including extension cords. Repair uneven or broken steps and floors. Avoid walking on icy or slippery surfaces. Walk on the grass instead of on icy or slick sidewalks. Use ice melter to get rid of ice on walkways in the winter. Use a cordless phone. Questions to ask your health care provider Can you help me check my risk for a fall? Do any of my medicines make me more likely to fall? Should I take a vitamin D  supplement? What exercises can I do to improve my strength and balance? Should I make an appointment to have my vision checked? Do I need a bone density test to check for weak bones (osteoporosis)? Would it help to use a cane or a walker? Where  to find more information Centers for Disease Control and Prevention, STEADI: TonerPromos.no Community-Based Fall Prevention Programs: TonerPromos.no [employer] on Aging: BaseRingTones.pl Contact a health care provider if: You fall at home. You are afraid of falling at home. You feel weak, drowsy, or dizzy. This information is not intended to replace advice given to you by your health care provider. Make sure you discuss any questions you have with your health care provider. Document Revised: 04/01/2022 Document Reviewed: 04/01/2022 Elsevier Patient Education  2024 [employer].

## 2024-01-01 ENCOUNTER — Ambulatory Visit (HOSPITAL_COMMUNITY)
Admission: RE | Admit: 2024-01-01 | Discharge: 2024-01-01 | Disposition: A | Discharge status: Home / Self Care | Source: Ambulatory Visit | Attending: General Surgery | Admitting: General Surgery

## 2024-01-01 DIAGNOSIS — Z9884 Bariatric surgery status: Secondary | ICD-10-CM | POA: Diagnosis not present

## 2024-01-01 DIAGNOSIS — R1110 Vomiting, unspecified: Secondary | ICD-10-CM | POA: Diagnosis not present

## 2024-01-01 DIAGNOSIS — Z903 Acquired absence of stomach [part of]: Secondary | ICD-10-CM | POA: Diagnosis not present

## 2024-01-01 DIAGNOSIS — K224 Dyskinesia of esophagus: Secondary | ICD-10-CM | POA: Diagnosis not present

## 2024-01-02 ENCOUNTER — Telehealth: Payer: Self-pay

## 2024-01-02 NOTE — Progress Notes (Signed)
 Complex Care Management Note  Care Guide Note 01/02/2024 Name: [name] MRN: [id number] DOB: [birth year]  [name] is a 63 y.o. year old female who sees Zarwolo, Gloria, FNP for primary care. I reached out to [name] by phone today to offer complex care management services.  [name] was given information about Complex Care Management services today including:   The Complex Care Management services include support from the care team which includes your Nurse Care Manager, Clinical Social Worker, or Pharmacist.  The Complex Care Management team is here to help remove barriers to the health concerns and goals most important to you. Complex Care Management services are voluntary, and the patient may decline or stop services at any time by request to their care team member.   Complex Care Management Consent Status: Patient agreed to services and verbal consent obtained.   Follow up plan:  Telephone appointment with complex care management team member scheduled for:  BSW 01/07/2024 and RNCM 01/26/2024  Encounter Outcome:  Patient Scheduled  Lenton Rail , RMA     [place]  [facility], [facility] Guide  Direct Dial: [phone]  Website: [email or web address]

## 2024-01-07 ENCOUNTER — Other Ambulatory Visit: Payer: Self-pay

## 2024-01-07 NOTE — Patient Instructions (Signed)
 Visit Information  Thank you for taking time to visit with me today. Please don't hesitate to contact me if I can be of assistance to you before our next scheduled appointment.  Our next appointment is by telephone on 01/15/24 at 11am Please call the care guide team at [phone] if you need to cancel or reschedule your appointment.   Following is a copy of your care plan:   Goals Addressed             This Visit's Progress    BSW-VBCI Social Work Care Plan       Problems:   Patient needs PCS.  CSW Clinical Goal(s):   Over the next 7 days the Patient will work with Child psychotherapist to address concerns related to submitting PCS request to [facility].  Interventions:  SW discovered PCS request form completed in March 2025 was submitted to the Home Health provider.  SW faxed PCS request to [facility].  PCS form is dated and has the prior PCS vender [employer].  Patient Goals/Self-Care Activities:  Patient will await contact from [facility].  Plan:   Telephone follow up appointment with care management team member scheduled for:  01/15/24 at 11am        Please call 911 if you are experiencing a Mental Health or Behavioral Health Crisis or need someone to talk to.  Patient verbalizes understanding of instructions and care plan provided today and agrees to view in MyChart. Active MyChart status and patient understanding of how to access instructions and care plan via MyChart confirmed with patient.     [name], BSW [place]  [facility], [facility] Social Worker Direct Dial: [phone]  Fax: [phone] Website: [email or web address]

## 2024-01-07 NOTE — Patient Outreach (Signed)
 Complex Care Management   Visit Note  01/07/2024  Name:  Kristiana Jacko MRN: [id number] DOB: 1961-02-24  Situation: Referral received for Complex Care Management related to [facility] request I obtained verbal consent from Patient.  Visit completed with patient  on the phone  Background:   Past Medical History:  Diagnosis Date   Anemia    Anxiety    Arthritis    back, hands , knees    Chest pain 2010   due to sarcoidosis- per pt.    Chronic pain    Depression    GERD (gastroesophageal reflux disease)    Hypercholesterolemia    Hypertension    Pneumonia    sarcoidosis- in remission    Pulmonary sarcoidosis (HCC)    Treatment with Prednisone     Assessment:  Patient reports that she has been trying for months to obtain PCS.  SW discovered PCS request form completed in March 2025 was submitted to the Home Health provider.  SW faxed PCS request to [facility].  PCS form is dated and has the prior PCS vender [employer].   SDOH Interventions    Flowsheet Row Patient Outreach Telephone from 01/07/2024 in [place] [facility]PARTMENT Clinical Support from 12/31/2023 in [facility] Primary Care Office Visit from 10/27/2023 in [facility] Primary Care Clinical Support from 12/25/2022 in [facility] Primary Care Clinical Support from 08/14/2021 in [facility] Primary Care  SDOH Interventions       Food Insecurity Interventions Intervention Not Indicated Intervention Not Indicated -- Intervention Not Indicated Intervention Not Indicated  Housing Interventions Intervention Not Indicated Intervention Not Indicated -- Intervention Not Indicated [id number] Referral  Transportation Interventions Intervention Not Indicated Intervention Not Indicated -- Intervention Not Indicated Intervention Not Indicated  Utilities Interventions Intervention Not Indicated Intervention Not Indicated -- Intervention Not Indicated --  Alcohol  Usage Interventions --  Intervention Not Indicated (Score <7) -- Intervention Not Indicated (Score <7) --  Depression Interventions/Treatment  -- PHQ2-9 Score <4 Follow-up Not Indicated Counseling -- --  Financial Strain Interventions Intervention Not Indicated Intervention Not Indicated -- Intervention Not Indicated [id number] Referral  Physical Activity Interventions -- Other (Comments), Patient Declined -- Other (Comments), Patient Declined Intervention Not Indicated  Stress Interventions -- Patient Declined -- Intervention Not Indicated Intervention Not Indicated  Social Connections Interventions -- Intervention Not Indicated -- Intervention Not Indicated Intervention Not Indicated  Health Literacy Interventions -- Intervention Not Indicated -- -- --         Recommendation:   SW will inform provider of updated PCS form and that the form was submitted to [facility].  Follow Up Plan:   Telephone follow up appointment date/time:  01/15/24 at 11am.  Dallis Dues, BSW [place]  [facility], [facility], [place] Social Worker Direct Dial: [phone]  Fax: [phone] Website: [email or web address]

## 2024-01-09 ENCOUNTER — Telehealth: Payer: Self-pay | Admitting: Family Medicine

## 2024-01-09 NOTE — Telephone Encounter (Signed)
 NEW PCS FORMS  [Last declined form too old]  Noted  Copied Sleeved  Original in PCP box Copy front desk folder

## 2024-01-12 ENCOUNTER — Encounter: Payer: Self-pay | Admitting: Gastroenterology

## 2024-01-12 NOTE — Telephone Encounter (Signed)
 Placed in [employer] folder for signature

## 2024-01-13 NOTE — Telephone Encounter (Signed)
 Completed and faxed to [facility] at [phone]

## 2024-01-15 ENCOUNTER — Other Ambulatory Visit: Payer: Self-pay

## 2024-01-15 NOTE — Patient Outreach (Signed)
 Complex Care Management   Visit Note  01/15/2024  Name:  [name] MRN: [id number] DOB: 08/01/1961  Situation: Referral received for Complex Care Management related to PCS  I obtained verbal consent from Patient.  Visit completed with patient  on the phone  Background:   Past Medical History:  Diagnosis Date   Anemia    Anxiety    Arthritis    back, hands , knees    Chest pain 2010   due to sarcoidosis- per pt.    Chronic pain    Depression    GERD (gastroesophageal reflux disease)    Hypercholesterolemia    Hypertension    Pneumonia    sarcoidosis- in remission    Pulmonary sarcoidosis (HCC)    Treatment with Prednisone     Assessment:  Patient is informed PCS request has been submitted. Patient will await contact from [facility]. Patient does not request a follow up visit and will await update. If no response from [facility], patient will reach out to SW.   SDOH Interventions    Flowsheet Row Patient Outreach Telephone from 01/07/2024 in [place] POPUL[facility] DEPARTMENT Clinical Support from 12/31/2023 in [facility] Primary Care Office Visit from 10/27/2023 in [facility] Primary Care Clinical Support from 12/25/2022 in [facility] Primary Care Clinical Support from 08/14/2021 in [facility] Primary Care  SDOH Interventions       Food Insecurity Interventions Intervention Not Indicated Intervention Not Indicated -- Intervention Not Indicated Intervention Not Indicated  Housing Interventions Intervention Not Indicated Intervention Not Indicated -- Intervention Not Indicated [id number] Referral  Transportation Interventions Intervention Not Indicated Intervention Not Indicated -- Intervention Not Indicated Intervention Not Indicated  Utilities Interventions Intervention Not Indicated Intervention Not Indicated -- Intervention Not Indicated --  Alcohol  Usage Interventions -- Intervention Not Indicated (Score <7) -- Intervention Not  Indicated (Score <7) --  Depression Interventions/Treatment  -- PHQ2-9 Score <4 Follow-up Not Indicated Counseling -- --  Financial Strain Interventions Intervention Not Indicated Intervention Not Indicated -- Intervention Not Indicated [id number] Referral  Physical Activity Interventions -- Other (Comments), Patient Declined -- Other (Comments), Patient Declined Intervention Not Indicated  Stress Interventions -- Patient Declined -- Intervention Not Indicated Intervention Not Indicated  Social Connections Interventions -- Intervention Not Indicated -- Intervention Not Indicated Intervention Not Indicated  Health Literacy Interventions -- Intervention Not Indicated -- -- --         Recommendation:   none  Follow Up Plan:   Patient has met all care management goals. Care Management case will be closed. Patient has been provided contact information should new needs arise.   SIG Dallis Dues, BSW [place]  [facility], [facility] Social Worker Direct Dial: [phone]  Fax: [phone] Website: [email or web address]

## 2024-01-15 NOTE — Patient Instructions (Signed)

## 2024-01-16 DIAGNOSIS — G894 Chronic pain syndrome: Secondary | ICD-10-CM | POA: Diagnosis not present

## 2024-01-16 DIAGNOSIS — Z6834 Body mass index (BMI) 34.0-34.9, adult: Secondary | ICD-10-CM | POA: Diagnosis not present

## 2024-01-16 DIAGNOSIS — R109 Unspecified abdominal pain: Secondary | ICD-10-CM | POA: Diagnosis not present

## 2024-01-16 DIAGNOSIS — M545 Low back pain, unspecified: Secondary | ICD-10-CM | POA: Diagnosis not present

## 2024-01-19 NOTE — Progress Notes (Signed)
 This visit was performed by a medical professional under my direct supervision. I was immediately available for consultation/collaboration. I have reviewed and agree with the Annual Wellness Visit documentation.

## 2024-01-26 ENCOUNTER — Other Ambulatory Visit: Payer: Self-pay

## 2024-01-26 ENCOUNTER — Telehealth: Payer: Self-pay

## 2024-01-26 ENCOUNTER — Encounter: Payer: Self-pay | Admitting: *Deleted

## 2024-01-26 ENCOUNTER — Other Ambulatory Visit: Payer: Self-pay | Admitting: *Deleted

## 2024-01-26 NOTE — Patient Instructions (Addendum)
 Visit Information  Thank you for taking time to visit with me today. Please don't hesitate to contact me if I can be of assistance to you before our next scheduled appointment.  Our next appointment is no further scheduled appointments.   To check status of your PCS application with [employer] you can call them at [phone] Please call the care guide team at [phone] if you need to cancel or reschedule your appointment.   Following is a copy of your care plan:   Goals Addressed   None     Please call the Suicide and Crisis Lifeline: 988 call the [place]  National Suicide Prevention Lifeline: (215) 654-6958 or TTY: 615-352-3317 TTY 229-230-6936) to talk to a trained counselor call 1-800-273-TALK (toll free, 24 hour hotline) call the [facility]: ([phone] call 911 if you are experiencing a Mental Health or Behavioral Health Crisis or need someone to talk to.  Patient verbalizes understanding of instructions and care plan provided today and agrees to view in MyChart. Active MyChart status and patient understanding of how to access instructions and care plan via MyChart confirmed with patient.     Grandville Lax, BSN RN [facility], [facility] RN Care Manager Direct Dial: [phone]  Fax: [phone]  High Triglycerides Eating Plan Triglycerides are a type of fat in the blood. High levels of triglycerides can increase your risk of heart disease and stroke. If your triglyceride levels are high, choosing the right foods can help lower your triglycerides and keep your heart healthy. Work with your health care provider or a dietitian to develop an eating plan that is right for you. What are tips for following this plan? General guidelines  Lose weight, if you are overweight. For most people, losing 5-10 lb (2-5 kg) helps lower triglyceride levels. A weight-loss plan may include: 30 minutes of exercise at least 5 days a  week. Reducing the amount of calories, sugar, and fat you eat. Eat a wide variety of fresh fruits, vegetables, and whole grains. These foods are high in fiber. Eat foods that contain healthy fats, such as fatty fish, nuts, seeds, and olive oil. Avoid foods that are high in added sugar, added salt (sodium), and saturated fat. Avoid low-fiber, refined carbohydrates such as white bread, crackers, noodles, and white rice. Avoid foods with trans fats or partially hydrogenated oils, such as fried foods or stick margarine. If you drink alcohol : Limit how much you have to: 0-1 drink a day for women who are not pregnant. 0-2 drinks a day for men. Your health care provider may recommend that you drink less than these amounts depending on your overall health. Know how much alcohol  is in a drink. In the [place] one drink equals one 12 oz bottle of beer (355 mL), one 5 oz glass of wine (148 mL), or one 1 oz glass of hard liquor (44 mL). Reading food labels Check food labels for: The amount of saturated fat. Choose foods with no or very little saturated fat (less than 2 g). The amount of trans fat. Choose foods with no transfat. The amount of cholesterol. Choose foods that are low in cholesterol. The amount of sodium. Choose foods with less than 140 milligrams (mg) per serving. Shopping Buy dairy products labeled as nonfat (skim) or low-fat (1%). Avoid buying processed or prepackaged foods. These are often high in added sugar, sodium, and fat. Cooking Choose healthy fats when cooking, such as olive oil, avocado oil, or canola oil. Cook foods  using lower fat methods, such as baking, broiling, boiling, or grilling. Make your own sauces, dressings, and marinades when possible, instead of buying them. Store-bought sauces, dressings, and marinades are often high in sodium and sugar. Meal planning Eat more home-cooked food and less restaurant, buffet, and fast food. Eat fatty fish at least 2 times each week.  Examples of fatty fish include salmon, trout, sardines, mackerel, tuna, and herring. If you eat whole eggs, do not eat more than 4 egg yolks per week. What foods should I eat? Fruits All fresh, canned (in natural juice), or frozen fruits. Vegetables Fresh or frozen vegetables. Low-sodium canned vegetables. Grains Whole wheat or whole grain breads, crackers, cereals, and pasta. Unsweetened oatmeal. Bulgur. Barley. Quinoa. Brown rice. Whole wheat flour tortillas. Meats and other proteins Skinless chicken or [place]. Ground chicken or [place]. Lean cuts of pork, trimmed of fat. Fish and seafood, especially salmon, trout, and herring. Egg whites. Dried beans, peas, or lentils. Unsalted nuts or seeds. Unsalted canned beans. Natural peanut or almond butter or other nut butters. Dairy Low-fat dairy products. Skim or low-fat (1%) milk. Reduced fat (2%) and low-sodium cheese. Low-fat ricotta cheese. Low-fat cottage cheese. Plain, low-fat yogurt. Fats and oils Tub margarine without trans fats. Light or reduced-fat mayonnaise. Light or reduced-fat salad dressings. Avocado. Safflower, olive, sunflower, soybean, and canola oils. The items listed above may not be a complete list of recommended foods and beverages. Talk with your dietitian about what dietary choices are best for you. What foods should I avoid? Fruits Sweetened dried fruit. Canned fruit in syrup. Fruit juice. Vegetables Creamed or fried vegetables. Vegetables in a cheese sauce. Grains White bread. White (regular) pasta. White rice. Cornbread. Bagels. Pastries. Crackers that contain trans fat. Meats and other proteins Fatty cuts of meat. Ribs. Chicken wings. Helene Loader. Sausage. Bologna. Salami. Chitterlings. Fatback. Hot dogs. Bratwurst. Packaged lunch meats. Dairy Whole or reduced-fat (2%) milk. Half-and-half. Cream cheese. Full-fat or sweetened yogurt. Full-fat cheese. Nondairy creamers. Whipped toppings. Processed cheese or cheese spreads.  Cheese curds. Fats and oils Butter. Stick margarine. Lard. Shortening. Ghee. Bacon fat. Tropical oils, such as coconut, palm kernel, or palm oils. Beverages Alcohol . Sweetened drinks, such as soda, lemonade, fruit drinks, or punches. Sweets and desserts Corn syrup. Sugars. Honey. Molasses. Candy. Jam and jelly. Syrup. Sweetened cereals. Cookies. Pies. Cakes. Donuts. Muffins. Ice cream. Condiments Store-bought sauces, dressings, and marinades that are high in sugar, such as ketchup and barbecue sauce. The items listed above may not be a complete list of foods and beverages you should avoid. Talk with your dietitian about what dietary choices are best for you. Summary High levels of triglycerides can increase the risk of heart disease and stroke. Choosing the right foods can help lower your triglycerides. Eat plenty of fresh fruits, vegetables, and whole grains. Choose low-fat dairy and lean meats. Eat fatty fish at least twice a week. Avoid processed and prepackaged foods with added sugar, sodium, saturated fat, and trans fat. If you need suggestions or have questions about what types of food are good for you, talk with your health care provider or a dietitian. This information is not intended to replace advice given to you by your health care provider. Make sure you discuss any questions you have with your health care provider. Document Revised: 12/08/2020 Document Reviewed: 12/08/2020 Elsevier Patient Education  2024 Elsevier Inc.  High Cholesterol (Hyperlipidemia) The difference between HDL and LDL is explained. Healthy lifestyle changes (food choices, exercise, stop smoking) are described. If these  alone don't work, medication may be prescribed. To view the content, go to this web address: [email or web address]  This video will expire on: 07/23/2025. If you need access to this video following this date, please reach out to the healthcare provider who assigned it to you. This  information is not intended to replace advice given to you by your health care provider. Make sure you discuss any questions you have with your health care provider. Elsevier Patient Education  2024 Elsevier Inc.   Managing Hyperlipidemia After viewing this video, you will be able to describe how our body uses cholesterol, and the diagnosis and treatment of hyperlipidemia. To view the content, go to this web address: [email or web address]  This video will expire on: 07/23/2025. If you need access to this video following this date, please reach out to the healthcare provider who assigned it to you. This information is not intended to replace advice given to you by your health care provider. Make sure you discuss any questions you have with your health care provider. Elsevier Patient Education  2024 Elsevier Inc.  Managing the Challenge of Quitting Smoking Quitting smoking is a physical and mental challenge. You may have cravings, withdrawal symptoms, and temptation to smoke. Before quitting, work with your health care provider to make a plan that can help you manage quitting. Making a plan before you quit may keep you from smoking when you have the urge to smoke while trying to quit. How to manage lifestyle changes Managing stress Stress can make you want to smoke, and wanting to smoke may cause stress. It is important to find ways to manage your stress. You could try some of the following: Practice relaxation techniques. Breathe slowly and deeply, in through your nose and out through your mouth. Listen to music. Soak in a bath or take a shower. Imagine a peaceful place or vacation. Get some support. Talk with family or friends about your stress. Join a support group. Talk with a counselor or therapist. Get some physical activity. Go for a walk, run, or bike ride. Play a favorite sport. Practice yoga.  Medicines Talk with your health care provider about medicines that  might help you deal with cravings and make quitting easier for you. Relationships Social situations can be difficult when you are quitting smoking. To manage this, you can: Avoid parties and other social situations where people might be smoking. Avoid alcohol . Leave right away if you have the urge to smoke. Explain to your family and friends that you are quitting smoking. Ask for support and let them know you might be a bit grumpy. Plan activities where smoking is not an option. General instructions Be aware that many people gain weight after they quit smoking. However, not everyone does. To keep from gaining weight, have a plan in place before you quit, and stick to the plan after you quit. Your plan should include: Eating healthy snacks. When you have a craving, it may help to: Eat popcorn, or try carrots, celery, or other cut vegetables. Chew sugar-free gum. Changing how you eat. Eat small portion sizes at meals. Eat 4-6 small meals throughout the day instead of 1-2 large meals a day. Be mindful when you eat. You should avoid watching television or doing other things that might distract you as you eat. Exercising regularly. Make time to exercise each day. If you do not have time for a long workout, do short bouts of exercise for 5-10 minutes several times a day. Do some form of  strengthening exercise, such as weight lifting. Do some exercise that gets your heart beating and causes you to breathe deeply, such as walking fast, running, swimming, or biking. This is very important. Drinking plenty of water or other low-calorie or no-calorie drinks. Drink enough fluid to keep your urine pale yellow.  How to recognize withdrawal symptoms Your body and mind may experience discomfort as you try to get used to not having nicotine in your system. These effects are called withdrawal symptoms. They may include: Feeling hungrier than normal. Having trouble concentrating. Feeling irritable or  restless. Having trouble sleeping. Feeling depressed. Craving a cigarette. These symptoms may surprise you, but they are normal to have when quitting smoking. To manage withdrawal symptoms: Avoid places, people, and activities that trigger your cravings. Remember why you want to quit. Get plenty of sleep. Avoid coffee and other drinks that contain caffeine. These may worsen some of your symptoms. How to manage cravings Come up with a plan for how to deal with your cravings. The plan should include the following: A definition of the specific situation you want to deal with. An activity or action you will take to replace smoking. A clear idea for how this action will help. The name of someone who could help you with this. Cravings usually last for 5-10 minutes. Consider taking the following actions to help you with your plan to deal with cravings: Keep your mouth busy. Chew sugar-free gum. Suck on hard candies or a straw. Brush your teeth. Keep your hands and body busy. Change to a different activity right away. Squeeze or play with a ball. Do an activity or a hobby, such as making bead jewelry, practicing needlepoint, or working with wood. Mix up your normal routine. Take a short exercise break. Go for a quick walk, or run up and down stairs. Focus on doing something kind or helpful for someone else. Call a friend or family member to talk during a craving. Join a support group. Contact a quitline. Where to find support To get help or find a support group: Call the National Cancer Institute's Smoking Quitline: 1-800-QUIT-NOW 6233108545) Text QUIT to SmokefreeTXT: 981191 Where to find more information Visit these websites to find more information on quitting smoking: [place].S. Department of Health and Human Services: www.smokefree.gov American Lung Association: www.freedomfromsmoking.org Centers for Disease Control and Prevention (CDC): FootballExhibition.com.br American Heart Association:  www.heart.org Contact a health care provider if: You want to change your plan for quitting. The medicines you are taking are not helping. Your eating feels out of control or you cannot sleep. You feel depressed or become very anxious. Summary Quitting smoking is a physical and mental challenge. You will face cravings, withdrawal symptoms, and temptation to smoke again. Preparation can help you as you go through these challenges. Try different techniques to manage stress, handle social situations, and prevent weight gain. You can deal with cravings by keeping your mouth busy (such as by chewing gum), keeping your hands and body busy, calling family or friends, or contacting a quitline for people who want to quit smoking. You can deal with withdrawal symptoms by avoiding places where people smoke, getting plenty of rest, and avoiding drinks that contain caffeine. This information is not intended to replace advice given to you by your health care provider. Make sure you discuss any questions you have with your health care provider. Document Revised: 07/20/2021 Document Reviewed: 07/20/2021 Elsevier Patient Education  2024 [employer].

## 2024-01-26 NOTE — Patient Outreach (Signed)
 Complex Care Management   Visit Note  01/26/2024  Name:  [name] MRN: [id number] DOB: 02-07-1961  Situation: Referral received for Complex Care Management related to PCS I obtained verbal consent from Patient.  Visit completed with patient  on the phone  Background:   Past Medical History:  Diagnosis Date   Anemia    Anxiety    Arthritis    back, hands , knees    Chest pain 2010   due to sarcoidosis- per pt.    Chronic pain    Depression    GERD (gastroesophageal reflux disease)    Hypercholesterolemia    Hypertension    Pneumonia    sarcoidosis- in remission    Pulmonary sarcoidosis (HCC)    Treatment with Prednisone     Assessment: Patient Reported Symptoms:  Cognitive Cognitive Status: Alert and oriented to person, place, and time, Insightful and able to interpret abstract concepts, Normal speech and language skills Cognitive/Intellectual Conditions Management [RPT]: None reported or documented in medical history or problem list   Health Maintenance Behaviors: Annual physical exam  Neurological Neurological Review of Symptoms: No symptoms reported    HEENT HEENT Symptoms Reported: No symptoms reported      Cardiovascular Cardiovascular Symptoms Reported: No symptoms reported Does patient have uncontrolled Hypertension?: No Cardiovascular Conditions: Hypertension, High blood cholesterol Cardiovascular Management Strategies: Medication therapy, Routine screening  Respiratory Respiratory Symptoms Reported: No symptoms reported    Endocrine Patient reports the following symptoms related to hypoglycemia or hyperglycemia : No symptoms reported Is patient diabetic?: No    Gastrointestinal Gastrointestinal Symptoms Reported: No symptoms reported Gastrointestinal Conditions: Abdominal pain Nutrition Risk Screen (CP): Unintentional loss of 10 lbs or more in the past 2 months  Genitourinary Genitourinary Symptoms Reported: No symptoms reported    Integumentary  Integumentary Symptoms Reported: No symptoms reported    Musculoskeletal Musculoskelatal Symptoms Reviewed: No symptoms reported   Falls in the past year?: No Number of falls in past year: 1 or less Was there an injury with Fall?: No Fall Risk Category Calculator: 0 Patient Fall Risk Level: Low Fall Risk Fall risk Follow up: Falls evaluation completed  Psychosocial Psychosocial Symptoms Reported: No symptoms reported     Quality of Family Relationships: helpful, involved, supportive Do you feel physically threatened by others?: No      01/26/2024    2:20 PM  Depression screen PHQ 2/9  Decreased Interest 0  Down, Depressed, Hopeless 0  PHQ - 2 Score 0    Vitals:   01/26/24 1410  BP: 116/70    Medications Reviewed Today     Reviewed by Remona Carmel, RN (Registered Nurse) on 01/26/24 at 1415  Med List Status: <None>   Medication Order Taking? Sig Documenting Provider Last Dose Status Informant  acetaminophen  (TYLENOL ) 325 MG tablet [id number]  Take 2 tablets (650 mg total) by mouth every 6 (six) hours as needed.  Patient not taking: Reported on 01/26/2024   Russella Courts A, DO  Active   amLODipine  (NORVASC ) 10 MG tablet [id number] Yes Take 1 tablet (10 mg total) by mouth daily. Zarwolo, Gloria, FNP  Active   atorvastatin  (LIPITOR) 80 MG tablet [id number] Yes Take 1 tablet (80 mg total) by mouth daily. Zarwolo, Gloria, FNP  Active   Blood Pressure Monitoring (BLOOD PRESSURE KIT) KIT [id number] Yes Check your blood pressure 3 x a week Paseda, Folashade R, FNP  Active   Cholecalciferol (VITAMIN D3) 25 MCG (1000 UT) CAPS [id number] Yes Take 1 capsule (1,000 Units total)  by mouth daily. Paseda, Folashade R, FNP  Active   dicyclomine  (BENTYL ) 20 MG tablet [id number] Yes Take 1 tablet (20 mg total) by mouth 2 (two) times daily. Zarwolo, Gloria, FNP  Active   ezetimibe  (ZETIA ) 10 MG tablet [id number] Yes TAKE ONE TABLET BY MOUTH ONCE DAILY. Zarwolo, Gloria, FNP  Active   fenofibrate   (TRICOR ) 48 MG tablet [id number] Yes Take 1 tablet (48 mg total) by mouth daily. Zarwolo, Gloria, FNP  Active   fluticasone  (FLONASE ) 50 MCG/ACT nasal spray [id number] Yes Place 2 sprays into both nostrils daily. Zarwolo, Gloria, FNP  Active   ibuprofen  (ADVIL ) 800 MG tablet [id number]  Take 1 tablet (800 mg total) by mouth every 8 (eight) hours as needed.  Patient not taking: Reported on 01/26/2024   Kinsinger, Alphonso Aschoff, MD  Consider Medication Status and Discontinue   lisinopril  (ZESTRIL ) 5 MG tablet [id number] Yes TAKE ONE TABLET BY MOUTH ONCE DAILY. Zarwolo, Gloria, FNP  Active   Multiple Vitamins-Minerals (WOMENS 50+ MULTI VITAMIN/MIN PO) [id number] Yes Take 1 tablet by mouth daily. [provider]  Active Self  ondansetron  (ZOFRAN -ODT) 8 MG disintegrating tablet [id number] Yes Take 1 tablet (8 mg total) by mouth every 8 (eight) hours as needed for nausea or vomiting. Steinl, Kevin, MD  Active   pantoprazole  (PROTONIX ) 40 MG tablet [id number] Yes Take 1 tablet (40 mg total) by mouth 2 (two) times daily. Jerilynn Montenegro, MD  Active   potassium chloride  (KLOR-CON ) 10 MEQ tablet [id number]  Take 1 tablet (10 mEq total) by mouth 2 (two) times daily.  Patient not taking: Reported on 01/26/2024   Zarwolo, Gloria, FNP  Consider Medication Status and Discontinue   potassium chloride  SA (KLOR-CON  M) 20 MEQ tablet [id number] Yes One po bid x 4 days, then one po once a day Steinl, Kevin, MD  Active   RESTASIS 0.05 % ophthalmic emulsion [id number] Yes 1 drop 2 (two) times daily. [provider]  Active   sucralfate  (CARAFATE ) 1 GM/10ML suspension [id number] Yes Take 10 mLs (1 g total) by mouth 4 (four) times daily -  with meals and at bedtime. Zarwolo, Gloria, FNP  Active   traMADol  (ULTRAM ) 50 MG tablet [id number] Yes Take 1 tablet (50 mg total) by mouth every 6 (six) hours as needed. Steinl, Kevin, MD  Active   Vitamin D , Ergocalciferol , (DRISDOL ) 1.25 MG (50000 UNIT) CAPS capsule [id number] Yes Take  1 capsule (50,000 Units total) by mouth every 7 (seven) days. Zarwolo, Gloria, FNP  Active             Recommendation:   Continue Current Plan of Care  Follow Up Plan:   Patient declined further follow up at this time  Grandville Lax, BSN RN [facility], [facility] RN Care Manager Direct Dial: [phone]  Fax: [phone]

## 2024-01-26 NOTE — Telephone Encounter (Signed)
 Pt called to see if she had an appt w/ our office and was advised she did not

## 2024-01-27 DIAGNOSIS — K222 Esophageal obstruction: Secondary | ICD-10-CM | POA: Diagnosis not present

## 2024-01-29 ENCOUNTER — Other Ambulatory Visit: Payer: Self-pay | Admitting: Family Medicine

## 2024-01-29 DIAGNOSIS — R1013 Epigastric pain: Secondary | ICD-10-CM

## 2024-02-04 DIAGNOSIS — J4530 Mild persistent asthma, uncomplicated: Secondary | ICD-10-CM | POA: Diagnosis not present

## 2024-02-04 DIAGNOSIS — D869 Sarcoidosis, unspecified: Secondary | ICD-10-CM | POA: Diagnosis not present

## 2024-02-04 DIAGNOSIS — F17210 Nicotine dependence, cigarettes, uncomplicated: Secondary | ICD-10-CM | POA: Diagnosis not present

## 2024-02-17 ENCOUNTER — Ambulatory Visit: Admitting: Nurse Practitioner

## 2024-02-26 ENCOUNTER — Ambulatory Visit: Admitting: Family Medicine

## 2024-03-04 ENCOUNTER — Ambulatory Visit: Admitting: Gastroenterology

## 2024-03-04 ENCOUNTER — Encounter: Payer: Self-pay | Admitting: Gastroenterology

## 2024-03-04 VITALS — BP 120/60 | HR 70 | Ht 62.5 in | Wt 184.6 lb

## 2024-03-04 DIAGNOSIS — R9389 Abnormal findings on diagnostic imaging of other specified body structures: Secondary | ICD-10-CM | POA: Diagnosis not present

## 2024-03-04 DIAGNOSIS — K859 Acute pancreatitis without necrosis or infection, unspecified: Secondary | ICD-10-CM | POA: Diagnosis not present

## 2024-03-04 DIAGNOSIS — R1310 Dysphagia, unspecified: Secondary | ICD-10-CM

## 2024-03-04 DIAGNOSIS — K222 Esophageal obstruction: Secondary | ICD-10-CM

## 2024-03-04 DIAGNOSIS — Z1211 Encounter for screening for malignant neoplasm of colon: Secondary | ICD-10-CM

## 2024-03-04 MED ORDER — NA SULFATE-K SULFATE-MG SULF 17.5-3.13-1.6 GM/177ML PO SOLN: 1.0000 | Freq: Once | ORAL | 0 refills | Status: AC

## 2024-03-04 NOTE — Patient Instructions (Signed)
 You have been scheduled for an MRI at [facility] on Thursday 03/11/24. Your appointment time is 8:45 am. Please arrive to admitting (at main entrance of the hospital) 30 minutes prior to your appointment time for registration purposes. Please make certain not to have anything to eat or drink 6 hours prior to your test. In addition, if you have any metal in your body, have a pacemaker or defibrillator, please be sure to let your ordering physician know. This test typically takes 45 minutes to 1 hour to complete. Should you need to reschedule, please call [phone] to do so.  You have been scheduled for an endoscopy and colonoscopy. Please follow the written instructions given to you at your visit today.  If you use inhalers (even only as needed), please bring them with you on the day of your procedure.  DO NOT TAKE 7 DAYS PRIOR TO TEST- Trulicity (dulaglutide) Ozempic, Wegovy (semaglutide) Mounjaro (tirzepatide) Bydureon Bcise (exanatide extended release)  DO NOT TAKE 1 DAY PRIOR TO YOUR TEST Rybelsus (semaglutide) Adlyxin (lixisenatide) Victoza (liraglutide) Byetta (exanatide) _______________________________________________________  If your blood pressure at your visit was 140/90 or greater, please contact your primary care physician to follow up on this.  _______________________________________________________  If you are age 54 or older, your body mass index should be between 23-30. Your Body mass index is 33.23 kg/m. If this is out of the aforementioned range listed, please consider follow up with your Primary Care Provider.  If you are age 72 or younger, your body mass index should be between 19-25. Your Body mass index is 33.23 kg/m. If this is out of the aformentioned range listed, please consider follow up with your Primary Care Provider.   ________________________________________________________  The [facility] providers would like to encourage you to use MYCHART to  communicate with providers for non-urgent requests or questions.  Due to long hold times on the telephone, sending your provider a message by [facility] may be a faster and more efficient way to get a response.  Please allow 48 business hours for a response.  Please remember that this is for non-urgent requests.  _______________________________________________________  [facility] is using a team-based approach to care.  Your team is made up of your doctor and two to three APPS. Our APPS (Nurse Practitioners and Physician Assistants) work with your physician to ensure care continuity for you. They are fully qualified to address your health concerns and develop a treatment plan. They communicate directly with your gastroenterologist to care for you. Seeing the Advanced Practice Practitioners on your physician's team can help you by facilitating care more promptly, often allowing for earlier appointments, access to diagnostic testing, procedures, and other specialty referrals.

## 2024-03-04 NOTE — Progress Notes (Signed)
 03/04/2024 [name] [id number] 1961-06-17   HISTORY OF PRESENT ILLNESS:  This is a 63 year old female who is new to our office.  She is here today to discuss possible endoscopy.  She says that for the past 3 to 4 months she has had issues with swallowing, food getting stuck and bring it back up again.  She tells me that she had a sleeve gastrectomy 3 years ago.  She saw her surgeon, Dr. Stevie, for these symptoms and he performed an upper GI study in May.  This showed findings suspicious for presence of a distal esophageal stricture.  Also had a possible small ulcer in the gastric antrum.  Also showed moderate esophageal dysmotility.  That study is listed below.  Nonetheless, she has been on pantoprazole  40 mg daily for about a year.  She was recently started on Carafate  suspension.  Says that she is lost 20 pounds recently.  UGI 12/2023:  IMPRESSION: 1. A swallowed 13 mm barium tablet did not pass beyond the level of the distal esophagus despite a prolonged period of observation, and despite the patient taking additional swallows of water and barium contrast. Findings are suspicious for the presence of a distal esophageal stricture and endoscopy should be considered for further evaluation. 2. Possible small ulcer within the gastric antrum. Endoscopy should be considered. 3. Moderate esophageal dysmotility with tertiary contractions.  In addition I was reviewing imaging she has had 2 CT scans of the abdomen and pelvis with contrast this year that suggested some peripancreatic stranding.  Most recent listed below.  She denies being diagnosed with pancreatitis.  She does not recall having upper abdominal pain.  She uses rare alcohol  in the form of a few beers on occasion.  Does not the gallbladder.  LFTs are normal.  Biliary tree is normal on imaging.  Looks like in July 2024 she had an elevated lipase at 72, January of this year it was minimally elevated at 60, but in March of this  year was normal at 50.  CT scan of the abdomen and pelvis with contrast in 10/2023:  IMPRESSION: 1. Mild peripancreatic stranding about the pancreatic uncinate process and body, which may reflect acute interstitial pancreatitis. Differential also includes primary pancreatic neoplasm. Recommend consultation to gastroenterology. No peripancreatic fluid collection. 2. Hepatic steatosis. 3. Nonobstructing punctate upper pole right renal stones. 4. Diffuse heterogeneous sclerosis of the left iliac bone and proximal femur, which is nonspecific, however can be seen in the setting of Paget's disease. 5.  Aortic Atherosclerosis (ICD10-I70.0).  Past Medical History:  Diagnosis Date   Anemia    Anxiety    Arthritis    back, hands , knees    Chest pain 2010   due to sarcoidosis- per pt.    Chronic pain    Depression    GERD (gastroesophageal reflux disease)    Hypercholesterolemia    Hypertension    Pneumonia    sarcoidosis- in remission    Pulmonary sarcoidosis (HCC)    Treatment with Prednisone    Past Surgical History:  Procedure Laterality Date   ABDOMINAL HYSTERECTOMY     APPENDECTOMY     CHOLECYSTECTOMY     EXCISION MASS ABDOMINAL N/A 03/20/2020   Procedure: Excision of anterior abdominal wall fat necrosis;  Surgeon: Elisabeth Craig RAMAN, MD;  Location: [facility];  Service: Plastics;  Laterality: N/A;   HERNIA REPAIR     INCISIONAL HERNIA REPAIR N/A 07/01/2019   Procedure: OPEN INCISIONAL HERNIA  REPAIR WITH MESH;  Surgeon: Kinsinger, Herlene Righter, MD;  Location: [facility] OR;  Service: General;  Laterality: N/A;   INCISIONAL HERNIA REPAIR N/A 02/20/2021   Procedure: LAPAROSCOPIC RECURRENT INCISIONAL HERNIA REPAIR WITH MESH;  Surgeon: Stevie Herlene Righter, MD;  Location: [facility];  Service: General;  Laterality: N/A;   INSERTION OF MESH N/A 07/01/2019   Procedure: Insertion Of Mesh;  Surgeon: Kinsinger, Herlene Righter, MD;  Location: [facility] OR;  Service: General;  Laterality: N/A;    LAPAROSCOPIC GASTRIC SLEEVE RESECTION N/A 02/15/2019   Procedure: LAPAROSCOPIC GASTRIC SLEEVE RESECTION, UPPER ENDO, ERAS Pathway, Hiatal Hernia Repair;  Surgeon: Kinsinger, Herlene Righter, MD;  Location: [facility];  Service: General;  Laterality: N/A;   LAPAROSCOPY N/A 02/17/2019   Procedure: LAPAROSCOPY DIAGNOSTIC CONVERTED TO OPEN, EVACUATION OF HEMATOMA AND LIGATION OF VESSEL;  Surgeon: Kinsinger, Herlene Righter, MD;  Location: [facility];  Service: General;  Laterality: N/A;   LAPAROSCOPY N/A 07/01/2019   Procedure: Laparoscopy Diagnostic;  Surgeon: Stevie Herlene Righter, MD;  Location: [facility] OR;  Service: General;  Laterality: N/A;   SMALL INTESTINE SURGERY      reports that she has been smoking cigarettes. She started smoking about 32 years ago. She has a 6 pack-year smoking history. She has been exposed to tobacco smoke. She has never used smokeless tobacco. She reports that she does not currently use alcohol . She reports that she does not use drugs. family history includes Brain cancer in her sister; Heart attack in her brother; Heart disease in her brother; Hyperlipidemia in her mother; Hypertension in her mother; Kidney disease in her maternal grandmother; Lupus in her father. Allergies  Allergen Reactions   Aspirin Hives      Outpatient Encounter Medications as of 03/04/2024  Medication Sig   acetaminophen  (TYLENOL ) 325 MG tablet Take 2 tablets (650 mg total) by mouth every 6 (six) hours as needed.   amLODipine  (NORVASC ) 10 MG tablet Take 1 tablet (10 mg total) by mouth daily.   atorvastatin  (LIPITOR) 80 MG tablet Take 1 tablet (80 mg total) by mouth daily.   Blood Pressure Monitoring (BLOOD PRESSURE KIT) KIT Check your blood pressure 3 x a week   Cholecalciferol (VITAMIN D3) 25 MCG (1000 UT) CAPS Take 1 capsule (1,000 Units total) by mouth daily.   dicyclomine  (BENTYL ) 20 MG tablet Take 1 tablet (20 mg total) by mouth 2 (two) times daily.   ezetimibe  (ZETIA ) 10 MG tablet TAKE ONE TABLET BY MOUTH ONCE  DAILY.   fenofibrate  (TRICOR ) 48 MG tablet Take 1 tablet (48 mg total) by mouth daily.   fluticasone  (FLONASE ) 50 MCG/ACT nasal spray Place 2 sprays into both nostrils daily.   ibuprofen  (ADVIL ) 800 MG tablet Take 1 tablet (800 mg total) by mouth every 8 (eight) hours as needed.   lisinopril  (ZESTRIL ) 5 MG tablet TAKE ONE TABLET BY MOUTH ONCE DAILY.   Multiple Vitamins-Minerals (WOMENS 50+ MULTI VITAMIN/MIN PO) Take 1 tablet by mouth daily.   ondansetron  (ZOFRAN -ODT) 8 MG disintegrating tablet Take 1 tablet (8 mg total) by mouth every 8 (eight) hours as needed for nausea or vomiting.   pantoprazole  (PROTONIX ) 40 MG tablet Take 1 tablet (40 mg total) by mouth 2 (two) times daily.   potassium chloride  (KLOR-CON ) 10 MEQ tablet Take 1 tablet (10 mEq total) by mouth 2 (two) times daily.   potassium chloride  SA (KLOR-CON  M) 20 MEQ tablet One po bid x 4 days, then one po once a day   RESTASIS 0.05 % ophthalmic emulsion 1  drop 2 (two) times daily.   sucralfate  (CARAFATE ) 1 GM/10ML suspension TAKE 10 ML BY MOUTH FOUR TIMES DAILY WITH MEALS AND AT BEDTIME   traMADol  (ULTRAM ) 50 MG tablet Take 1 tablet (50 mg total) by mouth every 6 (six) hours as needed.   Vitamin D , Ergocalciferol , (DRISDOL ) 1.25 MG (50000 UNIT) CAPS capsule Take 1 capsule (50,000 Units total) by mouth every 7 (seven) days.   No facility-administered encounter medications on file as of 03/04/2024.     REVIEW OF SYSTEMS  : All other systems reviewed and negative except where noted in the History of Present Illness.   PHYSICAL EXAM: BP 120/60   Pulse 70   Ht 5' 2.5 (1.588 m)   Wt 184 lb 9.6 oz (83.7 kg)   BMI 33.23 kg/m  General: Well developed AA female in no acute distress Head: Normocephalic and atraumatic Eyes:  sclerae anicteric,conjunctive pink. Ears: Normal auditory acuity Lungs: Clear throughout to auscultation; no W/R/R. Heart: Regular rate and rhythm; no M/R/G. Abdomen: Soft, non-distended.  BS present.   Non-tender. Rectal:  Will be done at the time of colonoscopy. Musculoskeletal: Symmetrical with no gross deformities  Skin: No lesions on visible extremities Extremities: No edema  Neurological: Alert oriented x 4, grossly non-focal Psychological:  Alert and cooperative. Normal mood and affect  ASSESSMENT AND PLAN: *Dysphagia with finding of distal esophageal stricture on esophagram.  Will plan for EGD with dilation with Dr. Legrand. *Abnormal CT scan of the pancreas:  Mild peripancreatic stranding about the pancreatic uncinate process and body, which may reflect acute interstitial pancreatitis. Differential also includes primary pancreatic neoplasm.  Will check MRI pancreatic protocol.  Lipase was normal at 50 at that time.  She is not a gallbladder.  LFTs are normal.  Biliary tree appears normal.  Uses only occasional alcohol . *CRC screening: Has a Cologuard kit at home, but has not completed it.  Says she is having hard time trying to find the time to do it.  Is agreeable to colonoscopy at the same time as her EGD.  **The risks, benefits, and alternatives to EGD with dilation and colonoscopy were discussed with the patient and she consents to proceed.   CC:  Zarwolo, Gloria, FNP

## 2024-03-08 NOTE — Progress Notes (Signed)
 ____________________________________________________________  Attending physician addendum:  Thank you for sending this case to me. I have reviewed the entire note and agree with the plan.   Amada Jupiter, MD  ____________________________________________________________

## 2024-03-11 ENCOUNTER — Other Ambulatory Visit: Payer: Self-pay | Admitting: Gastroenterology

## 2024-03-11 ENCOUNTER — Ambulatory Visit (HOSPITAL_COMMUNITY)
Admission: RE | Admit: 2024-03-11 | Discharge: 2024-03-11 | Disposition: A | Discharge status: Home / Self Care | Source: Ambulatory Visit | Attending: Gastroenterology | Admitting: Gastroenterology

## 2024-03-11 DIAGNOSIS — R9389 Abnormal findings on diagnostic imaging of other specified body structures: Secondary | ICD-10-CM | POA: Diagnosis present

## 2024-03-11 DIAGNOSIS — K859 Acute pancreatitis without necrosis or infection, unspecified: Secondary | ICD-10-CM | POA: Insufficient documentation

## 2024-03-11 MED ORDER — GADOBUTROL 1 MMOL/ML IV SOLN
8.0000 mL | Freq: Once | INTRAVENOUS | Status: AC | PRN
  Administered 2024-03-11: 8 mL via INTRAVENOUS

## 2024-03-12 ENCOUNTER — Ambulatory Visit: Payer: Self-pay | Admitting: Gastroenterology

## 2024-03-24 ENCOUNTER — Encounter: Payer: Self-pay | Admitting: Gastroenterology

## 2024-03-31 ENCOUNTER — Ambulatory Visit (AMBULATORY_SURGERY_CENTER): Admitting: Gastroenterology

## 2024-03-31 ENCOUNTER — Encounter: Payer: Self-pay | Admitting: Gastroenterology

## 2024-03-31 VITALS — BP 103/55 | HR 77 | Temp 97.7°F | Resp 19 | Ht 62.0 in | Wt 184.0 lb

## 2024-03-31 DIAGNOSIS — K2950 Unspecified chronic gastritis without bleeding: Secondary | ICD-10-CM

## 2024-03-31 DIAGNOSIS — Q399 Congenital malformation of esophagus, unspecified: Secondary | ICD-10-CM | POA: Diagnosis not present

## 2024-03-31 DIAGNOSIS — R1310 Dysphagia, unspecified: Secondary | ICD-10-CM

## 2024-03-31 DIAGNOSIS — Z903 Acquired absence of stomach [part of]: Secondary | ICD-10-CM

## 2024-03-31 DIAGNOSIS — Z1211 Encounter for screening for malignant neoplasm of colon: Secondary | ICD-10-CM | POA: Diagnosis present

## 2024-03-31 DIAGNOSIS — K222 Esophageal obstruction: Secondary | ICD-10-CM

## 2024-03-31 DIAGNOSIS — K3189 Other diseases of stomach and duodenum: Secondary | ICD-10-CM

## 2024-03-31 DIAGNOSIS — R1319 Other dysphagia: Secondary | ICD-10-CM

## 2024-03-31 DIAGNOSIS — R1314 Dysphagia, pharyngoesophageal phase: Secondary | ICD-10-CM | POA: Diagnosis not present

## 2024-03-31 MED ORDER — SODIUM CHLORIDE 0.9 % IV SOLN: 500.0000 mL | Freq: Once | INTRAVENOUS | Status: DC

## 2024-03-31 NOTE — Op Note (Signed)
 [facility] Patient Name: [name] Procedure Date: 03/31/2024 3:01 PM MRN: [id number] Endoscopist: Victory L. Legrand , MD, [id number] Age: 63 Referring MD:  Date of Birth: 1960/11/21 Gender: Female Account #: [id number] Procedure:                Colonoscopy Indications:              Screening for colorectal malignant neoplasm Medicines:                Monitored Anesthesia Care Procedure:                Pre-Anesthesia Assessment:                           - Prior to the procedure, a History and Physical                            was performed, and patient medications and                            allergies were reviewed. The patient's tolerance of                            previous anesthesia was also reviewed. The risks                            and benefits of the procedure and the sedation                            options and risks were discussed with the patient.                            All questions were answered, and informed consent                            was obtained. Prior Anticoagulants: The patient has                            taken no anticoagulant or antiplatelet agents. ASA                            Grade Assessment: II - A patient with mild systemic                            disease. After reviewing the risks and benefits,                            the patient was deemed in satisfactory condition to                            undergo the procedure.                           After obtaining informed consent, the colonoscope  was passed under direct vision. Throughout the                            procedure, the patient's blood pressure, pulse, and                            oxygen saturations were monitored continuously. The                            CF [id number] #[id number] was introduced through the anus                            and advanced to the the cecum, identified by                            appendiceal  orifice and ileocecal valve. The                            colonoscopy was performed without difficulty. The                            patient tolerated the procedure well. The quality                            of the bowel preparation was poor. The ileocecal                            valve, appendiceal orifice, and rectum were                            photographed. The bowel preparation used was SUPREP. Scope In: 3:05:51 PM Scope Out: 3:11:05 PM Scope Withdrawal Time: 0 hours 2 minutes 41 seconds  Total Procedure Duration: 0 hours 5 minutes 14 seconds  Findings:                 The perianal and digital rectal examinations were                            normal.                           Solid stool was found in the entire colon,                            precluding visualization. Complications:            No immediate complications. Estimated Blood Loss:     Estimated blood loss: none. Impression:               - Preparation of the colon was poor.                           - Stool in the entire examined colon.                           - No specimens  collected. Recommendation:           - Patient has a contact number available for                            emergencies. The signs and symptoms of potential                            delayed complications were discussed with the                            patient. Return to normal activities tomorrow.                            Written discharge instructions were provided to the                            patient.                           - Resume previous diet.                           - Continue present medications.                           - See the other procedure note for documentation of                            additional recommendations.                           - Repeat colonoscopy in 3-6 months for screening                            purposes. Golytely  prep for next exam Omere Marti L. Legrand, MD 03/31/2024 3:15:30  PM This report has been signed electronically.

## 2024-03-31 NOTE — Progress Notes (Signed)
 Sedate, gd SR, tolerated procedure well, VSS, report to RN

## 2024-03-31 NOTE — Progress Notes (Signed)
 History and Physical:  This patient presents for endoscopic testing for: Encounter Diagnoses  Name Primary?   Esophageal dysphagia    Special screening for malignant neoplasms, colon Yes    Clinical details in 03/04/24 office consult note. Dysphagia with UGIS showing probable distal esophageal stricture with failure of barium tablet to pass.  S/p sleeve gastrectomy  Average risk for CRC - screening colonoscopy  Patient is otherwise without complaints or active issues today.   Past Medical History: Past Medical History:  Diagnosis Date   Anemia    Anxiety    Arthritis    back, hands , knees    Chest pain 2010   due to sarcoidosis- per pt.    Chronic pain    Depression    GERD (gastroesophageal reflux disease)    Hypercholesterolemia    Hypertension    Pneumonia    sarcoidosis- in remission    Pulmonary sarcoidosis (HCC)    Treatment with Prednisone      Past Surgical History: Past Surgical History:  Procedure Laterality Date   ABDOMINAL HYSTERECTOMY     APPENDECTOMY     CHOLECYSTECTOMY     EXCISION MASS ABDOMINAL N/A 03/20/2020   Procedure: Excision of anterior abdominal wall fat necrosis;  Surgeon: Elisabeth Craig RAMAN, MD;  Location:  [facility]ERY [facility];  Service: Plastics;  Laterality: N/A;   HERNIA REPAIR     INCISIONAL HERNIA REPAIR N/A 07/01/2019   Procedure: OPEN INCISIONAL HERNIA REPAIR WITH MESH;  Surgeon: Kinsinger, Herlene Righter, MD;  Location: [facility] OR;  Service: General;  Laterality: N/A;   INCISIONAL HERNIA REPAIR N/A 02/20/2021   Procedure: LAPAROSCOPIC RECURRENT INCISIONAL HERNIA REPAIR WITH MESH;  Surgeon: Stevie Herlene Righter, MD;  Location: [facility];  Service: General;  Laterality: N/A;   INSERTION OF MESH N/A 07/01/2019   Procedure: Insertion Of Mesh;  Surgeon: Kinsinger, Herlene Righter, MD;  Location: [facility] OR;  Service: General;  Laterality: N/A;   LAPAROSCOPIC GASTRIC SLEEVE RESECTION N/A 02/15/2019   Procedure: LAPAROSCOPIC GASTRIC SLEEVE RESECTION, UPPER ENDO,  ERAS Pathway, Hiatal Hernia Repair;  Surgeon: Kinsinger, Herlene Righter, MD;  Location: [facility];  Service: General;  Laterality: N/A;   LAPAROSCOPY N/A 02/17/2019   Procedure: LAPAROSCOPY DIAGNOSTIC CONVERTED TO OPEN, EVACUATION OF HEMATOMA AND LIGATION OF VESSEL;  Surgeon: Kinsinger, Herlene Righter, MD;  Location: [facility];  Service: General;  Laterality: N/A;   LAPAROSCOPY N/A 07/01/2019   Procedure: Laparoscopy Diagnostic;  Surgeon: Stevie Herlene Righter, MD;  Location: [facility] OR;  Service: General;  Laterality: N/A;   SMALL INTESTINE SURGERY      Allergies: Allergies  Allergen Reactions   Aspirin Hives    Outpatient Meds: Current Outpatient Medications  Medication Sig Dispense Refill   amLODipine  (NORVASC ) 10 MG tablet Take 1 tablet (10 mg total) by mouth daily. 90 tablet 2   atorvastatin  (LIPITOR) 80 MG tablet Take 1 tablet (80 mg total) by mouth daily. 90 tablet 1   Cholecalciferol (VITAMIN D3) 25 MCG (1000 UT) CAPS Take 1 capsule (1,000 Units total) by mouth daily. 30 capsule 3   dicyclomine  (BENTYL ) 20 MG tablet Take 1 tablet (20 mg total) by mouth 2 (two) times daily. 20 tablet 0   ezetimibe  (ZETIA ) 10 MG tablet TAKE ONE TABLET BY MOUTH ONCE DAILY. 90 tablet 3   fenofibrate  (TRICOR ) 48 MG tablet Take 1 tablet (48 mg total) by mouth daily. 90 tablet 0   HYDROcodone -acetaminophen  (NORCO/VICODIN) 5-325 MG tablet Take 1 tablet by mouth 5 (five) times daily as needed.  lisinopril  (ZESTRIL ) 5 MG tablet TAKE ONE TABLET BY MOUTH ONCE DAILY. 90 tablet 3   Multiple Vitamins-Minerals (WOMENS 50+ MULTI VITAMIN/MIN PO) Take 1 tablet by mouth daily.     potassium chloride  (KLOR-CON ) 10 MEQ tablet Take 1 tablet (10 mEq total) by mouth 2 (two) times daily. 60 tablet 1   RESTASIS 0.05 % ophthalmic emulsion 1 drop 2 (two) times daily.     acetaminophen  (TYLENOL ) 325 MG tablet Take 2 tablets (650 mg total) by mouth every 6 (six) hours as needed. 36 tablet 0   Blood Pressure Monitoring (BLOOD PRESSURE KIT) KIT  Check your blood pressure 3 x a week 1 kit 0   fluticasone  (FLONASE ) 50 MCG/ACT nasal spray Place 2 sprays into both nostrils daily. 16 g 0   ibuprofen  (ADVIL ) 800 MG tablet Take 1 tablet (800 mg total) by mouth every 8 (eight) hours as needed. 30 tablet 0   ondansetron  (ZOFRAN -ODT) 8 MG disintegrating tablet Take 1 tablet (8 mg total) by mouth every 8 (eight) hours as needed for nausea or vomiting. 10 tablet 0   pantoprazole  (PROTONIX ) 40 MG tablet Take 1 tablet (40 mg total) by mouth 2 (two) times daily. 60 tablet 0   potassium chloride  SA (KLOR-CON  M) 20 MEQ tablet One po bid x 4 days, then one po once a day 20 tablet 0   sucralfate  (CARAFATE ) 1 GM/10ML suspension TAKE 10 ML BY MOUTH FOUR TIMES DAILY WITH MEALS AND AT BEDTIME 420 mL 0   traMADol  (ULTRAM ) 50 MG tablet Take 1 tablet (50 mg total) by mouth every 6 (six) hours as needed. 15 tablet 0   Vitamin D , Ergocalciferol , (DRISDOL ) 1.25 MG (50000 UNIT) CAPS capsule Take 1 capsule (50,000 Units total) by mouth every 7 (seven) days. 20 capsule 1   Current Facility-Administered Medications  Medication Dose Route Frequency Provider Last Rate Last Admin   0.9 %  sodium chloride  infusion  500 mL Intravenous Once Danis, Ryane Canavan L III, MD          ___________________________________________________________________ Objective   Exam:  BP 121/86   Pulse 68   Temp 97.7 F (36.5 C)   Ht 5' 2 (1.575 m)   Wt 184 lb (83.5 kg)   SpO2 98%   BMI 33.65 kg/m   CV: regular , S1/S2 Resp: clear to auscultation bilaterally, normal RR and effort noted GI: soft, no tenderness, with active bowel sounds.   Assessment: Encounter Diagnoses  Name Primary?   Esophageal dysphagia    Special screening for malignant neoplasms, colon Yes     Plan: Colonoscopy EGD with dilation  The benefits and risks of the planned procedure(s) were described in detail with the patient or (when appropriate) their health care proxy.  Risks were outlined as including,  but not limited to, bleeding, infection, perforation, adverse medication reaction leading to cardiac or pulmonary decompensation, pancreatitis (if ERCP).  The limitation of incomplete mucosal visualization was also discussed.  No guarantees or warranties were given.  The patient is appropriate for an endoscopic procedure in the ambulatory setting.   - Victory Brand, MD

## 2024-03-31 NOTE — Patient Instructions (Addendum)
 Educational handout provided to patient related to Stricture  Resume previous diet  Continue present medications  Awaiting pathology results   **Dr. Legrand would like for you to have a repeat colonoscopy in 3-6 months with the golytely  prep. The schedule is only out through November and the patient would like to do this around 5-6 months. Please contact our office at [phone] to schedule this if you have not heard form our office before December. Recall will be placed**   YOU HAD AN ENDOSCOPIC PROCEDURE TODAY AT THE [facility]:   Refer to the procedure report that was given to you for any specific questions about what was found during the examination.  If the procedure report does not answer your questions, please call your gastroenterologist to clarify.  If you requested that your care partner not be given the details of your procedure findings, then the procedure report has been included in a sealed envelope for you to review at your convenience later.  YOU SHOULD EXPECT: Some feelings of bloating in the abdomen. Passage of more gas than usual.  Walking can help get rid of the air that was put into your GI tract during the procedure and reduce the bloating. If you had a lower endoscopy (such as a colonoscopy or flexible sigmoidoscopy) you may notice spotting of blood in your stool or on the toilet paper. If you underwent a bowel prep for your procedure, you may not have a normal bowel movement for a few days.  Please Note:  You might notice some irritation and congestion in your nose or some drainage.  This is from the oxygen used during your procedure.  There is no need for concern and it should clear up in a day or so.  SYMPTOMS TO REPORT IMMEDIATELY:  Following lower endoscopy (colonoscopy or flexible sigmoidoscopy):  Excessive amounts of blood in the stool  Significant tenderness or worsening of abdominal pains  Swelling of the abdomen that is new, acute  Fever of 100F  or higher  Following upper endoscopy (EGD)  Vomiting of blood or coffee ground material  New chest pain or pain under the shoulder blades  Painful or persistently difficult swallowing  New shortness of breath  Fever of 100F or higher  Black, tarry-looking stools  For urgent or emergent issues, a gastroenterologist can be reached at any hour by calling [phone]. Do not use MyChart messaging for urgent concerns.    DIET:  We do recommend a small meal at first, but then you may proceed to your regular diet.  Drink plenty of fluids but you should avoid alcoholic beverages for 24 hours.  ACTIVITY:  You should plan to take it easy for the rest of today and you should NOT DRIVE or use heavy machinery until tomorrow (because of the sedation medicines used during the test).    FOLLOW UP: Our staff will call the number listed on your records the next business day following your procedure.  We will call around 7:15- 8:00 am to check on you and address any questions or concerns that you may have regarding the information given to you following your procedure. If we do not reach you, we will leave a message.     If any biopsies were taken you will be contacted by phone or by letter within the next 1-3 weeks.  Please call us  at [phone] if you have not heard about the biopsies in 3 weeks.    SIGNATURES/CONFIDENTIALITY: You and/or your care  partner have signed paperwork which will be entered into your electronic medical record.  These signatures attest to the fact that that the information above on your After Visit Summary has been reviewed and is understood.  Full responsibility of the confidentiality of this discharge information lies with you and/or your care-partner.

## 2024-03-31 NOTE — Progress Notes (Signed)
 Patient reports pain is a 2 out of a 10. Previous pain was 7/10. 2 tablets of Levsin given and 5cc of Simethicone  given. Reports pain 2 out of 10 upon discharge.

## 2024-03-31 NOTE — Progress Notes (Signed)
 Pt's states no medical or surgical changes since previsit or office visit.

## 2024-03-31 NOTE — Op Note (Signed)
 [facility] Patient Name: [name] Procedure Date: 03/31/2024 3:00 PM MRN: [id number] Endoscopist: Victory L. Legrand , MD, [id number] Age: 63 Referring MD:  Date of Birth: [birth year] Gender: Female Account #: [id number] Procedure:                Upper GI endoscopy Indications:              Esophageal dysphagia, Abnormal UGI series                            (suggesting stricture in distal esophagus) Medicines:                Monitored Anesthesia Care Procedure:                Pre-Anesthesia Assessment:                           - Prior to the procedure, a History and Physical                            was performed, and patient medications and                            allergies were reviewed. The patient's tolerance of                            previous anesthesia was also reviewed. The risks                            and benefits of the procedure and the sedation                            options and risks were discussed with the patient.                            All questions were answered, and informed consent                            was obtained. Prior Anticoagulants: The patient has                            taken no anticoagulant or antiplatelet agents. ASA                            Grade Assessment: II - A patient with mild systemic                            disease. After reviewing the risks and benefits,                            the patient was deemed in satisfactory condition to                            undergo the procedure.  After obtaining informed consent, the endoscope was                            passed under direct vision. Throughout the                            procedure, the patient's blood pressure, pulse, and                            oxygen saturations were monitored continuously. The                            GIF [id number] #[id number] was introduced through the                            mouth, and  advanced to the second part of duodenum.                            The upper GI endoscopy was accomplished without                            difficulty. The patient tolerated the procedure                            well. Scope In: Scope Out: Findings:                 A mild Schatzki ring was found at the                            gastroesophageal junction. A TTS dilator was passed                            through the scope. Dilation with a 15-16.5-18 mm                            balloon dilator was performed to 18 mm. The                            dilation site was examined and showed no change.                           The exam of the esophagus was otherwise normal. No                            resistance passing the scope through the EGJ, and                            no mucosal abnormalities.                           The lower third of the esophagus was somewhat  tortuous.                           Evidence of prior gastric sleeve procedure.                           Patchy erythematous mucosa was found in the gastric                            antrum. Biopsies were taken with a cold forceps for                            histology. (Antrum and body in the same pathology                            jar to rule out H. pylori)                           The examined duodenum was normal. Complications:            No immediate complications. Estimated Blood Loss:     Estimated blood loss was minimal. Impression:               - Mild Schatzki ring. Dilated.                           - Tortuous esophagus.                           - Erythematous mucosa in the antrum. Biopsied.                           - Normal examined duodenum. Recommendation:           - Patient has a contact number available for                            emergencies. The signs and symptoms of potential                            delayed complications were discussed with the                             patient. Return to normal activities tomorrow.                            Written discharge instructions were provided to the                            patient.                           - Resume previous diet.                           - Continue present medications.                           -  Await pathology results.                           - Cut and chew food well with plenty of liquids. Rozlynn Lippold L. Legrand, MD 03/31/2024 3:37:59 PM This report has been signed electronically.

## 2024-04-01 ENCOUNTER — Telehealth: Payer: Self-pay

## 2024-04-01 NOTE — Telephone Encounter (Signed)
  Follow up Call-     03/31/2024    2:29 PM  Call back number  Post procedure Call Back phone  # [phone]  Permission to leave phone message Yes     Patient questions:  Do you have a fever, pain , or abdominal swelling? No. Pain Score  0 *  Have you tolerated food without any problems? Yes.    Have you been able to return to your normal activities? Yes.    Do you have any questions about your discharge instructions: Diet   No. Medications  No. Follow up visit  No.  Do you have questions or concerns about your Care? No.  Actions: * If pain score is 4 or above: No action needed, pain <4.

## 2024-04-05 ENCOUNTER — Ambulatory Visit: Payer: Self-pay | Admitting: Gastroenterology

## 2024-04-05 LAB — SURGICAL PATHOLOGY

## 2024-04-26 ENCOUNTER — Other Ambulatory Visit: Payer: Self-pay | Admitting: Family Medicine

## 2024-04-30 ENCOUNTER — Other Ambulatory Visit: Payer: Self-pay | Admitting: Family Medicine

## 2024-04-30 DIAGNOSIS — E7849 Other hyperlipidemia: Secondary | ICD-10-CM

## 2024-05-18 ENCOUNTER — Other Ambulatory Visit: Payer: Self-pay

## 2024-05-18 ENCOUNTER — Inpatient Hospital Stay (HOSPITAL_COMMUNITY)
Admission: EM | Admit: 2024-05-18 | Discharge: 2024-05-22 | DRG: 392 | Disposition: A | Discharge status: Home / Self Care | Attending: Family Medicine | Admitting: Family Medicine

## 2024-05-18 ENCOUNTER — Emergency Department (HOSPITAL_COMMUNITY)

## 2024-05-18 ENCOUNTER — Encounter (HOSPITAL_COMMUNITY): Payer: Self-pay

## 2024-05-18 DIAGNOSIS — Z6833 Body mass index (BMI) 33.0-33.9, adult: Secondary | ICD-10-CM

## 2024-05-18 DIAGNOSIS — D869 Sarcoidosis, unspecified: Secondary | ICD-10-CM | POA: Diagnosis present

## 2024-05-18 DIAGNOSIS — G894 Chronic pain syndrome: Secondary | ICD-10-CM | POA: Diagnosis present

## 2024-05-18 DIAGNOSIS — Z7951 Long term (current) use of inhaled steroids: Secondary | ICD-10-CM

## 2024-05-18 DIAGNOSIS — Z8249 Family history of ischemic heart disease and other diseases of the circulatory system: Secondary | ICD-10-CM

## 2024-05-18 DIAGNOSIS — F32A Depression, unspecified: Secondary | ICD-10-CM | POA: Diagnosis present

## 2024-05-18 DIAGNOSIS — K219 Gastro-esophageal reflux disease without esophagitis: Secondary | ICD-10-CM | POA: Diagnosis present

## 2024-05-18 DIAGNOSIS — Z8419 Family history of other disorders of kidney and ureter: Secondary | ICD-10-CM | POA: Diagnosis not present

## 2024-05-18 DIAGNOSIS — K2289 Other specified disease of esophagus: Secondary | ICD-10-CM | POA: Diagnosis present

## 2024-05-18 DIAGNOSIS — Z808 Family history of malignant neoplasm of other organs or systems: Secondary | ICD-10-CM

## 2024-05-18 DIAGNOSIS — E876 Hypokalemia: Secondary | ICD-10-CM | POA: Diagnosis present

## 2024-05-18 DIAGNOSIS — Z8269 Family history of other diseases of the musculoskeletal system and connective tissue: Secondary | ICD-10-CM

## 2024-05-18 DIAGNOSIS — Z23 Encounter for immunization: Secondary | ICD-10-CM | POA: Diagnosis present

## 2024-05-18 DIAGNOSIS — E669 Obesity, unspecified: Secondary | ICD-10-CM | POA: Diagnosis present

## 2024-05-18 DIAGNOSIS — E559 Vitamin D deficiency, unspecified: Secondary | ICD-10-CM | POA: Diagnosis present

## 2024-05-18 DIAGNOSIS — Z886 Allergy status to analgesic agent status: Secondary | ICD-10-CM | POA: Diagnosis not present

## 2024-05-18 DIAGNOSIS — Z83438 Family history of other disorder of lipoprotein metabolism and other lipidemia: Secondary | ICD-10-CM

## 2024-05-18 DIAGNOSIS — F419 Anxiety disorder, unspecified: Secondary | ICD-10-CM | POA: Diagnosis present

## 2024-05-18 DIAGNOSIS — K760 Fatty (change of) liver, not elsewhere classified: Secondary | ICD-10-CM | POA: Diagnosis present

## 2024-05-18 DIAGNOSIS — F17210 Nicotine dependence, cigarettes, uncomplicated: Secondary | ICD-10-CM | POA: Diagnosis present

## 2024-05-18 DIAGNOSIS — D860 Sarcoidosis of lung: Secondary | ICD-10-CM | POA: Diagnosis present

## 2024-05-18 DIAGNOSIS — K5900 Constipation, unspecified: Secondary | ICD-10-CM | POA: Diagnosis present

## 2024-05-18 DIAGNOSIS — E7800 Pure hypercholesterolemia, unspecified: Secondary | ICD-10-CM | POA: Diagnosis present

## 2024-05-18 DIAGNOSIS — Z9884 Bariatric surgery status: Secondary | ICD-10-CM | POA: Diagnosis not present

## 2024-05-18 DIAGNOSIS — Z79899 Other long term (current) drug therapy: Secondary | ICD-10-CM

## 2024-05-18 DIAGNOSIS — K3189 Other diseases of stomach and duodenum: Secondary | ICD-10-CM | POA: Diagnosis not present

## 2024-05-18 DIAGNOSIS — R1013 Epigastric pain: Secondary | ICD-10-CM

## 2024-05-18 DIAGNOSIS — R7989 Other specified abnormal findings of blood chemistry: Secondary | ICD-10-CM | POA: Diagnosis not present

## 2024-05-18 DIAGNOSIS — R1010 Upper abdominal pain, unspecified: Principal | ICD-10-CM | POA: Diagnosis present

## 2024-05-18 DIAGNOSIS — R112 Nausea with vomiting, unspecified: Secondary | ICD-10-CM | POA: Diagnosis not present

## 2024-05-18 DIAGNOSIS — K2970 Gastritis, unspecified, without bleeding: Secondary | ICD-10-CM | POA: Diagnosis present

## 2024-05-18 DIAGNOSIS — I10 Essential (primary) hypertension: Secondary | ICD-10-CM | POA: Diagnosis present

## 2024-05-18 LAB — CBC
HCT: 36.7 % (ref 36.0–46.0)
Hemoglobin: 12.8 g/dL (ref 12.0–15.0)
MCH: 34 pg (ref 26.0–34.0)
MCHC: 34.9 g/dL (ref 30.0–36.0)
MCV: 97.3 fL (ref 80.0–100.0)
Platelets: 281 K/uL (ref 150–400)
RBC: 3.77 MIL/uL — ABNORMAL LOW (ref 3.87–5.11)
RDW: 12.7 % (ref 11.5–15.5)
WBC: 4.8 K/uL (ref 4.0–10.5)
nRBC: 0 % (ref 0.0–0.2)

## 2024-05-18 LAB — COMPREHENSIVE METABOLIC PANEL WITH GFR
ALT: 98 U/L — ABNORMAL HIGH (ref 0–44)
AST: 258 U/L — ABNORMAL HIGH (ref 15–41)
Albumin: 3.1 g/dL — ABNORMAL LOW (ref 3.5–5.0)
Alkaline Phosphatase: 191 U/L — ABNORMAL HIGH (ref 38–126)
Anion gap: 16 — ABNORMAL HIGH (ref 5–15)
BUN: <5 mg/dL — ABNORMAL LOW (ref 8–23)
CO2: 21 mmol/L — ABNORMAL LOW (ref 22–32)
Calcium: 8.8 mg/dL — ABNORMAL LOW (ref 8.9–10.3)
Chloride: 103 mmol/L (ref 98–111)
Creatinine, Ser: 0.42 mg/dL — ABNORMAL LOW (ref 0.44–1.00)
GFR, Estimated: >60 mL/min (ref >60)
Glucose, Bld: 89 mg/dL (ref 70–99)
Potassium: 2.7 mmol/L — CL (ref 3.5–5.1)
Sodium: 140 mmol/L (ref 135–145)
Total Bilirubin: 0.7 mg/dL (ref 0.0–1.2)
Total Protein: 7.1 g/dL (ref 6.5–8.1)

## 2024-05-18 LAB — HEPATITIS PANEL, ACUTE
HCV Ab: NONREACTIVE
Hep A IgM: NONREACTIVE
Hep B C IgM: NONREACTIVE
Hepatitis B Surface Ag: NONREACTIVE

## 2024-05-18 LAB — IRON AND TIBC
Iron: 104 ug/dL (ref 28–170)
Saturation Ratios: 56 % — ABNORMAL HIGH (ref 10.4–31.8)
TIBC: 186 ug/dL — ABNORMAL LOW (ref 250–450)
UIBC: 82 ug/dL

## 2024-05-18 LAB — FERRITIN: Ferritin: 491 ng/mL — ABNORMAL HIGH (ref 11–307)

## 2024-05-18 LAB — MAGNESIUM: Magnesium: 1.7 mg/dL (ref 1.7–2.4)

## 2024-05-18 LAB — LIPASE, BLOOD: Lipase: 12 U/L (ref 11–51)

## 2024-05-18 LAB — ACETAMINOPHEN LEVEL: Acetaminophen (Tylenol), Serum: <10 ug/mL — ABNORMAL LOW (ref 10–30)

## 2024-05-18 MED ORDER — PANTOPRAZOLE SODIUM 40 MG IV SOLR
40.0000 mg | Freq: Two times a day (BID) | INTRAVENOUS | Status: DC
  Administered 2024-05-18 – 2024-05-22 (×8): 40 mg via INTRAVENOUS
  Filled 2024-05-18 (×8): qty 10

## 2024-05-18 MED ORDER — INFLUENZA VIRUS VACC SPLIT PF (FLUZONE) 0.5 ML IM SUSY
0.5000 mL | PREFILLED_SYRINGE | INTRAMUSCULAR | Status: AC
  Administered 2024-05-19: 0.5 mL via INTRAMUSCULAR
  Filled 2024-05-18: qty 0.5

## 2024-05-18 MED ORDER — ENOXAPARIN SODIUM 30 MG/0.3ML IJ SOSY: 30.0000 mg | PREFILLED_SYRINGE | INTRAMUSCULAR | Status: DC

## 2024-05-18 MED ORDER — DOCUSATE SODIUM 100 MG PO CAPS
100.0000 mg | ORAL_CAPSULE | Freq: Two times a day (BID) | ORAL | Status: DC
  Administered 2024-05-18 – 2024-05-22 (×5): 100 mg via ORAL
  Filled 2024-05-18 (×7): qty 1

## 2024-05-18 MED ORDER — ENOXAPARIN SODIUM 40 MG/0.4ML IJ SOSY
40.0000 mg | PREFILLED_SYRINGE | INTRAMUSCULAR | Status: DC
  Administered 2024-05-18 – 2024-05-21 (×4): 40 mg via SUBCUTANEOUS
  Filled 2024-05-18 (×4): qty 0.4

## 2024-05-18 MED ORDER — HYDROMORPHONE HCL 1 MG/ML IJ SOLN
0.5000 mg | INTRAMUSCULAR | Status: DC | PRN
  Administered 2024-05-18 – 2024-05-22 (×23): 0.5 mg via INTRAVENOUS
  Filled 2024-05-18 (×25): qty 0.5

## 2024-05-18 MED ORDER — ACETAMINOPHEN 325 MG PO TABS
650.0000 mg | ORAL_TABLET | Freq: Four times a day (QID) | ORAL | Status: DC | PRN
  Administered 2024-05-19 – 2024-05-22 (×8): 650 mg via ORAL
  Filled 2024-05-18 (×8): qty 2

## 2024-05-18 MED ORDER — HYDROMORPHONE HCL 1 MG/ML IJ SOLN
0.5000 mg | Freq: Once | INTRAMUSCULAR | Status: AC
  Administered 2024-05-18: 0.5 mg via INTRAVENOUS
  Filled 2024-05-18: qty 0.5

## 2024-05-18 MED ORDER — ALBUTEROL SULFATE (2.5 MG/3ML) 0.083% IN NEBU
3.0000 mL | INHALATION_SOLUTION | Freq: Four times a day (QID) | RESPIRATORY_TRACT | Status: DC | PRN
Start: 2024-05-18 — End: 2024-05-22
  Administered 2024-05-19 – 2024-05-21 (×3): 3 mL via RESPIRATORY_TRACT
  Filled 2024-05-18 (×3): qty 3

## 2024-05-18 MED ORDER — IOHEXOL 300 MG/ML  SOLN
100.0000 mL | Freq: Once | INTRAMUSCULAR | Status: AC | PRN
  Administered 2024-05-18: 100 mL via INTRAVENOUS

## 2024-05-18 MED ORDER — ONDANSETRON HCL 4 MG/2ML IJ SOLN
4.0000 mg | Freq: Four times a day (QID) | INTRAMUSCULAR | Status: DC | PRN
  Administered 2024-05-18: 4 mg via INTRAVENOUS
  Filled 2024-05-18: qty 2

## 2024-05-18 MED ORDER — ACETAMINOPHEN 650 MG RE SUPP: 650.0000 mg | Freq: Four times a day (QID) | RECTAL | Status: DC | PRN

## 2024-05-18 MED ORDER — POTASSIUM CHLORIDE 10 MEQ/100ML IV SOLN
10.0000 meq | INTRAVENOUS | Status: AC
  Administered 2024-05-18 (×3): 10 meq via INTRAVENOUS
  Filled 2024-05-18 (×3): qty 100

## 2024-05-18 MED ORDER — FLUTICASONE FUROATE-VILANTEROL 100-25 MCG/ACT IN AEPB
1.0000 | INHALATION_SPRAY | Freq: Every day | RESPIRATORY_TRACT | Status: DC
  Administered 2024-05-19 – 2024-05-22 (×4): 1 via RESPIRATORY_TRACT
  Filled 2024-05-18: qty 28

## 2024-05-18 MED ORDER — POTASSIUM CHLORIDE 10 MEQ/100ML IV SOLN
10.0000 meq | INTRAVENOUS | Status: AC
  Administered 2024-05-18 (×4): 10 meq via INTRAVENOUS
  Filled 2024-05-18 (×2): qty 100

## 2024-05-18 MED ORDER — ONDANSETRON HCL 4 MG PO TABS: 4.0000 mg | ORAL_TABLET | Freq: Four times a day (QID) | ORAL | Status: DC | PRN

## 2024-05-18 MED ORDER — LACTATED RINGERS IV BOLUS
1000.0000 mL | Freq: Once | INTRAVENOUS | Status: AC
  Administered 2024-05-18: 1000 mL via INTRAVENOUS

## 2024-05-18 MED ORDER — POTASSIUM CHLORIDE 10 MEQ/100ML IV SOLN
10.0000 meq | INTRAVENOUS | Status: DC
  Filled 2024-05-18 (×3): qty 100

## 2024-05-18 MED ORDER — ONDANSETRON HCL 4 MG/2ML IJ SOLN
4.0000 mg | Freq: Once | INTRAMUSCULAR | Status: AC
  Administered 2024-05-18: 4 mg via INTRAVENOUS
  Filled 2024-05-18: qty 2

## 2024-05-18 MED ORDER — SODIUM CHLORIDE 0.9 % IV SOLN
INTRAVENOUS | Status: AC
Start: 2024-05-18 — End: 2024-05-19

## 2024-05-18 MED ORDER — MORPHINE SULFATE (PF) 4 MG/ML IV SOLN
4.0000 mg | Freq: Once | INTRAVENOUS | Status: AC
  Administered 2024-05-18: 4 mg via INTRAVENOUS
  Filled 2024-05-18: qty 1

## 2024-05-18 MED ORDER — PANTOPRAZOLE SODIUM 40 MG IV SOLR
40.0000 mg | Freq: Once | INTRAVENOUS | Status: AC
  Administered 2024-05-18: 40 mg via INTRAVENOUS
  Filled 2024-05-18: qty 10

## 2024-05-18 NOTE — ED Provider Notes (Signed)
 [place] EMERGENCY DEPARTMENT AT [facility] Provider Note   CSN: [id number] Arrival date & time: 05/18/24  9247     Patient presents with: Abdominal Pain   [name] is a 63 y.o. female.  Orts history of sleeve gastrectomy at [facility] in the past, has been having upper abdominal pain ongoing for the past several months, she had an endoscopy with esophageal dilatation and has some temporary improvement but has been having continued pain, she is here today specifically because she has been having nausea and vomiting for the past 2 days, she is able to keep down liquids but not able to keep down solids.  She is also having diffuse upper abdominal pain.  She denies fever or chills, has not been taking any medications at home.  She has history of cholecystectomy in the past as well.  She denies exacerbating relieving factors.    Abdominal Pain      Prior to Admission medications   Medication Sig Start Date End Date Taking? Authorizing Provider  acetaminophen  (TYLENOL ) 325 MG tablet Take 2 tablets (650 mg total) by mouth every 6 (six) hours as needed. 11/09/22   Elnor Jayson LABOR, DO  amLODipine  (NORVASC ) 10 MG tablet Take 1 tablet (10 mg total) by mouth daily. 04/26/24   Bacchus, Meade PEDLAR, FNP  atorvastatin  (LIPITOR) 80 MG tablet Take 1 tablet (80 mg total) by mouth daily. 04/30/24   Edman Meade PEDLAR, FNP  Blood Pressure Monitoring (BLOOD PRESSURE KIT) KIT Check your blood pressure 3 x a week 07/12/21   Paseda, Folashade R, FNP  Cholecalciferol (VITAMIN D3) 25 MCG (1000 UT) CAPS Take 1 capsule (1,000 Units total) by mouth daily. 02/07/22   Paseda, Folashade R, FNP  dicyclomine  (BENTYL ) 20 MG tablet Take 1 tablet (20 mg total) by mouth 2 (two) times daily. 10/27/23   Bacchus, Gloria Z, FNP  ezetimibe  (ZETIA ) 10 MG tablet TAKE ONE TABLET BY MOUTH ONCE DAILY. 08/29/23   Bacchus, Meade PEDLAR, FNP  fenofibrate  (TRICOR ) 48 MG tablet Take 1 tablet (48 mg total) by mouth daily. 10/27/23   Bacchus,  Gloria Z, FNP  fluticasone  (FLONASE ) 50 MCG/ACT nasal spray Place 2 sprays into both nostrils daily. 06/03/23   Bacchus, Gloria Z, FNP  HYDROcodone -acetaminophen  (NORCO/VICODIN) 5-325 MG tablet Take 1 tablet by mouth 5 (five) times daily as needed.    [provider]  ibuprofen  (ADVIL ) 800 MG tablet Take 1 tablet (800 mg total) by mouth every 8 (eight) hours as needed. 02/23/21   Kinsinger, Herlene Righter, MD  lisinopril  (ZESTRIL ) 5 MG tablet TAKE ONE TABLET BY MOUTH ONCE DAILY. 08/18/23   Bacchus, Gloria Z, FNP  Multiple Vitamins-Minerals (WOMENS 50+ MULTI VITAMIN/MIN PO) Take 1 tablet by mouth daily.    [provider]  ondansetron  (ZOFRAN -ODT) 8 MG disintegrating tablet Take 1 tablet (8 mg total) by mouth every 8 (eight) hours as needed for nausea or vomiting. 11/02/23   Bernard Drivers, MD  pantoprazole  (PROTONIX ) 40 MG tablet Take 1 tablet (40 mg total) by mouth 2 (two) times daily. 08/26/23   Freddi Hamilton, MD  potassium chloride  (KLOR-CON ) 10 MEQ tablet Take 1 tablet (10 mEq total) by mouth 2 (two) times daily. 09/18/23   Bacchus, Meade PEDLAR, FNP  potassium chloride  SA (KLOR-CON  M) 20 MEQ tablet One po bid x 4 days, then one po once a day 11/02/23   Steinl, Kevin, MD  RESTASIS 0.05 % ophthalmic emulsion 1 drop 2 (two) times daily. 12/03/21   [provider]  sucralfate  (CARAFATE ) 1 GM/10ML suspension TAKE 10 ML BY MOUTH FOUR TIMES DAILY WITH MEALS AND AT BEDTIME 01/30/24   Bacchus, Meade PEDLAR, FNP  traMADol  (ULTRAM ) 50 MG tablet Take 1 tablet (50 mg total) by mouth every 6 (six) hours as needed. 11/02/23   Steinl, Kevin, MD  Vitamin D , Ergocalciferol , (DRISDOL ) 1.25 MG (50000 UNIT) CAPS capsule Take 1 capsule (50,000 Units total) by mouth every 7 (seven) days. 06/03/23   Bacchus, Meade PEDLAR, FNP    Allergies: Aspirin    Review of Systems  Gastrointestinal:  Positive for abdominal pain.    Updated Vital Signs BP 116/74   Pulse 76   Temp 98.8 F (37.1 C) (Oral)   Resp 16   Ht  5' 2 (1.575 m)   Wt 83.5 kg   SpO2 96%   BMI 33.67 kg/m   Physical Exam Vitals and nursing note reviewed.  Constitutional:      General: She is not in acute distress.    Appearance: She is well-developed.  HENT:     Head: Normocephalic and atraumatic.  Eyes:     Extraocular Movements: Extraocular movements intact.     Conjunctiva/sclera: Conjunctivae normal.     Pupils: Pupils are equal, round, and reactive to light.  Cardiovascular:     Rate and Rhythm: Normal rate and regular rhythm.     Heart sounds: No murmur heard. Pulmonary:     Effort: Pulmonary effort is normal. No respiratory distress.     Breath sounds: Normal breath sounds.  Abdominal:     Palpations: Abdomen is soft.     Tenderness: There is abdominal tenderness in the right upper quadrant, epigastric area and left upper quadrant. There is no guarding or rebound.  Musculoskeletal:        General: No swelling.     Cervical back: Neck supple.  Skin:    General: Skin is warm and dry.     Capillary Refill: Capillary refill takes less than 2 seconds.  Neurological:     Mental Status: She is alert.  Psychiatric:        Mood and Affect: Mood normal.     (all labs ordered are listed, but only abnormal results are displayed) Labs Reviewed  CBC - Abnormal; Notable for the following components:      Result Value   RBC 3.77 (*)    All other components within normal limits  LIPASE, BLOOD  COMPREHENSIVE METABOLIC PANEL WITH GFR  URINALYSIS, ROUTINE W REFLEX MICROSCOPIC    EKG: None  Radiology: No results found.   Procedures   Medications Ordered in the ED  morphine  (PF) 4 MG/ML injection 4 mg (has no administration in time range)  ondansetron  (ZOFRAN ) injection 4 mg (has no administration in time range)                                    Medical Decision Making This patient presents to the ED for concern of abdominal pain with nausea and vomiting, this involves an extensive number of treatment  options, and is a complaint that carries with it a high risk of complications and morbidity.  The differential diagnosis includes gastritis, gastroenteritis, peptic ulcer disease, pancreatitis, esophageal stricture, lecture derangement, dehydration, bowel obstruction, other   Co morbidities that complicate the patient evaluation :   History of sleeve gastrectomy, proptosis, hypertension   Additional history obtained:  Additional history obtained from  EM[facility] External records from outside source obtained and reviewed including devious notes, labs, imaging   Lab Tests:  I Ordered, and personally interpreted labs.  The pertinent results include: Potassium is 2.7, BUN and creatinine are normal, AST ALT and alk phos are elevated, anion gap is 16 and CO2 is 21, normal glucose, lipase is normal, CBC reassuring   Imaging Studies ordered:  I ordered imaging studies including CT abdomen pelvis with contrast which shows no acute intra-abdominal process but does show fatty liver, prior cholecystectomy and evidence of prior gastric sleeve surgery  I independently visualized and interpreted imaging within scope of identifying emergent findings  I agree with the radiologist interpretation    Consultations Obtained:  I requested consultation with the gastroenterologist Dr. Eartha,  and discussed lab and imaging findings as well as pertinent plan - they recommend: Keep patient n.p.o., they will see patient, admit to hospital service.   Problem List / ED Course / Critical interventions / Medication management  Abdominal pain with nausea and vomiting-patient has labs showing hypokalemia 2.7 normal magnesium , potassium is being repleted, given morphine  without relief, so given Dilaudid  with some improvement in symptoms.  She is also given Zofran  and IV fluids, she started to feel a bit better but still has some pain and nausea.  She has not been noted to keep anything down at home, has not had anything to  eat or drink today.  Consulted with GI given that she also had elevated LFTs which is new for her.  They recommended keeping patient n.p.o. and they will see her, will admit to hospitalist service.  Consult with hospitalist service as well who admit patient.  Patient is agreeable to plan of care and discharge.  Regarding patient's transaminitis, denies alcohol  use, denies taking any Tylenol  recently, she has had a cholecystectomy, bilirubin is normal, acute hepatitis panel ordered and Tylenol  level. I ordered medication including as above Reevaluation of the patient after these medicines showed that the patient improved I have reviewed the patients home medicines and have made adjustments as needed      Amount and/or Complexity of Data Reviewed Labs: ordered. Radiology: ordered.  Risk Prescription drug management. Decision regarding hospitalization.        Final diagnoses:  None    ED Discharge Orders     None          [name] 05/18/24 1304    Suzette Pac, MD 05/20/24 1200

## 2024-05-18 NOTE — TOC CM/SW Note (Signed)
   Transition of Care [facility]) - Inpatient Brief Assessment   Patient Details  Name: [name] MRN: [id number] Date of Birth: 03-17-1961  Transition of [facility] CM/SW Contact:    Noreen KATHEE Pinal, LCSWA Phone Number: 05/18/2024, 1:01 PM   Clinical Narrative:  Inpatient Care Management (ICM) has reviewed patient and no other ICM needs have been identified at this time. We will continue to monitor patient advancement through interdisciplinary progression rounds. If new patient transition needs arise, please place a ICM consult.  Transition of Care Asessment: Insurance and Status: Insurance coverage has been reviewed Patient has primary care physician: Yes Home environment has been reviewed: Single Family Home Prior level of function:: Independent Prior/Current Home Services: No current home services Social Drivers of Health Review: SDOH reviewed no interventions necessary Readmission risk has been reviewed: Yes Transition of care needs: no transition of care needs at this time

## 2024-05-18 NOTE — ED Triage Notes (Signed)
 Pt c/o abdominal pain with vomiting for over a month. Pt states she was admitted over a month ago to [facility] where they diagnosed her bypass hole was closing up and stretched it but symptoms have not improved. Pt states pain is worse today.

## 2024-05-18 NOTE — Plan of Care (Signed)

## 2024-05-18 NOTE — Consult Note (Signed)
 Gastroenterology Consult   Referring Provider: No ref. provider found Primary Care Physician:  Edman Meade PEDLAR, FNP Primary Gastroenterologist: Dr. Victory Brand, [facility]  Patient ID: [name]; [id number]; April 30, 1961   Admit date: 05/18/2024  LOS: 0 days   Date of Consultation: 05/18/2024  Reason for Consultation: Elevated LFTs, abdominal pain, vomiting    History of Present Illness   [name] is a 63 y.o. female, patient of Dr. Victory Brand with [facility], with pulmonary sarcoidosis, hypertension, hyperlipidemia, GERD, depression, anxiety, previous gastric sleeve (2020), hepatomegaly with hepatic steatosis, history of etoh abuse presenting with complaints of abdominal pain and vomiting for 5 months.  ED course: Lipase 12, potassium 2.7, creatinine 0.42, total bilirubin 0.7, alkaline phosphatase 191, AST 258, ALT 98, white blood cell count 4.8, hemoglobin 12.8, platelets 281, magnesium  1.7, acetaminophen  less than 10, acute hepatitis panel pending.  March 2025: LFTs normal.  GI consult:  Recently evaluated at [facility] for 3 to 35-month history of issues with swallowing, food getting stuck and bringing it back up.  She had sleeve gastrectomy (2020). She weighed over 300 pounds at that time.  She was seen by her surgeon, Dr. Stevie, for the symptoms and performed upper GI series in May 2025.  This showed findings suspicious for distal esophageal stricture.  Also had possible small ulcer in the gastric antrum, moderate esophageal dysmotility.  Has been on pantoprazole  40 mg daily for about a year.  Recently started on Carafate  suspension.  Reported 20 pound weight loss recently.  Current CT imaging showing postsurgical changes of cholecystectomy, gastric sleeve but nothing to explain her symptoms.  She presents today because of persistent vomiting and epigastric pain. She is unable to keep anything down. She tells me that these symptoms have been ongoing now for 5  months. She has vomiting every day. She is consuming mostly soups. She feels like food, liquid, medication gets to the epigastric area and sits, and within 10-15 minutes she starts vomiting. She has pain in epigastric region. She reports BMs normal. No melena, brbpr. Epigastric pain is intermittent. Worse with food. She has heartburn. On pantoprazole  for one year but does not help BUT this is no longer listed on her medication list. She reports that she has lost 30 pounds in the past 10 months.   She reports she has history of elevated LFTs intermittently. She denies NSAIDs/ASA, illicit drugs, herbal medications, supplements, energy drinks. She takes a MVI daily.   She reports history of heavy etoh use up until 3 years ago. She states her last etoh use was at a party two weeks ago and she drank about 5-6 shots of liquor.   EGD March 31, 2024: - Mild Schatzki ring status post dilation - Tortuous esophagus - Erythematous mucosa in the antrum - Normal examined duodenum - Evidence of prior gastric sleeve procedure -Gastric biopsy with features of both chemical/reactive gastropathy and mild chronic inactive gastritis, negative for Hpylori  Colonoscopy March 31, 2024: -Preparation of colon poor, - Stool in the entire examined colon - Repeat colonoscopy in 3 to 6 months  UGI 12/2023:  IMPRESSION: 1. A swallowed 13 mm barium tablet did not pass beyond the level of the distal esophagus despite a prolonged period of observation, and despite the patient taking additional swallows of water and barium contrast. Findings are suspicious for the presence of a distal esophageal stricture and endoscopy should be considered for further evaluation. 2. Possible small ulcer within the gastric antrum. Endoscopy should  be considered. 3. Moderate esophageal dysmotility with tertiary contractions.   CT scan of the abdomen and pelvis with contrast in 10/2023:  IMPRESSION: 1. Mild peripancreatic stranding about  the pancreatic uncinate process and body, which may reflect acute interstitial pancreatitis. Differential also includes primary pancreatic neoplasm. Recommend consultation to gastroenterology. No peripancreatic fluid collection. 2. Hepatic steatosis. 3. Nonobstructing punctate upper pole right renal stones. 4. Diffuse heterogeneous sclerosis of the left iliac bone and proximal femur, which is nonspecific, however can be seen in the setting of Paget's disease. 5.  Aortic Atherosclerosis (ICD10-I70.0).  MR abdomen with and without contrast March 11, 2024: IMPRESSION: 1. Unremarkable MRI appearance of the pancreas. Specifically, no mass or signal abnormality of the uncinate. Previously seen acute inflammatory findings have resolved. 2. Hepatomegaly and profound hepatic steatosis. 3. Status post cholecystectomy.   Prior to Admission medications   Medication Sig Start Date End Date Taking? Authorizing Provider  albuterol  (VENTOLIN  HFA) 108 (90 Base) MCG/ACT inhaler 2 puffs every 4 (four) hours as needed for wheezing or shortness of breath. 02/04/24  Yes [provider]  amLODipine  (NORVASC ) 10 MG tablet Take 1 tablet (10 mg total) by mouth daily. 04/26/24  Yes Bacchus, Meade PEDLAR, FNP  atorvastatin  (LIPITOR) 80 MG tablet Take 1 tablet (80 mg total) by mouth daily. 04/30/24  Yes Bacchus, Gloria Z, FNP  budesonide-formoterol (SYMBICORT) 80-4.5 MCG/ACT inhaler Inhale 2 puffs into the lungs 2 (two) times daily. 02/04/24  Yes [provider]  Cholecalciferol (VITAMIN D3) 25 MCG (1000 UT) CAPS Take 1 capsule (1,000 Units total) by mouth daily. 02/07/22  Yes Paseda, Folashade R, FNP  ezetimibe  (ZETIA ) 10 MG tablet TAKE ONE TABLET BY MOUTH ONCE DAILY. 08/29/23  Yes Bacchus, Meade PEDLAR, FNP  fenofibrate  (TRICOR ) 48 MG tablet Take 1 tablet (48 mg total) by mouth daily. 10/27/23  Yes Bacchus, Meade PEDLAR, FNP  fluticasone  (FLONASE ) 50 MCG/ACT nasal spray Place 2 sprays into both nostrils daily.  06/03/23  Yes Bacchus, Meade PEDLAR, FNP  HYDROcodone -acetaminophen  (NORCO) 7.5-325 MG tablet Take 1 tablet by mouth 5 (five) times daily as needed. 05/09/24  Yes [provider]  lisinopril  (ZESTRIL ) 5 MG tablet TAKE ONE TABLET BY MOUTH ONCE DAILY. 08/18/23  Yes Bacchus, Meade PEDLAR, FNP  ondansetron  (ZOFRAN -ODT) 8 MG disintegrating tablet Take 1 tablet (8 mg total) by mouth every 8 (eight) hours as needed for nausea or vomiting. 11/02/23  Yes Bernard Drivers, MD  potassium chloride  (KLOR-CON ) 10 MEQ tablet Take 1 tablet (10 mEq total) by mouth 2 (two) times daily. Patient taking differently: Take 10 mEq by mouth 2 (two) times daily. 09/18/23  Yes Bacchus, Meade PEDLAR, FNP  RESTASIS 0.05 % ophthalmic emulsion 1 drop 2 (two) times daily. 12/03/21  Yes [provider]  sucralfate  (CARAFATE ) 1 GM/10ML suspension Take 1 g by mouth See admin instructions. Take 10 mls 1g total) by mouth 4 times daily before meals and nightly 04/29/24 04/29/25 Yes [provider]  Blood Pressure Monitoring (BLOOD PRESSURE KIT) KIT Check your blood pressure 3 x a week 07/12/21   Paseda, Folashade R, FNP    Current Facility-Administered Medications  Medication Dose Route Frequency Provider Last Rate Last Admin   0.9 %  sodium chloride  infusion   Intravenous Continuous Leotis Bogus, MD 75 mL/hr at 05/18/24 1500 New Bag at 05/18/24 1500   acetaminophen  (TYLENOL ) tablet 650 mg  650 mg Oral Q6H PRN Leotis Bogus, MD       Or   acetaminophen  (TYLENOL ) suppository 650 mg  650 mg Rectal Q6H PRN Leotis Bogus, MD       albuterol  (PROVENTIL ) (2.5 MG/3ML) 0.083% nebulizer solution 3 mL  3 mL Inhalation Q6H PRN Leotis Bogus, MD       docusate sodium  (COLACE) capsule 100 mg  100 mg Oral BID Leotis Bogus, MD       enoxaparin  (LOVENOX ) injection 40 mg  40 mg Subcutaneous Q24H Tanda Dempsey SAUNDERS, [facility]       fluticasone  furoate-vilanterol (BREO ELLIPTA) 100-25 MCG/ACT 1 puff  1 puff Inhalation Daily Leotis Bogus, MD        HYDROmorphone  (DILAUDID ) injection 0.5 mg  0.5 mg Intravenous Q4H PRN Leotis Bogus, MD   0.5 mg at 05/18/24 1458   [START ON 05/19/2024] influenza vac split trivalent PF (FLUZONE) injection 0.5 mL  0.5 mL Intramuscular Tomorrow-1000 Leotis Bogus, MD       ondansetron  (ZOFRAN ) tablet 4 mg  4 mg Oral Q6H PRN Leotis Bogus, MD       Or   ondansetron  (ZOFRAN ) injection 4 mg  4 mg Intravenous Q6H PRN Leotis Bogus, MD   4 mg at 05/18/24 1458   potassium chloride  10 mEq in 100 mL IVPB  10 mEq Intravenous Q2H Madueme, Elvira C, RPH        Allergies as of 05/18/2024 - Review Complete 05/18/2024  Allergen Reaction Noted   Aspirin Hives 06/26/2009    Past Medical History:  Diagnosis Date   Anemia    Anxiety    Arthritis    back, hands , knees    Chest pain 2010   due to sarcoidosis- per pt.    Chronic pain    Depression    GERD (gastroesophageal reflux disease)    Hypercholesterolemia    Hypertension    Pneumonia    sarcoidosis- in remission    Pulmonary sarcoidosis    Treatment with Prednisone     Past Surgical History:  Procedure Laterality Date   ABDOMINAL HYSTERECTOMY     APPENDECTOMY     CHOLECYSTECTOMY     EXCISION MASS ABDOMINAL N/A 03/20/2020   Procedure: Excision of anterior abdominal wall fat necrosis;  Surgeon: Elisabeth Craig RAMAN, MD;  Location:  SURGERY CENTER;  Service: Plastics;  Laterality: N/A;   HERNIA REPAIR     INCISIONAL HERNIA REPAIR N/A 07/01/2019   Procedure: OPEN INCISIONAL HERNIA REPAIR WITH MESH;  Surgeon: Kinsinger, Herlene Righter, MD;  Location: [facility] OR;  Service: General;  Laterality: N/A;   INCISIONAL HERNIA REPAIR N/A 02/20/2021   Procedure: LAPAROSCOPIC RECURRENT INCISIONAL HERNIA REPAIR WITH MESH;  Surgeon: Stevie Herlene Righter, MD;  Location: [facility] ORS;  Service: General;  Laterality: N/A;   INSERTION OF MESH N/A 07/01/2019   Procedure: Insertion Of Mesh;  Surgeon: Kinsinger, Herlene Righter, MD;  Location: [facility] OR;  Service: General;  Laterality:  N/A;   LAPAROSCOPIC GASTRIC SLEEVE RESECTION N/A 02/15/2019   Procedure: LAPAROSCOPIC GASTRIC SLEEVE RESECTION, UPPER ENDO, ERAS Pathway, Hiatal Hernia Repair;  Surgeon: Kinsinger, Herlene Righter, MD;  Location: [facility] ORS;  Service: General;  Laterality: N/A;   LAPAROSCOPY N/A 02/17/2019   Procedure: LAPAROSCOPY DIAGNOSTIC CONVERTED TO OPEN, EVACUATION OF HEMATOMA AND LIGATION OF VESSEL;  Surgeon: Kinsinger, Herlene Righter, MD;  Location: [facility] OR[facility];  Service: General;  Laterality: N/A;   LAPAROSCOPY N/A 07/01/2019   Procedure: Laparoscopy Diagnostic;  Surgeon: Stevie Herlene Righter, MD;  Location: [facility] OR;  Service: General;  Laterality: N/A;   SMALL INTESTINE SURGERY      Family History  Problem Relation Age  of Onset   Hypertension Mother    Hyperlipidemia Mother    Lupus Father    Brain cancer Sister        2020   Heart disease Brother    Heart attack Brother    Kidney disease Maternal Grandmother    Lung cancer Neg Hx     Social History   Socioeconomic History   Marital status: Divorced    Spouse name: Not on file   Number of children: 2   Years of education: 12th   Highest education level: Some college, no degree  Occupational History   Occupation: foster parent  Tobacco Use   Smoking status: Every Day    Current packs/day: 0.30    Average packs/day: 0.2 packs/day for 30.4 years (6.1 ttl pk-yrs)    Types: Cigarettes    Start date: 11/1991    Last attempt to quit: 11/2021    Passive exposure: Current   Smokeless tobacco: Never   Tobacco comments:    Started smoking 01/05/24  Vaping Use   Vaping status: Never Used  Substance and Sexual Activity   Alcohol  use: Not Currently    Comment: drank heavily until 2022. last etoh 04/2024 (5-6 shots at a party)   Drug use: No   Sexual activity: Yes    Birth control/protection: Surgical  Other Topics Concern   Not on file  Social History Narrative   does regular exercise. Three times a week   Social Drivers of Corporate investment banker  Strain: Low Risk  (01/26/2024)   Overall Financial Resource Strain (CARDIA)    Difficulty of Paying Living Expenses: Not very hard  Food Insecurity: No Food Insecurity (05/18/2024)   Hunger Vital Sign    Worried About Running Out of Food in the Last Year: Never true    Ran Out of Food in the Last Year: Never true  Transportation Needs: No Transportation Needs (05/18/2024)   PRAPARE - Administrator, Civil Service (Medical): No    Lack of Transportation (Non-Medical): No  Physical Activity: Inactive (01/26/2024)   Exercise Vital Sign    Days of Exercise per Week: 0 days    Minutes of Exercise per Session: 0 min  Stress: No Stress Concern Present (01/26/2024)   [employer] of Occupational Health - Occupational Stress Questionnaire    Feeling of Stress: Not at all  Social Connections: Moderately Isolated (01/26/2024)   Social Connection and Isolation Panel    Frequency of Communication with Friends and Family: More than three times a week    Frequency of Social Gatherings with Friends and Family: More than three times a week    Attends Religious Services: More than 4 times per year    Active Member of [employer] or Organizations: No    Attends Banker Meetings: Never    Marital Status: Divorced  Catering manager Violence: Not At Risk (05/18/2024)   Humiliation, Afraid, Rape, and Kick questionnaire    Fear of Current or Ex-Partner: No    Emotionally Abused: No    Physically Abused: No    Sexually Abused: No     Review of System:   General: Negative for anorexia, fever, chills, fatigue, weakness. See hpi Eyes: Negative for vision changes.  ENT: Negative for hoarseness, difficulty swallowing , nasal congestion. CV: Negative for chest pain, angina, palpitations, dyspnea on exertion, peripheral edema.  Respiratory: Negative for dyspnea at rest, dyspnea on exertion, cough, sputum, wheezing.  GI: See history of present illness.  GU:  Negative for dysuria, hematuria,  urinary incontinence, urinary frequency, nocturnal urination.  MS: Negative for joint pain, low back pain.  Derm: Negative for rash or itching.  Neuro: Negative for weakness, abnormal sensation, seizure, frequent headaches, memory loss, confusion.  Psych: Negative for anxiety, depression, suicidal ideation, hallucinations.  Endo: see hpi Heme: Negative for bruising or bleeding. Allergy: Negative for rash or hives.      Physical Examination:   Vital signs in last 24 hours: Temp:  [98.2 F (36.8 C)-98.8 F (37.1 C)] 98.2 F (36.8 C) (10/07 1423) Pulse Rate:  [69-76] 71 (10/07 1423) Resp:  [16-18] 18 (10/07 1423) BP: (106-117)/(68-74) 117/72 (10/07 1423) SpO2:  [91 %-96 %] 94 % (10/07 1423) Weight:  [83.5 kg] 83.5 kg (10/07 0818) Last BM Date : 05/17/24  General: Well-nourished, well-developed in no acute distress.  Head: Normocephalic, atraumatic.   Eyes: Conjunctiva pink, no icterus. Mouth: Oropharyngeal mucosa moist and pink , no lesions erythema or exudate. Neck: Supple without thyromegaly, masses, or lymphadenopathy.  Lungs: Clear to auscultation bilaterally.  Heart: Regular rate and rhythm, no murmurs rubs or gallops.  Abdomen: Bowel sounds are normal,   nondistended, no hepatosplenomegaly or masses, no abdominal bruits or hernia , no rebound or guarding.  Moderate epigastric tenderness Rectal: not performed Extremities: No lower extremity edema, clubbing, deformity.  Neuro: Alert and oriented x 4 , grossly normal neurologically.  Skin: Warm and dry, no rash or jaundice.   Psych: Alert and cooperative, normal mood and affect.        Intake/Output from previous day: No intake/output data recorded. Intake/Output this shift: Total I/O In: 1000 [IV Piggyback:1000] Out: -   Lab Results:   CBC Recent Labs    05/18/24 0826  WBC 4.8  HGB 12.8  HCT 36.7  MCV 97.3  PLT 281   BMET Recent Labs    05/18/24 0826  NA 140  K 2.7*  CL 103  CO2 21*  GLUCOSE 89  BUN  <5*  CREATININE 0.42*  CALCIUM  8.8*   LFT Recent Labs    05/18/24 0826  BILITOT 0.7  ALKPHOS 191*  AST 258*  ALT 98*  PROT 7.1  ALBUMIN 3.1*    Lipase Recent Labs    05/18/24 0826  LIPASE 12    PT/INR No results for input(s): LABPROT, INR in the last 72 hours.   Hepatitis Panel No results for input(s): HEPBSAG, HCVAB, HEPAIGM, HEPBIGM in the last 72 hours.   Imaging Studies:   CT ABDOMEN PELVIS W CONTRAST Result Date: 05/18/2024 EXAM: CT ABDOMEN AND PELVIS WITH CONTRAST 05/18/2024 10:30:38 AM TECHNIQUE: CT of the abdomen and pelvis was performed with the administration of 100 mL of iohexol  (OMNIPAQUE ) 300 MG/ML solution. Multiplanar reformatted images are provided for review. Automated exposure control, iterative reconstruction, and/or weight-based adjustment of the mA/kV was utilized to reduce the radiation dose to as low as reasonably achievable. COMPARISON: MR 03/11/2024 and previous. CLINICAL HISTORY: Abdominal pain, acute, nonlocalized. Pt c/o abdominal pain with vomiting for over a month. Pt states she was admitted over a month ago to [facility] where they diagnosed her bypass hole was closing up and stretched it but symptoms have not improved. Pt states pain is worse today. FINDINGS: LOWER CHEST: Chronic linear scarring/atelectasis in the lung bases. LIVER: Fatty liver. GALLBLADDER AND BILE DUCTS: Cholecystectomy clips. No biliary ductal dilatation. SPLEEN: No acute abnormality. PANCREAS: No acute abnormality. ADRENAL GLANDS: No acute abnormality. KIDNEYS, URETERS AND BLADDER: 2.1 cm 7 hu left upper  pole renal cortical cyst as before. Right urolithiasis largest 3 mm peripherally in the upper pole collecting system. No hydronephrosis. Urinary bladder nondistended. No perinephric or periureteral stranding. Per consensus, no follow-up is needed for simple Bosniak type 1 and 2 renal cysts, unless the patient has a malignancy history or risk factors. GI AND BOWEL: Post gastric  sleeve surgery. Stomach demonstrates no acute abnormality. There is no bowel obstruction. PERITONEUM AND RETROPERITONEUM: No ascites. No free air. VASCULATURE: Moderate calcified aortoiliac atheromatous plaque without aneurysm. Aorta is normal in caliber. LYMPH NODES: No lymphadenopathy. REPRODUCTIVE ORGANS: Post hysterectomy. BONES AND SOFT TISSUES: Mild lumbar spondylitic changes L4-5 with suspected protrusion. Stable heterogeneous sclerosis in the left iliac wing and proximal femur since 2022. No acute osseous abnormality. No focal soft tissue abnormality. IMPRESSION: 1. No acute findings in the abdomen or pelvis. 2. Hepatic steatosis. 3. Nonobstructing right renal calculi, without  hydronephrosis. 4. Moderate calcified aortoiliac atherosclerosis without aneurysm. 5. Postsurgical changes of cholecystectomy, gastric sleeve, and hysterectomy. 6. Mild lumbar spondylosis at L4-L5 with suspected disc protrusion. 7. Chronic scarring or atelectasis at the lung bases. 8. Stable heterogeneous sclerosis in the left iliac wing and proximal femur since 2022. Electronically signed by: Dayne Hassell MD 05/18/2024 11:32 AM EDT RP Workstation: [id number]  [5 week]  Assessment:   63 y/o female patient of Dr. Victory Brand with [facility], with pulmonary sarcoidosis, hypertension, hyperlipidemia, GERD, depression, anxiety, previous gastric sleeve (2020), hepatomegaly with hepatic steatosis, history of etoh abuse presenting with complaints of epigastric pain, N/V ongoing for 5 months now. She was noted to have elevated LFTs.  Epigastric pain, N/V: Etiology not clear. Ongoing symptoms for 5 months, daily. Occurring with soups and medications now. UGI initially suggested distal esophageal stricture and possible small gastric antral ulcer as well as moderate esophageal dysmotility with tertiary contractions. EGD with esophageal dilation of mild Schatzki ring did not help. She was noted to have a tortuous esophagus and gastritis  but no ulcer. CTs also with some suggestion of mild peripancreatic stranding with possible acute interstitial pancreatitis in 10/2023 but follow up MRI abd with unremarkable pancreas. Current CT without explanation for symptoms. She admits to prior heavy etoh use up until 3 years ago but had 5-6 shots of liquor at party 2 weeks ago.  Her symptoms may be multifactorial. Cannot rule out pancreatitis given previous CT findings and mildly elevated lipase in the past. Symptoms could be exacerbated by esophageal motility disorder. Cannot rule out gastroparesis. It is not clear if the cause of her elevated LFTs are tied to her abdominal pain and vomiting.   Elevated LFTs: Intermittent elevation, normal in 10/2023. Now with AST two times ALT which could be due to etoh hepatitis. AP also elevated. Liver enlarged and fatty on imaging. She denies herbal or other otc supplements, illicit drug use, energy drinks, recent tattoos. Would recommend trending LFTs and if persistently elevated, would complete serologic work up. Limited work up now including iron/ferritin/acute hepatitis.  Plan:   Trend LFTs PPI BID Follow up acute hepatitis panel Clear liquids Replete potassium per attending. Refrain from etoh use. May benefit from relook EGD.    LOS: 0 days   We would like to thank you for the opportunity to participate in the care of [name].  Sonny RAMAN. Ezzard RIGGERS [facility] [phone] 10/7/20254:12 PM

## 2024-05-18 NOTE — ED Notes (Signed)
 Date and time results received: 05/18/24 0952   Test: K+ Critical Value: 2.7  Name of Provider Notified: Sherran Barks, [place]

## 2024-05-18 NOTE — H&P (Signed)
 History and Physical    [name] FMW:[id number] DOB: September 10, 1960 DOA: 05/18/2024  PCP: Edman Meade PEDLAR, FNP   Patient coming from:  Home  I have personally briefly reviewed patient's old medical records in [facility]  Chief Complaint: Abdominal pain, Intractable nausea and vomiting for 3 days.  HPI: [name] is a 63 y.o. female with medical history significant for sleeve gastrectomy at [facility] 3 years back, GERD, vitamin D  deficiency, hypertension, chronic pain syndrome, hyperlipidemia, presented in the ED with complaints of abdominal pain associated with nausea and vomiting for last 3 days.  Patient reports undergoing sleeve gastrectomy 3 years back at [facility], since then she has been dealing intermittently with abdominal pain,  nausea and vomiting.  She has followed up with gastroenterology for dysphagia and she underwent EGD with dilatation of the esophageal stricture recently.  She is unable to take anything down for last 3 days, denies any fever, sick contacts or recent travel.  ED Course: She was hemodynamically stable. Temp 98.2, HR 71, RR 18, BP 117/72, SpO2 94% on room air Labs include sodium 140, potassium 2.7, chloride 103, bicarb 21, glucose 89, BUN 5, creatinine 0.52, calcium  8.8, anion gap 16, magnesium  1.7, alkaline phosphate 191, albumin 3.1, lipase 12, AST 258, ALT 98, total protein 7.1, total bilirubin 0.7, WBC 4.8, hemoglobin 12.8, hematocrit 36.7, platelet 281, acetaminophen  level less than 10,  CTA/P: No acute findings in the abdomen or pelvis. Hepatic steatosis. Non-obstructing right renal calculi, without  hydronephrosis. Moderate calcified aortoiliac atherosclerosis without aneurysm.Postsurgical changes of cholecystectomy, gastric sleeve, and hysterectomy.  Review of Systems:  Review of Systems  Constitutional:  Positive for malaise/fatigue.  HENT: Negative.    Eyes: Negative.   Respiratory: Negative.    Cardiovascular: Negative.   Gastrointestinal:   Positive for abdominal pain, nausea and vomiting.  Genitourinary: Negative.   Musculoskeletal: Negative.   Skin: Negative.   Neurological: Negative.   Endo/Heme/Allergies: Negative.   Psychiatric/Behavioral: Negative.      Past Medical History:  Diagnosis Date   Anemia    Anxiety    Arthritis    back, hands , knees    Chest pain 2010   due to sarcoidosis- per pt.    Chronic pain    Depression    GERD (gastroesophageal reflux disease)    Hypercholesterolemia    Hypertension    Pneumonia    sarcoidosis- in remission    Pulmonary sarcoidosis    Treatment with Prednisone     Past Surgical History:  Procedure Laterality Date   ABDOMINAL HYSTERECTOMY     APPENDECTOMY     CHOLECYSTECTOMY     EXCISION MASS ABDOMINAL N/A 03/20/2020   Procedure: Excision of anterior abdominal wall fat necrosis;  Surgeon: Elisabeth Craig RAMAN, MD;  Location: [facility];  Service: Plastics;  Laterality: N/A;   HERNIA REPAIR     INCISIONAL HERNIA REPAIR N/A 07/01/2019   Procedure: OPEN INCISIONAL HERNIA REPAIR WITH MESH;  Surgeon: Kinsinger, Herlene Righter, MD;  Location: [facility] OR;  Service: General;  Laterality: N/A;   INCISIONAL HERNIA REPAIR N/A 02/20/2021   Procedure: LAPAROSCOPIC RECURRENT INCISIONAL HERNIA REPAIR WITH MESH;  Surgeon: Stevie Herlene Righter, MD;  Location: [facility] ORS;  Service: General;  Laterality: N/A;   INSERTION OF MESH N/A 07/01/2019   Procedure: Insertion Of Mesh;  Surgeon: Kinsinger, Herlene Righter, MD;  Location: [facility] OR;  Service: General;  Laterality: N/A;   LAPAROSCOPIC GASTRIC SLEEVE RESECTION N/A 02/15/2019   Procedure: LAPAROSCOPIC GASTRIC SLEEVE RESECTION, UPPER  ENDO, ERAS Pathway, Hiatal Hernia Repair;  Surgeon: Kinsinger, Herlene Righter, MD;  Location: [facility];  Service: General;  Laterality: N/A;   LAPAROSCOPY N/A 02/17/2019   Procedure: LAPAROSCOPY DIAGNOSTIC CONVERTED TO OPEN, EVACUATION OF HEMATOMA AND LIGATION OF VESSEL;  Surgeon: Kinsinger, Herlene Righter, MD;  Location: [facility];   Service: General;  Laterality: N/A;   LAPAROSCOPY N/A 07/01/2019   Procedure: Laparoscopy Diagnostic;  Surgeon: Stevie Herlene Righter, MD;  Location: [facility] OR;  Service: General;  Laterality: N/A;   SMALL INTESTINE SURGERY       reports that she has been smoking cigarettes. She started smoking about 32 years ago. She has a 6.1 pack-year smoking history. She has been exposed to tobacco smoke. She has never used smokeless tobacco. She reports that she does not currently use alcohol . She reports that she does not use drugs.  Allergies  Allergen Reactions   Aspirin Hives    Family History  Problem Relation Age of Onset   Hypertension Mother    Hyperlipidemia Mother    Lupus Father    Brain cancer Sister        2020   Heart disease Brother    Heart attack Brother    Kidney disease Maternal Grandmother    Lung cancer Neg Hx    Family history reviewed and not pertinent.  Prior to Admission medications   Medication Sig Start Date End Date Taking? Authorizing Provider  albuterol  (VENTOLIN  HFA) 108 (90 Base) MCG/ACT inhaler 2 puffs every 4 (four) hours as needed for wheezing or shortness of breath. 02/04/24  Yes [provider]  amLODipine  (NORVASC ) 10 MG tablet Take 1 tablet (10 mg total) by mouth daily. 04/26/24  Yes Bacchus, Meade PEDLAR, FNP  atorvastatin  (LIPITOR) 80 MG tablet Take 1 tablet (80 mg total) by mouth daily. 04/30/24  Yes Bacchus, Gloria Z, FNP  budesonide-formoterol (SYMBICORT) 80-4.5 MCG/ACT inhaler Inhale 2 puffs into the lungs 2 (two) times daily. 02/04/24  Yes [provider]  Cholecalciferol (VITAMIN D3) 25 MCG (1000 UT) CAPS Take 1 capsule (1,000 Units total) by mouth daily. 02/07/22  Yes Paseda, Folashade R, FNP  ezetimibe  (ZETIA ) 10 MG tablet TAKE ONE TABLET BY MOUTH ONCE DAILY. 08/29/23  Yes Bacchus, Meade PEDLAR, FNP  fenofibrate  (TRICOR ) 48 MG tablet Take 1 tablet (48 mg total) by mouth daily. 10/27/23  Yes Bacchus, Meade PEDLAR, FNP  fluticasone  (FLONASE ) 50  MCG/ACT nasal spray Place 2 sprays into both nostrils daily. 06/03/23  Yes Bacchus, Meade PEDLAR, FNP  HYDROcodone -acetaminophen  (NORCO) 7.5-325 MG tablet Take 1 tablet by mouth 5 (five) times daily as needed. 05/09/24  Yes [provider]  lisinopril  (ZESTRIL ) 5 MG tablet TAKE ONE TABLET BY MOUTH ONCE DAILY. 08/18/23  Yes Bacchus, Meade PEDLAR, FNP  ondansetron  (ZOFRAN -ODT) 8 MG disintegrating tablet Take 1 tablet (8 mg total) by mouth every 8 (eight) hours as needed for nausea or vomiting. 11/02/23  Yes Bernard Drivers, MD  potassium chloride  (KLOR-CON ) 10 MEQ tablet Take 1 tablet (10 mEq total) by mouth 2 (two) times daily. Patient taking differently: Take 10 mEq by mouth 2 (two) times daily. 09/18/23  Yes Bacchus, Meade PEDLAR, FNP  RESTASIS 0.05 % ophthalmic emulsion 1 drop 2 (two) times daily. 12/03/21  Yes [provider]  sucralfate  (CARAFATE ) 1 GM/10ML suspension Take 1 g by mouth See admin instructions. Take 10 mls 1g total) by mouth 4 times daily before meals and nightly 04/29/24 04/29/25 Yes [provider]  Blood Pressure Monitoring (BLOOD PRESSURE KIT)  KIT Check your blood pressure 3 x a week 07/12/21   Paseda, Folashade R, FNP    Physical Exam: Vitals:   05/18/24 1130 05/18/24 1145 05/18/24 1421 05/18/24 1423  BP: 115/68 106/68  117/72  Pulse: 69 76  71  Resp:    18  Temp:    98.2 F (36.8 C)  TempSrc:    Oral  SpO2: 94% 91%  94%  Weight:      Height:   5' 2 (1.575 m)     Constitutional: Appears comfortable, not in any acute distress. Vitals:   05/18/24 1130 05/18/24 1145 05/18/24 1421 05/18/24 1423  BP: 115/68 106/68  117/72  Pulse: 69 76  71  Resp:    18  Temp:    98.2 F (36.8 C)  TempSrc:    Oral  SpO2: 94% 91%  94%  Weight:      Height:   5' 2 (1.575 m)    Eyes: PERRL, lids and conjunctivae normal ENMT: Mucous membranes are moist. Posterior pharynx clear of any exudate or lesions. Neck: normal, supple, no masses, no thyromegaly Respiratory: CTA   bilaterally, no wheezing, no crackles. Normal respiratory effort. No accessory muscle use.  Cardiovascular: S1+ S2 , Regular rate and rhythm, no murmurs / rubs / gallops. No extremity edema.  Abdomen:Soft,  no tenderness, no masses palpated. No hepatosplenomegaly. Bowel sounds positive.  Musculoskeletal: no clubbing / cyanosis. No joint deformity upper and lower extremities. Good ROM, no contractures. Normal muscle tone.  Skin: no rashes, lesions, ulcers. No induration Neurologic: CN 2-12 grossly intact. Sensation intact, DTR normal. Strength 5/5 in all 4.  Psychiatric: Normal judgment and insight. Alert and oriented x 3. Normal mood.   Labs on Admission: I have personally reviewed following labs and imaging studies  CBC: Recent Labs  Lab 05/18/24 0826  WBC 4.8  HGB 12.8  HCT 36.7  MCV 97.3  PLT 281   Basic Metabolic Panel: Recent Labs  Lab 05/18/24 0826  NA 140  K 2.7*  CL 103  CO2 21*  GLUCOSE 89  BUN <5*  CREATININE 0.42*  CALCIUM  8.8*  MG 1.7   GFR: Estimated Creatinine Clearance: 73.1 mL/min (A) (by C-G formula based on SCr of 0.42 mg/dL (L)). Liver Function Tests: Recent Labs  Lab 05/18/24 0826  AST 258*  ALT 98*  ALKPHOS 191*  BILITOT 0.7  PROT 7.1  ALBUMIN 3.1*   Recent Labs  Lab 05/18/24 0826  LIPASE 12   No results for input(s): AMMONIA in the last 168 hours. Coagulation Profile: No results for input(s): INR, PROTIME in the last 168 hours. Cardiac Enzymes: No results for input(s): CKTOTAL, CKMB, CKMBINDEX, TROPONINI in the last 168 hours. BNP (last 3 results) No results for input(s): PROBNP in the last 8760 hours. HbA1C: No results for input(s): HGBA1C in the last 72 hours. CBG: No results for input(s): GLUCAP in the last 168 hours. Lipid Profile: No results for input(s): CHOL, HDL, LDLCALC, TRIG, CHOLHDL, LDLDIRECT in the last 72 hours. Thyroid  Function Tests: No results for input(s): TSH, T4TOTAL,  FREET4, T3FREE, THYROIDAB in the last 72 hours. Anemia Panel: No results for input(s): VITAMINB12, FOLATE, FERRITIN, TIBC, IRON, RETICCTPCT in the last 72 hours. Urine analysis:    Component Value Date/Time   COLORURINE YELLOW 11/02/2023 1027   APPEARANCEUR HAZY (A) 11/02/2023 1027   LABSPEC 1.005 11/02/2023 1027   PHURINE 6.0 11/02/2023 1027   GLUCOSEU NEGATIVE 11/02/2023 1027   HGBUR NEGATIVE 11/02/2023 1027   BILIRUBINUR  NEGATIVE 11/02/2023 1027   BILIRUBINUR NEG 11/14/2022 0904   KETONESUR NEGATIVE 11/02/2023 1027   PROTEINUR NEGATIVE 11/02/2023 1027   UROBILINOGEN 1.0 11/14/2022 0904   UROBILINOGEN 0.2 05/13/2010 1636   NITRITE NEGATIVE 11/02/2023 1027   LEUKOCYTESUR NEGATIVE 11/02/2023 1027    Radiological Exams on Admission: CT ABDOMEN PELVIS W CONTRAST Result Date: 05/18/2024 EXAM: CT ABDOMEN AND PELVIS WITH CONTRAST 05/18/2024 10:30:38 AM TECHNIQUE: CT of the abdomen and pelvis was performed with the administration of 100 mL of iohexol  (OMNIPAQUE ) 300 MG/ML solution. Multiplanar reformatted images are provided for review. Automated exposure control, iterative reconstruction, and/or weight-based adjustment of the mA/kV was utilized to reduce the radiation dose to as low as reasonably achievable. COMPARISON: MR 03/11/2024 and previous. CLINICAL HISTORY: Abdominal pain, acute, nonlocalized. Pt c/o abdominal pain with vomiting for over a month. Pt states she was admitted over a month ago to [facility] where they diagnosed her bypass hole was closing up and stretched it but symptoms have not improved. Pt states pain is worse today. FINDINGS: LOWER CHEST: Chronic linear scarring/atelectasis in the lung bases. LIVER: Fatty liver. GALLBLADDER AND BILE DUCTS: Cholecystectomy clips. No biliary ductal dilatation. SPLEEN: No acute abnormality. PANCREAS: No acute abnormality. ADRENAL GLANDS: No acute abnormality. KIDNEYS, URETERS AND BLADDER: 2.1 cm 7 hu left upper pole renal cortical  cyst as before. Right urolithiasis largest 3 mm peripherally in the upper pole collecting system. No hydronephrosis. Urinary bladder nondistended. No perinephric or periureteral stranding. Per consensus, no follow-up is needed for simple Bosniak type 1 and 2 renal cysts, unless the patient has a malignancy history or risk factors. GI AND BOWEL: Post gastric sleeve surgery. Stomach demonstrates no acute abnormality. There is no bowel obstruction. PERITONEUM AND RETROPERITONEUM: No ascites. No free air. VASCULATURE: Moderate calcified aortoiliac atheromatous plaque without aneurysm. Aorta is normal in caliber. LYMPH NODES: No lymphadenopathy. REPRODUCTIVE ORGANS: Post hysterectomy. BONES AND SOFT TISSUES: Mild lumbar spondylitic changes L4-5 with suspected protrusion. Stable heterogeneous sclerosis in the left iliac wing and proximal femur since 2022. No acute osseous abnormality. No focal soft tissue abnormality. IMPRESSION: 1. No acute findings in the abdomen or pelvis. 2. Hepatic steatosis. 3. Nonobstructing right renal calculi, without  hydronephrosis. 4. Moderate calcified aortoiliac atherosclerosis without aneurysm. 5. Postsurgical changes of cholecystectomy, gastric sleeve, and hysterectomy. 6. Mild lumbar spondylosis at L4-L5 with suspected disc protrusion. 7. Chronic scarring or atelectasis at the lung bases. 8. Stable heterogeneous sclerosis in the left iliac wing and proximal femur since 2022. Electronically signed by: Katheleen Faes MD 05/18/2024 11:32 AM EDT RP Workstation: [id number]    EKG: ordered, Please review.  Assessment/Plan Principal Problem:   Intractable nausea and vomiting Active Problems:   SARCOIDOSIS, PULMONARY   GASTROESOPHAGEAL REFLUX DISEASE   Constipation   Obesity   Hypertension   Vitamin D  deficiency  Intractable nausea and vomiting: Abdominal pain: Patient presented with abdominal pain associated with intractable nausea and vomiting for last few days. Patient  reports prior episode of pain, found to have esophageal stricture which was dilated few months back. CT abdomen and pelvis unremarkable for any acute event. Continue IV Zofran  as needed for nausea and vomiting. Continue IV Dilaudid  as needed for pain control. Continue NPO., IV fluid resuscitation. GI is consulted, Patient probably will need EGD.  Hypokalemia: Replaced.  Continue to monitor  GERD: Continue pantoprazole   Hypertension: Hold blood pressure medication as blood pressure is on soft side.  Hyperlipidemia: Hold Lipitor due to elevated liver enzymes  Elevated liver enzymes: Continue to  monitor LFTs.   Could be due to hepatic steatosis. Tylenol  level normal, acute hepatitis panel pending   DVT prophylaxis: Lovenox  Code Status: Full code Family Communication: No family at bedside Disposition Plan:   Status is: Inpatient Remains inpatient appropriate because: Admitted for intractable nausea and vomiting   Consults called: Gastroenterology Admission status: Inpatient   Darcel Dawley MD [facility]   If 7PM-7AM, please contact night-coverage [email or web address] Password [email or web address]  05/18/2024, 2:40 PM

## 2024-05-18 NOTE — ED Notes (Signed)
 Let Pt know we need a urine sample, pt said she wasn't able to give one at the moment wold let me know when she could.

## 2024-05-19 ENCOUNTER — Encounter (HOSPITAL_COMMUNITY): Payer: Self-pay | Admitting: Family Medicine

## 2024-05-19 ENCOUNTER — Inpatient Hospital Stay (HOSPITAL_COMMUNITY): Admitting: Certified Registered"

## 2024-05-19 ENCOUNTER — Encounter (HOSPITAL_COMMUNITY)
Admission: EM | Disposition: A | Discharge status: Home / Self Care | Payer: Self-pay | Source: Home / Self Care | Attending: Family Medicine

## 2024-05-19 DIAGNOSIS — K3189 Other diseases of stomach and duodenum: Secondary | ICD-10-CM

## 2024-05-19 DIAGNOSIS — F17210 Nicotine dependence, cigarettes, uncomplicated: Secondary | ICD-10-CM

## 2024-05-19 DIAGNOSIS — R112 Nausea with vomiting, unspecified: Secondary | ICD-10-CM | POA: Diagnosis not present

## 2024-05-19 DIAGNOSIS — K2289 Other specified disease of esophagus: Secondary | ICD-10-CM

## 2024-05-19 DIAGNOSIS — K2970 Gastritis, unspecified, without bleeding: Principal | ICD-10-CM

## 2024-05-19 DIAGNOSIS — I10 Essential (primary) hypertension: Secondary | ICD-10-CM

## 2024-05-19 DIAGNOSIS — F32A Depression, unspecified: Secondary | ICD-10-CM

## 2024-05-19 DIAGNOSIS — Z9884 Bariatric surgery status: Secondary | ICD-10-CM

## 2024-05-19 HISTORY — PX: ESOPHAGOGASTRODUODENOSCOPY: SHX5428

## 2024-05-19 LAB — MAGNESIUM: Magnesium: 1.7 mg/dL (ref 1.7–2.4)

## 2024-05-19 LAB — COMPREHENSIVE METABOLIC PANEL WITH GFR
ALT: 93 U/L — ABNORMAL HIGH (ref 0–44)
AST: 234 U/L — ABNORMAL HIGH (ref 15–41)
Albumin: 2.8 g/dL — ABNORMAL LOW (ref 3.5–5.0)
Alkaline Phosphatase: 170 U/L — ABNORMAL HIGH (ref 38–126)
Anion gap: 12 (ref 5–15)
BUN: <5 mg/dL — ABNORMAL LOW (ref 8–23)
CO2: 21 mmol/L — ABNORMAL LOW (ref 22–32)
Calcium: 8.3 mg/dL — ABNORMAL LOW (ref 8.9–10.3)
Chloride: 105 mmol/L (ref 98–111)
Creatinine, Ser: 0.48 mg/dL (ref 0.44–1.00)
GFR, Estimated: >60 mL/min (ref >60)
Glucose, Bld: 76 mg/dL (ref 70–99)
Potassium: 3.6 mmol/L (ref 3.5–5.1)
Sodium: 138 mmol/L (ref 135–145)
Total Bilirubin: 0.9 mg/dL (ref 0.0–1.2)
Total Protein: 6.2 g/dL — ABNORMAL LOW (ref 6.5–8.1)

## 2024-05-19 LAB — CBC
HCT: 33.2 % — ABNORMAL LOW (ref 36.0–46.0)
Hemoglobin: 11.2 g/dL — ABNORMAL LOW (ref 12.0–15.0)
MCH: 34 pg (ref 26.0–34.0)
MCHC: 33.7 g/dL (ref 30.0–36.0)
MCV: 100.9 fL — ABNORMAL HIGH (ref 80.0–100.0)
Platelets: 243 K/uL (ref 150–400)
RBC: 3.29 MIL/uL — ABNORMAL LOW (ref 3.87–5.11)
RDW: 13 % (ref 11.5–15.5)
WBC: 5.2 K/uL (ref 4.0–10.5)
nRBC: 0 % (ref 0.0–0.2)

## 2024-05-19 LAB — PHOSPHORUS: Phosphorus: 3.8 mg/dL (ref 2.5–4.6)

## 2024-05-19 SURGERY — EGD (ESOPHAGOGASTRODUODENOSCOPY)
Anesthesia: General

## 2024-05-19 MED ORDER — LACTATED RINGERS IV SOLN: INTRAVENOUS | Status: DC | PRN

## 2024-05-19 MED ORDER — STERILE WATER FOR IRRIGATION IR SOLN
Status: DC | PRN
  Administered 2024-05-19: 60 mL

## 2024-05-19 MED ORDER — LACTATED RINGERS IV SOLN: INTRAVENOUS | Status: DC

## 2024-05-19 MED ORDER — SODIUM CHLORIDE 0.9 % IV SOLN: INTRAVENOUS | Status: DC

## 2024-05-19 MED ORDER — PROPOFOL 10 MG/ML IV BOLUS
INTRAVENOUS | Status: DC | PRN
Start: 2024-05-19 — End: 2024-05-19
  Administered 2024-05-19: 100 mg via INTRAVENOUS
  Administered 2024-05-19: 150 ug/kg/min via INTRAVENOUS

## 2024-05-19 MED ORDER — LIDOCAINE HCL (CARDIAC) PF 100 MG/5ML IV SOSY
PREFILLED_SYRINGE | INTRAVENOUS | Status: DC | PRN
  Administered 2024-05-19: 100 mg via INTRAVENOUS

## 2024-05-19 MED ORDER — DICYCLOMINE HCL 10 MG PO CAPS
10.0000 mg | ORAL_CAPSULE | Freq: Three times a day (TID) | ORAL | Status: DC | PRN
  Administered 2024-05-19 – 2024-05-22 (×7): 10 mg via ORAL
  Filled 2024-05-19 (×7): qty 1

## 2024-05-19 NOTE — Plan of Care (Signed)

## 2024-05-19 NOTE — Progress Notes (Signed)

## 2024-05-19 NOTE — Anesthesia Preprocedure Evaluation (Addendum)
 Anesthesia Evaluation  Patient identified by MRN, date of birth, ID band Patient awake    Reviewed: Allergy & Precautions, H&P , NPO status , Patient's Chart, lab work & pertinent test results  History of Anesthesia Complications (+) PONV and history of anesthetic complications  Airway Mallampati: II  TM Distance: >3 FB Neck ROM: Full    Dental  (+) Edentulous Upper, Edentulous Lower   Pulmonary pneumonia, Current Smoker and Patient abstained from smoking.   Pulmonary exam normal breath sounds clear to auscultation       Cardiovascular hypertension, Normal cardiovascular exam Rhythm:Regular Rate:Normal     Neuro/Psych  PSYCHIATRIC DISORDERS Anxiety Depression    negative neurological ROS     GI/Hepatic Neg liver ROS,GERD  ,,  Endo/Other  negative endocrine ROS    Renal/GU negative Renal ROS  negative genitourinary   Musculoskeletal  (+) Arthritis ,    Abdominal   Peds negative pediatric ROS (+)  Hematology  (+) Blood dyscrasia, anemia   Anesthesia Other Findings   Reproductive/Obstetrics negative OB ROS                              Anesthesia Physical Anesthesia Plan  ASA: 3 and emergent  Anesthesia Plan: General   Post-op Pain Management:    Induction: Intravenous  PONV Risk Score and Plan:   Airway Management Planned: Nasal Cannula  Additional Equipment:   Intra-op Plan:   Post-operative Plan:   Informed Consent: I have reviewed the patients History and Physical, chart, labs and discussed the procedure including the risks, benefits and alternatives for the proposed anesthesia with the patient or authorized representative who has indicated his/her understanding and acceptance.     Dental advisory given  Plan Discussed with: CRNA  Anesthesia Plan Comments:          Anesthesia Quick Evaluation

## 2024-05-19 NOTE — Brief Op Note (Signed)
 05/19/2024  12:32 PM  PATIENT:  [name]  63 y.o. female  PRE-OPERATIVE DIAGNOSIS:  abdominal pain, vomiting  POST-OPERATIVE DIAGNOSIS:  gastritis, gastric sleeve, hiatal hernia 2cm,  PROCEDURE:  Procedure(s): EGD (ESOPHAGOGASTRODUODENOSCOPY) (N/A)  SURGEON:  Surgeons and Role:    * Eartha Angelia Sieving, MD - Primary  Patient underwent EGD under propofol  sedation.  Tolerated the procedure adequately.   FINDINGS: - Punctate white spotted mucosa in the esophagus.  Biopsied.  - A sleeve gastrectomy was found, characterized by healthy appearing mucosa.  - Gastritis.  Biopsied.  - Normal examined duodenum.  Biopsied.   RECOMMENDATIONS - Return patient to hospital ward for ongoing care.  - Advance diet as tolerated.  - Await pathology results.  - Use Protonix  (pantoprazole ) 40 mg PO BID.  - May take dicyclomine  10 mg as needed every 8 hours - Minimize opiate use.  Sieving Eartha, MD Gastroenterology and Hepatology [facility] Gastroenterology

## 2024-05-19 NOTE — Op Note (Addendum)
 [facility] Patient Name: [name] Procedure Date: 05/19/2024 11:51 AM MRN: [id number] Date of Birth: 10/07/1960 Attending MD: Toribio Fortune , , [id number] CSN: [id number] Age: 63 Admit Type: Inpatient Procedure:                Upper GI endoscopy Indications:              Generalized abdominal pain, Nausea with vomiting Providers:                Toribio Fortune, Devere Lodge, Dorcas Lenis,                            Technician Referring MD:              Medicines:                Monitored Anesthesia Care Complications:            No immediate complications. Estimated Blood Loss:     Estimated blood loss: none. Procedure:                Pre-Anesthesia Assessment:                           - Prior to the procedure, a History and Physical                            was performed, and patient medications, allergies                            and sensitivities were reviewed. The patient's                            tolerance of previous anesthesia was reviewed.                           - The risks and benefits of the procedure and the                            sedation options and risks were discussed with the                            patient. All questions were answered and informed                            consent was obtained.                           - ASA Grade Assessment: II - A patient with mild                            systemic disease.                           After obtaining informed consent, the endoscope was                            passed under direct vision. Throughout the  procedure, the patient's blood pressure, pulse, and                            oxygen saturations were monitored continuously. The                            [id number] ([id number]) Upper was introduced through                            the mouth, and advanced to the second part of                            duodenum. The upper GI endoscopy was  accomplished                            without difficulty. The patient tolerated the                            procedure well. Scope In: 12:11:32 PM Scope Out: 12:19:26 PM Total Procedure Duration: 0 hours 7 minutes 54 seconds  Findings:      Diffuse mucosal changes characterized by punctated white spots and       sloughing were found in the middle third of the esophagus and in the       lower third of the esophagus. Biopsies were taken with a cold forceps       for histology.      Evidence of a sleeve gastrectomy was found in the gastric body. This was       characterized by healthy appearing mucosa.      Diffuse moderate inflammation characterized by erythema was found in the       gastric antrum. Biopsies were taken with a cold forceps for Helicobacter       pylori testing.      The examined duodenum was normal. Biopsies were taken with a cold       forceps for histology. Impression:               - Punctate white spotted mucosa in the esophagus.                            Biopsied.                           - A sleeve gastrectomy was found, characterized by                            healthy appearing mucosa.                           - Gastritis. Biopsied.                           - Normal examined duodenum. Biopsied. Moderate Sedation:      Per Anesthesia Care Recommendation:           - Return patient to hospital ward for ongoing care.                           -  Advance diet as tolerated.                           - Await pathology results.                           - Use Protonix  (pantoprazole ) 40 mg PO BID.                           - May take dicyclomine  10 mg as needed every 8 hours                           - Minimize opiate use. Procedure Code(s):        --- Professional ---                           [phone], Esophagogastroduodenoscopy, flexible,                            transoral; with biopsy, single or multiple Diagnosis Code(s):        --- Professional ---                            K22.89, Other specified disease of esophagus                           Z98.84, Bariatric surgery status                           K29.70, Gastritis, unspecified, without bleeding                           R10.84, Generalized abdominal pain                           R11.2, Nausea with vomiting, unspecified CPT copyright 2022 [employer]. All rights reserved. The codes documented in this report are preliminary and upon coder review may  be revised to meet current compliance requirements. Toribio Fortune, MD Toribio Fortune,  05/19/2024 12:33:18 PM This report has been signed electronically. Number of Addenda: 0

## 2024-05-19 NOTE — Progress Notes (Signed)
 [facility] PROGRESS NOTE  [name] (DOB: [birth year]) FMW:[id number] PCP: Edman Meade PEDLAR, FNP  Brief Narrative: HPI: [name] is a 63 y.o. female with medical history significant for sleeve gastrectomy at [facility] 3 years back, GERD, vitamin D  deficiency, hypertension, chronic pain syndrome, hyperlipidemia, presented in the ED with complaints of abdominal pain associated with nausea and vomiting for last 3 days.  Patient reports undergoing sleeve gastrectomy 3 years back at [facility], since then she has been dealing intermittently with abdominal pain,  nausea and vomiting.  She has followed up with gastroenterology for dysphagia and she underwent EGD with dilatation of the esophageal stricture recently.  She is unable to take anything down for last 3 days, denies any fever, sick contacts or recent travel.   ED Course: She was hemodynamically stable. Temp 98.2, HR 71, RR 18, BP 117/72, SpO2 94% on room air Labs include sodium 140, potassium 2.7, chloride 103, bicarb 21, glucose 89, BUN 5, creatinine 0.52, calcium  8.8, anion gap 16, magnesium  1.7, alkaline phosphate 191, albumin 3.1, lipase 12, AST 258, ALT 98, total protein 7.1, total bilirubin 0.7, WBC 4.8, hemoglobin 12.8, hematocrit 36.7, platelet 281, acetaminophen  level less than 10,   CTA/P: No acute findings in the abdomen or pelvis. Hepatic steatosis. Non-obstructing right renal calculi, without  hydronephrosis. Moderate calcified aortoiliac atherosclerosis without aneurysm.Postsurgical changes of cholecystectomy, gastric sleeve, and hysterectomy.  Subjective: Patient trying to eat, no vomiting, but some cramping, ate mashed potatoes  Objective: BP 119/82 (BP Location: Right Arm)   Pulse 69   Temp 98.6 F (37 C) (Oral)   Resp 17   Ht 5' 2 (1.575 m)   Wt 83.5 kg   SpO2 96%   BMI 33.67 kg/m   Gen: No distress Pulm: Clear, nonlabored  CV: RRR, no MRG GI: Soft, NT, ND, +BS  Neuro: Alert and oriented. No new focal  deficits. Ext: Warm, no deformities. Skin: No rashes, lesions or ulcers on visualized skin   EGD 10/7 Dr. Eartha: FINDINGS: - Punctate white spotted mucosa in the esophagus.  Biopsied.  - A sleeve gastrectomy was found, characterized by healthy appearing mucosa.  - Gastritis.  Biopsied.  - Normal examined duodenum.  Biopsied.    RECOMMENDATIONS - Return patient to hospital ward for ongoing care.  - Advance diet as tolerated.  - Await pathology results.  - Use Protonix  (pantoprazole ) 40 mg PO BID.  - May take dicyclomine  10 mg as needed every 8 hours - Minimize opiate use.  Assessment & Plan: Intractable nausea and vomiting: Abdominal pain: - Await pathology results - Added dicyclomine  prn - Continue PPI BID (has been using this) - ADAT - Suggest gastroscintigraphy after discharge, perhaps empiric reglan   Hypokalemia: Replaced.  Continue to monitor   GERD: Continue pantoprazole    Hypertension: Hold blood pressure medication as blood pressure is on soft side.   Hyperlipidemia: Hold Lipitor due to elevated liver enzymes   Elevated liver enzymes: Continue to monitor LFTs.   Could be due to hepatic steatosis. Tylenol  level normal, acute hepatitis panel pending   [name], MD [facility] [email or web address] 05/19/2024, 5:45 PM

## 2024-05-19 NOTE — Anesthesia Postprocedure Evaluation (Signed)
 Anesthesia Post Note  Patient: Aava Deland  Procedure(s) Performed: EGD (ESOPHAGOGASTRODUODENOSCOPY)  Patient location during evaluation: PACU Anesthesia Type: General Level of consciousness: awake and alert Pain management: pain level controlled Vital Signs Assessment: post-procedure vital signs reviewed and stable Respiratory status: spontaneous breathing, nonlabored ventilation, respiratory function stable and patient connected to nasal cannula oxygen Cardiovascular status: blood pressure returned to baseline and stable Postop Assessment: no apparent nausea or vomiting Anesthetic complications: no   No notable events documented.   Last Vitals:  Vitals:   05/19/24 1223 05/19/24 1258  BP: 95/65 119/82  Pulse: (!) 103 69  Resp:  17  Temp: 37.1 C 37 C  SpO2: 97% 96%    Last Pain:  Vitals:   05/19/24 1258  TempSrc: Oral  PainSc:                  Andrea Limes

## 2024-05-19 NOTE — Transfer of Care (Signed)
 Immediate Anesthesia Transfer of Care Note  Patient: [name]  Procedure(s) Performed: EGD (ESOPHAGOGASTRODUODENOSCOPY)  Patient Location: PACU  Anesthesia Type:General  Level of Consciousness: awake, alert , oriented, and patient cooperative  Airway & Oxygen Therapy: Patient Spontanous Breathing  Post-op Assessment: Report given to RN and Post -op Vital signs reviewed and stable  Post vital signs: Reviewed and stable  Last Vitals:  Vitals Value Taken Time  BP 114/78   Temp 98.7   Pulse 112   Resp 18   SpO2 98     Last Pain:  Vitals:   05/19/24 1207  TempSrc:   PainSc: 9       Patients Stated Pain Goal: 5 (05/19/24 1102)  Complications: No notable events documented.

## 2024-05-20 ENCOUNTER — Inpatient Hospital Stay (HOSPITAL_COMMUNITY)

## 2024-05-20 ENCOUNTER — Ambulatory Visit (INDEPENDENT_AMBULATORY_CARE_PROVIDER_SITE_OTHER): Payer: Self-pay | Admitting: Gastroenterology

## 2024-05-20 DIAGNOSIS — R7989 Other specified abnormal findings of blood chemistry: Secondary | ICD-10-CM | POA: Diagnosis not present

## 2024-05-20 DIAGNOSIS — R112 Nausea with vomiting, unspecified: Secondary | ICD-10-CM | POA: Diagnosis not present

## 2024-05-20 DIAGNOSIS — R1013 Epigastric pain: Secondary | ICD-10-CM | POA: Diagnosis not present

## 2024-05-20 LAB — SURGICAL PATHOLOGY

## 2024-05-20 LAB — MISC LABCORP TEST (SEND OUT): Labcorp test code: 83935

## 2024-05-20 MED ORDER — ORAL CARE MOUTH RINSE: 15.0000 mL | OROMUCOSAL | Status: DC | PRN

## 2024-05-20 NOTE — Progress Notes (Signed)
 [facility] PROGRESS NOTE  Huma Imhoff (DOB: [birth year]) FMW:[id number] PCP: Edman Meade PEDLAR, FNP  Brief Narrative: HPI: [name] is a 63 y.o. female with medical history significant for sleeve gastrectomy at [facility] 3 years back, GERD, vitamin D  deficiency, hypertension, chronic pain syndrome, hyperlipidemia, presented in the ED with complaints of abdominal pain associated with nausea and vomiting for last 3 days.  Patient reports undergoing sleeve gastrectomy 3 years back at [facility], since then she has been dealing intermittently with abdominal pain,  nausea and vomiting.  She has followed up with gastroenterology for dysphagia and she underwent EGD with dilatation of the esophageal stricture recently.  She is unable to take anything down for last 3 days, denies any fever, sick contacts or recent travel.   ED Course: She was hemodynamically stable. Temp 98.2, HR 71, RR 18, BP 117/72, SpO2 94% on room air Labs include sodium 140, potassium 2.7, chloride 103, bicarb 21, glucose 89, BUN 5, creatinine 0.52, calcium  8.8, anion gap 16, magnesium  1.7, alkaline phosphate 191, albumin 3.1, lipase 12, AST 258, ALT 98, total protein 7.1, total bilirubin 0.7, WBC 4.8, hemoglobin 12.8, hematocrit 36.7, platelet 281, acetaminophen  level less than 10,   CTA/P: No acute findings in the abdomen or pelvis. Hepatic steatosis. Non-obstructing right renal calculi, without  hydronephrosis. Moderate calcified aortoiliac atherosclerosis without aneurysm.Postsurgical changes of cholecystectomy, gastric sleeve, and hysterectomy.  Subjective: Still vomiting, still 9/10 abdominal pain  Objective: BP 105/60 (BP Location: Left Arm)   Pulse 64   Temp 98.7 F (37.1 C) (Oral)   Resp 16   Ht 5' 2 (1.575 m)   Wt 83.5 kg   SpO2 96%   BMI 33.67 kg/m   Gen: No distress Pulm: Clear, nonlabored  CV: RRR, no MRG GI: Diffusely tender mostly in upper quadrants, no rebound, +BS Neuro: Alert and oriented. No new  focal deficits. Ext: Warm, no deformities. Skin: No rashes, lesions or ulcers on visualized skin    EGD 10/7 Dr. Eartha: FINDINGS: - Punctate white spotted mucosa in the esophagus.  Biopsied.  - A sleeve gastrectomy was found, characterized by healthy appearing mucosa.  - Gastritis.  Biopsied.  - Normal examined duodenum.  Biopsied.    RECOMMENDATIONS - Return patient to hospital ward for ongoing care.  - Advance diet as tolerated.  - Await pathology results.  - Use Protonix  (pantoprazole ) 40 mg PO BID.  - May take dicyclomine  10 mg as needed every 8 hours - Minimize opiate use.  Assessment & Plan: Intractable nausea and vomiting: Abdominal pain: Unexplained by EGD.  - Await pathology results - Added dicyclomine  prn - Continue PPI BID (has been using this) - ADAT, still vomiting, continue IV meds/IVF - MRCP ordered per GI to r/o de novo cholelithiasis. Could consider gastroscintigraphy after discharge, perhaps empiric reglan - Discussed at length need to decrease narcotics    Hypokalemia: Replaced.  Continue to monitor   GERD: Continue pantoprazole , f/u gastritis biopsy   Hypertension: Hold blood pressure medication as blood pressure is on soft side.   Hyperlipidemia: Hold Lipitor due to elevated liver enzymes   Elevated liver enzymes: Continue to monitor LFTs.   Could be due to hepatic steatosis. Tylenol  level normal, acute hepatitis panel pending   [name], MD [facility] [email or web address] 05/20/2024, 7:07 PM

## 2024-05-20 NOTE — Plan of Care (Signed)

## 2024-05-20 NOTE — Plan of Care (Signed)

## 2024-05-20 NOTE — Progress Notes (Signed)
 Gastroenterology Progress Note   Referring Provider: No ref. provider found Primary Care Physician:  Edman Meade PEDLAR, FNP Primary Gastroenterologist:  Dr. Victory Brand, [facility]  Patient ID: [name]; [id number]; [birth year]   Subjective:    Ate very little this morning. She has been eating soup for months, scared to eat solid food. She had 1/2 pancake and had only little pain.  She reports her epigastric pain is postprandial and sharp. Not cramping and not related to BMs. She has not had any vomiting but has had some nausea. Last BM yesterday.  Objective:   Vital signs in last 24 hours: Temp:  [98.3 F (36.8 C)-99 F (37.2 C)] 98.7 F (37.1 C) (10/09 1245) Pulse Rate:  [62-71] 64 (10/09 1245) Resp:  [14-18] 16 (10/09 1245) BP: (94-105)/(46-66) 105/60 (10/09 1245) SpO2:  [93 %-98 %] 96 % (10/09 1245) Last BM Date : 05/19/24 General:   Alert,  Well-developed, well-nourished, pleasant and cooperative in NAD Head:  Normocephalic and atraumatic. Eyes:  Sclera clear, no icterus.   Abdomen:  Soft, nontender and nondistended.  Normal bowel sounds, without guarding, and without rebound.   Extremities:  Without clubbing, deformity or edema. Neurologic:  Alert and  oriented x4;  grossly normal neurologically. Skin:  Intact without significant lesions or rashes. Psych:  Alert and cooperative. Normal mood and affect.  Intake/Output from previous day: 10/08 0701 - 10/09 0700 In: 480 [P.O.:480] Out: -  Intake/Output this shift: No intake/output data recorded.  Lab Results: CBC Recent Labs    05/18/24 0826 05/19/24 0459  WBC 4.8 5.2  HGB 12.8 11.2*  HCT 36.7 33.2*  MCV 97.3 100.9*  PLT 281 243   BMET Recent Labs    05/18/24 0826 05/19/24 0459  NA 140 138  K 2.7* 3.6  CL 103 105  CO2 21* 21*  GLUCOSE 89 76  BUN <5* <5*  CREATININE 0.42* 0.48  CALCIUM  8.8* 8.3*   LFTs Recent Labs    05/18/24 0826 05/19/24 0459  BILITOT 0.7 0.9  ALKPHOS 191* 170*   AST 258* 234*  ALT 98* 93*  PROT 7.1 6.2*  ALBUMIN 3.1* 2.8*   Recent Labs    05/18/24 0826  LIPASE 12   PT/INR No results for input(s): LABPROT, INR in the last 72 hours.       Imaging Studies: CT ABDOMEN PELVIS W CONTRAST Result Date: 05/18/2024 EXAM: CT ABDOMEN AND PELVIS WITH CONTRAST 05/18/2024 10:30:38 AM TECHNIQUE: CT of the abdomen and pelvis was performed with the administration of 100 mL of iohexol  (OMNIPAQUE ) 300 MG/ML solution. Multiplanar reformatted images are provided for review. Automated exposure control, iterative reconstruction, and/or weight-based adjustment of the mA/kV was utilized to reduce the radiation dose to as low as reasonably achievable. COMPARISON: MR 03/11/2024 and previous. CLINICAL HISTORY: Abdominal pain, acute, nonlocalized. Pt c/o abdominal pain with vomiting for over a month. Pt states she was admitted over a month ago to [facility] where they diagnosed her bypass hole was closing up and stretched it but symptoms have not improved. Pt states pain is worse today. FINDINGS: LOWER CHEST: Chronic linear scarring/atelectasis in the lung bases. LIVER: Fatty liver. GALLBLADDER AND BILE DUCTS: Cholecystectomy clips. No biliary ductal dilatation. SPLEEN: No acute abnormality. PANCREAS: No acute abnormality. ADRENAL GLANDS: No acute abnormality. KIDNEYS, URETERS AND BLADDER: 2.1 cm 7 hu left upper pole renal cortical cyst as before. Right urolithiasis largest 3 mm peripherally in the upper pole collecting system. No hydronephrosis. Urinary bladder nondistended. No perinephric or  periureteral stranding. Per consensus, no follow-up is needed for simple Bosniak type 1 and 2 renal cysts, unless the patient has a malignancy history or risk factors. GI AND BOWEL: Post gastric sleeve surgery. Stomach demonstrates no acute abnormality. There is no bowel obstruction. PERITONEUM AND RETROPERITONEUM: No ascites. No free air. VASCULATURE: Moderate calcified aortoiliac atheromatous  plaque without aneurysm. Aorta is normal in caliber. LYMPH NODES: No lymphadenopathy. REPRODUCTIVE ORGANS: Post hysterectomy. BONES AND SOFT TISSUES: Mild lumbar spondylitic changes L4-5 with suspected protrusion. Stable heterogeneous sclerosis in the left iliac wing and proximal femur since 2022. No acute osseous abnormality. No focal soft tissue abnormality. IMPRESSION: 1. No acute findings in the abdomen or pelvis. 2. Hepatic steatosis. 3. Nonobstructing right renal calculi, without  hydronephrosis. 4. Moderate calcified aortoiliac atherosclerosis without aneurysm. 5. Postsurgical changes of cholecystectomy, gastric sleeve, and hysterectomy. 6. Mild lumbar spondylosis at L4-L5 with suspected disc protrusion. 7. Chronic scarring or atelectasis at the lung bases. 8. Stable heterogeneous sclerosis in the left iliac wing and proximal femur since 2022. Electronically signed by: Dayne Hassell MD 05/18/2024 11:32 AM EDT RP Workstation: [id number]  [7 weeks]  Assessment:   63 y/o female patient of Dr. Victory Brand with [facility], with pulmonary sarcoidosis, hypertension, hyperlipidemia, GERD, depression, anxiety, previous gastric sleeve (2020), hepatomegaly with hepatic steatosis, history of etoh abuse presenting with complaints of epigastric pain, N/V ongoing for 5 months now. She was noted to have elevated LFTs.   Epigastric pain, N/V: Etiology not clear. Ongoing symptoms for 5 months, daily. Occurring with soups and medications now. UGI initially suggested distal esophageal stricture and possible small gastric antral ulcer as well as moderate esophageal dysmotility with tertiary contractions. EGD with esophageal dilation of mild Schatzki ring did not help. She was noted to have a tortuous esophagus and gastritis but no ulcer. CTs also with some suggestion of mild peripancreatic stranding with possible acute interstitial pancreatitis in 10/2023 but follow up MRI abd with unremarkable pancreas. Current CT without  explanation for symptoms. She admits to prior heavy etoh use up until 3 years ago but had 5-6 shots of liquor at party 2 weeks ago.   Her symptoms may be multifactorial. Cannot rule out pancreatitis given previous CT findings and mildly elevated lipase in the past. Symptoms could be exacerbated by esophageal motility disorder. Cannot rule out gastroparesis. It is not clear if the cause of her elevated LFTs are tied to her abdominal pain and vomiting.    EGD 05/19/24: punctate white spotted mucosa in the middle third of the esophagus and in the lower third of the esophagus s/p bx (focal mild reactive changes and focal bacteria on luminal surface). Evidence of gastric sleeve (healthy mucosa), diffuse moderate inflammation in gastric antrum, s/p bx (neg h.pylori). Small bowel biopsy negative.  Elevated LFTs: Intermittent elevation, normal in 10/2023. Now with AST two times ALT which could be due to etoh hepatitis. AP also elevated. Liver enlarged and fatty on imaging. She denies herbal or other otc supplements, illicit drug use, energy drinks, recent tattoos. Would recommend trending LFTs and if persistently elevated, would complete serologic work up. Limited work up now including iron/ferritin/acute hepatitis. Ferritin and iron sat elevated, suspicious for significant etoh use but need to rule out hemochromatosis.   -Tbili 0.7-->0.9 -AP 191-->170 -AST 258-->234 -ALT 98-->93 -Hep B surf Ag, HCV Ab, Hep B C IgM, Hep A IgM all NR -Ferritin 491 -iron 104, TIBC 186, iron sat 56   Plan:   Continue soft diet. Continue PPI  BID Hemochromatosis genetic markers Patient declines trial of reglan. Repeat LFTs, PT/INR tomorrow or next week (with [facility]) if discharged before   LOS: 2 days   Sonny RAMAN. Ezzard RIGGERS [facility] [phone] 10/9/202512:58 PM

## 2024-05-21 DIAGNOSIS — R112 Nausea with vomiting, unspecified: Secondary | ICD-10-CM | POA: Diagnosis not present

## 2024-05-21 LAB — COMPREHENSIVE METABOLIC PANEL WITH GFR
ALT: 66 U/L — ABNORMAL HIGH (ref 0–44)
AST: 120 U/L — ABNORMAL HIGH (ref 15–41)
Albumin: 2.6 g/dL — ABNORMAL LOW (ref 3.5–5.0)
Alkaline Phosphatase: 161 U/L — ABNORMAL HIGH (ref 38–126)
Anion gap: 10 (ref 5–15)
BUN: <5 mg/dL — ABNORMAL LOW (ref 8–23)
CO2: 22 mmol/L (ref 22–32)
Calcium: 8.4 mg/dL — ABNORMAL LOW (ref 8.9–10.3)
Chloride: 104 mmol/L (ref 98–111)
Creatinine, Ser: 0.4 mg/dL — ABNORMAL LOW (ref 0.44–1.00)
GFR, Estimated: >60 mL/min (ref >60)
Glucose, Bld: 79 mg/dL (ref 70–99)
Potassium: 3.6 mmol/L (ref 3.5–5.1)
Sodium: 137 mmol/L (ref 135–145)
Total Bilirubin: 0.9 mg/dL (ref 0.0–1.2)
Total Protein: 6.2 g/dL — ABNORMAL LOW (ref 6.5–8.1)

## 2024-05-21 LAB — CBC
HCT: 31.3 % — ABNORMAL LOW (ref 36.0–46.0)
Hemoglobin: 10.9 g/dL — ABNORMAL LOW (ref 12.0–15.0)
MCH: 34.2 pg — ABNORMAL HIGH (ref 26.0–34.0)
MCHC: 34.8 g/dL (ref 30.0–36.0)
MCV: 98.1 fL (ref 80.0–100.0)
Platelets: 245 K/uL (ref 150–400)
RBC: 3.19 MIL/uL — ABNORMAL LOW (ref 3.87–5.11)
RDW: 12.9 % (ref 11.5–15.5)
WBC: 4.4 K/uL (ref 4.0–10.5)
nRBC: 0 % (ref 0.0–0.2)

## 2024-05-21 LAB — PROTIME-INR
INR: 1 (ref 0.8–1.2)
Prothrombin Time: 14.2 s (ref 11.4–15.2)

## 2024-05-21 MED ORDER — DEXTROSE-SODIUM CHLORIDE 5-0.45 % IV SOLN: INTRAVENOUS | Status: AC

## 2024-05-21 NOTE — Plan of Care (Signed)
  Problem: Education: Goal: Knowledge of General Education information will improve Description: Including pain rating scale, medication(s)/side effects and non-pharmacologic comfort measures Outcome: Progressing   Problem: Activity: Goal: Risk for activity intolerance will decrease Outcome: Not Met (add Reason)   Problem: Nutrition: Goal: Adequate nutrition will be maintained Outcome: Progressing   Problem: Elimination: Goal: Will not experience complications related to bowel motility Outcome: Not Met (add Reason)   Problem: Pain Managment: Goal: General experience of comfort will improve and/or be controlled Outcome: Not Met (add Reason)

## 2024-05-21 NOTE — Progress Notes (Addendum)
 [facility] PROGRESS NOTE  [name] (DOB: 05-01-1961) FMW:[id number] PCP: Edman Meade PEDLAR, FNP  Brief Narrative: HPI: [name] is a 63 y.o. female with medical history significant for sleeve gastrectomy at [facility] 3 years back, GERD, vitamin D  deficiency, hypertension, chronic pain syndrome, hyperlipidemia, presented in the ED with complaints of abdominal pain associated with nausea and vomiting for last 3 days.  Patient reports undergoing sleeve gastrectomy 3 years back at [facility], since then she has been dealing intermittently with abdominal pain,  nausea and vomiting.  She has followed up with gastroenterology for dysphagia and she underwent EGD with dilatation of the esophageal stricture recently.  She is unable to take anything down for last 3 days, denies any fever, sick contacts or recent travel.   ED Course: She was hemodynamically stable. Temp 98.2, HR 71, RR 18, BP 117/72, SpO2 94% on room air Labs include sodium 140, potassium 2.7, chloride 103, bicarb 21, glucose 89, BUN 5, creatinine 0.52, calcium  8.8, anion gap 16, magnesium  1.7, alkaline phosphate 191, albumin 3.1, lipase 12, AST 258, ALT 98, total protein 7.1, total bilirubin 0.7, WBC 4.8, hemoglobin 12.8, hematocrit 36.7, platelet 281, acetaminophen  level less than 10,   CTA/P: No acute findings in the abdomen or pelvis. Hepatic steatosis. Non-obstructing right renal calculi, without  hydronephrosis. Moderate calcified aortoiliac atherosclerosis without aneurysm.Postsurgical changes of cholecystectomy, gastric sleeve, and hysterectomy.  Subjective: Says it hurts when she tries to take po and her nausea is severe, very limited po intake.   Objective: BP 107/66 (BP Location: Left Arm)   Pulse 70   Temp 98.3 F (36.8 C) (Oral)   Resp 16   Ht 5' 2 (1.575 m)   Wt 83.5 kg   SpO2 98%   BMI 33.67 kg/m   Gen: No distress Pulm: Clear, nonlabored  CV: RRR, no MRG GI: Soft, tender without rebound mostly in  epigastrium, +BS Neuro: Alert and oriented. No new focal deficits. Ext: Warm, no deformities. Skin: No rashes, lesions or ulcers on visualized skin   EGD 10/7 Dr. Eartha: FINDINGS: - Punctate white spotted mucosa in the esophagus.  Biopsied.  - A sleeve gastrectomy was found, characterized by healthy appearing mucosa.  - Gastritis.  Biopsied.  - Normal examined duodenum.  Biopsied.    RECOMMENDATIONS - Return patient to hospital ward for ongoing care.  - Advance diet as tolerated.  - Await pathology results.  - Use Protonix  (pantoprazole ) 40 mg PO BID.  - May take dicyclomine  10 mg as needed every 8 hours - Minimize opiate use.  Assessment & Plan: Intractable nausea and vomiting: Abdominal pain: Unexplained by EGD.  - Await pathology results - Added dicyclomine  prn - Continue PPI BID (has been using this) - ADAT, still vomiting, continue IV meds/IVF - MRCP ordered per GI to r/o de novo cholelithiasis. Could consider gastroscintigraphy after discharge, perhaps empiric reglan. Pt has nose ring but had MRI earlier this year. - Discussed at length need to decrease narcotics    Hypokalemia: Replaced.  Continue to monitor   GERD: Continue pantoprazole , f/u gastritis biopsy   Hypertension: Normotensive   Hyperlipidemia: Holding Lipitor due to elevated liver enzymes (improving)   Elevated liver enzymes: Continue to monitor LFTs.   Could be due to hepatic steatosis. - Further serology/lab work up is pending.   [name], MD [facility] [email or web address] 05/21/2024, 1:08 PM

## 2024-05-21 NOTE — Plan of Care (Signed)
   Problem: Activity: Goal: Risk for activity intolerance will decrease Outcome: Progressing   Problem: Coping: Goal: Level of anxiety will decrease Outcome: Progressing

## 2024-05-21 NOTE — Care Management Important Message (Signed)
 Important Message  Patient Details  Name: [name] MRN: [id number] Date of Birth: Jul 03, 1961   Important Message Given:  Yes - Medicare IM     [name] L [name] 05/21/2024, 4:17 PM

## 2024-05-22 DIAGNOSIS — R112 Nausea with vomiting, unspecified: Secondary | ICD-10-CM | POA: Diagnosis not present

## 2024-05-22 LAB — COMPREHENSIVE METABOLIC PANEL WITH GFR
ALT: 60 U/L — ABNORMAL HIGH (ref 0–44)
AST: 115 U/L — ABNORMAL HIGH (ref 15–41)
Albumin: 2.8 g/dL — ABNORMAL LOW (ref 3.5–5.0)
Alkaline Phosphatase: 157 U/L — ABNORMAL HIGH (ref 38–126)
Anion gap: 10 (ref 5–15)
BUN: <5 mg/dL — ABNORMAL LOW (ref 8–23)
CO2: 21 mmol/L — ABNORMAL LOW (ref 22–32)
Calcium: 8.7 mg/dL — ABNORMAL LOW (ref 8.9–10.3)
Chloride: 106 mmol/L (ref 98–111)
Creatinine, Ser: 0.34 mg/dL — ABNORMAL LOW (ref 0.44–1.00)
GFR, Estimated: >60 mL/min (ref >60)
Glucose, Bld: 93 mg/dL (ref 70–99)
Potassium: 3.3 mmol/L — ABNORMAL LOW (ref 3.5–5.1)
Sodium: 138 mmol/L (ref 135–145)
Total Bilirubin: 0.8 mg/dL (ref 0.0–1.2)
Total Protein: 6.2 g/dL — ABNORMAL LOW (ref 6.5–8.1)

## 2024-05-22 LAB — IGG, IGA, IGM
IgA: 228 mg/dL (ref 87–352)
IgG (Immunoglobin G), Serum: 1978 mg/dL — ABNORMAL HIGH (ref 586–1602)
IgM (Immunoglobulin M), Srm: 143 mg/dL (ref 26–217)

## 2024-05-22 MED ORDER — PROCHLORPERAZINE MALEATE 10 MG PO TABS: 10.0000 mg | ORAL_TABLET | Freq: Four times a day (QID) | ORAL | 0 refills | Status: AC | PRN

## 2024-05-22 MED ORDER — DOCUSATE SODIUM 100 MG PO CAPS: 100.0000 mg | ORAL_CAPSULE | Freq: Two times a day (BID) | ORAL | 0 refills | Status: AC

## 2024-05-22 MED ORDER — PANTOPRAZOLE SODIUM 40 MG PO TBEC: 40.0000 mg | DELAYED_RELEASE_TABLET | Freq: Two times a day (BID) | ORAL | 0 refills | Status: AC

## 2024-05-22 MED ORDER — POTASSIUM CHLORIDE ER 10 MEQ PO TBCR: 10.0000 meq | EXTENDED_RELEASE_TABLET | Freq: Two times a day (BID) | ORAL | 0 refills | Status: AC

## 2024-05-22 MED ORDER — DICYCLOMINE HCL 10 MG PO CAPS: 10.0000 mg | ORAL_CAPSULE | Freq: Three times a day (TID) | ORAL | 0 refills | Status: AC | PRN

## 2024-05-22 NOTE — Discharge Summary (Signed)
 Physician Discharge Summary   Patient: [name] MRN: [id number] DOB: [birth year]  Admit date:     05/18/2024  Discharge date: 05/22/24  Discharge Physician: [name]   PCP: Edman Meade PEDLAR, FNP   Recommendations at discharge:  Follow up with GI after discharge, pt established with [facility], Dr. Legrand.  Follow up with PCP in 1-2 weeks.   Discharge Diagnoses: Principal Problem:   Intractable nausea and vomiting Active Problems:   SARCOIDOSIS, PULMONARY   GASTROESOPHAGEAL REFLUX DISEASE   Constipation   Obesity   Hypertension   Vitamin D  deficiency   Pain of upper abdomen  Hospital Course: HPI: [name] is a 63 y.o. female with medical history significant for sleeve gastrectomy at [facility] 3 years back, GERD, vitamin D  deficiency, hypertension, chronic pain syndrome, hyperlipidemia, presented in the ED with complaints of abdominal pain associated with nausea and vomiting for last 3 days.  Patient reports undergoing sleeve gastrectomy 3 years back at [facility], since then she has been dealing intermittently with abdominal pain,  nausea and vomiting.  She has followed up with gastroenterology for dysphagia and she underwent EGD with dilatation of the esophageal stricture recently.  She is unable to take anything down for last 3 days, denies any fever, sick contacts or recent travel.   ED Course: She was hemodynamically stable. Temp 98.2, HR 71, RR 18, BP 117/72, SpO2 94% on room air Labs include sodium 140, potassium 2.7, chloride 103, bicarb 21, glucose 89, BUN 5, creatinine 0.52, calcium  8.8, anion gap 16, magnesium  1.7, alkaline phosphate 191, albumin 3.1, lipase 12, AST 258, ALT 98, total protein 7.1, total bilirubin 0.7, WBC 4.8, hemoglobin 12.8, hematocrit 36.7, platelet 281, acetaminophen  level less than 10,   CTA/P: No acute findings in the abdomen or pelvis. Hepatic steatosis. Non-obstructing right renal calculi, without  hydronephrosis. Moderate calcified aortoiliac  atherosclerosis without aneurysm.Postsurgical changes of cholecystectomy, gastric sleeve, and hysterectomy.***  EGD 10/7 Dr. Eartha: FINDINGS: - Punctate white spotted mucosa in the esophagus.  Biopsied.  - A sleeve gastrectomy was found, characterized by healthy appearing mucosa.  - Gastritis.  Biopsied.  - Normal examined duodenum.  Biopsied.    RECOMMENDATIONS - Return patient to hospital ward for ongoing care.  - Advance diet as tolerated.  - Await pathology results.  - Use Protonix  (pantoprazole ) 40 mg PO BID.  - May take dicyclomine  10 mg as needed every 8 hours - Minimize opiate use.  Assessment and Plan: Intractable nausea and vomiting: Abdominal pain: Unexplained by EGD.  - Await pathology results - Added dicyclomine  prn - Continue PPI BID (has been using this) - ADAT, still vomiting, continue IV meds/IVF - MRCP ordered per GI to r/o de novo cholelithiasis. Could consider gastroscintigraphy after discharge, perhaps empiric reglan. Pt has nose ring but had MRI earlier this year. - Discussed at length need to decrease narcotics    Hypokalemia: Replaced.  Continue to monitor   GERD: Continue pantoprazole , f/u gastritis biopsy   Hypertension: Normotensive   Hyperlipidemia: Holding Lipitor due to elevated liver enzymes (improving)   Elevated liver enzymes: Continue to monitor LFTs.   Could be due to hepatic steatosis. - Further serology/lab work up is pending.  Consultants: *** Procedures performed: ***  Disposition: {Plan; Disposition:26390} Diet recommendation:  {Diet_Plan:26776} DISCHARGE MEDICATION: Allergies as of 05/22/2024       Reactions   Aspirin Hives     Med Rec must be completed prior to using this [facility]***       Discharge  Exam: Filed Weights   05/18/24 0818 05/19/24 1102  Weight: 83.5 kg 83.5 kg   ***  Condition at discharge: {DC Condition:26389}  The results of significant diagnostics from this hospitalization (including  imaging, microbiology, ancillary and laboratory) are listed below for reference.   Imaging Studies: CT ABDOMEN PELVIS W CONTRAST Result Date: 05/18/2024 EXAM: CT ABDOMEN AND PELVIS WITH CONTRAST 05/18/2024 10:30:38 AM TECHNIQUE: CT of the abdomen and pelvis was performed with the administration of 100 mL of iohexol  (OMNIPAQUE ) 300 MG/ML solution. Multiplanar reformatted images are provided for review. Automated exposure control, iterative reconstruction, and/or weight-based adjustment of the mA/kV was utilized to reduce the radiation dose to as low as reasonably achievable. COMPARISON: MR 03/11/2024 and previous. CLINICAL HISTORY: Abdominal pain, acute, nonlocalized. Pt c/o abdominal pain with vomiting for over a month. Pt states she was admitted over a month ago to [facility] where they diagnosed her bypass hole was closing up and stretched it but symptoms have not improved. Pt states pain is worse today. FINDINGS: LOWER CHEST: Chronic linear scarring/atelectasis in the lung bases. LIVER: Fatty liver. GALLBLADDER AND BILE DUCTS: Cholecystectomy clips. No biliary ductal dilatation. SPLEEN: No acute abnormality. PANCREAS: No acute abnormality. ADRENAL GLANDS: No acute abnormality. KIDNEYS, URETERS AND BLADDER: 2.1 cm 7 hu left upper pole renal cortical cyst as before. Right urolithiasis largest 3 mm peripherally in the upper pole collecting system. No hydronephrosis. Urinary bladder nondistended. No perinephric or periureteral stranding. Per consensus, no follow-up is needed for simple Bosniak type 1 and 2 renal cysts, unless the patient has a malignancy history or risk factors. GI AND BOWEL: Post gastric sleeve surgery. Stomach demonstrates no acute abnormality. There is no bowel obstruction. PERITONEUM AND RETROPERITONEUM: No ascites. No free air. VASCULATURE: Moderate calcified aortoiliac atheromatous plaque without aneurysm. Aorta is normal in caliber. LYMPH NODES: No lymphadenopathy. REPRODUCTIVE ORGANS: Post  hysterectomy. BONES AND SOFT TISSUES: Mild lumbar spondylitic changes L4-5 with suspected protrusion. Stable heterogeneous sclerosis in the left iliac wing and proximal femur since 2022. No acute osseous abnormality. No focal soft tissue abnormality. IMPRESSION: 1. No acute findings in the abdomen or pelvis. 2. Hepatic steatosis. 3. Nonobstructing right renal calculi, without  hydronephrosis. 4. Moderate calcified aortoiliac atherosclerosis without aneurysm. 5. Postsurgical changes of cholecystectomy, gastric sleeve, and hysterectomy. 6. Mild lumbar spondylosis at L4-L5 with suspected disc protrusion. 7. Chronic scarring or atelectasis at the lung bases. 8. Stable heterogeneous sclerosis in the left iliac wing and proximal femur since 2022. Electronically signed by: Dayne Hassell MD 05/18/2024 11:32 AM EDT RP Workstation: [id number]    Microbiology: Results for orders placed or performed in visit on 08/20/22  COVID-19, Flu A+B and RSV     Status: Abnormal   Collection Time: 08/20/22  9:55 AM   Specimen: Nasal Swab  Result Value Ref Range Status   SARS-CoV-2, NAA Detected (A) Not Detected Final    Comment: Patients who have a positive COVID-19 test result may now have treatment options. Treatment options are available for patients with mild to moderate symptoms and for hospitalized patients. Visit our website at [email or web address] for resources and information.    Influenza A, NAA Not Detected Not Detected Final   Influenza B, NAA Not Detected Not Detected Final   RSV, NAA Not Detected Not Detected Final   Test Information: Comment  Final    Comment: This nucleic acid amplification test was developed and its performance characteristics determined by [employer]. Nucleic acid amplification tests include RT-PCR and TMA. This test has not  been FDA cleared or approved. This test has been authorized by FDA under an Emergency Use Authorization (EUA). This test is only  authorized for the duration of time the declaration that circumstances exist justifying the authorization of the emergency use of in vitro diagnostic tests for detection of SARS-CoV-2 virus and/or diagnosis of COVID-19 infection under section 564(b)(1) of the Act, 21 U.S.C. 639aaa-6(a) (1), unless the authorization is terminated or revoked sooner. When diagnostic testing is negative, the possibility of a false negative result should be considered in the context of a patient's recent exposures and the presence of clinical signs and symptoms consistent with COVID-19. An individual without symptoms of COVID-19 and who is not shedding SARS-CoV-2 virus wo uld expect to have a negative (not detected) result in this assay.     Labs: CBC: Recent Labs  Lab 05/18/24 0826 05/19/24 0459 05/21/24 0515  WBC 4.8 5.2 4.4  HGB 12.8 11.2* 10.9*  HCT 36.7 33.2* 31.3*  MCV 97.3 100.9* 98.1  PLT 281 243 245   Basic Metabolic Panel: Recent Labs  Lab 05/18/24 0826 05/19/24 0459 05/21/24 0515 05/22/24 0811  NA 140 138 137 138  K 2.7* 3.6 3.6 3.3*  CL 103 105 104 106  CO2 21* 21* 22 21*  GLUCOSE 89 76 79 93  BUN <5* <5* <5* <5*  CREATININE 0.42* 0.48 0.40* 0.34*  CALCIUM  8.8* 8.3* 8.4* 8.7*  MG 1.7 1.7  --   --   PHOS  --  3.8  --   --    Liver Function Tests: Recent Labs  Lab 05/18/24 0826 05/19/24 0459 05/21/24 0515 05/22/24 0811  AST 258* 234* 120* 115*  ALT 98* 93* 66* 60*  ALKPHOS 191* 170* 161* 157*  BILITOT 0.7 0.9 0.9 0.8  PROT 7.1 6.2* 6.2* 6.2*  ALBUMIN 3.1* 2.8* 2.6* 2.8*   CBG: No results for input(s): GLUCAP in the last 168 hours.  Discharge time spent: {LESS THAN/GREATER UYJW:73611} 30 minutes.  Signed: [name], MD [facility] 05/22/2024

## 2024-05-23 LAB — ANTI-SMOOTH MUSCLE ANTIBODY, IGG: F-Actin IgG: 4 U (ref 0–19)

## 2024-05-23 LAB — MITOCHONDRIAL ANTIBODIES: Mitochondrial M2 Ab, IgG: <20 U (ref 0.0–20.0)

## 2024-05-24 ENCOUNTER — Encounter (HOSPITAL_COMMUNITY): Payer: Self-pay | Admitting: Gastroenterology

## 2024-05-24 LAB — ANTINUCLEAR ANTIBODIES, IFA: ANA Ab, IFA: NEGATIVE

## 2024-05-26 ENCOUNTER — Encounter (INDEPENDENT_AMBULATORY_CARE_PROVIDER_SITE_OTHER): Payer: Self-pay | Admitting: Gastroenterology

## 2024-05-27 LAB — HEMOCHROMATOSIS DNA-PCR(C282Y,H63D)

## 2024-05-28 NOTE — Progress Notes (Signed)
 Patient result letter mailed

## 2024-06-09 ENCOUNTER — Other Ambulatory Visit: Payer: Self-pay | Admitting: Family Medicine

## 2024-06-09 DIAGNOSIS — Z1231 Encounter for screening mammogram for malignant neoplasm of breast: Secondary | ICD-10-CM

## 2025-01-04 ENCOUNTER — Ambulatory Visit

## 2025-01-05 ENCOUNTER — Ambulatory Visit
